# Patient Record
Sex: Male | Born: 1955 | Race: White | Hispanic: No | Smoking: Former smoker
Health system: Southern US, Community
[De-identification: ages and names within clinical notes are randomized; demographics above are authoritative.]

## PROBLEM LIST (undated history)

## (undated) DIAGNOSIS — F1020 Alcohol dependence, uncomplicated: Secondary | ICD-10-CM

## (undated) DIAGNOSIS — R0600 Dyspnea, unspecified: Secondary | ICD-10-CM

## (undated) DIAGNOSIS — J189 Pneumonia, unspecified organism: Secondary | ICD-10-CM

## (undated) DIAGNOSIS — I10 Essential (primary) hypertension: Secondary | ICD-10-CM

## (undated) DIAGNOSIS — J9811 Atelectasis: Secondary | ICD-10-CM

## (undated) DIAGNOSIS — J449 Chronic obstructive pulmonary disease, unspecified: Secondary | ICD-10-CM

## (undated) DIAGNOSIS — Z972 Presence of dental prosthetic device (complete) (partial): Secondary | ICD-10-CM

## (undated) DIAGNOSIS — D649 Anemia, unspecified: Secondary | ICD-10-CM

## (undated) DIAGNOSIS — R590 Localized enlarged lymph nodes: Secondary | ICD-10-CM

## (undated) DIAGNOSIS — F32A Depression, unspecified: Secondary | ICD-10-CM

## (undated) DIAGNOSIS — C801 Malignant (primary) neoplasm, unspecified: Secondary | ICD-10-CM

## (undated) DIAGNOSIS — A150 Tuberculosis of lung: Secondary | ICD-10-CM

## (undated) DIAGNOSIS — Z8489 Family history of other specified conditions: Secondary | ICD-10-CM

## (undated) HISTORY — PX: MULTIPLE TOOTH EXTRACTIONS: SHX2053

---

## 2010-08-19 ENCOUNTER — Encounter: Payer: Self-pay | Admitting: Gastroenterology

## 2010-11-29 NOTE — Letter (Signed)
Summary: TRIAGE  TRIAGE   Imported By: Rexene Alberts 08/19/2010 10:04:21  _____________________________________________________________________  External Attachment:    Type:   Image     Comment:   External Document

## 2014-09-30 ENCOUNTER — Emergency Department (HOSPITAL_COMMUNITY): Payer: Medicare Other

## 2014-09-30 ENCOUNTER — Encounter (HOSPITAL_COMMUNITY): Payer: Self-pay

## 2014-09-30 ENCOUNTER — Emergency Department (HOSPITAL_COMMUNITY)
Admission: EM | Admit: 2014-09-30 | Discharge: 2014-09-30 | Disposition: A | Discharge status: Home / Self Care | Payer: Medicare Other | Attending: Emergency Medicine | Admitting: Emergency Medicine

## 2014-09-30 DIAGNOSIS — Z720 Tobacco use: Secondary | ICD-10-CM | POA: Diagnosis not present

## 2014-09-30 DIAGNOSIS — R55 Syncope and collapse: Secondary | ICD-10-CM | POA: Diagnosis not present

## 2014-09-30 DIAGNOSIS — J441 Chronic obstructive pulmonary disease with (acute) exacerbation: Secondary | ICD-10-CM | POA: Diagnosis not present

## 2014-09-30 DIAGNOSIS — R109 Unspecified abdominal pain: Secondary | ICD-10-CM | POA: Insufficient documentation

## 2014-09-30 DIAGNOSIS — R05 Cough: Secondary | ICD-10-CM

## 2014-09-30 DIAGNOSIS — R054 Cough syncope: Secondary | ICD-10-CM

## 2014-09-30 DIAGNOSIS — Z8611 Personal history of tuberculosis: Secondary | ICD-10-CM | POA: Diagnosis not present

## 2014-09-30 DIAGNOSIS — I10 Essential (primary) hypertension: Secondary | ICD-10-CM | POA: Diagnosis not present

## 2014-09-30 DIAGNOSIS — K625 Hemorrhage of anus and rectum: Secondary | ICD-10-CM | POA: Diagnosis not present

## 2014-09-30 HISTORY — DX: Tuberculosis of lung: A15.0

## 2014-09-30 HISTORY — DX: Essential (primary) hypertension: I10

## 2014-09-30 HISTORY — DX: Chronic obstructive pulmonary disease, unspecified: J44.9

## 2014-09-30 LAB — CBC WITH DIFFERENTIAL/PLATELET
BASOS ABS: 0 10*3/uL (ref 0.0–0.1)
Basophils Relative: 1 % (ref 0–1)
EOS PCT: 1 % (ref 0–5)
Eosinophils Absolute: 0.1 10*3/uL (ref 0.0–0.7)
HEMATOCRIT: 42.4 % (ref 39.0–52.0)
HEMOGLOBIN: 15.4 g/dL (ref 13.0–17.0)
Lymphocytes Relative: 42 % (ref 12–46)
Lymphs Abs: 3.5 10*3/uL (ref 0.7–4.0)
MCH: 34.8 pg — ABNORMAL HIGH (ref 26.0–34.0)
MCHC: 36.3 g/dL — AB (ref 30.0–36.0)
MCV: 95.7 fL (ref 78.0–100.0)
MONO ABS: 0.8 10*3/uL (ref 0.1–1.0)
MONOS PCT: 10 % (ref 3–12)
NEUTROS ABS: 3.9 10*3/uL (ref 1.7–7.7)
Neutrophils Relative %: 46 % (ref 43–77)
Platelets: 141 10*3/uL — ABNORMAL LOW (ref 150–400)
RBC: 4.43 MIL/uL (ref 4.22–5.81)
RDW: 12.1 % (ref 11.5–15.5)
WBC: 8.3 10*3/uL (ref 4.0–10.5)

## 2014-09-30 LAB — URINALYSIS, ROUTINE W REFLEX MICROSCOPIC
Bilirubin Urine: NEGATIVE
Glucose, UA: NEGATIVE mg/dL
Hgb urine dipstick: NEGATIVE
KETONES UR: NEGATIVE mg/dL
LEUKOCYTES UA: NEGATIVE
NITRITE: NEGATIVE
Protein, ur: NEGATIVE mg/dL
Specific Gravity, Urine: <1.005 — ABNORMAL LOW (ref 1.005–1.030)
UROBILINOGEN UA: 0.2 mg/dL (ref 0.0–1.0)
pH: 5.5 (ref 5.0–8.0)

## 2014-09-30 LAB — COMPREHENSIVE METABOLIC PANEL
ALBUMIN: 3.7 g/dL (ref 3.5–5.2)
ALT: 36 U/L (ref 0–53)
ANION GAP: 16 — AB (ref 5–15)
AST: 63 U/L — ABNORMAL HIGH (ref 0–37)
Alkaline Phosphatase: 92 U/L (ref 39–117)
BILIRUBIN TOTAL: 0.4 mg/dL (ref 0.3–1.2)
BUN: 4 mg/dL — AB (ref 6–23)
CALCIUM: 9 mg/dL (ref 8.4–10.5)
CHLORIDE: 89 meq/L — AB (ref 96–112)
CO2: 23 mEq/L (ref 19–32)
CREATININE: 0.83 mg/dL (ref 0.50–1.35)
GFR calc Af Amer: >90 mL/min (ref >90)
GFR calc non Af Amer: >90 mL/min (ref >90)
Glucose, Bld: 125 mg/dL — ABNORMAL HIGH (ref 70–99)
Potassium: 3 mEq/L — ABNORMAL LOW (ref 3.7–5.3)
Sodium: 128 mEq/L — ABNORMAL LOW (ref 137–147)
Total Protein: 7.5 g/dL (ref 6.0–8.3)

## 2014-09-30 LAB — LACTIC ACID, PLASMA: Lactic Acid, Venous: 3.3 mmol/L — ABNORMAL HIGH (ref 0.5–2.2)

## 2014-09-30 LAB — TROPONIN I

## 2014-09-30 MED ORDER — ALBUTEROL SULFATE (2.5 MG/3ML) 0.083% IN NEBU
5.0000 mg | INHALATION_SOLUTION | Freq: Once | RESPIRATORY_TRACT | Status: AC
Start: 2014-09-30 — End: 2014-09-30
  Administered 2014-09-30: 5 mg via RESPIRATORY_TRACT
  Filled 2014-09-30: qty 6

## 2014-09-30 MED ORDER — PREDNISONE 50 MG PO TABS
60.0000 mg | ORAL_TABLET | ORAL | Status: AC
  Administered 2014-09-30: 60 mg via ORAL
  Filled 2014-09-30 (×2): qty 1

## 2014-09-30 MED ORDER — SODIUM CHLORIDE 0.9 % IV SOLN
1000.0000 mL | INTRAVENOUS | Status: DC
  Administered 2014-09-30: 1000 mL via INTRAVENOUS

## 2014-09-30 MED ORDER — PREDNISONE 20 MG PO TABS: 60.0000 mg | ORAL_TABLET | Freq: Every day | ORAL | Status: AC

## 2014-09-30 NOTE — ED Notes (Signed)
He was at the kitchen table, and he tried to stand up and he blacked out per family. We took him to [facility] and it was a long wait, they did do an EKG because he was having chest pain and it was fine. He is also having blood in his stool per family.

## 2014-09-30 NOTE — ED Provider Notes (Signed)
CSN: [id number]     Arrival date & time 09/30/14  1918 History  This chart was scribed for Carmin Muskrat, MD by Tula Nakayama, ED Scribe. This patient was seen in room APA16A/APA16A and the patient's care was started at 8:00 PM.    Chief Complaint  Patient presents with  . Chest Pain  . Loss of Consciousness   Patient is a 58 y.o. male presenting with syncope. The history is provided by the patient. No language interpreter was used.  Loss of Consciousness Associated symptoms: no nausea and no vomiting     HPI Comments: Ronald [name] is a 58 y.o. male who presents to the Emergency Department complaining of chest pain, syncope, abdominal pain, rectal bleeding. Patient has multiple medical issues, including history of TB in the distant past, COPD, current smoking, substantial current alcohol use. He states over the past 5 months or so, he has had multiple episodes of sustained coughing, occasionally resulting with syncope. The patient had a similar episode, with no pain either before or after the event, but with chest pain after an additional coughing episode that occurred after losing consciousness. Currently the patient has no pain, coughing, lightheadedness, nausea. He does complain of suprapubic pain. Patient notes over the past few days he has had suprapubic discomfort, sore, moderate.  In addition, the patient has new bright red blood per rectum, with bowel movements, but not independently. No rectal pain, no vomiting. I discussed smoking cessation with the patient.    Past Medical History  Diagnosis Date  . COPD (chronic obstructive pulmonary disease)   . Hypertension   . TB (pulmonary tuberculosis)    No past surgical history on file. No family history on file. History  Substance Use Topics  . Smoking status: Current Every Day Smoker  . Smokeless tobacco: Not on file  . Alcohol Use: Yes    Review of Systems  Constitutional:       Per HPI, otherwise negative  HENT:        Per HPI, otherwise negative  Respiratory:       Per HPI, otherwise negative  Cardiovascular: Positive for syncope.       Per HPI, otherwise negative  Gastrointestinal: Positive for abdominal pain and anal bleeding. Negative for nausea and vomiting.  Endocrine:       Negative aside from HPI  Genitourinary:       Neg aside from HPI   Musculoskeletal:       Per HPI, otherwise negative  Skin: Negative.   Neurological: Positive for syncope.      Allergies  Review of patient's allergies indicates no known allergies.  Home Medications   Prior to Admission medications   Not on File   BP 106/49 mmHg  Pulse 90  Temp(Src) 97.7 F (36.5 C) (Oral)  Resp 20  Ht 5\' 4"  (1.626 m)  Wt 155 lb (70.308 kg)  BMI 26.59 kg/m2  SpO2 100% Physical Exam  Constitutional: He is oriented to person, place, and time. He appears well-developed.  Large, generally ill-appearing male  HENT:  Head: Normocephalic and atraumatic.  Eyes: Conjunctivae and EOM are normal.  Cardiovascular: Normal rate and regular rhythm.   Pulmonary/Chest: No stridor. Tachypnea noted. No respiratory distress. He has decreased breath sounds. He has wheezes.  Abdominal: He exhibits no distension. There is no hepatosplenomegaly. There is tenderness in the suprapubic area. There is guarding. There is no rigidity.  Musculoskeletal: He exhibits no edema.  Neurological: He is alert and oriented to  person, place, and time.  Skin: Skin is warm and dry.  Psychiatric: He has a normal mood and affect.  Nursing note and vitals reviewed.   ED Course  Procedures (including critical care time)  COORDINATION OF CARE: 8:00 PM Discussed treatment plan with pt at bedside and pt agreed to plan.    Labs Review Labs Reviewed  CBC WITH DIFFERENTIAL - Abnormal; Notable for the following:    MCH 34.8 (*)    MCHC 36.3 (*)    Platelets 141 (*)    All other components within normal limits  COMPREHENSIVE METABOLIC PANEL - Abnormal;  Notable for the following:    Sodium 128 (*)    Potassium 3.0 (*)    Chloride 89 (*)    Glucose, Bld 125 (*)    BUN 4 (*)    AST 63 (*)    Anion gap 16 (*)    All other components within normal limits  URINALYSIS, ROUTINE W REFLEX MICROSCOPIC - Abnormal; Notable for the following:    Specific Gravity, Urine <1.005 (*)    All other components within normal limits  LACTIC ACID, PLASMA - Abnormal; Notable for the following:    Lactic Acid, Venous 3.3 (*)    All other components within normal limits  TROPONIN I    Imaging Review Ct Abdomen Pelvis Wo Contrast  09/30/2014   CLINICAL DATA:  Acute onset of lower abdominal pain and suprapubic pain. Bright red blood per rectum. Syncope. Initial encounter.  EXAM: CT ABDOMEN AND PELVIS WITHOUT CONTRAST  TECHNIQUE: Multidetector CT imaging of the abdomen and pelvis was performed following the standard protocol without IV contrast.  COMPARISON:  None.  FINDINGS: A tiny 4 mm nodule is noted at the right lung base (image 9 of 21).  There is diffuse fatty infiltration within the liver. The spleen is unremarkable in appearance. The gallbladder is grossly unremarkable. The pancreas and adrenal glands are normal in appearance.  Nonspecific perinephric stranding is noted bilaterally. A 2.9 cm cyst is noted near the lower pole of the left kidney. There is no evidence of hydronephrosis. No renal or ureteral stones are seen.  No free fluid is identified. The small bowel is unremarkable in appearance. The stomach is within normal limits. No acute vascular abnormalities are seen. Scattered calcification is seen along the abdominal aorta and its branches.  The appendix is normal in caliber and contains air, without evidence for appendicitis. Contrast progresses to the level of the mid transverse colon. The colon is otherwise largely decompressed and unremarkable in appearance.  The bladder is moderately distended and grossly unremarkable. The prostate remains normal in  size, with scattered calcification. No inguinal lymphadenopathy is seen.  No acute osseous abnormalities are identified.  IMPRESSION: 1. No definite acute abnormality seen to explain the patient's symptoms. The colon is largely decompressed and grossly unremarkable in appearance. 2. Nonspecific perinephric stranding noted bilaterally. Depending on the patient's symptoms, pyelonephritis could have such an appearance, or this could reflect chronic change. 3. Diffuse fatty infiltration within the liver. 4. Scattered calcification along the abdominal aorta and its branches. 5. Tiny 4 mm nodule at the right lung base. If the patient is at high risk for bronchogenic carcinoma, follow-up chest CT at 1 year is recommended. If the patient is at low risk, no follow-up is needed. This recommendation follows the consensus statement: Guidelines for Management of Small Pulmonary Nodules Detected on CT Scans: A Statement from the [employer] as published in Radiology 2005; 237:395-400.  Electronically Signed   By: Garald Balding M.D.   On: 09/30/2014 22:39   Dg Chest 2 View  09/30/2014   CLINICAL DATA:  Chest pain, syncope  EXAM: CHEST  2 VIEW  COMPARISON:  06/24/2013  FINDINGS: Cardiomediastinal silhouette is stable. No acute infiltrate or pleural effusion. No pulmonary edema. Hyperinflation again noted. Bilateral nodular nipple shadow again noted.  IMPRESSION: No active cardiopulmonary disease.  Hyperinflation.   Electronically Signed   By: Lahoma Crocker M.D.   On: 09/30/2014 21:28     EKG Interpretation   Date/Time:  Wednesday September 30 2014 19:33:13 EST Ventricular Rate:  88 PR Interval:  216 QRS Duration: 100 QT Interval:  376 QTC Calculation: 454 R Axis:   88 Text Interpretation:  Sinus rhythm with 1st degree A-V block prominent p  waves Abnormal ekg Sinus rhythm with 1st degree A-V block Otherwise normal  ECG Confirmed by Carmin Muskrat  MD ([id number]) on 09/30/2014 8:10:20 PM     Pulse ox symmetry  96% with room air, borderline   11:01 PM Patient awake and alert, feeling better. He is aware of all results, and will call PMD tomorrow.  MDM   Final diagnoses:  Abdominal pain  syncope Cough  Patient presents after an episode of cough associated syncope. Notably, the patient has had multiple similar events for months Patient continues to smoke, in spite of his history of COPD. Patient's labs are largely reassuring, with no evidence of coronary ischemia, nor arrhythmia. Patient does describe some rectal bleeding, though has minimal abdominal pain, and CT scan is largely reassuring. With no anemia, and substantial improvement with fluid resuscitation, patient was discharged to follow up with his primary care team.   I personally performed the services described in this documentation, which was scribed in my presence. The recorded information has been reviewed and is accurate.      Carmin Muskrat, MD 09/30/14 2303

## 2014-09-30 NOTE — Discharge Instructions (Signed)
As discussed, it is important that you follow up as soon as possible with your physician for continued management of your condition. ° °If you develop any new, or concerning changes in your condition, please return to the emergency department immediately. ° °

## 2014-09-30 NOTE — ED Notes (Signed)
Patient states he had "a coughing spell" tonight and became dizzy and passed out. Patient also states rectal bleeding, nausea and vomiting, and lower abdominal pain.

## 2014-09-30 NOTE — ED Notes (Signed)
Patient and family verbalize understanding of discharge instructions, prescription medications, home care and follow up care. Patient ambulatory out of department at this time with children.

## 2018-09-19 ENCOUNTER — Emergency Department (HOSPITAL_COMMUNITY): Payer: Medicare Other

## 2018-09-19 ENCOUNTER — Inpatient Hospital Stay (HOSPITAL_COMMUNITY)
Admission: EM | Admit: 2018-09-19 | Discharge: 2018-09-24 | DRG: 871 | Disposition: A | Discharge status: Home Health | Payer: Medicare Other | Attending: Internal Medicine | Admitting: Internal Medicine

## 2018-09-19 ENCOUNTER — Encounter (HOSPITAL_COMMUNITY): Payer: Self-pay | Admitting: Emergency Medicine

## 2018-09-19 ENCOUNTER — Other Ambulatory Visit: Payer: Self-pay

## 2018-09-19 DIAGNOSIS — R918 Other nonspecific abnormal finding of lung field: Secondary | ICD-10-CM | POA: Diagnosis not present

## 2018-09-19 DIAGNOSIS — J189 Pneumonia, unspecified organism: Secondary | ICD-10-CM | POA: Diagnosis not present

## 2018-09-19 DIAGNOSIS — E871 Hypo-osmolality and hyponatremia: Secondary | ICD-10-CM | POA: Diagnosis present

## 2018-09-19 DIAGNOSIS — Z8249 Family history of ischemic heart disease and other diseases of the circulatory system: Secondary | ICD-10-CM

## 2018-09-19 DIAGNOSIS — Z8041 Family history of malignant neoplasm of ovary: Secondary | ICD-10-CM | POA: Diagnosis not present

## 2018-09-19 DIAGNOSIS — Z23 Encounter for immunization: Secondary | ICD-10-CM | POA: Diagnosis present

## 2018-09-19 DIAGNOSIS — R627 Adult failure to thrive: Secondary | ICD-10-CM | POA: Diagnosis present

## 2018-09-19 DIAGNOSIS — R109 Unspecified abdominal pain: Secondary | ICD-10-CM

## 2018-09-19 DIAGNOSIS — E876 Hypokalemia: Secondary | ICD-10-CM | POA: Diagnosis not present

## 2018-09-19 DIAGNOSIS — J181 Lobar pneumonia, unspecified organism: Secondary | ICD-10-CM

## 2018-09-19 DIAGNOSIS — C3432 Malignant neoplasm of lower lobe, left bronchus or lung: Secondary | ICD-10-CM | POA: Diagnosis present

## 2018-09-19 DIAGNOSIS — Z993 Dependence on wheelchair: Secondary | ICD-10-CM | POA: Diagnosis not present

## 2018-09-19 DIAGNOSIS — D649 Anemia, unspecified: Secondary | ICD-10-CM | POA: Diagnosis present

## 2018-09-19 DIAGNOSIS — D638 Anemia in other chronic diseases classified elsewhere: Secondary | ICD-10-CM | POA: Diagnosis present

## 2018-09-19 DIAGNOSIS — R0602 Shortness of breath: Secondary | ICD-10-CM | POA: Diagnosis not present

## 2018-09-19 DIAGNOSIS — E43 Unspecified severe protein-calorie malnutrition: Secondary | ICD-10-CM

## 2018-09-19 DIAGNOSIS — I5032 Chronic diastolic (congestive) heart failure: Secondary | ICD-10-CM | POA: Diagnosis present

## 2018-09-19 DIAGNOSIS — K5900 Constipation, unspecified: Secondary | ICD-10-CM | POA: Diagnosis not present

## 2018-09-19 DIAGNOSIS — Z7289 Other problems related to lifestyle: Secondary | ICD-10-CM | POA: Diagnosis not present

## 2018-09-19 DIAGNOSIS — F1020 Alcohol dependence, uncomplicated: Secondary | ICD-10-CM | POA: Diagnosis present

## 2018-09-19 DIAGNOSIS — I110 Hypertensive heart disease with heart failure: Secondary | ICD-10-CM | POA: Diagnosis present

## 2018-09-19 DIAGNOSIS — Z8611 Personal history of tuberculosis: Secondary | ICD-10-CM | POA: Diagnosis not present

## 2018-09-19 DIAGNOSIS — J449 Chronic obstructive pulmonary disease, unspecified: Secondary | ICD-10-CM | POA: Diagnosis present

## 2018-09-19 DIAGNOSIS — R6521 Severe sepsis with septic shock: Secondary | ICD-10-CM | POA: Diagnosis present

## 2018-09-19 DIAGNOSIS — Z6821 Body mass index (BMI) 21.0-21.9, adult: Secondary | ICD-10-CM | POA: Diagnosis not present

## 2018-09-19 DIAGNOSIS — I951 Orthostatic hypotension: Secondary | ICD-10-CM | POA: Diagnosis not present

## 2018-09-19 DIAGNOSIS — R634 Abnormal weight loss: Secondary | ICD-10-CM | POA: Diagnosis not present

## 2018-09-19 DIAGNOSIS — R945 Abnormal results of liver function studies: Secondary | ICD-10-CM | POA: Diagnosis present

## 2018-09-19 DIAGNOSIS — R9431 Abnormal electrocardiogram [ECG] [EKG]: Secondary | ICD-10-CM | POA: Diagnosis present

## 2018-09-19 DIAGNOSIS — E222 Syndrome of inappropriate secretion of antidiuretic hormone: Secondary | ICD-10-CM | POA: Diagnosis present

## 2018-09-19 DIAGNOSIS — I10 Essential (primary) hypertension: Secondary | ICD-10-CM | POA: Diagnosis present

## 2018-09-19 DIAGNOSIS — F17210 Nicotine dependence, cigarettes, uncomplicated: Secondary | ICD-10-CM | POA: Diagnosis present

## 2018-09-19 DIAGNOSIS — J9811 Atelectasis: Secondary | ICD-10-CM | POA: Diagnosis not present

## 2018-09-19 DIAGNOSIS — A419 Sepsis, unspecified organism: Principal | ICD-10-CM | POA: Diagnosis present

## 2018-09-19 DIAGNOSIS — Z681 Body mass index (BMI) 19 or less, adult: Secondary | ICD-10-CM | POA: Diagnosis not present

## 2018-09-19 DIAGNOSIS — F17200 Nicotine dependence, unspecified, uncomplicated: Secondary | ICD-10-CM | POA: Diagnosis not present

## 2018-09-19 DIAGNOSIS — R7989 Other specified abnormal findings of blood chemistry: Secondary | ICD-10-CM | POA: Diagnosis present

## 2018-09-19 DIAGNOSIS — I2510 Atherosclerotic heart disease of native coronary artery without angina pectoris: Secondary | ICD-10-CM

## 2018-09-19 DIAGNOSIS — R531 Weakness: Secondary | ICD-10-CM | POA: Diagnosis present

## 2018-09-19 DIAGNOSIS — J440 Chronic obstructive pulmonary disease with acute lower respiratory infection: Secondary | ICD-10-CM | POA: Diagnosis present

## 2018-09-19 HISTORY — DX: Alcohol dependence, uncomplicated: F10.20

## 2018-09-19 LAB — CBC WITH DIFFERENTIAL/PLATELET
Abs Immature Granulocytes: 0.28 10*3/uL — ABNORMAL HIGH (ref 0.00–0.07)
BASOS ABS: 0.1 10*3/uL (ref 0.0–0.1)
BASOS PCT: 0 %
EOS ABS: 0 10*3/uL (ref 0.0–0.5)
Eosinophils Relative: 0 %
HCT: 36.2 % — ABNORMAL LOW (ref 39.0–52.0)
Hemoglobin: 12.1 g/dL — ABNORMAL LOW (ref 13.0–17.0)
IMMATURE GRANULOCYTES: 1 %
Lymphocytes Relative: 4 %
Lymphs Abs: 1 10*3/uL (ref 0.7–4.0)
MCH: 32.1 pg (ref 26.0–34.0)
MCHC: 33.4 g/dL (ref 30.0–36.0)
MCV: 96 fL (ref 80.0–100.0)
Monocytes Absolute: 1.3 10*3/uL — ABNORMAL HIGH (ref 0.1–1.0)
Monocytes Relative: 5 %
NEUTROS PCT: 90 %
Neutro Abs: 23.8 10*3/uL — ABNORMAL HIGH (ref 1.7–7.7)
PLATELETS: 307 10*3/uL (ref 150–400)
RBC: 3.77 MIL/uL — AB (ref 4.22–5.81)
RDW: 12.5 % (ref 11.5–15.5)
WBC: 26.5 10*3/uL — AB (ref 4.0–10.5)
nRBC: 0 % (ref 0.0–0.2)

## 2018-09-19 LAB — URINALYSIS, ROUTINE W REFLEX MICROSCOPIC
Glucose, UA: NEGATIVE mg/dL
HGB URINE DIPSTICK: NEGATIVE
Ketones, ur: NEGATIVE mg/dL
Leukocytes, UA: NEGATIVE
NITRITE: NEGATIVE
PH: 6 (ref 5.0–8.0)
Protein, ur: 30 mg/dL — AB
SPECIFIC GRAVITY, URINE: 1.02 (ref 1.005–1.030)

## 2018-09-19 LAB — COMPREHENSIVE METABOLIC PANEL
ALBUMIN: 2.2 g/dL — AB (ref 3.5–5.0)
ALK PHOS: 124 U/L (ref 38–126)
ALT: 54 U/L — ABNORMAL HIGH (ref 0–44)
ANION GAP: 11 (ref 5–15)
AST: 75 U/L — ABNORMAL HIGH (ref 15–41)
BUN: 8 mg/dL (ref 8–23)
CALCIUM: 8.6 mg/dL — AB (ref 8.9–10.3)
CO2: 26 mmol/L (ref 22–32)
Chloride: 93 mmol/L — ABNORMAL LOW (ref 98–111)
Creatinine, Ser: 0.78 mg/dL (ref 0.61–1.24)
GFR calc Af Amer: >60 mL/min (ref >60)
GFR calc non Af Amer: >60 mL/min (ref >60)
GLUCOSE: 176 mg/dL — AB (ref 70–99)
POTASSIUM: 3.6 mmol/L (ref 3.5–5.1)
Sodium: 130 mmol/L — ABNORMAL LOW (ref 135–145)
TOTAL PROTEIN: 6.9 g/dL (ref 6.5–8.1)
Total Bilirubin: 0.7 mg/dL (ref 0.3–1.2)

## 2018-09-19 LAB — MAGNESIUM: Magnesium: 2 mg/dL (ref 1.7–2.4)

## 2018-09-19 LAB — BRAIN NATRIURETIC PEPTIDE: B NATRIURETIC PEPTIDE 5: 77 pg/mL (ref 0.0–100.0)

## 2018-09-19 LAB — ETHANOL: Alcohol, Ethyl (B): <10 mg/dL (ref <10)

## 2018-09-19 LAB — PHOSPHORUS: PHOSPHORUS: 3.1 mg/dL (ref 2.5–4.6)

## 2018-09-19 LAB — TSH: TSH: 1.488 u[IU]/mL (ref 0.350–4.500)

## 2018-09-19 LAB — LACTIC ACID, PLASMA: Lactic Acid, Venous: 1.1 mmol/L (ref 0.5–1.9)

## 2018-09-19 LAB — LIPASE, BLOOD: Lipase: 25 U/L (ref 11–51)

## 2018-09-19 MED ORDER — ONDANSETRON HCL 4 MG PO TABS
4.0000 mg | ORAL_TABLET | Freq: Four times a day (QID) | ORAL | Status: DC | PRN
  Administered 2018-09-21: 4 mg via ORAL
  Filled 2018-09-19: qty 1

## 2018-09-19 MED ORDER — SODIUM CHLORIDE 0.9 % IV SOLN
INTRAVENOUS | Status: DC
  Administered 2018-09-19 – 2018-09-21 (×4): via INTRAVENOUS

## 2018-09-19 MED ORDER — HEPARIN SODIUM (PORCINE) 5000 UNIT/ML IJ SOLN
5000.0000 [IU] | Freq: Three times a day (TID) | INTRAMUSCULAR | Status: DC
  Administered 2018-09-19 – 2018-09-24 (×14): 5000 [IU] via SUBCUTANEOUS
  Filled 2018-09-19 (×15): qty 1

## 2018-09-19 MED ORDER — SODIUM CHLORIDE 0.9 % IV BOLUS
1000.0000 mL | Freq: Once | INTRAVENOUS | Status: AC
  Administered 2018-09-19: 1000 mL via INTRAVENOUS

## 2018-09-19 MED ORDER — SODIUM CHLORIDE 0.9 % IV SOLN
1.0000 g | INTRAVENOUS | Status: DC
  Administered 2018-09-20 – 2018-09-23 (×4): 1 g via INTRAVENOUS
  Filled 2018-09-19 (×4): qty 10

## 2018-09-19 MED ORDER — SODIUM CHLORIDE 0.9 % IV SOLN
1.0000 g | Freq: Once | INTRAVENOUS | Status: AC
  Administered 2018-09-19: 1 g via INTRAVENOUS
  Filled 2018-09-19: qty 10

## 2018-09-19 MED ORDER — ACETAMINOPHEN 325 MG PO TABS
650.0000 mg | ORAL_TABLET | Freq: Four times a day (QID) | ORAL | Status: DC | PRN
  Administered 2018-09-20: 650 mg via ORAL
  Filled 2018-09-19: qty 2

## 2018-09-19 MED ORDER — POTASSIUM CHLORIDE IN NACL 20-0.9 MEQ/L-% IV SOLN
INTRAVENOUS | Status: DC
  Administered 2018-09-19: 23:00:00 via INTRAVENOUS
  Filled 2018-09-19: qty 1000

## 2018-09-19 MED ORDER — IOHEXOL 300 MG/ML  SOLN
75.0000 mL | Freq: Once | INTRAMUSCULAR | Status: AC | PRN
  Administered 2018-09-19: 75 mL via INTRAVENOUS

## 2018-09-19 MED ORDER — ONDANSETRON HCL 4 MG/2ML IJ SOLN: 4.0000 mg | Freq: Four times a day (QID) | INTRAMUSCULAR | Status: DC | PRN

## 2018-09-19 MED ORDER — OXYCODONE HCL 5 MG PO TABS
5.0000 mg | ORAL_TABLET | Freq: Four times a day (QID) | ORAL | Status: DC | PRN
  Administered 2018-09-20 – 2018-09-21 (×2): 5 mg via ORAL
  Filled 2018-09-19 (×2): qty 1

## 2018-09-19 MED ORDER — SODIUM CHLORIDE 0.9 % IV SOLN
500.0000 mg | Freq: Once | INTRAVENOUS | Status: AC
  Administered 2018-09-19: 500 mg via INTRAVENOUS
  Filled 2018-09-19: qty 500

## 2018-09-19 MED ORDER — ACETAMINOPHEN 650 MG RE SUPP: 650.0000 mg | Freq: Four times a day (QID) | RECTAL | Status: DC | PRN

## 2018-09-19 MED ORDER — SODIUM CHLORIDE 0.9 % IV SOLN
500.0000 mg | INTRAVENOUS | Status: DC
  Administered 2018-09-20 – 2018-09-21 (×2): 500 mg via INTRAVENOUS
  Filled 2018-09-19 (×2): qty 500

## 2018-09-19 NOTE — ED Triage Notes (Signed)
Patient states for the past couple of weeks when he stands up his "feet give out on me and I fall in the floor on my butt."  Patient states his home nurse reported orthostatic hypotension.

## 2018-09-19 NOTE — H&P (Signed)
History and Physical    Ronald [name]:[id number] DOB: 09-Oct-1956 DOA: 09/19/2018  PCP: Patient, No Pcp Per   Patient coming from: Home.  I have personally briefly reviewed patient's old medical records in [place]  Chief Complaint: Weakness and weight loss.  HPI: Ronald [name] is a 62 y.o. male with medical history significant of alcoholism, COPD, hypertension, pulmonary TB, cigarette smoker since his teenage years who is coming to the emergency department with progressively worse weakness and lightheadedness for the past 2 weeks associated with a 30 pound weight loss in the last 2 months.  He complains of fatigue and frequent wheezing.  He has productive cough for yellowish sputum.  He denies fever, chills, sore throat, rhinorrhea or hemoptysis.  He denies chest pain, palpitations, diaphoresis, PND, orthopnea or pitting edema of the lower extremities.  No abdominal pain, nausea, emesis, diarrhea, constipation, melena or hematochezia.  Denies dysuria, frequency or hematuria.  Denies heat or cold intolerance.  Denies polyuria, polydipsia, polyphagia or blurred vision.  ED Course: Initial vital signs temperature 97.6 F, pulse 123, respirations 20, blood pressure 99/55 mmHg and O2 sat 95% on room air.  The patient received supplemental oxygen, 3000 mL of NS bolus, 20 mEq of KCl with 1 of the boluses, ceftriaxone, Zithromax IV.  His urinalysis show an amber color, hazy appearance, small bilirubin, 30 mg/dL proteinuria, rare bacteria and 6-10 WBC per hpf.  White count was 26.5 on his CBC with differential 90% neutrophils, 4% lymphocytes and 5% monocytes.  Hemoglobin was 12.1 g/dL and platelets 307.  BMP, lipase, alcohol level, TSH, magnesium, phosphorus and lactic acid were within normal limits.  His sodium was 130, potassium 3.6, chloride 93 and CO2 26 mmol/L.  BUN 8, creatinine 0.78, calcium 8.6 and glucose 176 mg/dL.  Total protein is 6.9 and albumin 2.2 g/dL.  AST 75 ALT 54.  The rest of  the CMP is within normal limits.  Imaging: Chest radiograph and CT chest with contrast showed obstructing pneumonia and mass on LLL.  Please see images and full radiology report for further detail.  Review of Systems: As per HPI otherwise 10 point review of systems negative.   Past Medical History:  Diagnosis Date  . Alcoholism ([place])   . COPD (chronic obstructive pulmonary disease) ([place])   . Hypertension   . TB (pulmonary tuberculosis)     History reviewed. No pertinent surgical history.   reports that he has been smoking cigarettes. He has been smoking about 0.50 packs per day. He has never used smokeless tobacco. He reports that he drinks about 7.0 standard drinks of alcohol per week. He reports that he does not use drugs.  No Known Allergies  Family History  Problem Relation Age of Onset  . Heart disease Mother   . Heart disease Father   . Diabetes Other   . Hypertension Other    family history is also significant for ovarian cancer on his maternal grandmother and one of his sister.  Prior to Admission medications   Not on File    Physical Exam: Vitals:   09/19/18 1900 09/19/18 1930 09/19/18 2000 09/19/18 2015  BP: (!) 91/57 96/64 98/68    Pulse: 97 100  (!) 105  Resp: (!) 21 (!) 22 (!) 31 (!) 26  Temp:      TempSrc:      SpO2: 98% 98%  97%  Weight:      Height:        Constitutional: Looks chronically  ill.  Cachectic. Eyes: PERRL, lids and conjunctivae are mildly injected. ENMT: Mucous membranes are dry.  Posterior pharynx clear of any exudate or lesions.Normal dentition.  Neck: normal, supple, no masses, no thyromegaly Respiratory: Decreased breath sounds on LLL, mild wheezing bilaterally, LLL crackles. Normal respiratory effort. No accessory muscle use.  Cardiovascular: Tachycardic at 102 bpm, no murmurs / rubs / gallops. No extremity edema. 2+ pedal pulses. No carotid bruits.  Abdomen: Soft, no tenderness, no masses palpated. No hepatosplenomegaly. Bowel  sounds positive.  Musculoskeletal: Mild clubbing, no cyanosis. Good ROM, no contractures. Normal muscle tone.  Skin: no rashes, lesions, ulcers on limited dermatological examination. Neurologic: CN 2-12 grossly intact. Sensation intact, DTR normal. Strength 5/5 in all 4.  Psychiatric:  Alert and oriented x 2, partially oriented to time and situation.  Depressed mood.   Labs on Admission: I have personally reviewed following labs and imaging studies  CBC: Recent Labs  Lab 09/19/18 1624  WBC 26.5*  NEUTROABS 23.8*  HGB 12.1*  HCT 36.2*  MCV 96.0  PLT 096   Basic Metabolic Panel: Recent Labs  Lab 09/19/18 1624  NA 130*  K 3.6  CL 93*  CO2 26  GLUCOSE 176*  BUN 8  CREATININE 0.78  CALCIUM 8.6*   GFR: Estimated Creatinine Clearance: 66.7 mL/min (by C-G formula based on SCr of 0.78 mg/dL). Liver Function Tests: Recent Labs  Lab 09/19/18 1624  AST 75*  ALT 54*  ALKPHOS 124  BILITOT 0.7  PROT 6.9  ALBUMIN 2.2*   Recent Labs  Lab 09/19/18 1624  LIPASE 25   No results for input(s): AMMONIA in the last 168 hours. Coagulation Profile: No results for input(s): INR, PROTIME in the last 168 hours. Cardiac Enzymes: No results for input(s): CKTOTAL, CKMB, CKMBINDEX, TROPONINI in the last 168 hours. BNP (last 3 results) No results for input(s): PROBNP in the last 8760 hours. HbA1C: No results for input(s): HGBA1C in the last 72 hours. CBG: No results for input(s): GLUCAP in the last 168 hours. Lipid Profile: No results for input(s): CHOL, HDL, LDLCALC, TRIG, CHOLHDL, LDLDIRECT in the last 72 hours. Thyroid Function Tests: Recent Labs    09/19/18 1624  TSH 1.488   Anemia Panel: No results for input(s): VITAMINB12, FOLATE, FERRITIN, TIBC, IRON, RETICCTPCT in the last 72 hours. Urine analysis:    Component Value Date/Time   COLORURINE AMBER (A) 09/19/2018 1640   APPEARANCEUR HAZY (A) 09/19/2018 1640   LABSPEC 1.020 09/19/2018 1640   PHURINE 6.0 09/19/2018 1640     GLUCOSEU NEGATIVE 09/19/2018 1640   HGBUR NEGATIVE 09/19/2018 1640   BILIRUBINUR SMALL (A) 09/19/2018 1640   KETONESUR NEGATIVE 09/19/2018 1640   PROTEINUR 30 (A) 09/19/2018 1640   UROBILINOGEN 0.2 09/30/2014 1958   NITRITE NEGATIVE 09/19/2018 1640   LEUKOCYTESUR NEGATIVE 09/19/2018 1640    Radiological Exams on Admission: Dg Chest 2 View  Result Date: 09/19/2018 CLINICAL DATA:  couple of weeks when he stands up his "feet give out on me and I fall ." Patient states his home nurse reported orthostatic hypotension. EXAM: CHEST - 2 VIEW COMPARISON:  09/30/2014 FINDINGS: There is LEFT LOWER lobe collapse. Heart size is normal. No pulmonary edema. RIGHT lung is clear. There is atherosclerotic calcification of the thoracic aorta. IMPRESSION: 1. LEFT LOWER lobe collapse. 2. Consider central obstruction and infection. 3. Followup PA and lateral chest X-ray is recommended in 3-4 weeks following treatment to ensure resolution and exclude underlying malignancy. Electronically Signed   By: Nolon Nations  M.D.   On: 09/19/2018 17:24   Ct Chest W Contrast  Result Date: 09/19/2018 CLINICAL DATA:  Left lower lobe collapse seen on x-ray. Acute respiratory illness. EXAM: CT CHEST WITH CONTRAST TECHNIQUE: Multidetector CT imaging of the chest was performed during intravenous contrast administration. CONTRAST:  10mL OMNIPAQUE IOHEXOL 300 MG/ML  SOLN COMPARISON:  Chest x-ray September 19, 2018 FINDINGS: Cardiovascular: There is atherosclerotic change in the thoracic aorta without dissection. No aneurysm. The heart size is normal. No obvious coronary artery calcifications. The main pulmonary artery is unremarkable. The left lower lobe central pulmonary artery is narrowed over a short distance in the infrahilar region before returning to more normal size. This is well seen on coronal image 80. Mediastinum/Nodes: The thyroid and esophagus are unremarkable. There is a left-sided pleural effusion. No pericardial  effusion or right pleural effusion. Soft tissue fullness in the inferior left hilum suspicious for a centrally obstructing mass. No other adenopathy identified. Lungs/Pleura: There is mucus in the distal trachea and mainstem bronchi. The trachea, mainstem bronchi, right-sided airways, and left upper lobe airways are normal. There is abrupt cut off of the left lower lobe bronchus with complete collapse of the left lower lobe. While it is difficult to discern a discrete mass, I am suspicious the abrupt cut off of the left lower lobe bronchus and left lower lobe collapse may be due to a centrally obstructing mass with soft tissue fullness in the infrahilar region. There is a small left-sided pleural effusion. No right-sided pleural effusion. No pneumothorax. There is a nodule in the right lower lobe on series 4, image 115 measuring 4 mm. No other pulmonary nodules, masses, or infiltrates. Upper Abdomen: No acute abnormality. Musculoskeletal: No chest wall abnormality. No acute or significant osseous findings. IMPRESSION: 1. The findings are concerning for a centrally obstructing mass/malignancy resulting in collapse of the left lower lobe. While a discrete mass cannot be measured, there is soft tissue fullness in the left infrahilar region. There is also abrupt cut off of the left lower lobe bronchus and narrowing of the left lower lobe pulmonary artery as it traverses this region. Recommend pulmonary consultation with consideration of bronchoscopy. 2. Atherosclerotic change in the thoracic aorta. 3. Small left-sided pleural effusion. 4. 4 mm nodule in the right lower lobe, too small to characterize. Aortic Atherosclerosis (ICD10-I70.0). Electronically Signed   By: Dorise Bullion III M.D   On: 09/19/2018 20:57    EKG: Independently reviewed. Vent. rate 115 BPM PR interval * ms QRS duration 86 ms QT/QTc 333/461 ms P-R-T axes 91 98 87 Sinus tachycardia Anterolateral infarct, old Baseline wander in lead(s) I  aVL  Assessment/Plan Principal Problem:   Obstructive pneumonia Admit to [facility]/stepdown. Continue supplemental oxygen. Continue IV fluids. Bronchodilators as needed. Continue IV Zithromax 500 mg IVPB daily. Continue ceftriaxone 1 g IVPB every day. Check sputum Gram stain, culture and sensitivity. Follow-up blood culture and sensitivity. Check strep pneumoniae and Legionella urinary antigen.  Active Problems:   Lung mass To be evaluated by pulmonology.   Check sputum cytology.   Check head CT since he does not seem to be as sharp as usual.  Consider CT abdomen/pelvis in a day or 2, since the patient received contrast for CT chest earlier.    Abnormal ECG/history of CAD Not taking any meds. Check echocardiogram.    COPD (chronic obstructive pulmonary disease) (HCC) Supplemental oxygen and bronchodilators as needed.    Abnormal LFTs Likely due to alcohol abuse. However, there is a possibility of  metastatic disease from lung mass.    Hyponatremia This could be secondary to poor oral intake. However, monitor sodium level closely due to lung mass related SIADH or the possibility of incipient Legionella infection, which I doubt.    Hypertension Currently hypotensive. Monitor blood pressure.    Alcoholism [facility]) Per patient and his sister, he is only drinking 1 beer a day.  He used to drink a lot more until about a month ago.  He denies history of DTs or prior detox admissions to the hospital.    Anemia Check anemia panel. Monitor hematocrit and hemoglobin.     DVT prophylaxis:  Heparin SQ. Code Status: Full code. Family Communication: His sister was present in the ED room. Disposition Plan: Admit for pneumonia and lung mass treatment/further evaluation. Consults called: PCCM (Dr. Lucile Shutters).  He was added to the dayshift consult list. Admission status: Inpatient/stepdown.   Reubin Milan MD [facility] Pager [phone].  If 7PM-7AM, please contact  night-coverage [email or web address] Password [email or web address]  09/19/2018, 10:08 PM

## 2018-09-19 NOTE — ED Notes (Signed)
Pt returned from xray

## 2018-09-19 NOTE — ED Provider Notes (Signed)
[facility] EMERGENCY DEPARTMENT Provider Note   CSN: [id number] Arrival date & time: 09/19/18  1455     History   Chief Complaint Chief Complaint  Patient presents with  . Weakness    HPI NELS MUNN is a 62 y.o. male.  Patient with a history of COPD.  Brought in for not feeling well for the past 2 weeks.  Feeling lightheaded feeling as if maybe he is going to pass out.  Patient states at home nurse noted he had orthostatic hypotension.  Family states that he has had significant weight loss since August.  Patient's vital signs upon arrival were significant for heart rate of 329 initial systolic blood pressure was 99/55.  Patient is still a smoker.  Patient with complaint of congestion and cough.  Questionable fever.     Past Medical History:  Diagnosis Date  . Alcoholism ([place])   . COPD (chronic obstructive pulmonary disease) ([place])   . Hypertension   . TB (pulmonary tuberculosis)     Patient Active Problem List   Diagnosis Date Noted  . Obstructive pneumonia 09/19/2018  . Abnormal LFTs 09/19/2018  . Hyponatremia 09/19/2018  . COPD (chronic obstructive pulmonary disease) ([place]) 09/19/2018  . Hypertension 09/19/2018  . Alcoholism ([place]) 09/19/2018  . Anemia 09/19/2018  . Lung mass 09/19/2018    History reviewed. No pertinent surgical history.      Home Medications    Prior to Admission medications   Not on File    Family History Family History  Problem Relation Age of Onset  . Heart disease Mother   . Heart disease Father   . Diabetes Other   . Hypertension Other     Social History Social History   Tobacco Use  . Smoking status: Current Every Day Smoker    Packs/day: 0.50    Types: Cigarettes  . Smokeless tobacco: Never Used  Substance Use Topics  . Alcohol use: Yes    Alcohol/week: 7.0 standard drinks    Types: 7 Cans of beer per week  . Drug use: No     Allergies   Patient has no known allergies.   Review of Systems Review of  Systems  Constitutional: Positive for appetite change, fever and unexpected weight change.  HENT: Positive for congestion.   Eyes: Negative for redness.  Respiratory: Positive for cough and shortness of breath.   Cardiovascular: Negative for chest pain.  Gastrointestinal: Negative for abdominal pain, nausea and vomiting.  Genitourinary: Negative for dysuria and hematuria.  Musculoskeletal: Negative for back pain.  Skin: Negative for rash.  Neurological: Positive for light-headedness. Negative for syncope.  Hematological: Does not bruise/bleed easily.  Psychiatric/Behavioral: Negative for confusion.     Physical Exam Updated Vital Signs BP (!) 83/57   Pulse 100   Temp 97.6 F (36.4 C) (Oral)   Resp (!) 24   Ht 1.626 m (5\' 4" )   Wt 49.9 kg   SpO2 97%   BMI 18.88 kg/m   Physical Exam  Constitutional: He is oriented to person, place, and time. He appears well-developed and well-nourished. No distress.  HENT:  Head: Normocephalic and atraumatic.  Mucous membranes dry.  Eyes: Pupils are equal, round, and reactive to light. Conjunctivae and EOM are normal.  Neck: Neck supple.  Cardiovascular: Regular rhythm and normal heart sounds.  Tachycardic  Pulmonary/Chest: Effort normal. He has no wheezes. He has rales.  Abdominal: Soft. Bowel sounds are normal. There is no tenderness.  Musculoskeletal: Normal range of motion. He exhibits  no edema.  Neurological: He is alert and oriented to person, place, and time. No cranial nerve deficit or sensory deficit. He exhibits normal muscle tone. Coordination normal.  Skin: Skin is warm.  Nursing note and vitals reviewed.    ED Treatments / Results  Labs (all labs ordered are listed, but only abnormal results are displayed) Labs Reviewed  COMPREHENSIVE METABOLIC PANEL - Abnormal; Notable for the following components:      Result Value   Sodium 130 (*)    Chloride 93 (*)    Glucose, Bld 176 (*)    Calcium 8.6 (*)    Albumin 2.2 (*)      AST 75 (*)    ALT 54 (*)    All other components within normal limits  CBC WITH DIFFERENTIAL/PLATELET - Abnormal; Notable for the following components:   WBC 26.5 (*)    RBC 3.77 (*)    Hemoglobin 12.1 (*)    HCT 36.2 (*)    Neutro Abs 23.8 (*)    Monocytes Absolute 1.3 (*)    Abs Immature Granulocytes 0.28 (*)    All other components within normal limits  URINALYSIS, ROUTINE W REFLEX MICROSCOPIC - Abnormal; Notable for the following components:   Color, Urine AMBER (*)    APPearance HAZY (*)    Bilirubin Urine SMALL (*)    Protein, ur 30 (*)    Bacteria, UA RARE (*)    All other components within normal limits  CULTURE, BLOOD (ROUTINE X 2)  CULTURE, BLOOD (ROUTINE X 2)  EXPECTORATED SPUTUM ASSESSMENT W REFEX TO RESP CULTURE  GRAM STAIN  LIPASE, BLOOD  ETHANOL  BRAIN NATRIURETIC PEPTIDE  TSH  LACTIC ACID, PLASMA  MAGNESIUM  PHOSPHORUS  HIV ANTIBODY (ROUTINE TESTING W REFLEX)  STREP PNEUMONIAE URINARY ANTIGEN  CBC WITH DIFFERENTIAL/PLATELET  COMPREHENSIVE METABOLIC PANEL    EKG EKG Interpretation  Date/Time:  Thursday September 19 2018 16:15:41 EST Ventricular Rate:  115 PR Interval:    QRS Duration: 86 QT Interval:  333 QTC Calculation: 461 R Axis:   98 Text Interpretation:  Sinus tachycardia Anterolateral infarct, old Baseline wander in lead(s) I aVL Confirmed by Fredia Sorrow [phone]) on 09/19/2018 4:28:26 PM   Radiology Dg Chest 2 View  Result Date: 09/19/2018 CLINICAL DATA:  couple of weeks when he stands up his "feet give out on me and I fall ." Patient states his home nurse reported orthostatic hypotension. EXAM: CHEST - 2 VIEW COMPARISON:  09/30/2014 FINDINGS: There is LEFT LOWER lobe collapse. Heart size is normal. No pulmonary edema. RIGHT lung is clear. There is atherosclerotic calcification of the thoracic aorta. IMPRESSION: 1. LEFT LOWER lobe collapse. 2. Consider central obstruction and infection. 3. Followup PA and lateral chest X-ray is  recommended in 3-4 weeks following treatment to ensure resolution and exclude underlying malignancy. Electronically Signed   By: Nolon Nations M.D.   On: 09/19/2018 17:24   Ct Chest W Contrast  Result Date: 09/19/2018 CLINICAL DATA:  Left lower lobe collapse seen on x-ray. Acute respiratory illness. EXAM: CT CHEST WITH CONTRAST TECHNIQUE: Multidetector CT imaging of the chest was performed during intravenous contrast administration. CONTRAST:  47mL OMNIPAQUE IOHEXOL 300 MG/ML  SOLN COMPARISON:  Chest x-ray September 19, 2018 FINDINGS: Cardiovascular: There is atherosclerotic change in the thoracic aorta without dissection. No aneurysm. The heart size is normal. No obvious coronary artery calcifications. The main pulmonary artery is unremarkable. The left lower lobe central pulmonary artery is narrowed over a short distance in the  infrahilar region before returning to more normal size. This is well seen on coronal image 80. Mediastinum/Nodes: The thyroid and esophagus are unremarkable. There is a left-sided pleural effusion. No pericardial effusion or right pleural effusion. Soft tissue fullness in the inferior left hilum suspicious for a centrally obstructing mass. No other adenopathy identified. Lungs/Pleura: There is mucus in the distal trachea and mainstem bronchi. The trachea, mainstem bronchi, right-sided airways, and left upper lobe airways are normal. There is abrupt cut off of the left lower lobe bronchus with complete collapse of the left lower lobe. While it is difficult to discern a discrete mass, I am suspicious the abrupt cut off of the left lower lobe bronchus and left lower lobe collapse may be due to a centrally obstructing mass with soft tissue fullness in the infrahilar region. There is a small left-sided pleural effusion. No right-sided pleural effusion. No pneumothorax. There is a nodule in the right lower lobe on series 4, image 115 measuring 4 mm. No other pulmonary nodules, masses, or  infiltrates. Upper Abdomen: No acute abnormality. Musculoskeletal: No chest wall abnormality. No acute or significant osseous findings. IMPRESSION: 1. The findings are concerning for a centrally obstructing mass/malignancy resulting in collapse of the left lower lobe. While a discrete mass cannot be measured, there is soft tissue fullness in the left infrahilar region. There is also abrupt cut off of the left lower lobe bronchus and narrowing of the left lower lobe pulmonary artery as it traverses this region. Recommend pulmonary consultation with consideration of bronchoscopy. 2. Atherosclerotic change in the thoracic aorta. 3. Small left-sided pleural effusion. 4. 4 mm nodule in the right lower lobe, too small to characterize. Aortic Atherosclerosis (ICD10-I70.0). Electronically Signed   By: Dorise Bullion III M.D   On: 09/19/2018 20:57    Procedures Procedures (including critical care time)  Medications Ordered in ED Medications  0.9 %  sodium chloride infusion ( Intravenous New Bag/Given 09/19/18 1950)  azithromycin (ZITHROMAX) 500 mg in sodium chloride 0.9 % 250 mL IVPB (500 mg Intravenous New Bag/Given 09/19/18 2345)  0.9 % NaCl with KCl 20 mEq/ L  infusion ( Intravenous New Bag/Given 09/19/18 2251)  cefTRIAXone (ROCEPHIN) 1 g in sodium chloride 0.9 % 100 mL IVPB (has no administration in time range)  azithromycin (ZITHROMAX) 500 mg in sodium chloride 0.9 % 250 mL IVPB (has no administration in time range)  heparin injection 5,000 Units (5,000 Units Subcutaneous Given 09/19/18 2241)  acetaminophen (TYLENOL) tablet 650 mg (has no administration in time range)    Or  acetaminophen (TYLENOL) suppository 650 mg (has no administration in time range)  oxyCODONE (Oxy IR/ROXICODONE) immediate release tablet 5 mg (has no administration in time range)  ondansetron (ZOFRAN) tablet 4 mg (has no administration in time range)    Or  ondansetron (ZOFRAN) injection 4 mg (has no administration in time  range)  sodium chloride 0.9 % bolus 1,000 mL (0 mLs Intravenous Stopped 09/19/18 1950)  iohexol (OMNIPAQUE) 300 MG/ML solution 75 mL (75 mLs Intravenous Contrast Given 09/19/18 2004)  cefTRIAXone (ROCEPHIN) 1 g in sodium chloride 0.9 % 100 mL IVPB (0 g Intravenous Stopped 09/19/18 2342)     Initial Impression / Assessment and Plan / ED Course  I have reviewed the triage vital signs and the nursing notes.  Pertinent labs & imaging results that were available during my care of the patient were reviewed by me and considered in my medical decision making (see chart for details).    Patient ill-appearing  and with a history of the weight loss and a heavy smoker concern was for either pneumonia or perhaps lung mass.  Patient's blood pressures in the emergency department were soft.  Anywhere from 77-41 systolic.  Remained with some baseline tachycardia.  No documented fever.  Chest x-ray raise concerns for infiltrate or mass left lower lobe.  So CT chest was done which confirmed left lower lobe mass.  Patient had marked leukocytosis.  So it could be a post obstructive infectious process.  For this patient was treated with Rocephin and Zithromax as if it could be a community-acquired pneumonia.  Differential on the white blood cell count was normal distribution for infection.  No concerns for blood-borne neoplastic process.  Family and patient informed of the findings.  Patient will need continued fluid hydration and admission for evaluation and staging of the lung mass.  And continue antibiotics.  Lactic acid was ordered blood cultures were ordered.  Patient based on his vital signs did not meet septic criteria.   Final Clinical Impressions(s) / ED Diagnoses   Final diagnoses:  Mass of lower lobe of left lung  Community acquired pneumonia of left lower lobe of lung [facility])    ED Discharge Orders    None       Fredia Sorrow, MD 09/20/18 [phone]

## 2018-09-19 NOTE — ED Notes (Signed)
Patient transported to CT 

## 2018-09-20 ENCOUNTER — Other Ambulatory Visit: Payer: Self-pay

## 2018-09-20 ENCOUNTER — Encounter (HOSPITAL_COMMUNITY): Payer: Self-pay

## 2018-09-20 ENCOUNTER — Inpatient Hospital Stay (HOSPITAL_COMMUNITY): Payer: Medicare Other

## 2018-09-20 ENCOUNTER — Other Ambulatory Visit (HOSPITAL_COMMUNITY): Payer: Medicare Other

## 2018-09-20 DIAGNOSIS — R918 Other nonspecific abnormal finding of lung field: Secondary | ICD-10-CM

## 2018-09-20 DIAGNOSIS — R9431 Abnormal electrocardiogram [ECG] [EKG]: Secondary | ICD-10-CM | POA: Diagnosis present

## 2018-09-20 DIAGNOSIS — I2510 Atherosclerotic heart disease of native coronary artery without angina pectoris: Secondary | ICD-10-CM

## 2018-09-20 LAB — COMPREHENSIVE METABOLIC PANEL
ALBUMIN: 1.5 g/dL — AB (ref 3.5–5.0)
ALT: 38 U/L (ref 0–44)
AST: 54 U/L — ABNORMAL HIGH (ref 15–41)
Alkaline Phosphatase: 99 U/L (ref 38–126)
Anion gap: 4 — ABNORMAL LOW (ref 5–15)
BILIRUBIN TOTAL: 0.4 mg/dL (ref 0.3–1.2)
BUN: 5 mg/dL — AB (ref 8–23)
CHLORIDE: 109 mmol/L (ref 98–111)
CO2: 19 mmol/L — AB (ref 22–32)
Calcium: 7.2 mg/dL — ABNORMAL LOW (ref 8.9–10.3)
Creatinine, Ser: 0.57 mg/dL — ABNORMAL LOW (ref 0.61–1.24)
GFR calc Af Amer: >60 mL/min (ref >60)
GFR calc non Af Amer: >60 mL/min (ref >60)
GLUCOSE: 95 mg/dL (ref 70–99)
POTASSIUM: 3.2 mmol/L — AB (ref 3.5–5.1)
SODIUM: 132 mmol/L — AB (ref 135–145)
Total Protein: 5.2 g/dL — ABNORMAL LOW (ref 6.5–8.1)

## 2018-09-20 LAB — CBC WITH DIFFERENTIAL/PLATELET
Abs Immature Granulocytes: 0.07 10*3/uL (ref 0.00–0.07)
BASOS ABS: 0.1 10*3/uL (ref 0.0–0.1)
BASOS PCT: 0 %
EOS ABS: 0.1 10*3/uL (ref 0.0–0.5)
EOS PCT: 0 %
HEMATOCRIT: 31.7 % — AB (ref 39.0–52.0)
Hemoglobin: 10.4 g/dL — ABNORMAL LOW (ref 13.0–17.0)
IMMATURE GRANULOCYTES: 1 %
Lymphocytes Relative: 14 %
Lymphs Abs: 1.7 10*3/uL (ref 0.7–4.0)
MCH: 31.7 pg (ref 26.0–34.0)
MCHC: 32.8 g/dL (ref 30.0–36.0)
MCV: 96.6 fL (ref 80.0–100.0)
Monocytes Absolute: 1 10*3/uL (ref 0.1–1.0)
Monocytes Relative: 8 %
NEUTROS PCT: 77 %
NRBC: 0 % (ref 0.0–0.2)
Neutro Abs: 9.6 10*3/uL — ABNORMAL HIGH (ref 1.7–7.7)
PLATELETS: 271 10*3/uL (ref 150–400)
RBC: 3.28 MIL/uL — ABNORMAL LOW (ref 4.22–5.81)
RDW: 12.6 % (ref 11.5–15.5)
WBC: 12.4 10*3/uL — AB (ref 4.0–10.5)

## 2018-09-20 LAB — ABO/RH: ABO/RH(D): A POS

## 2018-09-20 LAB — PROCALCITONIN: Procalcitonin: 0.21 ng/mL

## 2018-09-20 LAB — FIBRINOGEN: Fibrinogen: 570 mg/dL — ABNORMAL HIGH (ref 210–475)

## 2018-09-20 LAB — PROTIME-INR
INR: 1.33
PROTHROMBIN TIME: 16.3 s — AB (ref 11.4–15.2)

## 2018-09-20 LAB — TYPE AND SCREEN
ABO/RH(D): A POS
Antibody Screen: NEGATIVE

## 2018-09-20 LAB — LACTIC ACID, PLASMA
Lactic Acid, Venous: 0.8 mmol/L (ref 0.5–1.9)
Lactic Acid, Venous: 0.8 mmol/L (ref 0.5–1.9)

## 2018-09-20 LAB — APTT: APTT: 33 s (ref 24–36)

## 2018-09-20 LAB — CORTISOL: CORTISOL PLASMA: 19.5 ug/dL

## 2018-09-20 LAB — TROPONIN I

## 2018-09-20 LAB — MRSA PCR SCREENING: MRSA BY PCR: NEGATIVE

## 2018-09-20 MED ORDER — BENZONATATE 100 MG PO CAPS
100.0000 mg | ORAL_CAPSULE | Freq: Two times a day (BID) | ORAL | Status: DC
  Administered 2018-09-20 – 2018-09-24 (×9): 100 mg via ORAL
  Filled 2018-09-20 (×9): qty 1

## 2018-09-20 MED ORDER — POTASSIUM CHLORIDE 10 MEQ/100ML IV SOLN
10.0000 meq | INTRAVENOUS | Status: AC
  Administered 2018-09-20 (×4): 10 meq via INTRAVENOUS
  Filled 2018-09-20 (×4): qty 100

## 2018-09-20 MED ORDER — ALBUMIN HUMAN 5 % IV SOLN
12.5000 g | Freq: Once | INTRAVENOUS | Status: DC
  Filled 2018-09-20: qty 250

## 2018-09-20 MED ORDER — METRONIDAZOLE IN NACL 5-0.79 MG/ML-% IV SOLN
500.0000 mg | Freq: Three times a day (TID) | INTRAVENOUS | Status: DC
  Administered 2018-09-20 – 2018-09-22 (×6): 500 mg via INTRAVENOUS
  Filled 2018-09-20 (×6): qty 100

## 2018-09-20 MED ORDER — MAGNESIUM SULFATE 2 GM/50ML IV SOLN
2.0000 g | Freq: Once | INTRAVENOUS | Status: AC
  Administered 2018-09-20: 2 g via INTRAVENOUS
  Filled 2018-09-20: qty 50

## 2018-09-20 MED ORDER — NICOTINE 21 MG/24HR TD PT24
21.0000 mg | MEDICATED_PATCH | Freq: Every day | TRANSDERMAL | Status: DC
  Administered 2018-09-20 – 2018-09-24 (×5): 21 mg via TRANSDERMAL
  Filled 2018-09-20 (×5): qty 1

## 2018-09-20 MED ORDER — ENSURE ENLIVE PO LIQD
237.0000 mL | Freq: Two times a day (BID) | ORAL | Status: DC
  Administered 2018-09-20 – 2018-09-24 (×7): 237 mL via ORAL

## 2018-09-20 MED ORDER — SODIUM CHLORIDE 0.9 % IV BOLUS
1000.0000 mL | Freq: Once | INTRAVENOUS | Status: AC
  Administered 2018-09-20: 1000 mL via INTRAVENOUS

## 2018-09-20 MED ORDER — LORAZEPAM 2 MG/ML IJ SOLN: 2.0000 mg | INTRAMUSCULAR | Status: DC | PRN

## 2018-09-20 MED ORDER — FOLIC ACID 1 MG PO TABS
1.0000 mg | ORAL_TABLET | Freq: Every day | ORAL | Status: DC
  Administered 2018-09-20 – 2018-09-24 (×5): 1 mg via ORAL
  Filled 2018-09-20 (×5): qty 1

## 2018-09-20 MED ORDER — VITAMIN B-1 100 MG PO TABS
100.0000 mg | ORAL_TABLET | Freq: Every day | ORAL | Status: DC
  Administered 2018-09-20 – 2018-09-24 (×5): 100 mg via ORAL
  Filled 2018-09-20 (×5): qty 1

## 2018-09-20 NOTE — Consult Note (Addendum)
NAME:  Ronald [name], MRN:  [id number], DOB:  1955-12-11, LOS: 1 ADMISSION DATE:  09/19/2018, CONSULTATION DATE:  09/20/2018  REFERRING MD:  [facility] MD, CHIEF COMPLAINT:  Lung mass   Brief History   Smoker LLL lung mass consult  History of present illness   -year-old male with a very poor historian, smoker, significant alcoholic.  He he states he lives in [place] and is on disability because his mother died and he had significant grief reaction.  He also says that he has 7 siblings who can help him out.  According to his history he has had weakness in his lower extremities and generalized weakness and therefore he got admitted.  According to the history of present illness noticed by the hospitalist on the 21st of #2019 he is a 30 pound weight loss in the last 2 months also has wheezing and cough with yellow sputum but no hemoptysis or any GI symptoms.  He had a CT scan of the chest that shows left lower lobe collapse and an obstructive pattern raising the question of endobronchial lesion and surrounding hilar adenopathy and concerning for malignancy.  There is a remote history of tolerated with close and not otherwise specified  Past Medical History     has a past medical history of Alcoholism ([place]), COPD (chronic obstructive pulmonary disease) ([place]), Hypertension, and TB (pulmonary tuberculosis).   reports that he has been smoking cigarettes. He has been smoking about 2.00 packs per day. He has never used smokeless tobacco.  History reviewed. No pertinent surgical history.  No Known Allergies   There is no immunization history on file for this patient.  Family History  Problem Relation Age of Onset  . Heart disease Mother   . Heart disease Father   . Diabetes Other   . Hypertension Other   . Ovarian cancer Maternal Grandmother      Current Facility-Administered Medications:  .  0.9 %  sodium chloride infusion, , Intravenous, Continuous, Lavina Hamman, MD, Last Rate: 150  mL/hr at 09/20/18 1121 .  acetaminophen (TYLENOL) tablet 650 mg, 650 mg, Oral, Q6H PRN **OR** acetaminophen (TYLENOL) suppository 650 mg, 650 mg, Rectal, Q6H PRN, Reubin Milan, MD .  albumin human 5 % solution 12.5 g, 12.5 g, Intravenous, Once, Lavina Hamman, MD .  azithromycin (ZITHROMAX) 500 mg in sodium chloride 0.9 % 250 mL IVPB, 500 mg, Intravenous, Q24H, Reubin Milan, MD .  benzonatate (TESSALON) capsule 100 mg, 100 mg, Oral, BID, Lavina Hamman, MD, 100 mg at 09/20/18 1216 .  cefTRIAXone (ROCEPHIN) 1 g in sodium chloride 0.9 % 100 mL IVPB, 1 g, Intravenous, Q24H, Reubin Milan, MD .  feeding supplement (ENSURE ENLIVE) (ENSURE ENLIVE) liquid 237 mL, 237 mL, Oral, BID BM, Lavina Hamman, MD, 237 mL at 09/20/18 1536 .  heparin injection 5,000 Units, 5,000 Units, Subcutaneous, Q8H, Reubin Milan, MD, 5,000 Units at 09/20/18 1216 .  nicotine (NICODERM CQ - dosed in mg/24 hours) patch 21 mg, 21 mg, Transdermal, Daily, Lavina Hamman, MD, 21 mg at 09/20/18 1118 .  ondansetron (ZOFRAN) tablet 4 mg, 4 mg, Oral, Q6H PRN **OR** ondansetron (ZOFRAN) injection 4 mg, 4 mg, Intravenous, Q6H PRN, Reubin Milan, MD .  oxyCODONE (Oxy IR/ROXICODONE) immediate release tablet 5 mg, 5 mg, Oral, Q6H PRN, Reubin Milan, MD .  potassium chloride 10 mEq in 100 mL IVPB, 10 mEq, Intravenous, Q1 Hr x 4, Lavina Hamman, MD, Last Rate: 100  mL/hr at 09/20/18 1536, 10 mEq at 09/20/18 [facility] Events   09/19/2018  - admit 09/20/18 - consult  Consults:  11/22 - pulm consult  Procedures:  x  Significant Diagnostic Tests:  x  Micro Data:  x  Antimicrobials:  x   Interim history/subjective:  x  Objective   Blood pressure (!) 87/55, pulse 74, temperature 97.7 F (36.5 C), temperature source Oral, resp. rate 20, height 5\' 4"  (1.626 m), weight 54.8 kg, SpO2 96 %.        Intake/Output Summary (Last 24 hours) at 09/20/2018 1542 Last data filed at  09/20/2018 1019 Gross per 24 hour  Intake 6623.34 ml  Output 500 ml  Net 6123.34 ml   Filed Weights   09/19/18 1459 09/20/18 0144  Weight: 49.9 kg 54.8 kg    Examination: General Appearance:  Looks thin emaciated male Head:  Normocephalic, without obvious abnormality, atraumatic Eyes:  PERRL -iyes, conjunctiva/corneas -clear     Ears:  Normal external ear canals, both ears Nose:  G tube -no Throat:  ETT TUBE -no, OG tube -no Neck:  Supple,  No enlargement/tenderness/nodules Lungs: Clear to auscultation bilaterally,  Heart:  S1 and S2 normal, no murmur, CVP -no.  Pressors -no Abdomen:  Soft, no masses, no organomegaly Genitalia / Rectal:  Not done Extremities:  Extremities-intact Skin:  ntact in exposed areas . Sacral area -denies any sacral decub Neurologic:  Sedation -none-> RASS -+1. Moves all 4s -yes. CAM-ICU -negative. Orientation -x3+ but poor historian   LABS    PULMONARY No results for input(s): PHART, PCO2ART, PO2ART, HCO3, TCO2, O2SAT in the last 168 hours.  Invalid input(s): PCO2, PO2  CBC Recent Labs  Lab 09/19/18 1624 09/20/18 1012  HGB 12.1* 10.4*  HCT 36.2* 31.7*  WBC 26.5* 12.4*  PLT 307 271    COAGULATION Recent Labs  Lab 09/20/18 1012  INR 1.33    CARDIAC   Recent Labs  Lab 09/20/18 1012  TROPONINI <0.03   No results for input(s): PROBNP in the last 168 hours.   CHEMISTRY Recent Labs  Lab 09/19/18 1624 09/20/18 1012  NA 130* 132*  K 3.6 3.2*  CL 93* 109  CO2 26 19*  GLUCOSE 176* 95  BUN 8 5*  CREATININE 0.78 0.57*  CALCIUM 8.6* 7.2*  MG 2.0  --   PHOS 3.1  --    Estimated Creatinine Clearance: 73.3 mL/min (A) (by C-G formula based on SCr of 0.57 mg/dL (L)).   LIVER Recent Labs  Lab 09/19/18 1624 09/20/18 1012  AST 75* 54*  ALT 54* 38  ALKPHOS 124 99  BILITOT 0.7 0.4  PROT 6.9 5.2*  ALBUMIN 2.2* 1.5*  INR  --  1.33     INFECTIOUS Recent Labs  Lab 09/19/18 2200 09/20/18 1012 09/20/18 1238    LATICACIDVEN 1.1 0.8 0.8  PROCALCITON  --  0.21  --      ENDOCRINE CBG (last 3)  No results for input(s): GLUCAP in the last 72 hours.       IMAGING x48h  - image(s) personally visualized  -   highlighted in bold Dg Chest 2 View  Result Date: 09/19/2018 CLINICAL DATA:  couple of weeks when he stands up his "feet give out on me and I fall ." Patient states his home nurse reported orthostatic hypotension. EXAM: CHEST - 2 VIEW COMPARISON:  09/30/2014 FINDINGS: There is LEFT LOWER lobe collapse. Heart size is normal. No pulmonary edema. RIGHT lung is clear.  There is atherosclerotic calcification of the thoracic aorta. IMPRESSION: 1. LEFT LOWER lobe collapse. 2. Consider central obstruction and infection. 3. Followup PA and lateral chest X-ray is recommended in 3-4 weeks following treatment to ensure resolution and exclude underlying malignancy. Electronically Signed   By: Nolon Nations M.D.   On: 09/19/2018 17:24   Ct Head Wo Contrast  Result Date: 09/20/2018 CLINICAL DATA:  Small cell lung cancer. EXAM: CT HEAD WITHOUT CONTRAST TECHNIQUE: Contiguous axial images were obtained from the base of the skull through the vertex without intravenous contrast. COMPARISON:  None. FINDINGS: Brain: There is atrophy and chronic small vessel disease changes. No acute intracranial abnormality. Specifically, no hemorrhage, hydrocephalus, mass lesion, acute infarction, or significant intracranial injury. Vascular: No hyperdense vessel or unexpected calcification. Skull: No acute calvarial abnormality. Sinuses/Orbits: Visualized paranasal sinuses and mastoids clear. Orbital soft tissues unremarkable. Other: None IMPRESSION: No acute intracranial abnormality. Atrophy, chronic microvascular disease. Electronically Signed   By: Rolm Baptise M.D.   On: 09/20/2018 08:56   Ct Chest W Contrast  Result Date: 09/19/2018 CLINICAL DATA:  Left lower lobe collapse seen on x-ray. Acute respiratory illness. EXAM: CT  CHEST WITH CONTRAST TECHNIQUE: Multidetector CT imaging of the chest was performed during intravenous contrast administration. CONTRAST:  82mL OMNIPAQUE IOHEXOL 300 MG/ML  SOLN COMPARISON:  Chest x-ray September 19, 2018 FINDINGS: Cardiovascular: There is atherosclerotic change in the thoracic aorta without dissection. No aneurysm. The heart size is normal. No obvious coronary artery calcifications. The main pulmonary artery is unremarkable. The left lower lobe central pulmonary artery is narrowed over a short distance in the infrahilar region before returning to more normal size. This is well seen on coronal image 80. Mediastinum/Nodes: The thyroid and esophagus are unremarkable. There is a left-sided pleural effusion. No pericardial effusion or right pleural effusion. Soft tissue fullness in the inferior left hilum suspicious for a centrally obstructing mass. No other adenopathy identified. Lungs/Pleura: There is mucus in the distal trachea and mainstem bronchi. The trachea, mainstem bronchi, right-sided airways, and left upper lobe airways are normal. There is abrupt cut off of the left lower lobe bronchus with complete collapse of the left lower lobe. While it is difficult to discern a discrete mass, I am suspicious the abrupt cut off of the left lower lobe bronchus and left lower lobe collapse may be due to a centrally obstructing mass with soft tissue fullness in the infrahilar region. There is a small left-sided pleural effusion. No right-sided pleural effusion. No pneumothorax. There is a nodule in the right lower lobe on series 4, image 115 measuring 4 mm. No other pulmonary nodules, masses, or infiltrates. Upper Abdomen: No acute abnormality. Musculoskeletal: No chest wall abnormality. No acute or significant osseous findings. IMPRESSION: 1. The findings are concerning for a centrally obstructing mass/malignancy resulting in collapse of the left lower lobe. While a discrete mass cannot be measured, there is  soft tissue fullness in the left infrahilar region. There is also abrupt cut off of the left lower lobe bronchus and narrowing of the left lower lobe pulmonary artery as it traverses this region. Recommend pulmonary consultation with consideration of bronchoscopy. 2. Atherosclerotic change in the thoracic aorta. 3. Small left-sided pleural effusion. 4. 4 mm nodule in the right lower lobe, too small to characterize. Aortic Atherosclerosis (ICD10-I70.0). Electronically Signed   By: Dorise Bullion III M.D   On: 09/19/2018 20:57       Resolved Hospital Problem list   x  Assessment & Plan:  Left lower lobe lung mass in a smoker with collapse  -Suspect lung cancer.  Does not look like tuberculosis.  This can be had a mostly as an outpatient -[facility] hospitalist team to look at swallowing evaluation -Full pulmonary function test -Outpatient PET scan [is because PET scan along the alert by insurance to be done as an inpatient] =-Outpatient pulmonary follow-up with Dr. Leory Plowman Icard or Dr. Baltazar Apo - check Quant gold  Best practice:  Diet: According to hospitalist MD Pain/Anxiety/Delirium protocol (if indicated): Per hospitalist MD VAP protocol (if indicated): Not applicable DVT prophylaxis: Per hospitalist MD GI prophylaxis: Per hospitalist MD Glucose control: Per hospitalist MD Mobility: Per hospitalist MD Code Status: Full code Family Communication: Open and gave to the patient.  [place]  He has 7 siblings.  He says that he can make it office visit. Disposition: me surg      SIGNATURE    Dr. Brand Males, M.D., F.C.C.P,  Pulmonary and Critical Care Medicine Staff Physician, [place] Director - Interstitial Lung Disease  Program  [facility] [place], [place], [address]  Pager: [phone], If no answer or between  15:00h - 7:00h: call [phone] Telephone: [phone]  3:52  PM 09/20/2018

## 2018-09-20 NOTE — Progress Notes (Signed)
Initial Nutrition Assessment  DOCUMENTATION CODES:   Not applicable  INTERVENTION:    Ensure Enlive po BID, each supplement provides 350 kcal and 20 grams of protein  NUTRITION DIAGNOSIS:   Increased nutrient needs related to acute illness, chronic illness as evidenced by estimated needs  GOAL:   Patient will meet greater than or equal to 90% of their needs  MONITOR:   PO intake, Supplement acceptance, Labs, Weight trends, Skin, I & O's  REASON FOR ASSESSMENT:   Malnutrition Screening Tool  ASSESSMENT:    62 y.o. Male with PMH significant of alcoholism, COPD, hypertension, pulmonary TB, cigarette smoker since his teenage years who is coming to the ED with progressively worse weakness and lightheadedness and unintentional weight loss.   Admitting diagnoses: Mass of lower lobe of left lung [R91.8] Community acquired pneumonia of left lower lobe of lung ([place]) [J18.1]  Pt not feeling well upon RD visit. He has a emesis bag next to him on his bed. Asked RD to contact his daughter to obtain nutrition history.  RD called pt's daughter, however, received no answer. Per nutrition screen pt has been eating poorly bc of a decreased appetite. Per H&P he's had a 30 lb weight loss in the last 2 month period. Severe for time frame.  Labs reviewed. Na 132 (L). K 3.2 (L). Medications reviewed.  Noted possibility of metastatic disease from lung mass.  NUTRITION - FOCUSED PHYSICAL EXAM:  Deferred at this time. Suspect possible malnutrition.  Diet Order:   Diet Order            Diet regular Room service appropriate? Yes; Fluid consistency: Thin  Diet effective now             EDUCATION NEEDS:   Not appropriate for education at this time  Skin:  Skin Assessment: Reviewed RN Assessment  Last BM:  11/22  Height:   Ht Readings from Last 1 Encounters:  09/20/18 5\' 4"  (1.626 m)   Weight:   Wt Readings from Last 1 Encounters:  09/20/18 54.8 kg   BMI:  Body mass  index is 20.74 kg/m.  Estimated Nutritional Needs:   Kcal:  1800-2000  Protein:  90-105 gm  Fluid:  1.8-2.0 L  Arthur Holms, RD, LDN Pager #: [phone] After-Hours Pager #: [phone]

## 2018-09-20 NOTE — Progress Notes (Signed)
[facility] Progress Note  Patient: Ronald [name] OVZ:[id number]   PCP: Patient, No Pcp Per DOB: [birth year]   DOA: 09/19/2018   DOS: 09/20/2018   Date of Service: the patient was seen and examined on 09/20/2018  Brief hospital course: Pt. with PMH of smoker, alcohol abuse; admitted on 09/19/2018, presented with complaint of shortness of breath and cough, was found to have left lower lobe lung mass, possible obstructive pneumonia, septic shock. Currently further plan is continue treating septic shock, further work-up per pulmonary.  Subjective: Feeling better but still has shortness of breath.  Still has cough.  No chest pain no abdominal pain.  No nausea no vomiting no fever no chills.  Drinks 18 cans of beer a day.  Assessment and Plan: Septic shock  secondary to Obstructive pneumonia Continue supplemental oxygen. Received 4 liters of fluid so far, continue IV fluids.  Give IV albumin since the patient is not responding to IV fluids.  May require pressors CCM is already consulted. Bronchodilators as needed. Continue IV Zithromax 500 mg IVPB daily. Continue ceftriaxone 1 g IVPB every day.  Will add Flagyl, low threshold to broaden the coverage. Check sputum Gram stain, culture and sensitivity. Follow-up blood culture and sensitivity. Check strep pneumoniae and Legionella urinary antigen. I will add chest PT with vest due to concern for left lung collapse secondary to mucous plugging.  Active Problems:   Lung mass To be evaluated by pulmonology.   Check sputum cytology.   No plan from pulmonary for now for further work-up based on their note.    Abnormal ECG/history of CAD Not taking any meds. Check echocardiogram.    COPD (chronic obstructive pulmonary disease) (HCC) Supplemental oxygen and bronchodilators as needed.    Abnormal LFTs Likely due to alcohol abuse. However, there is a possibility of metastatic disease from lung mass.    Hyponatremia This could be  secondary to poor oral intake. However, monitor sodium level closely    Hypertension Currently hypotensive. Monitor blood pressure.    Alcoholism [facility])  family and the patient initially told that he was only drinking 1 beer a day, but now the that he is drinking 18 beers a day.  He used to drink a lot more until about a month ago.  He denies history of DTs or prior detox admissions to the hospital. Continue CIWA protocol.  Anemia likely of chronic disease and nutritional deficiency  We will get further work-up.     Component Value Date/Time   HGB 10.4 (L) 09/20/2018 1012   HCT 31.7 (L) 09/20/2018 1012   Nutrition Problem: Increased nutrient needs Etiology: acute illness, chronic illness Interventions: Interventions: Ensure Enlive (each supplement provides 350kcal and 20 grams of protein)  Diet: regular diet DVT Prophylaxis: subcutaneous Heparin  Advance goals of care discussion: full code  Family Communication: family was present at bedside, at the time of interview. The pt provided permission to discuss medical plan with the family. Opportunity was given to ask question and all questions were answered satisfactorily.   Disposition:  Discharge to be determined.  Consultants: PCCM  Procedures: Echocardiogram  Scheduled Meds: . benzonatate  100 mg Oral BID  . feeding supplement (ENSURE ENLIVE)  237 mL Oral BID BM  . folic acid  1 mg Oral Daily  . heparin  5,000 Units Subcutaneous Q8H  . nicotine  21 mg Transdermal Daily  . thiamine  100 mg Oral Daily   Continuous Infusions: . sodium chloride 150 mL/hr at 09/20/18 1121  .  albumin human    . azithromycin    . cefTRIAXone (ROCEPHIN)  IV    . potassium chloride 10 mEq (09/20/18 1536)   PRN Meds: acetaminophen **OR** acetaminophen, LORazepam, ondansetron **OR** ondansetron (ZOFRAN) IV, oxyCODONE Antibiotics: Anti-infectives (From admission, onward)   Start     Dose/Rate Route Frequency Ordered Stop   09/20/18 2100   cefTRIAXone (ROCEPHIN) 1 g in sodium chloride 0.9 % 100 mL IVPB     1 g 200 mL/hr over 30 Minutes Intravenous Every 24 hours 09/19/18 2201 09/27/18 2059   09/20/18 2100  azithromycin (ZITHROMAX) 500 mg in sodium chloride 0.9 % 250 mL IVPB     500 mg 250 mL/hr over 60 Minutes Intravenous Every 24 hours 09/19/18 2201 09/27/18 2059   09/19/18 2145  cefTRIAXone (ROCEPHIN) 1 g in sodium chloride 0.9 % 100 mL IVPB     1 g 200 mL/hr over 30 Minutes Intravenous  Once 09/19/18 2142 09/19/18 2342   09/19/18 2145  azithromycin (ZITHROMAX) 500 mg in sodium chloride 0.9 % 250 mL IVPB     500 mg 250 mL/hr over 60 Minutes Intravenous  Once 09/19/18 2142 09/20/18 0045       Objective: Physical Exam: Vitals:   09/20/18 0344 09/20/18 0732 09/20/18 0815 09/20/18 1121  BP: (!) 94/57 (!) 85/51 (!) 86/54 (!) 87/55  Pulse: 83 76 79 74  Resp: (!) 21  (!) 23 20  Temp: 98 F (36.7 C) (!) 97.5 F (36.4 C) 97.7 F (36.5 C) 97.7 F (36.5 C)  TempSrc: Oral  Oral Oral  SpO2: 94% (!) 79% 95% 96%  Weight:      Height:        Intake/Output Summary (Last 24 hours) at 09/20/2018 1614 Last data filed at 09/20/2018 1019 Gross per 24 hour  Intake 6623.34 ml  Output 500 ml  Net 6123.34 ml   Filed Weights   09/19/18 1459 09/20/18 0144  Weight: 49.9 kg 54.8 kg   General: Alert, Awake and Oriented to Time, Place and Person. Appear in marked distress, affect flat Eyes: PERRL, Conjunctiva normal ENT: Oral Mucosa clear moist. Neck: difficult to assess  JVD, no Abnormal Mass Or lumps Cardiovascular: S1 and S2 Present, no Murmur, Peripheral Pulses Present Respiratory: increased respiratory effort, Bilateral Air entry equal and Decreased, no use of accessory muscle, bilateral Crackles, no wheezes Abdomen: Bowel Sound present, Soft and no tenderness, no hernia Skin: no redness, no Rash, no induration Extremities: no Pedal edema, no calf tenderness Neurologic: Grossly no focal neuro deficit. Bilaterally Equal  motor strength  Data Reviewed: CBC: Recent Labs  Lab 09/19/18 1624 09/20/18 1012  WBC 26.5* 12.4*  NEUTROABS 23.8* 9.6*  HGB 12.1* 10.4*  HCT 36.2* 31.7*  MCV 96.0 96.6  PLT 307 329   Basic Metabolic Panel: Recent Labs  Lab 09/19/18 1624 09/20/18 1012  NA 130* 132*  K 3.6 3.2*  CL 93* 109  CO2 26 19*  GLUCOSE 176* 95  BUN 8 5*  CREATININE 0.78 0.57*  CALCIUM 8.6* 7.2*  MG 2.0  --   PHOS 3.1  --     Liver Function Tests: Recent Labs  Lab 09/19/18 1624 09/20/18 1012  AST 75* 54*  ALT 54* 38  ALKPHOS 124 99  BILITOT 0.7 0.4  PROT 6.9 5.2*  ALBUMIN 2.2* 1.5*   Recent Labs  Lab 09/19/18 1624  LIPASE 25   No results for input(s): AMMONIA in the last 168 hours. Coagulation Profile: Recent Labs  Lab 09/20/18 1012  INR 1.33   Cardiac Enzymes: Recent Labs  Lab 09/20/18 1012  TROPONINI <0.03   BNP (last 3 results) No results for input(s): PROBNP in the last 8760 hours. CBG: No results for input(s): GLUCAP in the last 168 hours. Studies: Dg Chest 2 View  Result Date: 09/19/2018 CLINICAL DATA:  couple of weeks when he stands up his "feet give out on me and I fall ." Patient states his home nurse reported orthostatic hypotension. EXAM: CHEST - 2 VIEW COMPARISON:  09/30/2014 FINDINGS: There is LEFT LOWER lobe collapse. Heart size is normal. No pulmonary edema. RIGHT lung is clear. There is atherosclerotic calcification of the thoracic aorta. IMPRESSION: 1. LEFT LOWER lobe collapse. 2. Consider central obstruction and infection. 3. Followup PA and lateral chest X-ray is recommended in 3-4 weeks following treatment to ensure resolution and exclude underlying malignancy. Electronically Signed   By: Nolon Nations M.D.   On: 09/19/2018 17:24   Ct Head Wo Contrast  Result Date: 09/20/2018 CLINICAL DATA:  Small cell lung cancer. EXAM: CT HEAD WITHOUT CONTRAST TECHNIQUE: Contiguous axial images were obtained from the base of the skull through the vertex without  intravenous contrast. COMPARISON:  None. FINDINGS: Brain: There is atrophy and chronic small vessel disease changes. No acute intracranial abnormality. Specifically, no hemorrhage, hydrocephalus, mass lesion, acute infarction, or significant intracranial injury. Vascular: No hyperdense vessel or unexpected calcification. Skull: No acute calvarial abnormality. Sinuses/Orbits: Visualized paranasal sinuses and mastoids clear. Orbital soft tissues unremarkable. Other: None IMPRESSION: No acute intracranial abnormality. Atrophy, chronic microvascular disease. Electronically Signed   By: Rolm Baptise M.D.   On: 09/20/2018 08:56   Ct Chest W Contrast  Result Date: 09/19/2018 CLINICAL DATA:  Left lower lobe collapse seen on x-ray. Acute respiratory illness. EXAM: CT CHEST WITH CONTRAST TECHNIQUE: Multidetector CT imaging of the chest was performed during intravenous contrast administration. CONTRAST:  45mL OMNIPAQUE IOHEXOL 300 MG/ML  SOLN COMPARISON:  Chest x-ray September 19, 2018 FINDINGS: Cardiovascular: There is atherosclerotic change in the thoracic aorta without dissection. No aneurysm. The heart size is normal. No obvious coronary artery calcifications. The main pulmonary artery is unremarkable. The left lower lobe central pulmonary artery is narrowed over a short distance in the infrahilar region before returning to more normal size. This is well seen on coronal image 80. Mediastinum/Nodes: The thyroid and esophagus are unremarkable. There is a left-sided pleural effusion. No pericardial effusion or right pleural effusion. Soft tissue fullness in the inferior left hilum suspicious for a centrally obstructing mass. No other adenopathy identified. Lungs/Pleura: There is mucus in the distal trachea and mainstem bronchi. The trachea, mainstem bronchi, right-sided airways, and left upper lobe airways are normal. There is abrupt cut off of the left lower lobe bronchus with complete collapse of the left lower lobe.  While it is difficult to discern a discrete mass, I am suspicious the abrupt cut off of the left lower lobe bronchus and left lower lobe collapse may be due to a centrally obstructing mass with soft tissue fullness in the infrahilar region. There is a small left-sided pleural effusion. No right-sided pleural effusion. No pneumothorax. There is a nodule in the right lower lobe on series 4, image 115 measuring 4 mm. No other pulmonary nodules, masses, or infiltrates. Upper Abdomen: No acute abnormality. Musculoskeletal: No chest wall abnormality. No acute or significant osseous findings. IMPRESSION: 1. The findings are concerning for a centrally obstructing mass/malignancy resulting in collapse of the left lower lobe. While a discrete mass cannot be  measured, there is soft tissue fullness in the left infrahilar region. There is also abrupt cut off of the left lower lobe bronchus and narrowing of the left lower lobe pulmonary artery as it traverses this region. Recommend pulmonary consultation with consideration of bronchoscopy. 2. Atherosclerotic change in the thoracic aorta. 3. Small left-sided pleural effusion. 4. 4 mm nodule in the right lower lobe, too small to characterize. Aortic Atherosclerosis (ICD10-I70.0). Electronically Signed   By: Dorise Bullion III M.D   On: 09/19/2018 20:57     Time spent: The patient is critically ill with multiple organ systems failure and requires high complexity decision making for assessment and support, frequent evaluation and titration of therapies. Critical Care Time devoted to patient care services described in this note is 45 minutes   Author: Berle Mull, MD [facility] Pager: [phone] 09/20/2018 4:14 PM  Between 7PM-7AM, please contact night-coverage at [email or web address], password [facility]

## 2018-09-21 ENCOUNTER — Inpatient Hospital Stay (HOSPITAL_COMMUNITY): Payer: Medicare Other

## 2018-09-21 DIAGNOSIS — R0602 Shortness of breath: Secondary | ICD-10-CM

## 2018-09-21 LAB — CBC WITH DIFFERENTIAL/PLATELET
Abs Immature Granulocytes: 0.19 10*3/uL — ABNORMAL HIGH (ref 0.00–0.07)
BASOS ABS: 0.1 10*3/uL (ref 0.0–0.1)
BASOS PCT: 0 %
EOS ABS: 0.1 10*3/uL (ref 0.0–0.5)
Eosinophils Relative: 1 %
HCT: 33.7 % — ABNORMAL LOW (ref 39.0–52.0)
Hemoglobin: 10.8 g/dL — ABNORMAL LOW (ref 13.0–17.0)
Immature Granulocytes: 1 %
Lymphocytes Relative: 9 %
Lymphs Abs: 2 10*3/uL (ref 0.7–4.0)
MCH: 31.8 pg (ref 26.0–34.0)
MCHC: 32 g/dL (ref 30.0–36.0)
MCV: 99.1 fL (ref 80.0–100.0)
Monocytes Absolute: 1.4 10*3/uL — ABNORMAL HIGH (ref 0.1–1.0)
Monocytes Relative: 6 %
NEUTROS PCT: 83 %
NRBC: 0 % (ref 0.0–0.2)
Neutro Abs: 18.9 10*3/uL — ABNORMAL HIGH (ref 1.7–7.7)
PLATELETS: 281 10*3/uL (ref 150–400)
RBC: 3.4 MIL/uL — ABNORMAL LOW (ref 4.22–5.81)
RDW: 12.6 % (ref 11.5–15.5)
WBC: 22.7 10*3/uL — ABNORMAL HIGH (ref 4.0–10.5)

## 2018-09-21 LAB — FOLATE: Folate: 8.9 ng/mL (ref >5.9)

## 2018-09-21 LAB — IRON AND TIBC
Iron: 18 ug/dL — ABNORMAL LOW (ref 45–182)
SATURATION RATIOS: 14 % — AB (ref 17.9–39.5)
TIBC: 127 ug/dL — ABNORMAL LOW (ref 250–450)
UIBC: 109 ug/dL

## 2018-09-21 LAB — MAGNESIUM: MAGNESIUM: 1.9 mg/dL (ref 1.7–2.4)

## 2018-09-21 LAB — COMPREHENSIVE METABOLIC PANEL
ALBUMIN: 1.7 g/dL — AB (ref 3.5–5.0)
ALT: 42 U/L (ref 0–44)
AST: 65 U/L — AB (ref 15–41)
Alkaline Phosphatase: 114 U/L (ref 38–126)
Anion gap: 5 (ref 5–15)
BUN: 5 mg/dL — AB (ref 8–23)
CHLORIDE: 106 mmol/L (ref 98–111)
CO2: 21 mmol/L — ABNORMAL LOW (ref 22–32)
Calcium: 7.5 mg/dL — ABNORMAL LOW (ref 8.9–10.3)
Creatinine, Ser: 0.68 mg/dL (ref 0.61–1.24)
GFR calc Af Amer: >60 mL/min (ref >60)
Glucose, Bld: 114 mg/dL — ABNORMAL HIGH (ref 70–99)
POTASSIUM: 3.7 mmol/L (ref 3.5–5.1)
Sodium: 132 mmol/L — ABNORMAL LOW (ref 135–145)
Total Bilirubin: 0.4 mg/dL (ref 0.3–1.2)
Total Protein: 5.5 g/dL — ABNORMAL LOW (ref 6.5–8.1)

## 2018-09-21 LAB — RETICULOCYTES
Immature Retic Fract: 10.2 % (ref 2.3–15.9)
RBC.: 3.4 MIL/uL — AB (ref 4.22–5.81)
Retic Count, Absolute: 76.5 10*3/uL (ref 19.0–186.0)
Retic Ct Pct: 2.3 % (ref 0.4–3.1)

## 2018-09-21 LAB — VITAMIN B12: VITAMIN B 12: 737 pg/mL (ref 180–914)

## 2018-09-21 LAB — ECHOCARDIOGRAM COMPLETE
HEIGHTINCHES: 64 in
WEIGHTICAEL: 1932.99 [oz_av]

## 2018-09-21 LAB — FERRITIN: Ferritin: 390 ng/mL — ABNORMAL HIGH (ref 24–336)

## 2018-09-21 MED ORDER — SODIUM CHLORIDE 0.9 % IV SOLN
510.0000 mg | Freq: Once | INTRAVENOUS | Status: AC
  Administered 2018-09-21: 510 mg via INTRAVENOUS
  Filled 2018-09-21: qty 17

## 2018-09-21 MED ORDER — PERFLUTREN LIPID MICROSPHERE
INTRAVENOUS | Status: AC
  Administered 2018-09-21: 4 mL via INTRAVENOUS
  Filled 2018-09-21: qty 10

## 2018-09-21 MED ORDER — ALBUTEROL SULFATE (2.5 MG/3ML) 0.083% IN NEBU
INHALATION_SOLUTION | RESPIRATORY_TRACT | Status: AC
  Filled 2018-09-21: qty 3

## 2018-09-21 MED ORDER — PERFLUTREN LIPID MICROSPHERE
1.0000 mL | INTRAVENOUS | Status: AC | PRN
  Filled 2018-09-21: qty 10

## 2018-09-21 MED ORDER — SIMETHICONE 80 MG PO CHEW
80.0000 mg | CHEWABLE_TABLET | Freq: Four times a day (QID) | ORAL | Status: DC | PRN
  Filled 2018-09-21: qty 1

## 2018-09-21 MED ORDER — ALBUTEROL SULFATE (2.5 MG/3ML) 0.083% IN NEBU
2.5000 mg | INHALATION_SOLUTION | RESPIRATORY_TRACT | Status: DC | PRN
  Administered 2018-09-21 – 2018-09-24 (×5): 2.5 mg via RESPIRATORY_TRACT
  Filled 2018-09-21 (×4): qty 3

## 2018-09-21 MED ORDER — PNEUMOCOCCAL VAC POLYVALENT 25 MCG/0.5ML IJ INJ
0.5000 mL | INJECTION | INTRAMUSCULAR | Status: AC
  Administered 2018-09-22: 0.5 mL via INTRAMUSCULAR
  Filled 2018-09-21: qty 0.5

## 2018-09-21 NOTE — Progress Notes (Signed)
[facility] Progress Note  Patient: Ronald [name] ION:[id number]   PCP: Patient, No Pcp Per DOB: 1956-03-01   DOA: 09/19/2018   DOS: 09/21/2018   Date of Service: the patient was seen and examined on 09/21/2018  Brief hospital course: Pt. with PMH of smoker, alcohol abuse; admitted on 09/19/2018, presented with complaint of shortness of breath and cough, was found to have left lower lobe lung mass, possible obstructive pneumonia, septic shock. Currently further plan is continue treating septic shock, further work-up per pulmonary.  Subjective: Some abdominal discomfort.  No nausea no vomiting.  Had 2 loose watery BM.  Reports that he has daily loose BM at home as well.  Assessment and Plan: Septic shock  secondary to  postobstructive pneumonia Hypotension, tachycardia, tachypnea, meeting sepsis criteria, present on admission. Continue supplemental oxygen. Aggressively hydrated, 10 L positive. Hold off on fluids since the blood pressure now appears better. Bronchodilators as needed. Continue IV Zithromax 500 mg IVPB daily. Continue ceftriaxone 1 g IVPB every day.  Will add Flagyl, low threshold to broaden the coverage. Check sputum Gram stain, culture and sensitivity. Follow-up blood culture and sensitivity. Check strep pneumoniae and Legionella urinary antigen. I will add chest PT with vest due to concern for left lung collapse secondary to mucous plugging.  Active Problems:   Lung mass To be evaluated by pulmonology.   Check sputum cytology.   No plan from pulmonary for now for further work-up based on their note.    Abnormal ECG/history of CAD Not taking any meds. Echocardiogram shows preserved EF, no significant wall motion abnormality.  No significant valvular abnormality as well.    COPD (chronic obstructive pulmonary disease) (HCC) Supplemental oxygen and bronchodilators as needed.    Abnormal LFTs Likely due to alcohol abuse. However, there is a possibility of  metastatic disease from lung mass.    Hyponatremia This could be secondary to poor oral intake. However, monitor sodium level closely    Hypertension Currently hypotensive. Monitor blood pressure.    Alcoholism [facility])  family and the patient initially told that he was only drinking 1 beer a day, but now the that he is drinking 18 beers a day.  He used to drink a lot more until about a month ago.  He denies history of DTs or prior detox admissions to the hospital. Continue CIWA protocol.  Anemia likely of chronic disease and nutritional deficiency  Iron deficiency anemia. We will replace IV iron for now.  Monitor daily H&H.  Nutrition Problem: Increased nutrient needs Etiology: acute illness, chronic illness Interventions: Interventions: Ensure Enlive (each supplement provides 350kcal and 20 grams of protein)  Diet: regular diet DVT Prophylaxis: subcutaneous Heparin  Advance goals of care discussion: full code  Family Communication: family was present at bedside, at the time of interview. The pt provided permission to discuss medical plan with the family. Opportunity was given to ask question and all questions were answered satisfactorily.   Disposition:  Discharge to be determined.  Consultants: PCCM  Procedures: Echocardiogram  Scheduled Meds: . benzonatate  100 mg Oral BID  . feeding supplement (ENSURE ENLIVE)  237 mL Oral BID BM  . folic acid  1 mg Oral Daily  . heparin  5,000 Units Subcutaneous Q8H  . nicotine  21 mg Transdermal Daily  . [START ON 09/22/2018] pneumococcal 23 valent vaccine  0.5 mL Intramuscular Tomorrow-1000  . thiamine  100 mg Oral Daily   Continuous Infusions: . azithromycin 500 mg (09/20/18 2101)  . cefTRIAXone (  ROCEPHIN)  IV 1 g (09/20/18 2100)  . metronidazole 500 mg (09/21/18 0934)   PRN Meds: acetaminophen **OR** acetaminophen, albuterol, LORazepam, ondansetron **OR** ondansetron (ZOFRAN) IV, oxyCODONE,  simethicone Antibiotics: Anti-infectives (From admission, onward)   Start     Dose/Rate Route Frequency Ordered Stop   09/20/18 2100  cefTRIAXone (ROCEPHIN) 1 g in sodium chloride 0.9 % 100 mL IVPB     1 g 200 mL/hr over 30 Minutes Intravenous Every 24 hours 09/19/18 2201 09/27/18 2059   09/20/18 2100  azithromycin (ZITHROMAX) 500 mg in sodium chloride 0.9 % 250 mL IVPB     500 mg 250 mL/hr over 60 Minutes Intravenous Every 24 hours 09/19/18 2201 09/27/18 2059   09/20/18 1630  metroNIDAZOLE (FLAGYL) IVPB 500 mg     500 mg 100 mL/hr over 60 Minutes Intravenous Every 8 hours 09/20/18 1627     09/19/18 2145  cefTRIAXone (ROCEPHIN) 1 g in sodium chloride 0.9 % 100 mL IVPB     1 g 200 mL/hr over 30 Minutes Intravenous  Once 09/19/18 2142 09/19/18 2342   09/19/18 2145  azithromycin (ZITHROMAX) 500 mg in sodium chloride 0.9 % 250 mL IVPB     500 mg 250 mL/hr over 60 Minutes Intravenous  Once 09/19/18 2142 09/20/18 0045       Objective: Physical Exam: Vitals:   09/21/18 0000 09/21/18 0314 09/21/18 0400 09/21/18 0732  BP: 113/63 121/71 121/71 124/67  Pulse: 98 86 83 81  Resp:  (!) 26 (!) 24   Temp:  97.7 F (36.5 C)    TempSrc:  Oral    SpO2: 96% 100% 98% 100%  Weight:      Height:        Intake/Output Summary (Last 24 hours) at 09/21/2018 1542 Last data filed at 09/21/2018 1013 Gross per 24 hour  Intake 4001.51 ml  Output 100 ml  Net 3901.51 ml   Filed Weights   09/19/18 1459 09/20/18 0144  Weight: 49.9 kg 54.8 kg   General: Alert, Awake and Oriented to Time, Place and Person. Appear in marked distress, affect flat Eyes: PERRL, Conjunctiva normal ENT: Oral Mucosa clear moist. Neck: difficult to assess  JVD, no Abnormal Mass Or lumps Cardiovascular: S1 and S2 Present, no Murmur, Peripheral Pulses Present Respiratory: increased respiratory effort, Bilateral Air entry equal and Decreased, no use of accessory muscle, bilateral Crackles, no wheezes Abdomen: Bowel Sound  present, Soft and no tenderness, no hernia Skin: no redness, no Rash, no induration Extremities: no Pedal edema, no calf tenderness Neurologic: Grossly no focal neuro deficit. Bilaterally Equal motor strength  Data Reviewed: CBC: Recent Labs  Lab 09/19/18 1624 09/20/18 1012 09/21/18 0217  WBC 26.5* 12.4* 22.7*  NEUTROABS 23.8* 9.6* 18.9*  HGB 12.1* 10.4* 10.8*  HCT 36.2* 31.7* 33.7*  MCV 96.0 96.6 99.1  PLT 307 271 379   Basic Metabolic Panel: Recent Labs  Lab 09/19/18 1624 09/20/18 1012 09/21/18 0217  NA 130* 132* 132*  K 3.6 3.2* 3.7  CL 93* 109 106  CO2 26 19* 21*  GLUCOSE 176* 95 114*  BUN 8 5* 5*  CREATININE 0.78 0.57* 0.68  CALCIUM 8.6* 7.2* 7.5*  MG 2.0  --  1.9  PHOS 3.1  --   --     Liver Function Tests: Recent Labs  Lab 09/19/18 1624 09/20/18 1012 09/21/18 0217  AST 75* 54* 65*  ALT 54* 38 42  ALKPHOS 124 99 114  BILITOT 0.7 0.4 0.4  PROT 6.9 5.2* 5.5*  ALBUMIN 2.2* 1.5* 1.7*   Recent Labs  Lab 09/19/18 1624  LIPASE 25   No results for input(s): AMMONIA in the last 168 hours. Coagulation Profile: Recent Labs  Lab 09/20/18 1012  INR 1.33   Cardiac Enzymes: Recent Labs  Lab 09/20/18 1012  TROPONINI <0.03   BNP (last 3 results) No results for input(s): PROBNP in the last 8760 hours. CBG: No results for input(s): GLUCAP in the last 168 hours. Studies: Dg Chest Port 1 View  Result Date: 09/21/2018 CLINICAL DATA:  Follow-up obstructive LEFT LOWER LOBE atelectasis and/or pneumonia. EXAM: PORTABLE CHEST 1 VIEW COMPARISON:  CT chest yesterday. Chest x-rays yesterday and 09/30/2014. FINDINGS: No interval change in the complete atelectasis involving the LEFT LOWER LOBE. A small to moderate-sized LEFT pleural effusion which is likely increasing in size. No new pulmonary parenchymal abnormalities elsewhere. Cardiac silhouette normal in size, unchanged. Thoracic aorta atherosclerotic. Hilar and mediastinal contours otherwise unremarkable.  IMPRESSION: 1. No change in the complete atelectasis involving the LEFT LOWER LOBE, likely post obstructive based on yesterday's CT. 2. Small LEFT pleural effusion which is increasing in size. 3. No new pulmonary parenchymal abnormalities. Electronically Signed   By: Evangeline Dakin M.D.   On: 09/21/2018 11:26     Time spent:  45 minutes   Author: Berle Mull, MD [facility] Pager: [phone] 09/21/2018 3:42 PM  Between 7PM-7AM, please contact night-coverage at [email or web address], password [facility]

## 2018-09-21 NOTE — Progress Notes (Signed)
  Echocardiogram 2D Echocardiogram has been performed.  Ronald [name] 09/21/2018, 1:05 PM

## 2018-09-22 DIAGNOSIS — F17210 Nicotine dependence, cigarettes, uncomplicated: Secondary | ICD-10-CM

## 2018-09-22 DIAGNOSIS — J189 Pneumonia, unspecified organism: Secondary | ICD-10-CM

## 2018-09-22 LAB — COMPREHENSIVE METABOLIC PANEL
ALT: 28 U/L (ref 0–44)
ANION GAP: 6 (ref 5–15)
AST: 27 U/L (ref 15–41)
Albumin: 1.5 g/dL — ABNORMAL LOW (ref 3.5–5.0)
Alkaline Phosphatase: 90 U/L (ref 38–126)
BUN: <5 mg/dL — ABNORMAL LOW (ref 8–23)
CHLORIDE: 106 mmol/L (ref 98–111)
CO2: 21 mmol/L — AB (ref 22–32)
Calcium: 7.7 mg/dL — ABNORMAL LOW (ref 8.9–10.3)
Creatinine, Ser: 0.66 mg/dL (ref 0.61–1.24)
GFR calc non Af Amer: >60 mL/min (ref >60)
Glucose, Bld: 114 mg/dL — ABNORMAL HIGH (ref 70–99)
Potassium: 3.1 mmol/L — ABNORMAL LOW (ref 3.5–5.1)
SODIUM: 133 mmol/L — AB (ref 135–145)
Total Bilirubin: 0.6 mg/dL (ref 0.3–1.2)
Total Protein: 5.1 g/dL — ABNORMAL LOW (ref 6.5–8.1)

## 2018-09-22 LAB — CBC WITH DIFFERENTIAL/PLATELET
Abs Immature Granulocytes: 0.15 10*3/uL — ABNORMAL HIGH (ref 0.00–0.07)
BASOS ABS: 0.1 10*3/uL (ref 0.0–0.1)
Basophils Relative: 0 %
Eosinophils Absolute: 0.1 10*3/uL (ref 0.0–0.5)
Eosinophils Relative: 1 %
HEMATOCRIT: 30.9 % — AB (ref 39.0–52.0)
HEMOGLOBIN: 10 g/dL — AB (ref 13.0–17.0)
IMMATURE GRANULOCYTES: 1 %
LYMPHS ABS: 1.9 10*3/uL (ref 0.7–4.0)
LYMPHS PCT: 9 %
MCH: 31.4 pg (ref 26.0–34.0)
MCHC: 32.4 g/dL (ref 30.0–36.0)
MCV: 97.2 fL (ref 80.0–100.0)
Monocytes Absolute: 1.4 10*3/uL — ABNORMAL HIGH (ref 0.1–1.0)
Monocytes Relative: 6 %
NEUTROS PCT: 83 %
NRBC: 0 % (ref 0.0–0.2)
Neutro Abs: 17.4 10*3/uL — ABNORMAL HIGH (ref 1.7–7.7)
Platelets: 277 10*3/uL (ref 150–400)
RBC: 3.18 MIL/uL — ABNORMAL LOW (ref 4.22–5.81)
RDW: 12.7 % (ref 11.5–15.5)
WBC: 21 10*3/uL — ABNORMAL HIGH (ref 4.0–10.5)

## 2018-09-22 LAB — MAGNESIUM: Magnesium: 1.7 mg/dL (ref 1.7–2.4)

## 2018-09-22 MED ORDER — POLYETHYLENE GLYCOL 3350 17 G PO PACK
17.0000 g | PACK | Freq: Every day | ORAL | Status: DC
  Filled 2018-09-22 (×2): qty 1

## 2018-09-22 MED ORDER — GUAIFENESIN ER 600 MG PO TB12
600.0000 mg | ORAL_TABLET | Freq: Two times a day (BID) | ORAL | Status: DC
  Administered 2018-09-22 – 2018-09-24 (×5): 600 mg via ORAL
  Filled 2018-09-22 (×5): qty 1

## 2018-09-22 MED ORDER — METRONIDAZOLE 500 MG PO TABS
500.0000 mg | ORAL_TABLET | Freq: Three times a day (TID) | ORAL | Status: DC
  Administered 2018-09-22 – 2018-09-24 (×6): 500 mg via ORAL
  Filled 2018-09-22 (×6): qty 1

## 2018-09-22 MED ORDER — POTASSIUM CHLORIDE CRYS ER 20 MEQ PO TBCR
40.0000 meq | EXTENDED_RELEASE_TABLET | Freq: Once | ORAL | Status: AC
  Administered 2018-09-22: 40 meq via ORAL
  Filled 2018-09-22: qty 2

## 2018-09-22 MED ORDER — SENNOSIDES-DOCUSATE SODIUM 8.6-50 MG PO TABS
1.0000 | ORAL_TABLET | Freq: Two times a day (BID) | ORAL | Status: DC
  Administered 2018-09-23: 1 via ORAL
  Filled 2018-09-22 (×3): qty 1

## 2018-09-22 MED ORDER — MAGNESIUM SULFATE 2 GM/50ML IV SOLN
2.0000 g | Freq: Once | INTRAVENOUS | Status: AC
  Administered 2018-09-22: 2 g via INTRAVENOUS
  Filled 2018-09-22: qty 50

## 2018-09-22 MED ORDER — AZITHROMYCIN 250 MG PO TABS
500.0000 mg | ORAL_TABLET | ORAL | Status: DC
  Administered 2018-09-22 – 2018-09-23 (×2): 500 mg via ORAL
  Filled 2018-09-22 (×2): qty 2

## 2018-09-22 NOTE — Progress Notes (Signed)
PHARMACIST - PHYSICIAN COMMUNICATION DR:  Erlinda Hong CONCERNING: Antibiotic IV to Oral Route Change Policy  RECOMMENDATION: This patient is receiving Azithromycin and Metronidazole by the intravenous route.  Based on criteria approved by the Pharmacy and Therapeutics Committee, the antibiotic(s) is/are being converted to the equivalent oral dose form(s).   DESCRIPTION: These criteria include:  Patient being treated for a respiratory tract infection, urinary tract infection, cellulitis or clostridium difficile associated diarrhea if on metronidazole  The patient is not neutropenic and does not exhibit a GI malabsorption state  The patient is eating (either orally or via tube) and/or has been taking other orally administered medications for a least 24 hours  The patient is improving clinically and has a Tmax < 100.5  If you have questions about this conversion, please contact the [place], PharmD PGY1 Pharmacy Resident Phone [phone] 09/22/2018     1:47 PM

## 2018-09-22 NOTE — Plan of Care (Signed)
Pt denies pain and does not exhibit s/s of pain.  Noted swings in mood, pt short at times, pleasant others.  Support provided.  Pt calm at end of assessment.

## 2018-09-22 NOTE — Progress Notes (Signed)
Attempted to engage patient for walking O2 evaluation. Patient has been fatigued and sick with diarrhea and intermittent shortness of breath. He required Albuterol for wheezing in the morning. He is feeling somewhat better this afternoon, but declined walking due to feeling weak. He has not yet been seen by PT, but the most he has been able to do is transfer to the bedside commode the past few days and he required contact guard assist and supervision due to weakness, dizziness and unsteady gait.

## 2018-09-22 NOTE — Progress Notes (Signed)
PROGRESS NOTE  Ronald [name] YJE:[id number] DOB: Jul 07, 1956 DOA: 09/19/2018 PCP: Patient, No Pcp Per  HPI/Recap of past 24 hours:  Constipated,  Reports breathing is better, still have productive cough, denies chest pain  Assessment/Plan: Principal Problem:   Obstructive pneumonia Active Problems:   Abnormal LFTs   Hyponatremia   COPD (chronic obstructive pulmonary disease) (HCC)   Hypertension   Alcoholism ([place])   Anemia   Lung mass   Abnormal ECG   CAD (coronary artery disease)   Obstructive lung mass/post obstructive pna/acute hypoxic respiratory failure, severe sepsis on presentation -continue Rocephin Flagyl and Zithromax for now, blood culture no growth, MRSA screening negative, sputum culture pending -Persistent leukocytosis but appear improving slowly , WBC peaked at 26.5 today's 21 -Per I's and O's he is currently are positive but on exam does not appear volume overloaded,  -Add Mucinex continue chest PT wean oxygen as tolerated   COPD (chronic obstructive pulmonary disease) (HCC) Supplemental oxygen and bronchodilators as needed Currently no wheezing  Chronic diastolic CHF - euvolemic to dry -Echo with preserved left ventricular EF with grade 1 diastolic dysfunction -Monitor volume status  Hyponatremia  -sodium 130 on presentation -Proving today is 133  Hypokalemia/hypomagnesemia -Replace K and mag repeat labs in the morning  Normocytic anemia -Hemoglobin 10 no overt bleeding -Monitor  lft improving, elevated LFT on presentation likely from sepsis And possible for alcohol  Constipation; KUB with large stool burden in the colon -Stool regimen, he has signs of overflow diarrhea  Alcoholism ([place])  family and the patient initially told that he was only drinking 1 beer a day, but now the that he is drinking 18 beers a day. He used to drink a lot more until about a month ago. He denies history of DTs or prior detox admissions to the  hospital. Continue CIWA protocol.  Impaired memory -CT had no acute findings, does has "Atrophy, chronic microvascular disease" -monitor  Cigarette smoking Smoking cessation education provided nicotine patch I have discussed tobacco cessation with the patient.  I have counseled the patient regarding the negative impacts of continued tobacco use including but not limited to lung cancer, COPD, and cardiovascular disease.  I have discussed alternatives to tobacco and modalities that may help facilitate tobacco cessation including but not limited to biofeedback, hypnosis, and medications.  Total time spent with tobacco counseling was 4 minutes.   Code Status: full  Family Communication: patient   Disposition Plan: We will need to wean oxygen and get physical therapy Patient reports previously he lives by himself he plan to go to his sister's house after discharge  Consultants:  Pulmonary critical care  Procedures:  None  Antibiotics:  As above   Objective: BP (!) 90/58 (BP Location: Right Arm)   Pulse 84   Temp 97.7 F (36.5 C) (Oral)   Resp (!) 29   Ht 5\' 4"  (1.626 m)   Wt 54.8 kg   SpO2 98%   BMI 20.74 kg/m   Intake/Output Summary (Last 24 hours) at 09/22/2018 0727 Last data filed at 09/22/2018 0125 Gross per 24 hour  Intake 2909.5 ml  Output 275 ml  Net 2634.5 ml   Filed Weights   09/19/18 1459 09/20/18 0144  Weight: 49.9 kg 54.8 kg    Exam: Patient is examined daily including today on 09/22/2018, exams remain the same as of yesterday except that has changed    General:  NAD, not oriented to the month  Cardiovascular: RRR  Respiratory: diminished on  the right, with rhonchi   Abdomen: Soft/ND/NT, positive BS  Musculoskeletal: No Edema  Neuro: alert, oriented to year, place  Data Reviewed: Basic Metabolic Panel: Recent Labs  Lab 09/19/18 1624 09/20/18 1012 09/21/18 0217 09/22/18 0224  NA 130* 132* 132* 133*  K 3.6 3.2* 3.7 3.1*  CL 93*  109 106 106  CO2 26 19* 21* 21*  GLUCOSE 176* 95 114* 114*  BUN 8 5* 5* <5*  CREATININE 0.78 0.57* 0.68 0.66  CALCIUM 8.6* 7.2* 7.5* 7.7*  MG 2.0  --  1.9 1.7  PHOS 3.1  --   --   --    Liver Function Tests: Recent Labs  Lab 09/19/18 1624 09/20/18 1012 09/21/18 0217 09/22/18 0224  AST 75* 54* 65* 27  ALT 54* 38 42 28  ALKPHOS 124 99 114 90  BILITOT 0.7 0.4 0.4 0.6  PROT 6.9 5.2* 5.5* 5.1*  ALBUMIN 2.2* 1.5* 1.7* 1.5*   Recent Labs  Lab 09/19/18 1624  LIPASE 25   No results for input(s): AMMONIA in the last 168 hours. CBC: Recent Labs  Lab 09/19/18 1624 09/20/18 1012 09/21/18 0217 09/22/18 0224  WBC 26.5* 12.4* 22.7* 21.0*  NEUTROABS 23.8* 9.6* 18.9* 17.4*  HGB 12.1* 10.4* 10.8* 10.0*  HCT 36.2* 31.7* 33.7* 30.9*  MCV 96.0 96.6 99.1 97.2  PLT 307 271 281 277   Cardiac Enzymes:   Recent Labs  Lab 09/20/18 1012  TROPONINI <0.03   BNP (last 3 results) Recent Labs    09/19/18 1624  BNP 77.0    ProBNP (last 3 results) No results for input(s): PROBNP in the last 8760 hours.  CBG: No results for input(s): GLUCAP in the last 168 hours.  Recent Results (from the past 240 hour(s))  Culture, blood (Routine X 2) w Reflex to ID Panel     Status: None (Preliminary result)   Collection Time: 09/19/18 10:00 PM  Result Value Ref Range Status   Specimen Description BLOOD RIGHT ARM  Final   Special Requests   Final    BOTTLES DRAWN AEROBIC AND ANAEROBIC Blood Culture adequate volume   Culture   Final    NO GROWTH < 24 HOURS Performed at [facility], [address] [place], [place] [address]    Report Status PENDING  Incomplete  Culture, blood (Routine X 2) w Reflex to ID Panel     Status: None (Preliminary result)   Collection Time: 09/19/18 10:05 PM  Result Value Ref Range Status   Specimen Description BLOOD RIGHT ARM  Final   Special Requests   Final    BOTTLES DRAWN AEROBIC AND ANAEROBIC Blood Culture adequate volume   Culture   Final    NO GROWTH < 24  HOURS Performed at [facility], [address] [place], [place] [address]    Report Status PENDING  Incomplete  MRSA PCR Screening     Status: None   Collection Time: 09/20/18  1:44 AM  Result Value Ref Range Status   MRSA by PCR NEGATIVE NEGATIVE Final    Comment:        The GeneXpert MRSA Assay (FDA approved for NASAL specimens only), is one component of a comprehensive MRSA colonization surveillance program. It is not intended to diagnose MRSA infection nor to guide or monitor treatment for MRSA infections. Performed at [facility], [place] [address], [place], [place] [address]      Studies: Dg Chest Port 1 View  Result Date: 09/21/2018 CLINICAL DATA:  Follow-up  obstructive LEFT LOWER LOBE atelectasis and/or pneumonia. EXAM: PORTABLE CHEST 1 VIEW COMPARISON:  CT chest yesterday. Chest x-rays yesterday and 09/30/2014. FINDINGS: No interval change in the complete atelectasis involving the LEFT LOWER LOBE. A small to moderate-sized LEFT pleural effusion which is likely increasing in size. No new pulmonary parenchymal abnormalities elsewhere. Cardiac silhouette normal in size, unchanged. Thoracic aorta atherosclerotic. Hilar and mediastinal contours otherwise unremarkable. IMPRESSION: 1. No change in the complete atelectasis involving the LEFT LOWER LOBE, likely post obstructive based on yesterday's CT. 2. Small LEFT pleural effusion which is increasing in size. 3. No new pulmonary parenchymal abnormalities. Electronically Signed   By: Evangeline Dakin M.D.   On: 09/21/2018 11:26   Dg Abd Portable 1v  Result Date: 09/21/2018 CLINICAL DATA:  62 year old male with history of abdominal cramping. EXAM: PORTABLE ABDOMEN - 1 VIEW COMPARISON:  No priors. FINDINGS: Gas and stool is noted throughout the colon. There is a gas present throughout the small bowel, without evidence of small bowel dilatation or air-fluid levels. No definite pneumoperitoneum noted on this single supine image.  IMPRESSION: 1. Nonspecific, nonobstructive bowel gas pattern. 2. No pneumoperitoneum. Electronically Signed   By: Vinnie Langton M.D.   On: 09/21/2018 16:41    Scheduled Meds: . benzonatate  100 mg Oral BID  . feeding supplement (ENSURE ENLIVE)  237 mL Oral BID BM  . folic acid  1 mg Oral Daily  . guaiFENesin  600 mg Oral BID  . heparin  5,000 Units Subcutaneous Q8H  . nicotine  21 mg Transdermal Daily  . pneumococcal 23 valent vaccine  0.5 mL Intramuscular Tomorrow-1000  . senna-docusate  1 tablet Oral BID  . thiamine  100 mg Oral Daily    Continuous Infusions: . azithromycin 500 mg (09/21/18 2220)  . cefTRIAXone (ROCEPHIN)  IV 1 g (09/21/18 2227)  . metronidazole 500 mg (09/22/18 0125)     Time spent: 11mins I have personally reviewed and interpreted on  09/22/2018 daily labs, tele strips, imagings as discussed above under date review session and assessment and plans.  I reviewed all nursing notes, pharmacy notes, consultant notes,  vitals, pertinent old records  I have discussed plan of care as described above with RN , patient on 09/22/2018   Florencia Reasons MD, PhD  [facility] Pager [phone]. If 7PM-7AM, please contact night-coverage at [email or web address], password [facility] 09/22/2018, 7:27 AM  LOS: 3 days

## 2018-09-22 NOTE — Progress Notes (Signed)
Pt sleeping Sats 100%  Flutter valve at bed side

## 2018-09-23 ENCOUNTER — Inpatient Hospital Stay (HOSPITAL_COMMUNITY): Payer: Medicare Other

## 2018-09-23 DIAGNOSIS — Z6821 Body mass index (BMI) 21.0-21.9, adult: Secondary | ICD-10-CM

## 2018-09-23 DIAGNOSIS — R634 Abnormal weight loss: Secondary | ICD-10-CM

## 2018-09-23 DIAGNOSIS — Z8611 Personal history of tuberculosis: Secondary | ICD-10-CM

## 2018-09-23 DIAGNOSIS — J440 Chronic obstructive pulmonary disease with acute lower respiratory infection: Secondary | ICD-10-CM

## 2018-09-23 DIAGNOSIS — J9811 Atelectasis: Secondary | ICD-10-CM

## 2018-09-23 DIAGNOSIS — F17200 Nicotine dependence, unspecified, uncomplicated: Secondary | ICD-10-CM

## 2018-09-23 DIAGNOSIS — E43 Unspecified severe protein-calorie malnutrition: Secondary | ICD-10-CM

## 2018-09-23 DIAGNOSIS — J181 Lobar pneumonia, unspecified organism: Secondary | ICD-10-CM

## 2018-09-23 DIAGNOSIS — D649 Anemia, unspecified: Secondary | ICD-10-CM

## 2018-09-23 DIAGNOSIS — Z7289 Other problems related to lifestyle: Secondary | ICD-10-CM

## 2018-09-23 LAB — CBC WITH DIFFERENTIAL/PLATELET
Abs Immature Granulocytes: 0.1 10*3/uL — ABNORMAL HIGH (ref 0.00–0.07)
BASOS PCT: 1 %
Basophils Absolute: 0.1 10*3/uL (ref 0.0–0.1)
EOS ABS: 0.1 10*3/uL (ref 0.0–0.5)
Eosinophils Relative: 1 %
HCT: 30.1 % — ABNORMAL LOW (ref 39.0–52.0)
Hemoglobin: 10 g/dL — ABNORMAL LOW (ref 13.0–17.0)
Immature Granulocytes: 1 %
Lymphocytes Relative: 13 %
Lymphs Abs: 1.9 10*3/uL (ref 0.7–4.0)
MCH: 31.8 pg (ref 26.0–34.0)
MCHC: 33.2 g/dL (ref 30.0–36.0)
MCV: 95.9 fL (ref 80.0–100.0)
MONOS PCT: 6 %
Monocytes Absolute: 0.9 10*3/uL (ref 0.1–1.0)
Neutro Abs: 11.9 10*3/uL — ABNORMAL HIGH (ref 1.7–7.7)
Neutrophils Relative %: 78 %
PLATELETS: 272 10*3/uL (ref 150–400)
RBC: 3.14 MIL/uL — ABNORMAL LOW (ref 4.22–5.81)
RDW: 12.8 % (ref 11.5–15.5)
WBC: 15 10*3/uL — ABNORMAL HIGH (ref 4.0–10.5)
nRBC: 0 % (ref 0.0–0.2)

## 2018-09-23 LAB — BASIC METABOLIC PANEL
Anion gap: 5 (ref 5–15)
CALCIUM: 7.7 mg/dL — AB (ref 8.9–10.3)
CO2: 23 mmol/L (ref 22–32)
Chloride: 106 mmol/L (ref 98–111)
Creatinine, Ser: 0.56 mg/dL — ABNORMAL LOW (ref 0.61–1.24)
GFR calc Af Amer: >60 mL/min (ref >60)
GLUCOSE: 134 mg/dL — AB (ref 70–99)
Potassium: 3.5 mmol/L (ref 3.5–5.1)
Sodium: 134 mmol/L — ABNORMAL LOW (ref 135–145)

## 2018-09-23 LAB — PULMONARY FUNCTION TEST
FEF 25-75 Pre: 0.44 L/sec
FEF2575-%Pred-Pre: 19 %
FEV1-%PRED-PRE: 26 %
FEV1-Pre: 0.74 L
FEV1FVC-%PRED-PRE: 75 %
FEV6-%PRED-PRE: 37 %
FEV6-PRE: 1.31 L
FEV6FVC-%Pred-Pre: 105 %
FVC-%Pred-Pre: 35 %
PRE FEV1/FVC RATIO: 56 %
PRE FEV6/FVC RATIO: 100 %

## 2018-09-23 LAB — EXPECTORATED SPUTUM ASSESSMENT W GRAM STAIN, RFLX TO RESP C

## 2018-09-23 LAB — CORTISOL: Cortisol, Plasma: 10.9 ug/dL

## 2018-09-23 LAB — MAGNESIUM: Magnesium: 2 mg/dL (ref 1.7–2.4)

## 2018-09-23 MED ORDER — POTASSIUM CHLORIDE CRYS ER 20 MEQ PO TBCR
40.0000 meq | EXTENDED_RELEASE_TABLET | Freq: Once | ORAL | Status: AC
  Administered 2018-09-23: 40 meq via ORAL
  Filled 2018-09-23: qty 2

## 2018-09-23 MED ORDER — SODIUM CHLORIDE 0.9 % IV SOLN
INTRAVENOUS | Status: AC
  Administered 2018-09-23: 11:00:00 via INTRAVENOUS

## 2018-09-23 NOTE — Care Management Note (Addendum)
Case Management Note  Patient Details  Name: Ronald [name] MRN: [id number] Date of Birth: [birth year]  Subjective/Objective:    Home alone, obstructive pna, lung mass, No PCP listed, NCM asked patient who is PCP, he states he has a lung MD but would like for me to speak with his sister [name] about that, she will be here shortly.  NCM left [facility] agency list with patient.  He also will need home oxygen, Butch Penny with [facility] notified, will have oxygen in am.              Action/Plan: NCM will follow for transition of care needs.   Expected Discharge Date:                  Expected Discharge Plan:  [place]  In-House Referral:     Discharge planning Services  CM Consult  Post Acute Care Choice:    Choice offered to:     DME Arranged:   oxygen   DME Agency[facility]   [facility]  HH Arranged:    [place] Agency:     Status of Service:  In process, will continue to follow  If discussed at Long Length of Stay Meetings, dates discussed:    Additional Comments:  Zenon Mayo, RN 09/23/2018, 3:30 PM

## 2018-09-23 NOTE — Progress Notes (Signed)
PT Cancellation Note  Patient Details Name: WALTON DIGILIO MRN: [id number] DOB: 1955-12-21   Cancelled Treatment:    Reason Eval/Treat Not Completed: Other (comment) attempted to evaluate patient, he was actively working with another provider and not available. Plan to attempt to return if time/schedule allow, otherwise will attempt on next day of service.    Deniece Ree PT, DPT, CBIS  Supplemental Physical Therapist [facility]    Pager [phone] Acute Rehab Office [phone]

## 2018-09-23 NOTE — Consult Note (Addendum)
New Hematology/Oncology Consult   Requesting YQ:MVHQ Hu       Reason for Consult:  Left lower lobe collapse, probable obstructing lung mass  HPI: Ronald [name] presented to the emergency room with weight loss and generalized weakness on 09/19/2018.  He complained of a cough.  Chest x-ray revealed left lower lobe collapse. A CT of the chest 09/19/2018 revealed narrowing of the left lower lobe central pulmonary artery in the infrahilar region.  There is a small left pleural effusion.  Soft tissue fullness is noted in the inferior left hilum.  No other adenopathy.  Is abrupt cut off of the left lower lobe bronchus with complete collapse of the left lower lobe.  4 mm right lower lobe nodule.  He was admitted and treated for pneumonia.  Pulmonary medicine was consulted.  He is being scheduled for an outpatient EBUS and PET scan.    Past Medical History:  Diagnosis Date  . Alcoholism ([place])   . COPD (chronic obstructive pulmonary disease) ([place])   . Hypertension   . TB (pulmonary tuberculosis)   Past surgical history: None     Current Facility-Administered Medications:  .  0.9 %  sodium chloride infusion, , Intravenous, Continuous, Florencia Reasons, MD, Last Rate: 75 mL/hr at 09/23/18 1121 .  acetaminophen (TYLENOL) tablet 650 mg, 650 mg, Oral, Q6H PRN, 650 mg at 09/20/18 2102 **OR** acetaminophen (TYLENOL) suppository 650 mg, 650 mg, Rectal, Q6H PRN, Reubin Milan, MD .  albuterol (PROVENTIL) (2.5 MG/3ML) 0.083% nebulizer solution 2.5 mg, 2.5 mg, Nebulization, Q2H PRN, Lavina Hamman, MD, 2.5 mg at 09/22/18 2016 .  azithromycin (ZITHROMAX) tablet 500 mg, 500 mg, Oral, Q24H, Lore, Melissa A, RPH, 500 mg at 09/22/18 2126 .  benzonatate (TESSALON) capsule 100 mg, 100 mg, Oral, BID, Lavina Hamman, MD, 100 mg at 09/23/18 0856 .  cefTRIAXone (ROCEPHIN) 1 g in sodium chloride 0.9 % 100 mL IVPB, 1 g, Intravenous, Q24H, Reubin Milan, MD, Last Rate: 200 mL/hr at 09/22/18 2202, 1 g at 09/22/18  2202 .  feeding supplement (ENSURE ENLIVE) (ENSURE ENLIVE) liquid 237 mL, 237 mL, Oral, BID BM, Lavina Hamman, MD, 237 mL at 09/23/18 1436 .  folic acid (FOLVITE) tablet 1 mg, 1 mg, Oral, Daily, Lavina Hamman, MD, 1 mg at 09/23/18 0857 .  guaiFENesin (MUCINEX) 12 hr tablet 600 mg, 600 mg, Oral, BID, Florencia Reasons, MD, 600 mg at 09/23/18 0855 .  heparin injection 5,000 Units, 5,000 Units, Subcutaneous, Q8H, Reubin Milan, MD, 5,000 Units at 09/23/18 1437 .  LORazepam (ATIVAN) injection 2-3 mg, 2-3 mg, Intravenous, Q1H PRN, Lavina Hamman, MD .  metroNIDAZOLE (FLAGYL) tablet 500 mg, 500 mg, Oral, Q8H, Lore, Melissa A, RPH, 500 mg at 09/23/18 1435 .  nicotine (NICODERM CQ - dosed in mg/24 hours) patch 21 mg, 21 mg, Transdermal, Daily, Lavina Hamman, MD, 21 mg at 09/23/18 0900 .  ondansetron (ZOFRAN) tablet 4 mg, 4 mg, Oral, Q6H PRN, 4 mg at 09/21/18 1335 **OR** ondansetron (ZOFRAN) injection 4 mg, 4 mg, Intravenous, Q6H PRN, Reubin Milan, MD .  oxyCODONE (Oxy IR/ROXICODONE) immediate release tablet 5 mg, 5 mg, Oral, Q6H PRN, Reubin Milan, MD, 5 mg at 09/21/18 1335 .  polyethylene glycol (MIRALAX / GLYCOLAX) packet 17 g, 17 g, Oral, Daily, Florencia Reasons, MD .  senna-docusate (Senokot-S) tablet 1 tablet, 1 tablet, Oral, BID, Florencia Reasons, MD, 1 tablet at 09/23/18 0856 .  simethicone (MYLICON) chewable tablet 80 mg, 80 mg,  Oral, QID PRN, Lavina Hamman, MD .  thiamine (VITAMIN B-1) tablet 100 mg, 100 mg, Oral, Daily, Lavina Hamman, MD, 100 mg at 09/23/18 0857:  . azithromycin  500 mg Oral Q24H  . benzonatate  100 mg Oral BID  . feeding supplement (ENSURE ENLIVE)  237 mL Oral BID BM  . folic acid  1 mg Oral Daily  . guaiFENesin  600 mg Oral BID  . heparin  5,000 Units Subcutaneous Q8H  . metroNIDAZOLE  500 mg Oral Q8H  . nicotine  21 mg Transdermal Daily  . polyethylene glycol  17 g Oral Daily  . senna-docusate  1 tablet Oral BID  . thiamine  100 mg Oral Daily  :  No Known  Allergies:  FH:8 siblings, 2 children, no cancer  SOCIAL HISTORY:He lives alone in [place], smokes 1.5 packs of cigarettes per day, drinks beer daily  Review of Systems:  Positives include:productive cough-white sputum, 20lb weight loss  A complete ROS was otherwise negative.   Physical Exam:  Blood pressure (!) 97/59, pulse 91, temperature 97.8 F (36.6 C), temperature source Oral, resp. rate (!) 26, height 5\' 4"  (1.626 m), weight 120 lb 13 oz (54.8 kg), SpO2 100 %.  HEENT: Oral cavity without visible mass, neck without mass Lungs: Scattered inspiratory/expiratory rhonchi and wheezes, no respiratory distress, decreased breath sounds at the left anterior chest compared to the right side Cardiac: Distant heart sounds Abdomen: No hepatosplenomegaly, nontender GU: Testes without mass Vascular: No leg edema Lymph nodes: No cervical, supraclavicular, axillary, or inguinal nodes Neurologic: Alert and oriented, the motor exam appears intact in the upper and lower extremities Skin: No rash Musculoskeletal: No spine tenderness  LABS:  Recent Labs    09/22/18 0224 09/23/18 0225  WBC 21.0* 15.0*  HGB 10.0* 10.0*  HCT 30.9* 30.1*  PLT 277 272    Recent Labs    09/22/18 0224 09/23/18 0225  NA 133* 134*  K 3.1* 3.5  CL 106 106  CO2 21* 23  GLUCOSE 114* 134*  BUN <5* <5*  CREATININE 0.66 0.56*  CALCIUM 7.7* 7.7*      RADIOLOGY:  Dg Chest 2 View  Result Date: 09/19/2018 CLINICAL DATA:  couple of weeks when he stands up his "feet give out on me and I fall ." Patient states his home nurse reported orthostatic hypotension. EXAM: CHEST - 2 VIEW COMPARISON:  09/30/2014 FINDINGS: There is LEFT LOWER lobe collapse. Heart size is normal. No pulmonary edema. RIGHT lung is clear. There is atherosclerotic calcification of the thoracic aorta. IMPRESSION: 1. LEFT LOWER lobe collapse. 2. Consider central obstruction and infection. 3. Followup PA and lateral chest X-ray is recommended  in 3-4 weeks following treatment to ensure resolution and exclude underlying malignancy. Electronically Signed   By: Nolon Nations M.D.   On: 09/19/2018 17:24   Ct Head Wo Contrast  Result Date: 09/20/2018 CLINICAL DATA:  Small cell lung cancer. EXAM: CT HEAD WITHOUT CONTRAST TECHNIQUE: Contiguous axial images were obtained from the base of the skull through the vertex without intravenous contrast. COMPARISON:  None. FINDINGS: Brain: There is atrophy and chronic small vessel disease changes. No acute intracranial abnormality. Specifically, no hemorrhage, hydrocephalus, mass lesion, acute infarction, or significant intracranial injury. Vascular: No hyperdense vessel or unexpected calcification. Skull: No acute calvarial abnormality. Sinuses/Orbits: Visualized paranasal sinuses and mastoids clear. Orbital soft tissues unremarkable. Other: None IMPRESSION: No acute intracranial abnormality. Atrophy, chronic microvascular disease. Electronically Signed   By: Rolm Baptise M.D.  On: 09/20/2018 08:56   Ct Chest W Contrast  Result Date: 09/19/2018 CLINICAL DATA:  Left lower lobe collapse seen on x-ray. Acute respiratory illness. EXAM: CT CHEST WITH CONTRAST TECHNIQUE: Multidetector CT imaging of the chest was performed during intravenous contrast administration. CONTRAST:  37mL OMNIPAQUE IOHEXOL 300 MG/ML  SOLN COMPARISON:  Chest x-ray September 19, 2018 FINDINGS: Cardiovascular: There is atherosclerotic change in the thoracic aorta without dissection. No aneurysm. The heart size is normal. No obvious coronary artery calcifications. The main pulmonary artery is unremarkable. The left lower lobe central pulmonary artery is narrowed over a short distance in the infrahilar region before returning to more normal size. This is well seen on coronal image 80. Mediastinum/Nodes: The thyroid and esophagus are unremarkable. There is a left-sided pleural effusion. No pericardial effusion or right pleural effusion. Soft  tissue fullness in the inferior left hilum suspicious for a centrally obstructing mass. No other adenopathy identified. Lungs/Pleura: There is mucus in the distal trachea and mainstem bronchi. The trachea, mainstem bronchi, right-sided airways, and left upper lobe airways are normal. There is abrupt cut off of the left lower lobe bronchus with complete collapse of the left lower lobe. While it is difficult to discern a discrete mass, I am suspicious the abrupt cut off of the left lower lobe bronchus and left lower lobe collapse may be due to a centrally obstructing mass with soft tissue fullness in the infrahilar region. There is a small left-sided pleural effusion. No right-sided pleural effusion. No pneumothorax. There is a nodule in the right lower lobe on series 4, image 115 measuring 4 mm. No other pulmonary nodules, masses, or infiltrates. Upper Abdomen: No acute abnormality. Musculoskeletal: No chest wall abnormality. No acute or significant osseous findings. IMPRESSION: 1. The findings are concerning for a centrally obstructing mass/malignancy resulting in collapse of the left lower lobe. While a discrete mass cannot be measured, there is soft tissue fullness in the left infrahilar region. There is also abrupt cut off of the left lower lobe bronchus and narrowing of the left lower lobe pulmonary artery as it traverses this region. Recommend pulmonary consultation with consideration of bronchoscopy. 2. Atherosclerotic change in the thoracic aorta. 3. Small left-sided pleural effusion. 4. 4 mm nodule in the right lower lobe, too small to characterize. Aortic Atherosclerosis (ICD10-I70.0). Electronically Signed   By: Dorise Bullion III M.D   On: 09/19/2018 20:57   Dg Chest Port 1 View  Result Date: 09/21/2018 CLINICAL DATA:  Follow-up obstructive LEFT LOWER LOBE atelectasis and/or pneumonia. EXAM: PORTABLE CHEST 1 VIEW COMPARISON:  CT chest yesterday. Chest x-rays yesterday and 09/30/2014. FINDINGS: No  interval change in the complete atelectasis involving the LEFT LOWER LOBE. A small to moderate-sized LEFT pleural effusion which is likely increasing in size. No new pulmonary parenchymal abnormalities elsewhere. Cardiac silhouette normal in size, unchanged. Thoracic aorta atherosclerotic. Hilar and mediastinal contours otherwise unremarkable. IMPRESSION: 1. No change in the complete atelectasis involving the LEFT LOWER LOBE, likely post obstructive based on yesterday's CT. 2. Small LEFT pleural effusion which is increasing in size. 3. No new pulmonary parenchymal abnormalities. Electronically Signed   By: Evangeline Dakin M.D.   On: 09/21/2018 11:26   Dg Abd Portable 1v  Result Date: 09/21/2018 CLINICAL DATA:  62 year old male with history of abdominal cramping. EXAM: PORTABLE ABDOMEN - 1 VIEW COMPARISON:  No priors. FINDINGS: Gas and stool is noted throughout the colon. There is a gas present throughout the small bowel, without evidence of small bowel dilatation  or air-fluid levels. No definite pneumoperitoneum noted on this single supine image. IMPRESSION: 1. Nonspecific, nonobstructive bowel gas pattern. 2. No pneumoperitoneum. Electronically Signed   By: Vinnie Langton M.D.   On: 09/21/2018 16:41  CT chest images reviewed  Assessment and Plan:   1.  Left lower lobe collapse  CT chest 09/19/2018 concerning for an obstructing left lower lobe mass, small left pleural effusion, 4 mm right lower lobe nodule  CT head 09/20/2018- no acute abnormality 2.  Tobacco and alcohol use 3.  COPD 4.  Weight loss 5.  Anemia 6.  History of tuberculosis 7.  Leukocytosis secondary to pneumonia, tobacco use, and cancer   Ronald [name] is admitted with probable left lower lobe obstructive pneumonia.  He most likely has a left-sided lung cancer.  I discussed the probable diagnosis and diagnostic plan with Mr. Matera.  He has seen pulmonary medicine and will be scheduled for an outpatient bronchoscopy/PET.  I will  arrange for outpatient follow-up at the Cancer center after the staging evaluation.  Recommendations: 1.  Schedule bronchoscopy for as soon as possible 2.  Outpatient PET scan 3.  Antibiotics as recommended by pulmonary medicine 4.  Outpatient follow-up will be scheduled at the Cancer center for the week of 10/07/2018      Betsy Coder, MD 09/23/2018, 4:01 PM

## 2018-09-23 NOTE — Progress Notes (Signed)
Patient brought to PFT lab for pulmonary function testing.  Patient unable to perform maneuvers to achieve optimal results.  Unable to perform DLCO; attempted x1 when patient refused anymore testing.  Patient transported to his room with no complications.

## 2018-09-23 NOTE — Progress Notes (Signed)
PCCM:  Case discussed with Dr. Halford Chessman. We will plan outpatient bronchoscopy w/ EBUS on December 4th, 2019 @ ~2:30 case.  Someone from Pre-op/Short stay will arrange a pre-admit appointment.   Patient will have pre-op appointment with Lazaro Arms, NP on 09/27/2018.   [phone] Booking Number with OR   NPO midnight before. Hold all AC and antiplatelets prior.    Garner Nash, DO [facility] 09/23/2018 4:51 PM

## 2018-09-23 NOTE — Evaluation (Signed)
Physical Therapy Evaluation Patient Details Name: Ronald [name] MRN: [id number] DOB: November 20, 1955 Today's Date: 09/23/2018   History of Present Illness  62yo male presenting to the ED with increased weakness and lightheadedness, 30# weight loss over 2 months. Admitted for obstructive pneumonia, lung mass. PMH alcoholism, COPD, HTN, hx pulmonary TB   Clinical Impression   Patient received in bed, pleasant and willing to participate in therapy but very fatigued this afternoon. Able to complete functional bed mobility, transfers with RW, and gait 50f in room with RW with S-min guard without difficulty, but somewhat limited by fatigue and weakness today. SpO2 at 97% on 2LPM at rest, dropped to 91% with activity, did not assess on room air this session. He was left in bed with all needs met, bed alarm active and RN aware of patient status. He will continue to benefit from skilled PT services in the acute setting as well as skilled HHPT services moving forward.     Follow Up Recommendations Home health PT    Equipment Recommendations  Other (comment)(shower seat, has all necessary DME )    Recommendations for Other Services       Precautions / Restrictions Precautions Precautions: Other (comment) Precaution Comments: watch SPO2  Restrictions Weight Bearing Restrictions: No      Mobility  Bed Mobility Overal bed mobility: Needs Assistance Bed Mobility: Supine to Sit;Sit to Supine     Supine to sit: Supervision Sit to supine: Supervision   General bed mobility comments: S for safety and line management   Transfers Overall transfer level: Needs assistance Equipment used: Rolling walker (2 wheeled) Transfers: Sit to/from Stand Sit to Stand: Supervision         General transfer comment: S for safety, assist for line management   Ambulation/Gait Ambulation/Gait assistance: Supervision Gait Distance (Feet): 30 Feet Assistive device: Rolling walker (2 wheeled) Gait  Pattern/deviations: Step-through pattern;Decreased step length - right;Decreased step length - left;Decreased stride length;Decreased dorsiflexion - left;Decreased dorsiflexion - right Gait velocity: decreased    General Gait Details: reduced ankle ROM during gait due to past injury to B ankles, gross weakness noted during gait; SpO2 on 2LPM 97% at rest, dropped to 91% with activity   Stairs            Wheelchair Mobility    Modified Rankin (Stroke Patients Only)       Balance Overall balance assessment: Mild deficits observed, not formally tested                                           Pertinent Vitals/Pain Pain Assessment: No/denies pain    Home Living Family/patient expects to be discharged to:: Private residence(plans to go live with his sister ) Living Arrangements: Other relatives Available Help at Discharge: Family;Available PRN/intermittently Type of Home: House Home Access: Level entry     Home Layout: One level Home Equipment: Walker - 2 wheels;Wheelchair - manual      Prior Function Level of Independence: Independent with assistive device(s)               Hand Dominance        Extremity/Trunk Assessment   Upper Extremity Assessment Upper Extremity Assessment: Generalized weakness    Lower Extremity Assessment Lower Extremity Assessment: Generalized weakness    Cervical / Trunk Assessment Cervical / Trunk Assessment: Normal  Communication   Communication: No difficulties  Cognition  Arousal/Alertness: Awake/alert Behavior During Therapy: WFL for tasks assessed/performed Overall Cognitive Status: Within Functional Limits for tasks assessed                                        General Comments      Exercises     Assessment/Plan    PT Assessment Patient needs continued PT services  PT Problem List Decreased strength;Decreased knowledge of use of DME;Decreased activity tolerance;Decreased  safety awareness;Decreased balance;Decreased mobility;Decreased coordination       PT Treatment Interventions DME instruction;Balance training;Gait training;Neuromuscular re-education;Stair training;Functional mobility training;Patient/family education;Therapeutic activities;Therapeutic exercise    PT Goals (Current goals can be found in the Care Plan section)  Acute Rehab PT Goals Patient Stated Goal: go home with sister  PT Goal Formulation: With patient Time For Goal Achievement: 10/07/18 Potential to Achieve Goals: Good    Frequency Min 3X/week   Barriers to discharge        Co-evaluation               AM-PAC PT "6 Clicks" Mobility  Outcome Measure Help needed turning from your back to your side while in a flat bed without using bedrails?: A Little Help needed moving from lying on your back to sitting on the side of a flat bed without using bedrails?: A Little Help needed moving to and from a bed to a chair (including a wheelchair)?: A Little Help needed standing up from a chair using your arms (e.g., wheelchair or bedside chair)?: A Little Help needed to walk in hospital room?: A Little Help needed climbing 3-5 steps with a railing? : A Little 6 Click Score: 18    End of Session Equipment Utilized During Treatment: Oxygen Activity Tolerance: Patient tolerated treatment well;Patient limited by fatigue Patient left: in bed;with call bell/phone within reach;with bed alarm set   PT Visit Diagnosis: Unsteadiness on feet (R26.81);Muscle weakness (generalized) (M62.81)    Time: 1500-1520 PT Time Calculation (min) (ACUTE ONLY): 20 min   Charges:   PT Evaluation $PT Eval Low Complexity: 1 Low          Deniece Ree PT, DPT, CBIS  Supplemental Physical Therapist [place]    Pager [phone] Acute Rehab Office [phone]

## 2018-09-23 NOTE — Progress Notes (Addendum)
   NAME:  Ronald [name], MRN:  [id number], DOB:  1956/06/27, LOS: 4 ADMISSION DATE:  09/19/2018, CONSULTATION DATE:  09/23/2018  REFERRING MD:  TRiad MD, CHIEF COMPLAINT:  Lung mass   Brief History   Smoker LLL lung mass consult  History of present illness   62 -year-old male admitted 11/21 w/ cc:  Weakness and wt loss. CT chest that shows left lower lobe collapse and an obstructive pattern raising the question of endobronchial lesion and surrounding hilar adenopathy and concerning for malignancy so PCCM asked to eval   PMH  Smoker, ETOH,   Significant Hospital Events   09/19/2018  - admit 09/20/18 - consult  Consults:  11/22 - pulm consult  Procedures:    Significant Diagnostic Tests:    Micro Data:    Antimicrobials:    Interim history/subjective:  Feels better; wants to go home.   Objective   Blood pressure (Abnormal) 97/59, pulse 91, temperature 97.8 F (36.6 C), temperature source Oral, resp. rate (Abnormal) 26, height 5\' 4"  (1.626 m), weight 54.8 kg, SpO2 100 %.        Intake/Output Summary (Last 24 hours) at 09/23/2018 1441 Last data filed at 09/23/2018 1400 Gross per 24 hour  Intake 640 ml  Output 775 ml  Net -135 ml   Filed Weights   09/19/18 1459 09/20/18 0144  Weight: 49.9 kg 54.8 kg    Examination: General 62 year old white male resting in bed no acute distress HENT NCAT no JVD MMM pulm scattered rhonchi and wheeze no accessory use  Card RRR no MRG abd soft not tender + bowel sounds Ext no edema brisk CR  Neuro intact    Resolved Hospital Problem list    Assessment & Plan:   Left lower lobe lung mass in a smoker with post-obstructive atelectasis  -most likely lung cancer Plan Will We have him scheduled for bronch on Wednesday 27th at [place] to  see if we can arrange bronchoscopy for tissue sampling and airway evaluation in effort to expedite option for xrt     Erick Colace ACNP-BC [place] Pager #  [phone] OR # [phone] if no answer

## 2018-09-23 NOTE — Progress Notes (Signed)
Nutrition Follow-up  DOCUMENTATION CODES:   Severe malnutrition in context of chronic illness  INTERVENTION:   - Continue Ensure Enlive BID (each provides 350 kcal and 20 g protein) - Continue thiamine and folvite given h/o significant etoh use   NUTRITION DIAGNOSIS:   Severe Malnutrition related to acute illness, chronic illness as evidenced by percent weight loss, moderate fat depletion, moderate muscle depletion.  GOAL:   Patient will meet greater than or equal to 90% of their needs  Not meeting  MONITOR:   PO intake, Supplement acceptance, Skin, Weight trends, Labs, I & O's  PO intake: 25-100% 2 meals completed Supplement acceptance: good; continue as ordered Labs: sodium 134, glucose 134, creatinine 0.56, BUN <5, tProtein 5.1, Hgb 10.0, Hct 30.1% Weight trends: no new wt since 11/22 Skin: ecchymosis on arms I&O's:  Intake/Output Summary (Last 24 hours) at 09/23/2018 1117 Last data filed at 09/23/2018 5974 Gross per 24 hour  Intake 340 ml  Output 375 ml  Net -35 ml   ASSESSMENT:    62 y.o. Male with PMH significant of alcoholism, COPD, hypertension, pulmonary TB, cigarette smoker since his teenage years who is coming to the ED with progressively worse weakness and lightheadedness and unintentional weight loss.  11/25 follow-up:  Meds: Ensure Enlive BID, Senokot-S BID, folvite 1 mg daily, thiamine 100 mg daily  Pt reports UBW ~140# (63.6 kg) about 1 month ago (2 months, per H&P) --> 14% wt loss significant for either time frame.  Pt enjoying Ensure supplements. Encouraged pt to include protein-rich foods with every meal and to eat those foods first if appetite is poor.  NUTRITION - FOCUSED PHYSICAL EXAM:    Most Recent Value  Orbital Region  Mild depletion  Upper Arm Region  Severe depletion  Thoracic and Lumbar Region  Moderate depletion  Buccal Region  Moderate depletion  Temple Region  Mild depletion  Clavicle Bone Region  Moderate depletion   Clavicle and Acromion Bone Region  Moderate depletion  Scapular Bone Region  Moderate depletion  Dorsal Hand  Moderate depletion  Patellar Region  Moderate depletion  Anterior Thigh Region  Severe depletion  Posterior Calf Region  Moderate depletion  Edema (RD Assessment)  None  Hair  Reviewed  Eyes  Reviewed  Mouth  Reviewed  Skin  Other (Comment) dry, mild ecchymosis  Nails  Other (Comment) pale      Diet Order:  25-100% of last 2 meals completed, per nsg documentation Diet Order            Diet regular Room service appropriate? Yes; Fluid consistency: Thin  Diet effective now              EDUCATION NEEDS:  No education needs have been identified at this time  Skin:  Skin Assessment: Reviewed RN Assessment  Last BM:  11/24, type 7  Height:  Ht Readings from Last 1 Encounters:  09/20/18 5\' 4"  (1.626 m)    Weight:  Wt Readings from Last 1 Encounters:  09/20/18 54.8 kg    Ideal Body Weight:  59.1 kg  BMI:  Body mass index is 20.74 kg/m.  Estimated Nutritional Needs:   Kcal:  1800-2000  Protein:  90-105 gm  Fluid:  1.8-2.0 L  Althea Grimmer, MS, RDN, LDN Pager: [phone]

## 2018-09-23 NOTE — Care Management Important Message (Signed)
Important Message  Patient Details  Name: ODAI WIMMER MRN: [id number] Date of Birth: 1956-03-07   Medicare Important Message Given:  Yes    Illianna Paschal Montine Circle 09/23/2018, 3:44 PM

## 2018-09-23 NOTE — Progress Notes (Signed)
SATURATION QUALIFICATIONS: (This note is used to comply with regulatory documentation for home oxygen) ° °Patient Saturations on Room Air at Rest = 94% ° °Patient Saturations on Room Air while Ambulating = 86% ° °Patient Saturations on 2 Liters of oxygen while Ambulating = 93% ° °Please briefly explain why patient needs home oxygen: °

## 2018-09-23 NOTE — Progress Notes (Signed)
PROGRESS NOTE  Ronald [name] EHM:[id number] DOB: 08/22/1956 DOA: 09/19/2018 PCP: Patient, No Pcp Per  HPI/Recap of past 24 hours:  Reports feeling better, cough is easier and more productive, he denies pain, no fever  Assessment/Plan: Principal Problem:   Obstructive pneumonia Active Problems:   Abnormal LFTs   Hyponatremia   COPD (chronic obstructive pulmonary disease) (HCC)   Hypertension   Alcoholism ([place])   Anemia   Lung mass   Abnormal ECG   CAD (coronary artery disease)   Protein-calorie malnutrition, severe   Obstructive left lower lobe lung mass/post obstructive pna/acute hypoxic respiratory failure, severe sepsis on presentation -Ct chest on admission "a centrally obstructing mass/malignancy resulting in collapse of the left lower lobe. While a discrete mass cannot be measured, there is soft tissue fullness in the left infrahilar region. There is also abrupt cut off of the left lower lobe bronchus and narrowing of the left lower lobe pulmonary artery as it traverses this region. Recommend pulmonary consultation with consideration of bronchoscopy." -continue Rocephin Flagyl and Zithromax for now, blood culture no growth, MRSA screening negative, sputum culture pending collection -Persistent leukocytosis but appear improving slowly , WBC peaked at 26.5 today's 15 -continue Mucinex, continue chest PT, wean oxygen as tolerated  -pulmonology consulted  COPD (chronic obstructive pulmonary disease) (HCC) Supplemental oxygen and bronchodilators as needed Currently no wheezing  Chronic diastolic CHF - -Echo with preserved left ventricular EF with grade 1 diastolic dysfunction -currently dry, with orthostatic hypotension, will restart ivf, close monitor volume status  Hyponatremia  -sodium 130 on presentation -imProving today is 134  Hypokalemia/hypomagnesemia -Replace K and mag - repeat labs in the morning  Normocytic anemia -Hemoglobin 10 no overt bleeding -retic  inappropriately low, likely component of anemia of chronic disease -Monitor  elevated LFT (AST>ALT) on presentation likely from sepsis and And possible for alcohol -normalized  Constipation/but he has signs of overflow diarrhea -KUB with large stool burden in the colon,  -tsh wnl -continue stool softener   Alcoholism ([place])  family and the patient initially told that he was only drinking 1 beer a day, but now the that he is drinking 18 beers a day. He used to drink a lot more until about a month ago. He denies history of DTs or prior detox admissions to the hospital. Continue CIWA protocol. No overt withdrawal symptom in the hospital  Impaired memory -CT had no acute findings, does has "Atrophy, chronic microvascular disease" -monitor  Cigarette smoking Smoking cessation education provided nicotine patch   Severe malnutrition in context of chronic illness Nutrition input apprecitated   FTT: reports baseline use wheelchair and walkers due to prior history of bilateral ankle fractures Get PT eval, will likely needs home health  Tele unremarkable, he is improving, will d/c tele.  Code Status: full  Family Communication: patient   Disposition Plan:  need to wean oxygen and get physical therapy Patient reports previously he lives by himself he plan to go to his sister's house after discharge Likely will need home health, may need home 02,  Plan discharge on 11/26 if no plan to bronch while he is in the hospital  Consultants:  Pulmonary critical care  Case discussed with oncology oncall Dr Benay Spice   Procedures:  None  Antibiotics:  As above   Objective: BP (!) 97/59   Pulse 91   Temp 97.8 F (36.6 C) (Oral)   Resp (!) 26   Ht 5\' 4"  (1.626 m)   Wt 54.8 kg  SpO2 100%   BMI 20.74 kg/m   Intake/Output Summary (Last 24 hours) at 09/23/2018 1409 Last data filed at 09/23/2018 3875 Gross per 24 hour  Intake 340 ml  Output 375 ml  Net -35 ml   Filed  Weights   09/19/18 1459 09/20/18 0144  Weight: 49.9 kg 54.8 kg    Exam: Patient is examined daily including today on 09/23/2018, exams remain the same as of yesterday except that has changed    General:  NAD, not oriented to the month  Cardiovascular: RRR  Respiratory: diminished on the right, with rhonchi   Abdomen: Soft/ND/NT, positive BS  Musculoskeletal: No Edema  Neuro: alert, oriented to year, place  Data Reviewed: Basic Metabolic Panel: Recent Labs  Lab 09/19/18 1624 09/20/18 1012 09/21/18 0217 09/22/18 0224 09/23/18 0225  NA 130* 132* 132* 133* 134*  K 3.6 3.2* 3.7 3.1* 3.5  CL 93* 109 106 106 106  CO2 26 19* 21* 21* 23  GLUCOSE 176* 95 114* 114* 134*  BUN 8 5* 5* <5* <5*  CREATININE 0.78 0.57* 0.68 0.66 0.56*  CALCIUM 8.6* 7.2* 7.5* 7.7* 7.7*  MG 2.0  --  1.9 1.7 2.0  PHOS 3.1  --   --   --   --    Liver Function Tests: Recent Labs  Lab 09/19/18 1624 09/20/18 1012 09/21/18 0217 09/22/18 0224  AST 75* 54* 65* 27  ALT 54* 38 42 28  ALKPHOS 124 99 114 90  BILITOT 0.7 0.4 0.4 0.6  PROT 6.9 5.2* 5.5* 5.1*  ALBUMIN 2.2* 1.5* 1.7* 1.5*   Recent Labs  Lab 09/19/18 1624  LIPASE 25   No results for input(s): AMMONIA in the last 168 hours. CBC: Recent Labs  Lab 09/19/18 1624 09/20/18 1012 09/21/18 0217 09/22/18 0224 09/23/18 0225  WBC 26.5* 12.4* 22.7* 21.0* 15.0*  NEUTROABS 23.8* 9.6* 18.9* 17.4* 11.9*  HGB 12.1* 10.4* 10.8* 10.0* 10.0*  HCT 36.2* 31.7* 33.7* 30.9* 30.1*  MCV 96.0 96.6 99.1 97.2 95.9  PLT 307 271 281 277 272   Cardiac Enzymes:   Recent Labs  Lab 09/20/18 1012  TROPONINI <0.03   BNP (last 3 results) Recent Labs    09/19/18 1624  BNP 77.0    ProBNP (last 3 results) No results for input(s): PROBNP in the last 8760 hours.  CBG: No results for input(s): GLUCAP in the last 168 hours.  Recent Results (from the past 240 hour(s))  Culture, blood (Routine X 2) w Reflex to ID Panel     Status: None (Preliminary  result)   Collection Time: 09/19/18 10:00 PM  Result Value Ref Range Status   Specimen Description BLOOD RIGHT ARM  Final   Special Requests   Final    BOTTLES DRAWN AEROBIC AND ANAEROBIC Blood Culture adequate volume   Culture   Final    NO GROWTH 4 DAYS Performed at [facility], [address], [place], [place] [address]    Report Status PENDING  Incomplete  Culture, blood (Routine X 2) w Reflex to ID Panel     Status: None (Preliminary result)   Collection Time: 09/19/18 10:05 PM  Result Value Ref Range Status   Specimen Description BLOOD RIGHT ARM  Final   Special Requests   Final    BOTTLES DRAWN AEROBIC AND ANAEROBIC Blood Culture adequate volume   Culture   Final    NO GROWTH 4 DAYS Performed at [facility], [address], [place], [place] [address]    Report  Status PENDING  Incomplete  MRSA PCR Screening     Status: None   Collection Time: 09/20/18  1:44 AM  Result Value Ref Range Status   MRSA by PCR NEGATIVE NEGATIVE Final    Comment:        The GeneXpert MRSA Assay (FDA approved for NASAL specimens only), is one component of a comprehensive MRSA colonization surveillance program. It is not intended to diagnose MRSA infection nor to guide or monitor treatment for MRSA infections. Performed at [facility], [place] [address] [place], [place] [address]      Studies: No results found.  Scheduled Meds: . azithromycin  500 mg Oral Q24H  . benzonatate  100 mg Oral BID  . feeding supplement (ENSURE ENLIVE)  237 mL Oral BID BM  . folic acid  1 mg Oral Daily  . guaiFENesin  600 mg Oral BID  . heparin  5,000 Units Subcutaneous Q8H  . metroNIDAZOLE  500 mg Oral Q8H  . nicotine  21 mg Transdermal Daily  . polyethylene glycol  17 g Oral Daily  . senna-docusate  1 tablet Oral BID  . thiamine  100 mg Oral Daily    Continuous Infusions: . sodium chloride 75 mL/hr at 09/23/18 1121  . cefTRIAXone (ROCEPHIN)  IV 1 g (09/22/18 2202)     Time spent: 27mins,    I have personally reviewed and interpreted on  09/23/2018 daily labs, tele strips, imagings as discussed above under date review session and assessment and plans.  I reviewed all nursing notes, pharmacy notes, consultant notes,  vitals, pertinent old records  I have discussed plan of care as described above with RN , patient on 09/23/2018   Florencia Reasons MD, PhD  [facility] Pager [phone]. If 7PM-7AM, please contact night-coverage at [email or web address], password [facility] 09/23/2018, 2:09 PM  LOS: 4 days

## 2018-09-24 ENCOUNTER — Telehealth: Payer: Self-pay | Admitting: Oncology

## 2018-09-24 ENCOUNTER — Encounter: Payer: Self-pay | Admitting: Oncology

## 2018-09-24 ENCOUNTER — Encounter: Payer: Self-pay | Admitting: *Deleted

## 2018-09-24 DIAGNOSIS — R911 Solitary pulmonary nodule: Secondary | ICD-10-CM

## 2018-09-24 LAB — QUANTIFERON-TB GOLD PLUS: QuantiFERON-TB Gold Plus: NEGATIVE

## 2018-09-24 LAB — CULTURE, BLOOD (ROUTINE X 2)
Culture: NO GROWTH
Culture: NO GROWTH
SPECIAL REQUESTS: ADEQUATE
Special Requests: ADEQUATE

## 2018-09-24 LAB — CBC WITH DIFFERENTIAL/PLATELET
Abs Immature Granulocytes: 0.09 10*3/uL — ABNORMAL HIGH (ref 0.00–0.07)
Basophils Absolute: 0.1 10*3/uL (ref 0.0–0.1)
Basophils Relative: 1 %
Eosinophils Absolute: 0.1 10*3/uL (ref 0.0–0.5)
Eosinophils Relative: 1 %
HCT: 32.3 % — ABNORMAL LOW (ref 39.0–52.0)
Hemoglobin: 10.3 g/dL — ABNORMAL LOW (ref 13.0–17.0)
Immature Granulocytes: 1 %
Lymphocytes Relative: 16 %
Lymphs Abs: 2.1 10*3/uL (ref 0.7–4.0)
MCH: 31 pg (ref 26.0–34.0)
MCHC: 31.9 g/dL (ref 30.0–36.0)
MCV: 97.3 fL (ref 80.0–100.0)
Monocytes Absolute: 0.8 10*3/uL (ref 0.1–1.0)
Monocytes Relative: 6 %
Neutro Abs: 10 10*3/uL — ABNORMAL HIGH (ref 1.7–7.7)
Neutrophils Relative %: 75 %
Platelets: 327 10*3/uL (ref 150–400)
RBC: 3.32 MIL/uL — ABNORMAL LOW (ref 4.22–5.81)
RDW: 13 % (ref 11.5–15.5)
WBC: 13.2 10*3/uL — ABNORMAL HIGH (ref 4.0–10.5)
nRBC: 0 % (ref 0.0–0.2)

## 2018-09-24 LAB — BASIC METABOLIC PANEL
Anion gap: 5 (ref 5–15)
BUN: <5 mg/dL — ABNORMAL LOW (ref 8–23)
CHLORIDE: 106 mmol/L (ref 98–111)
CO2: 25 mmol/L (ref 22–32)
CREATININE: 0.59 mg/dL — AB (ref 0.61–1.24)
Calcium: 8.1 mg/dL — ABNORMAL LOW (ref 8.9–10.3)
GFR calc non Af Amer: >60 mL/min (ref >60)
Glucose, Bld: 90 mg/dL (ref 70–99)
Potassium: 4.2 mmol/L (ref 3.5–5.1)
SODIUM: 136 mmol/L (ref 135–145)

## 2018-09-24 LAB — QUANTIFERON-TB GOLD PLUS (RQFGPL)
QUANTIFERON TB2 AG VALUE: 0.08 [IU]/mL
QuantiFERON Mitogen Value: 0.68 IU/mL
QuantiFERON Nil Value: 0.07 IU/mL
QuantiFERON TB1 Ag Value: 0.08 IU/mL

## 2018-09-24 LAB — MAGNESIUM: Magnesium: 1.8 mg/dL (ref 1.7–2.4)

## 2018-09-24 MED ORDER — ENSURE ENLIVE PO LIQD: 237.0000 mL | Freq: Two times a day (BID) | ORAL | 12 refills | Status: DC

## 2018-09-24 MED ORDER — FOLIC ACID 1 MG PO TABS: 1.0000 mg | ORAL_TABLET | Freq: Every day | ORAL | 0 refills | Status: DC

## 2018-09-24 MED ORDER — AMOXICILLIN-POT CLAVULANATE 875-125 MG PO TABS: 1.0000 | ORAL_TABLET | Freq: Two times a day (BID) | ORAL | 0 refills | Status: AC

## 2018-09-24 MED ORDER — THIAMINE HCL 100 MG PO TABS: 100.0000 mg | ORAL_TABLET | Freq: Every day | ORAL | 0 refills | Status: DC

## 2018-09-24 MED ORDER — SENNOSIDES-DOCUSATE SODIUM 8.6-50 MG PO TABS: 1.0000 | ORAL_TABLET | Freq: Every day | ORAL | 0 refills | Status: DC

## 2018-09-24 MED ORDER — MAGNESIUM GLUCONATE 30 MG PO TABS: 30.0000 mg | ORAL_TABLET | Freq: Every day | ORAL | 0 refills | Status: DC

## 2018-09-24 MED ORDER — ALBUTEROL SULFATE HFA 108 (90 BASE) MCG/ACT IN AERS: 2.0000 | INHALATION_SPRAY | Freq: Four times a day (QID) | RESPIRATORY_TRACT | 2 refills | Status: DC | PRN

## 2018-09-24 MED ORDER — GUAIFENESIN ER 600 MG PO TB12: 600.0000 mg | ORAL_TABLET | Freq: Two times a day (BID) | ORAL | 0 refills | Status: DC

## 2018-09-24 NOTE — Care Management Note (Signed)
Case Management Note  Patient Details  Name: Ronald [name] MRN: [id number] Date of Birth: 1956-04-13  Subjective/Objective:   Home alone, obstructive pna, lung mass, No PCP listed, NCM asked patient who is PCP, he states he has a lung MD but would like for me to speak with his sister Ronald [name] about that, she will be here shortly.  NCM left [facility] agency list with patient.  He also will need home oxygen, Butch Penny with [facility] notified, will have oxygen in am.  11/26 Tomi Bamberger RN, BSN - patient for dc today, will need HHPT, NCM spoke with sister, Ronald [name] [phone] she would like to work with [facility], referral given to Dorian Pod , soc will begin 24-48 hrs post dc. NCM informed her that he will also be going home with home oxygen thru [facility].  Ronald [name] states he will be going home with her at dc to address [address], [place], [address] for the holidays but he will be home on Monday and she would like HHPT to start then.                 Action/Plan: DC home when ready.   Expected Discharge Date:  09/24/18               Expected Discharge Plan:  [place]  In-House Referral:     Discharge planning Services  CM Consult  Post Acute Care Choice:  Home Health Choice offered to:  Sibling  DME Arranged:  Oxygen DME Agency:  [place]:  PT [facility] Agency:  [facility]  Status of Service:  Completed, signed off  If discussed at [place] of Stay Meetings, dates discussed:    Additional Comments:  Zenon Mayo, RN 09/24/2018, 9:38 AM

## 2018-09-24 NOTE — Discharge Summary (Signed)
Discharge Summary  Ronald [name] MOQ:[id number] DOB: [birth year]  PCP: Patient, No Pcp Per  Admit date: 09/19/2018 Discharge date: 09/24/2018  Time spent: 67mins, more than 50% time spent on coordination of care.  Recommendations for Outpatient Follow-up:  1. F/u with PCP within a week  for hospital discharge follow up, repeat cbc/bmp at follow up 2. F/u with pulmonology on 11/29 then on 12/4 for bronchoscopy 3. F/u with oncology on 12/11 4. Home health and home o2  Discharge Diagnoses:  Active Hospital Problems   Diagnosis Date Noted  . Obstructive pneumonia 09/19/2018  . Protein-calorie malnutrition, severe 09/23/2018  . Abnormal ECG 09/20/2018  . CAD (coronary artery disease) 09/20/2018  . Abnormal LFTs 09/19/2018  . Hyponatremia 09/19/2018  . COPD (chronic obstructive pulmonary disease) ([place]) 09/19/2018  . Hypertension 09/19/2018  . Alcoholism ([place]) 09/19/2018  . Anemia 09/19/2018  . Lung mass 09/19/2018    Resolved Hospital Problems  No resolved problems to display.    Discharge Condition: stable  Diet recommendation: regular diet  Filed Weights   09/19/18 1459 09/20/18 0144  Weight: 49.9 kg 54.8 kg    History of present illness: (per admitting MD Dr Olevia Bowens) PCP: Patient, No Pcp Per   Patient coming from: Home.  I have personally briefly reviewed patient's old medical records in [place]  Chief Complaint: Weakness and weight loss.  HPI: Ronald [name] is a 62 y.o. male with medical history significant of alcoholism, COPD, hypertension, pulmonary TB, cigarette smoker since his teenage years who is coming to the emergency department with progressively worse weakness and lightheadedness for the past 2 weeks associated with a 30 pound weight loss in the last 2 months.  He complains of fatigue and frequent wheezing.  He has productive cough for yellowish sputum.  He denies fever, chills, sore throat, rhinorrhea or hemoptysis.  He denies chest pain,  palpitations, diaphoresis, PND, orthopnea or pitting edema of the lower extremities.  No abdominal pain, nausea, emesis, diarrhea, constipation, melena or hematochezia.  Denies dysuria, frequency or hematuria.  Denies heat or cold intolerance.  Denies polyuria, polydipsia, polyphagia or blurred vision.  ED Course: Initial vital signs temperature 97.6 F, pulse 123, respirations 20, blood pressure 99/55 mmHg and O2 sat 95% on room air.  The patient received supplemental oxygen, 3000 mL of NS bolus, 20 mEq of KCl with 1 of the boluses, ceftriaxone, Zithromax IV.  His urinalysis show an amber color, hazy appearance, small bilirubin, 30 mg/dL proteinuria, rare bacteria and 6-10 WBC per hpf.  White count was 26.5 on his CBC with differential 90% neutrophils, 4% lymphocytes and 5% monocytes.  Hemoglobin was 12.1 g/dL and platelets 307.  BMP, lipase, alcohol level, TSH, magnesium, phosphorus and lactic acid were within normal limits.  His sodium was 130, potassium 3.6, chloride 93 and CO2 26 mmol/L.  BUN 8, creatinine 0.78, calcium 8.6 and glucose 176 mg/dL.  Total protein is 6.9 and albumin 2.2 g/dL.  AST 75 ALT 54.  The rest of the CMP is within normal limits.  Imaging: Chest radiograph and CT chest with contrast showed obstructing pneumonia and mass on LLL.  Please see images and full radiology report for further detail.  Hospital Course:  Principal Problem:   Obstructive pneumonia Active Problems:   Abnormal LFTs   Hyponatremia   COPD (chronic obstructive pulmonary disease) (HCC)   Hypertension   Alcoholism ([place])   Anemia   Lung mass   Abnormal ECG   CAD (coronary artery disease)  Protein-calorie malnutrition, severe   Obstructive left lower lobe lung mass/post obstructive pna/acute hypoxic respiratory failure, severe sepsis on presentation -Ct chest on admission "a centrally obstructing mass/malignancy resulting in collapse of the left lower lobe. While a discrete mass cannot be measured,  there is soft tissue fullness in the left infrahilar region. There is also abrupt cut off of the left lower lobe bronchus and narrowing of the left lower lobe pulmonary artery as it traverses this region. Recommend pulmonary consultation with consideration of bronchoscopy." -he received Rocephin Flagyl and Zithromax in the hospital, blood culture no growth, MRSA screening negative, sputum culture not collected -leukocytosis is improving, WBC peaked at 26.5 today at discharge is 13.2 -pulmonology and oncology consulted, patient elected to have biopsy after the holiday, follow up scheduled as mentioned above  -he is discharged home on augmentin, home o2  COPD (chronic obstructive pulmonary disease) (HCC) Supplemental oxygen and bronchodilators as needed Currently no wheezing  Chronic diastolic CHF - Echo with preserved left ventricular EF with grade 1 diastolic dysfunction - dry with orthostatic hypotension in the hospital, improved with  ivf -euvolemic at discharge  Hyponatremia  -sodium 130 on presentation -normalized, today is 136    Hypokalemia/hypomagnesemia - K and mag replaced and improved   Normocytic anemia -Hemoglobin 10 no overt bleeding -retic inappropriately low, likely component of anemia of chronic disease -Monitor  elevated LFT (AST>ALT) on presentation likely from sepsis and And possible for alcohol -normalized  Constipation/but he has signs of overflow diarrhea -KUB with large stool burden in the colon,  -tsh wnl -continue stool softener   Alcoholism ([place]) family and the patient initially told that he was only drinking 1 beer a day, but now the that he is drinking 18 beers a day. He used to drink a lot more until about a month ago. He denies history of DTs or prior detox admissions to the hospital. Continue CIWA protocol. No overt withdrawal symptom in the hospital  Impaired memory -CT had no acute findings, does has "Atrophy, chronic  microvascular disease" -aaox3 at discharge, does has impaired memory  Cigarette smoking Smoking cessation education provided nicotine patch   Severe malnutrition in context of chronic illness Nutrition input apprecitated   FTT: reports baseline use wheelchair and walkers due to prior history of bilateral ankle fractures PT recommended home health   Code Status: full  Family Communication: patient   Disposition Plan:  home with hone oxygen and physical therapy   Consultants:  Pulmonary critical care   oncology  Dr Benay Spice   Procedures:  None  Antibiotics:  As above   Discharge Exam: BP 103/66 (BP Location: Right Arm)   Pulse 91   Temp (!) 97.3 F (36.3 C) (Oral)   Resp (!) 22   Ht 5\' 4"  (1.626 m)   Wt 54.8 kg   SpO2 97%   BMI 20.74 kg/m   General: NAD Cardiovascular: RRR Respiratory: decreased on the left base, no wheezing  Discharge Instructions You were cared for by a hospitalist during your hospital stay. If you have any questions about your discharge medications or the care you received while you were in the hospital after you are discharged, you can call the unit and asked to speak with the hospitalist on call if the hospitalist that took care of you is not available. Once you are discharged, your primary care physician will handle any further medical issues. Please note that NO REFILLS for any discharge medications will be authorized once you are discharged,  as it is imperative that you return to your primary care physician (or establish a relationship with a primary care physician if you do not have one) for your aftercare needs so that they can reassess your need for medications and monitor your lab values.  Discharge Instructions    Diet general   Complete by:  As directed    Increase activity slowly   Complete by:  As directed      Allergies as of 09/24/2018   No Known Allergies     Medication List    TAKE these medications     albuterol 108 (90 Base) MCG/ACT inhaler Commonly known as:  PROVENTIL HFA;VENTOLIN HFA Inhale 2 puffs into the lungs every 6 (six) hours as needed for wheezing or shortness of breath.   amoxicillin-clavulanate 875-125 MG tablet Commonly known as:  AUGMENTIN Take 1 tablet by mouth 2 (two) times daily for 7 days.   feeding supplement (ENSURE ENLIVE) Liqd Take 237 mLs by mouth 2 (two) times daily between meals.   folic acid 1 MG tablet Commonly known as:  FOLVITE Take 1 tablet (1 mg total) by mouth daily. Start taking on:  09/25/2018   guaiFENesin 600 MG 12 hr tablet Commonly known as:  MUCINEX Take 1 tablet (600 mg total) by mouth 2 (two) times daily.   magnesium gluconate 30 MG tablet Commonly known as:  MAGONATE Take 1 tablet (30 mg total) by mouth daily.   senna-docusate 8.6-50 MG tablet Commonly known as:  Senokot-S Take 1 tablet by mouth at bedtime.   thiamine 100 MG tablet Take 1 tablet (100 mg total) by mouth daily. Start taking on:  09/25/2018            Durable Medical Equipment  (From admission, onward)         Start     Ordered   09/23/18 1625  DME Oxygen  Once    Question Answer Comment  Mode or (Route) Nasal cannula   Liters per Minute 2   Frequency Continuous (stationary and portable oxygen unit needed)   Oxygen conserving device Yes   Oxygen delivery system Gas      09/23/18 1624         No Known Allergies Follow-up Information    Fenton Foy, NP Follow up on 09/27/2018.   Specialty:  Pulmonary Disease Why:  915 am  Contact information: [address] [place] [address] [phone]        [facility] - Dme Follow up.   Why:  oxygen Contact information: [place] [address] [phone]        Ladell Pier, MD Follow up on 10/09/2018.   Specialty:  Oncology Contact information: [place] [address] [phone]        bronchoscopy with  biopsy Follow up on 10/02/2018.   Why:  NPO midnight before       [facility], [place] .   Contact information: [place] [address] [phone]        [facility] Follow up.   Why:  HHPT Contact information: [address] [place] [address] phone [phone]           The results of significant diagnostics from this hospitalization (including imaging, microbiology, ancillary and laboratory) are listed below for reference.    Significant Diagnostic Studies: Dg Chest 2 View  Result Date: 09/19/2018 CLINICAL DATA:  couple of weeks when he stands up his "feet give out on me and I fall ." Patient states his home nurse reported orthostatic hypotension. EXAM: CHEST - 2 VIEW COMPARISON:  09/30/2014 FINDINGS: There is LEFT LOWER lobe collapse. Heart size is normal. No pulmonary edema. RIGHT lung is clear. There is atherosclerotic calcification of the thoracic aorta. IMPRESSION: 1. LEFT LOWER lobe collapse. 2. Consider central obstruction and infection. 3. Followup PA and lateral chest X-ray is recommended in 3-4 weeks following treatment to ensure resolution and exclude underlying malignancy. Electronically Signed   By: Nolon Nations M.D.   On: 09/19/2018 17:24   Ct Head Wo Contrast  Result Date: 09/20/2018 CLINICAL DATA:  Small cell lung cancer. EXAM: CT HEAD WITHOUT CONTRAST TECHNIQUE: Contiguous axial images were obtained from the base of the skull through the vertex without intravenous contrast. COMPARISON:  None. FINDINGS: Brain: There is atrophy and chronic small vessel disease changes. No acute intracranial abnormality. Specifically, no hemorrhage, hydrocephalus, mass lesion, acute infarction, or significant intracranial injury. Vascular: No hyperdense vessel or unexpected calcification. Skull: No acute calvarial abnormality. Sinuses/Orbits: Visualized paranasal sinuses and mastoids clear. Orbital soft tissues unremarkable. Other: None  IMPRESSION: No acute intracranial abnormality. Atrophy, chronic microvascular disease. Electronically Signed   By: Rolm Baptise M.D.   On: 09/20/2018 08:56   Ct Chest W Contrast  Result Date: 09/19/2018 CLINICAL DATA:  Left lower lobe collapse seen on x-ray. Acute respiratory illness. EXAM: CT CHEST WITH CONTRAST TECHNIQUE: Multidetector CT imaging of the chest was performed during intravenous contrast administration. CONTRAST:  40mL OMNIPAQUE IOHEXOL 300 MG/ML  SOLN COMPARISON:  Chest x-ray September 19, 2018 FINDINGS: Cardiovascular: There is atherosclerotic change in the thoracic aorta without dissection. No aneurysm. The heart size is normal. No obvious coronary artery calcifications. The main pulmonary artery is unremarkable. The left lower lobe central pulmonary artery is narrowed over a short distance in the infrahilar region before returning to more normal size. This is well seen on coronal image 80. Mediastinum/Nodes: The thyroid and esophagus are unremarkable. There is a left-sided pleural effusion. No pericardial effusion or right pleural effusion. Soft tissue fullness in the inferior left hilum suspicious for a centrally obstructing mass. No other adenopathy identified. Lungs/Pleura: There is mucus in the distal trachea and mainstem bronchi. The trachea, mainstem bronchi, right-sided airways, and left upper lobe airways are normal. There is abrupt cut off of the left lower lobe bronchus with complete collapse of the left lower lobe. While it is difficult to discern a discrete mass, I am suspicious the abrupt cut off of the left lower lobe bronchus and left lower lobe collapse may be due to a centrally obstructing mass with soft tissue fullness in the infrahilar region. There is a small left-sided pleural effusion. No right-sided pleural effusion. No pneumothorax. There is a nodule in the right lower lobe on series 4, image 115 measuring 4 mm. No other pulmonary nodules, masses, or infiltrates. Upper  Abdomen: No acute abnormality. Musculoskeletal: No chest wall abnormality. No acute or significant osseous findings. IMPRESSION: 1. The findings are concerning for a centrally obstructing mass/malignancy resulting in collapse of the left lower lobe. While a discrete mass cannot be measured, there is soft tissue fullness in the left infrahilar region. There is also abrupt cut off of the left lower lobe bronchus and narrowing of the left lower lobe pulmonary artery as it traverses this region. Recommend pulmonary consultation with consideration of bronchoscopy. 2. Atherosclerotic change in the thoracic aorta. 3. Small left-sided  pleural effusion. 4. 4 mm nodule in the right lower lobe, too small to characterize. Aortic Atherosclerosis (ICD10-I70.0). Electronically Signed   By: Dorise Bullion III M.D   On: 09/19/2018 20:57   Dg Chest Port 1 View  Result Date: 09/21/2018 CLINICAL DATA:  Follow-up obstructive LEFT LOWER LOBE atelectasis and/or pneumonia. EXAM: PORTABLE CHEST 1 VIEW COMPARISON:  CT chest yesterday. Chest x-rays yesterday and 09/30/2014. FINDINGS: No interval change in the complete atelectasis involving the LEFT LOWER LOBE. A small to moderate-sized LEFT pleural effusion which is likely increasing in size. No new pulmonary parenchymal abnormalities elsewhere. Cardiac silhouette normal in size, unchanged. Thoracic aorta atherosclerotic. Hilar and mediastinal contours otherwise unremarkable. IMPRESSION: 1. No change in the complete atelectasis involving the LEFT LOWER LOBE, likely post obstructive based on yesterday's CT. 2. Small LEFT pleural effusion which is increasing in size. 3. No new pulmonary parenchymal abnormalities. Electronically Signed   By: Evangeline Dakin M.D.   On: 09/21/2018 11:26   Dg Abd Portable 1v  Result Date: 09/21/2018 CLINICAL DATA:  62 year old male with history of abdominal cramping. EXAM: PORTABLE ABDOMEN - 1 VIEW COMPARISON:  No priors. FINDINGS: Gas and stool is  noted throughout the colon. There is a gas present throughout the small bowel, without evidence of small bowel dilatation or air-fluid levels. No definite pneumoperitoneum noted on this single supine image. IMPRESSION: 1. Nonspecific, nonobstructive bowel gas pattern. 2. No pneumoperitoneum. Electronically Signed   By: Vinnie Langton M.D.   On: 09/21/2018 16:41    Microbiology: Recent Results (from the past 240 hour(s))  Culture, blood (Routine X 2) w Reflex to ID Panel     Status: None   Collection Time: 09/19/18 10:00 PM  Result Value Ref Range Status   Specimen Description BLOOD RIGHT ARM  Final   Special Requests   Final    BOTTLES DRAWN AEROBIC AND ANAEROBIC Blood Culture adequate volume   Culture   Final    NO GROWTH 5 DAYS Performed at [facility], [address] [place], [place] [address]    Report Status 09/24/2018 FINAL  Final  Culture, blood (Routine X 2) w Reflex to ID Panel     Status: None   Collection Time: 09/19/18 10:05 PM  Result Value Ref Range Status   Specimen Description BLOOD RIGHT ARM  Final   Special Requests   Final    BOTTLES DRAWN AEROBIC AND ANAEROBIC Blood Culture adequate volume   Culture   Final    NO GROWTH 5 DAYS Performed at [facility], [address] [place], [place] [address]    Report Status 09/24/2018 FINAL  Final  MRSA PCR Screening     Status: None   Collection Time: 09/20/18  1:44 AM  Result Value Ref Range Status   MRSA by PCR NEGATIVE NEGATIVE Final    Comment:        The GeneXpert MRSA Assay (FDA approved for NASAL specimens only), is one component of a comprehensive MRSA colonization surveillance program. It is not intended to diagnose MRSA infection nor to guide or monitor treatment for MRSA infections. Performed at [facility], [place] [address], [place], [place] [address]   Culture, sputum-assessment     Status: None   Collection Time: 09/23/18  4:09 PM  Result Value Ref Range Status   Specimen Description  EXPECTORATED SPUTUM  Final   Special Requests   Final    Immunocompromised Performed at [facility], [address] [place],  [address]  Sputum evaluation THIS SPECIMEN IS ACCEPTABLE FOR SPUTUM CULTURE  Final   Report Status 09/23/2018 FINAL  Final  Culture, respiratory     Status: None (Preliminary result)   Collection Time: 09/23/18  4:09 PM  Result Value Ref Range Status   Specimen Description EXPECTORATED SPUTUM  Final   Special Requests Immunocompromised Reflexed from [id number]  Final   Gram Stain   Final    ABUNDANT WBC PRESENT, PREDOMINANTLY PMN FEW SQUAMOUS EPITHELIAL CELLS PRESENT FEW GRAM POSITIVE COCCI FEW GRAM NEGATIVE RODS    Culture   Final    TOO YOUNG TO READ Performed at [facility], [place] [address], [place], [place] [address]    Report Status PENDING  Incomplete     Labs: Basic Metabolic Panel: Recent Labs  Lab 09/19/18 1624 09/20/18 1012 09/21/18 0217 09/22/18 0224 09/23/18 0225 09/24/18 0226  NA 130* 132* 132* 133* 134* 136  K 3.6 3.2* 3.7 3.1* 3.5 4.2  CL 93* 109 106 106 106 106  CO2 26 19* 21* 21* 23 25  GLUCOSE 176* 95 114* 114* 134* 90  BUN 8 5* 5* <5* <5* <5*  CREATININE 0.78 0.57* 0.68 0.66 0.56* 0.59*  CALCIUM 8.6* 7.2* 7.5* 7.7* 7.7* 8.1*  MG 2.0  --  1.9 1.7 2.0 1.8  PHOS 3.1  --   --   --   --   --    Liver Function Tests: Recent Labs  Lab 09/19/18 1624 09/20/18 1012 09/21/18 0217 09/22/18 0224  AST 75* 54* 65* 27  ALT 54* 38 42 28  ALKPHOS 124 99 114 90  BILITOT 0.7 0.4 0.4 0.6  PROT 6.9 5.2* 5.5* 5.1*  ALBUMIN 2.2* 1.5* 1.7* 1.5*   Recent Labs  Lab 09/19/18 1624  LIPASE 25   No results for input(s): AMMONIA in the last 168 hours. CBC: Recent Labs  Lab 09/20/18 1012 09/21/18 0217 09/22/18 0224 09/23/18 0225 09/24/18 0226  WBC 12.4* 22.7* 21.0* 15.0* 13.2*  NEUTROABS 9.6* 18.9* 17.4* 11.9* 10.0*  HGB 10.4* 10.8* 10.0* 10.0* 10.3*  HCT 31.7* 33.7* 30.9* 30.1* 32.3*  MCV 96.6 99.1 97.2 95.9 97.3    PLT 271 281 277 272 327   Cardiac Enzymes: Recent Labs  Lab 09/20/18 1012  TROPONINI <0.03   BNP: BNP (last 3 results) Recent Labs    09/19/18 1624  BNP 77.0    ProBNP (last 3 results) No results for input(s): PROBNP in the last 8760 hours.  CBG: No results for input(s): GLUCAP in the last 168 hours.     Signed:  Florencia Reasons MD, PhD  [facility] 09/24/2018, 10:50 AM

## 2018-09-24 NOTE — Progress Notes (Signed)
PT Cancellation Note  Patient Details Name: Ronald [name] MRN: [id number] DOB: [birth year]   Cancelled Treatment:    Reason Eval/Treat Not Completed: Other (comment).  Pt checked in with pt as he is d/c home today.  He did his O2 qualification sats with RN yesterday.  He reports he is going home with his sister and his O2 is in the room with him currently.  He is agreeable to home therapy and did not feel he needed to work on anything today before he left (with PT).  PT to follow acutely until d/c confirmed.    Thanks,  Ronald [name], PT, DPT  Acute Rehabilitation [phone] pager [phone]) [phone] office     Ronald [name] 09/24/2018, 11:25 AM

## 2018-09-24 NOTE — Care Management Note (Addendum)
Case Management Note  Patient Details  Name: Ronald [name] MRN: [id number] Date of Birth: 14-Dec-1955  Subjective/Objective:     Home alone, obstructive pna, lung mass, No PCP listed, NCM asked patient who is PCP, he states he has a lung MD but would like for me to speak with his sister [name] about that, she will be here shortly.NCM left [facility] agency list with patient.He also will need home oxygen, Butch Penny with [facility] notified, will have oxygen in am.  11/26 Tomi Bamberger RN, BSN - patient for dc today, will need HHPT, NCM spoke with sister, [name] [phone] she would like to work with [facility], referral given to Dorian Pod , soc will begin 24-48 hrs post dc. NCM informed her that he will also be going home with home oxygen thru [facility].  [name] states he will be going home with her at dc to address [address], [place], [address] for the holidays but he will be home on Monday and she would like HHPT to start then.   NCM received call from [facility] stating she can not take because they do not cover [place].  NCM referred to [facility] home health per sister [name].  NCM faxed referral to Amy , HHPT to start on Monday.  NCM notified Butch Penny with [facility] that patient is going to [facility] until Monday, she will alert the [facility] oxygen team, and he has a simply go set up.  PCP is  [facility] per sister , [name].  This information was given to Amy with [facility] agency.                        Action/Plan: DC home with [facility] services when ready.  Expected Discharge Date:  09/24/18               Expected Discharge Plan:  [place]  In-House Referral:     Discharge planning Services  CM Consult  Post Acute Care Choice:  Home Health Choice offered to:  Sibling  DME Arranged:  Oxygen DME Agency:  [place] Arranged:  PT [place] Agency:  [facility]  Status of Service:  Completed, signed off  If discussed at [place] of Stay Meetings,  dates discussed:    Additional Comments:  Zenon Mayo, RN 09/24/2018, 10:17 AM

## 2018-09-24 NOTE — Telephone Encounter (Signed)
Cld and lft the pt a vm to schedule a hospital follow up w/Dr. Benay Spice. Pt has been scheduled to see Dr. Benay Spice on 12/11 12pm. Letter mailed.

## 2018-09-24 NOTE — Progress Notes (Signed)
Oncology Nurse Navigator Documentation  Oncology Nurse Navigator Flowsheets 09/24/2018  Navigator Location [facility]  Referral date to RadOnc/MedOnc 09/24/2018  Navigator Encounter Type Other/per Dr. Benay Spice, he would like PET scan and to see patient on 10/09/18.  I updated scheduling to call patient with his appt.  I completed PET order and will call AP to get scheduled.    Treatment Phase Abnormal Scans  Barriers/Navigation Needs Coordination of Care  Interventions Coordination of Care  Coordination of Care Other  Acuity Level 2  Time Spent with Patient 30

## 2018-09-25 ENCOUNTER — Encounter: Payer: Self-pay | Admitting: *Deleted

## 2018-09-25 ENCOUNTER — Encounter (HOSPITAL_COMMUNITY): Payer: Medicare Other

## 2018-09-25 DIAGNOSIS — C3490 Malignant neoplasm of unspecified part of unspecified bronchus or lung: Secondary | ICD-10-CM

## 2018-09-25 SURGERY — BRONCHOSCOPY, WITH FLUOROSCOPY
Anesthesia: Moderate Sedation | Laterality: Bilateral

## 2018-09-25 NOTE — Progress Notes (Signed)
Oncology Nurse Navigator Documentation  Oncology Nurse Navigator Flowsheets 09/25/2018  Navigator Location [facility]  Navigator Encounter Type Other/I called to schedule PET scan X 3 times and central scheduling did not answer their phone.  Will call back later.   Treatment Phase Abnormal Scans  Barriers/Navigation Needs Coordination of Care  Interventions Coordination of Care  Coordination of Care Other  Acuity Level 1  Time Spent with Patient 15

## 2018-09-26 LAB — CULTURE, RESPIRATORY

## 2018-09-26 LAB — CULTURE, RESPIRATORY W GRAM STAIN

## 2018-09-27 ENCOUNTER — Encounter: Payer: Self-pay | Admitting: Nurse Practitioner

## 2018-09-27 ENCOUNTER — Ambulatory Visit (INDEPENDENT_AMBULATORY_CARE_PROVIDER_SITE_OTHER): Payer: Medicare Other | Admitting: Nurse Practitioner

## 2018-09-27 VITALS — BP 108/64 | HR 107 | Ht 64.0 in | Wt 127.8 lb

## 2018-09-27 DIAGNOSIS — B370 Candidal stomatitis: Secondary | ICD-10-CM | POA: Diagnosis not present

## 2018-09-27 DIAGNOSIS — J449 Chronic obstructive pulmonary disease, unspecified: Secondary | ICD-10-CM

## 2018-09-27 MED ORDER — NYSTATIN 100000 UNIT/ML MT SUSP: 5.0000 mL | Freq: Four times a day (QID) | OROMUCOSAL | 0 refills | Status: DC

## 2018-09-27 NOTE — Assessment & Plan Note (Signed)
Patient Instructions  Ronald [name] Will order nystatin suspension - rinse and spit after each meal and at bedtime  COPD Congratulations on quitting smoking Your procedure - outpatient bronchoscopy w/EBUS on December 4th, 2019@ 2:30 - please arrive early NPO after midnight the night before Hold goody powder and motrin day before procedure Continue O2 at

## 2018-09-27 NOTE — Progress Notes (Signed)
@Patient  ID: Ronald [name], male    DOB: Aug 05, 1956, 62 y.o.   MRN: [id number]  Chief Complaint  Patient presents with  . Hospitalization Follow-up    Referring provider: No ref. provider found  HPI 62 year old male former smoker (has quit since hospital discharge) with obstructive left lower lung mass, acute hypoxic respiratory failure, COPD followed by Dr. Valeta Harms. PMH includes alcoholism, HTN, TB.   Tests: CXR 09/21/18 - No change in the complete atelectasis involving the LEFT LOWER LOBE, likely post obstructive based on yesterday's CT. Small LEFT pleural effusion which is increasing in size. No new pulmonary parenchymal abnormalities.  CT chest 09/19/18 - The findings are concerning for a centrally obstructing mass/malignancy resulting in collapse of the left lower lobe. While a discrete mass cannot be measured, there is soft tissue fullness in the left infrahilar region. There is also abrupt cut off of the left lower lobe bronchus and narrowing of the left lower lobe pulmonary artery as it traverses this region. Recommend pulmonary consultation with consideration of bronchoscopy. Atherosclerotic change in the thoracic aorta. Small left-sided pleural effusion. 4 mm nodule in the right lower lobe, too small to characterize.  PFT: PFT Results Latest Ref Rng & Units 09/20/2018  FVC-Pre L 1.31  FVC-Predicted Pre % 35  Pre FEV1/FVC % % 56  FEV1-Pre L 0.74  FEV1-Predicted Pre % 26    OV 09/27/18 - preop visit/hospital follow up Patient presents for a preop visit. He is scheduled for an outpatient bronchoscopy with EBUS by Dr. Valeta Harms. He was recently discharged from the hospital diagnosed with obstructive left lower lung mass. He presented to the ED with weakness, 30 pound weight loss, productive cough. CT scan was completed upon admission and showed obstructing mass in left lower lobe. He states that he has been doing well since discharge. He is compliant with medications. He has quit  smoking. He has O2 at 3L [place] continuously. He has an appointment with his PCP scheduled. His only complaint today is that he is concerned that he has developed oral thrush. He denies chest pain, shortness of breath, fever, or edema.   No Known Allergies  Immunization History  Administered Date(s) Administered  . Pneumococcal Polysaccharide-23 09/22/2018    Past Medical History:  Diagnosis Date  . Alcoholism ([place])   . COPD (chronic obstructive pulmonary disease) ([place])   . Hypertension   . TB (pulmonary tuberculosis)     Tobacco History: Social History   Tobacco Use  Smoking Status Current Every Day Smoker  . Packs/day: 2.00  . Types: Cigarettes  Smokeless Tobacco Never Used   Ready to quit: Yes Counseling given: Yes   Outpatient Encounter Medications as of 09/27/2018  Medication Sig  . albuterol (PROVENTIL HFA;VENTOLIN HFA) 108 (90 Base) MCG/ACT inhaler Inhale 2 puffs into the lungs every 6 (six) hours as needed for wheezing or shortness of breath.  Marland Kitchen amoxicillin-clavulanate (AUGMENTIN) 875-125 MG tablet Take 1 tablet by mouth 2 (two) times daily for 7 days.  . Aspirin-Salicylamide-Caffeine (BC HEADACHE PO) Take 1 packet by mouth daily as needed (pain).  . Budesonide (RHINOCORT ALLERGY NA) Place 1 spray into the nose daily as needed (allergies).  . cetirizine (ZYRTEC) 10 MG tablet Take 10 mg by mouth daily as needed for allergies.  . feeding supplement, ENSURE ENLIVE, (ENSURE ENLIVE) LIQD Take 237 mLs by mouth 2 (two) times daily between meals.  . folic acid (FOLVITE) 1 MG tablet Take 1 tablet (1 mg total) by mouth daily.  Marland Kitchen  Guaifenesin (MUCINEX MAXIMUM STRENGTH) 1200 MG TB12 Take 1,200 mg by mouth daily.  Marland Kitchen guaiFENesin (MUCINEX) 600 MG 12 hr tablet Take 1 tablet (600 mg total) by mouth 2 (two) times daily.  Marland Kitchen ibuprofen (ADVIL,MOTRIN) 200 MG tablet Take 400 mg by mouth daily as needed for headache or moderate pain.  . Magnesium 500 MG CAPS Take 500 mg by mouth daily.  .  magnesium gluconate (MAGONATE) 30 MG tablet Take 1 tablet (30 mg total) by mouth daily.  . nicotine (NICODERM CQ - DOSED IN MG/24 HOURS) 21 mg/24hr patch Place 21 mg onto the skin daily.  Marland Kitchen senna-docusate (SENOKOT-S) 8.6-50 MG tablet Take 1 tablet by mouth at bedtime.  . thiamine 100 MG tablet Take 1 tablet (100 mg total) by mouth daily.  Marland Kitchen nystatin (MYCOSTATIN) 100000 UNIT/ML suspension Take 5 mLs (500,000 Units total) by mouth 4 (four) times daily.   No facility-administered encounter medications on file as of 09/27/2018.      Review of Systems  Review of Systems  Constitutional: Negative.  Negative for chills and fever.  HENT:       Soreness and redness to tongue with throat irritation.   Respiratory: Negative for cough and shortness of breath.   Cardiovascular: Negative.  Negative for chest pain, palpitations and leg swelling.  Gastrointestinal: Negative.   Allergic/Immunologic: Negative.   Neurological: Negative.   Psychiatric/Behavioral: Negative.        Physical Exam  BP 108/64 (BP Location: Right Arm, Patient Position: Sitting, Cuff Size: Normal)   Pulse (!) 107   Ht 5\' 4"  (1.626 m)   Wt 127 lb 12.8 oz (58 kg)   SpO2 100% Comment: on 3L of O2  BMI 21.94 kg/m   Wt Readings from Last 5 Encounters:  09/27/18 127 lb 12.8 oz (58 kg)  09/20/18 120 lb 13 oz (54.8 kg)  09/30/14 155 lb (70.3 kg)     Physical Exam  Constitutional: He is oriented to person, place, and time. He appears well-developed and well-nourished. No distress.  HENT:  Oral thrush noted  Cardiovascular: Normal rate and regular rhythm.  Pulmonary/Chest: Effort normal and breath sounds normal. No respiratory distress. He has no wheezes. He has no rales.  Musculoskeletal: He exhibits no edema.  Neurological: He is alert and oriented to person, place, and time.  Skin: Skin is warm and dry.  Psychiatric: He has a normal mood and affect.  Nursing note and vitals reviewed.     Imaging: Dg Chest 2  View  Result Date: 09/19/2018 CLINICAL DATA:  couple of weeks when he stands up his "feet give out on me and I fall ." Patient states his home nurse reported orthostatic hypotension. EXAM: CHEST - 2 VIEW COMPARISON:  09/30/2014 FINDINGS: There is LEFT LOWER lobe collapse. Heart size is normal. No pulmonary edema. RIGHT lung is clear. There is atherosclerotic calcification of the thoracic aorta. IMPRESSION: 1. LEFT LOWER lobe collapse. 2. Consider central obstruction and infection. 3. Followup PA and lateral chest X-ray is recommended in 3-4 weeks following treatment to ensure resolution and exclude underlying malignancy. Electronically Signed   By: Nolon Nations M.D.   On: 09/19/2018 17:24   Ct Head Wo Contrast  Result Date: 09/20/2018 CLINICAL DATA:  Small cell lung cancer. EXAM: CT HEAD WITHOUT CONTRAST TECHNIQUE: Contiguous axial images were obtained from the base of the skull through the vertex without intravenous contrast. COMPARISON:  None. FINDINGS: Brain: There is atrophy and chronic small vessel disease changes. No acute intracranial abnormality.  Specifically, no hemorrhage, hydrocephalus, mass lesion, acute infarction, or significant intracranial injury. Vascular: No hyperdense vessel or unexpected calcification. Skull: No acute calvarial abnormality. Sinuses/Orbits: Visualized paranasal sinuses and mastoids clear. Orbital soft tissues unremarkable. Other: None IMPRESSION: No acute intracranial abnormality. Atrophy, chronic microvascular disease. Electronically Signed   By: Rolm Baptise M.D.   On: 09/20/2018 08:56   Ct Chest W Contrast  Result Date: 09/19/2018 CLINICAL DATA:  Left lower lobe collapse seen on x-ray. Acute respiratory illness. EXAM: CT CHEST WITH CONTRAST TECHNIQUE: Multidetector CT imaging of the chest was performed during intravenous contrast administration. CONTRAST:  28mL OMNIPAQUE IOHEXOL 300 MG/ML  SOLN COMPARISON:  Chest x-ray September 19, 2018 FINDINGS:  Cardiovascular: There is atherosclerotic change in the thoracic aorta without dissection. No aneurysm. The heart size is normal. No obvious coronary artery calcifications. The main pulmonary artery is unremarkable. The left lower lobe central pulmonary artery is narrowed over a short distance in the infrahilar region before returning to more normal size. This is well seen on coronal image 80. Mediastinum/Nodes: The thyroid and esophagus are unremarkable. There is a left-sided pleural effusion. No pericardial effusion or right pleural effusion. Soft tissue fullness in the inferior left hilum suspicious for a centrally obstructing mass. No other adenopathy identified. Lungs/Pleura: There is mucus in the distal trachea and mainstem bronchi. The trachea, mainstem bronchi, right-sided airways, and left upper lobe airways are normal. There is abrupt cut off of the left lower lobe bronchus with complete collapse of the left lower lobe. While it is difficult to discern a discrete mass, I am suspicious the abrupt cut off of the left lower lobe bronchus and left lower lobe collapse may be due to a centrally obstructing mass with soft tissue fullness in the infrahilar region. There is a small left-sided pleural effusion. No right-sided pleural effusion. No pneumothorax. There is a nodule in the right lower lobe on series 4, image 115 measuring 4 mm. No other pulmonary nodules, masses, or infiltrates. Upper Abdomen: No acute abnormality. Musculoskeletal: No chest wall abnormality. No acute or significant osseous findings. IMPRESSION: 1. The findings are concerning for a centrally obstructing mass/malignancy resulting in collapse of the left lower lobe. While a discrete mass cannot be measured, there is soft tissue fullness in the left infrahilar region. There is also abrupt cut off of the left lower lobe bronchus and narrowing of the left lower lobe pulmonary artery as it traverses this region. Recommend pulmonary consultation  with consideration of bronchoscopy. 2. Atherosclerotic change in the thoracic aorta. 3. Small left-sided pleural effusion. 4. 4 mm nodule in the right lower lobe, too small to characterize. Aortic Atherosclerosis (ICD10-I70.0). Electronically Signed   By: Dorise Bullion III M.D   On: 09/19/2018 20:57   Dg Chest Port 1 View  Result Date: 09/21/2018 CLINICAL DATA:  Follow-up obstructive LEFT LOWER LOBE atelectasis and/or pneumonia. EXAM: PORTABLE CHEST 1 VIEW COMPARISON:  CT chest yesterday. Chest x-rays yesterday and 09/30/2014. FINDINGS: No interval change in the complete atelectasis involving the LEFT LOWER LOBE. A small to moderate-sized LEFT pleural effusion which is likely increasing in size. No new pulmonary parenchymal abnormalities elsewhere. Cardiac silhouette normal in size, unchanged. Thoracic aorta atherosclerotic. Hilar and mediastinal contours otherwise unremarkable. IMPRESSION: 1. No change in the complete atelectasis involving the LEFT LOWER LOBE, likely post obstructive based on yesterday's CT. 2. Small LEFT pleural effusion which is increasing in size. 3. No new pulmonary parenchymal abnormalities. Electronically Signed   By: Sherran Needs.D.  On: 09/21/2018 11:26   Dg Abd Portable 1v  Result Date: 09/21/2018 CLINICAL DATA:  62 year old male with history of abdominal cramping. EXAM: PORTABLE ABDOMEN - 1 VIEW COMPARISON:  No priors. FINDINGS: Gas and stool is noted throughout the colon. There is a gas present throughout the small bowel, without evidence of small bowel dilatation or air-fluid levels. No definite pneumoperitoneum noted on this single supine image. IMPRESSION: 1. Nonspecific, nonobstructive bowel gas pattern. 2. No pneumoperitoneum. Electronically Signed   By: Vinnie Langton M.D.   On: 09/21/2018 16:41     Assessment & Plan:   COPD (chronic obstructive pulmonary disease) ([place]) Patient is stable - doing well Walked in office today sats remained at 97% on 2  L  Patient Instructions  Ritta Slot Will order nystatin suspension - rinse and spit after each meal and at bedtime  COPD Congratulations on quitting smoking Your procedure - outpatient bronchoscopy w/EBUS on December 4th, 2019@ 2:30 - please arrive early NPO after midnight the night before Hold goody powder and motrin day before procedure Continue O2 at      [place], oral Patient Instructions  Thrush Will order nystatin suspension - rinse and spit after each meal and at bedtime  COPD Congratulations on quitting smoking Your procedure - outpatient bronchoscopy w/EBUS on December 4th, 2019@ 2:30 - please arrive early NPO after midnight the night before Hold goody powder and motrin day before procedure Continue O2 at         Fenton Foy, NP 09/27/2018

## 2018-09-27 NOTE — Patient Instructions (Addendum)
Thrush Will order nystatin suspension - rinse and spit after each meal and at bedtime  COPD Congratulations on quitting smoking Your procedure - outpatient bronchoscopy w/EBUS on December 4th, 2019@ 2:30 - please arrive early NPO after midnight the night before Hold goody powder and motrin day before procedure Continue O2 at 2L O2 [place]

## 2018-09-27 NOTE — H&P (View-Only) (Signed)
@Patient  ID: Ronald [name], male    DOB: 1956/05/01, 62 y.o.   MRN: [id number]  Chief Complaint  Patient presents with  . Hospitalization Follow-up    Referring provider: No ref. provider found  HPI 62 year old male former smoker (has quit since hospital discharge) with obstructive left lower lung mass, acute hypoxic respiratory failure, COPD followed by Dr. Valeta Harms. PMH includes alcoholism, HTN, TB.   Tests: CXR 09/21/18 - No change in the complete atelectasis involving the LEFT LOWER LOBE, likely post obstructive based on yesterday's CT. Small LEFT pleural effusion which is increasing in size. No new pulmonary parenchymal abnormalities.  CT chest 09/19/18 - The findings are concerning for a centrally obstructing mass/malignancy resulting in collapse of the left lower lobe. While a discrete mass cannot be measured, there is soft tissue fullness in the left infrahilar region. There is also abrupt cut off of the left lower lobe bronchus and narrowing of the left lower lobe pulmonary artery as it traverses this region. Recommend pulmonary consultation with consideration of bronchoscopy. Atherosclerotic change in the thoracic aorta. Small left-sided pleural effusion. 4 mm nodule in the right lower lobe, too small to characterize.  PFT: PFT Results Latest Ref Rng & Units 09/20/2018  FVC-Pre L 1.31  FVC-Predicted Pre % 35  Pre FEV1/FVC % % 56  FEV1-Pre L 0.74  FEV1-Predicted Pre % 26    OV 09/27/18 - preop visit/hospital follow up Patient presents for a preop visit. He is scheduled for an outpatient bronchoscopy with EBUS by Dr. Valeta Harms. He was recently discharged from the hospital diagnosed with obstructive left lower lung mass. He presented to the ED with weakness, 30 pound weight loss, productive cough. CT scan was completed upon admission and showed obstructing mass in left lower lobe. He states that he has been doing well since discharge. He is compliant with medications. He has quit  smoking. He has O2 at 3L [place] continuously. He has an appointment with his PCP scheduled. His only complaint today is that he is concerned that he has developed oral thrush. He denies chest pain, shortness of breath, fever, or edema.   No Known Allergies  Immunization History  Administered Date(s) Administered  . Pneumococcal Polysaccharide-23 09/22/2018    Past Medical History:  Diagnosis Date  . Alcoholism ([place])   . COPD (chronic obstructive pulmonary disease) ([place])   . Hypertension   . TB (pulmonary tuberculosis)     Tobacco History: Social History   Tobacco Use  Smoking Status Current Every Day Smoker  . Packs/day: 2.00  . Types: Cigarettes  Smokeless Tobacco Never Used   Ready to quit: Yes Counseling given: Yes   Outpatient Encounter Medications as of 09/27/2018  Medication Sig  . albuterol (PROVENTIL HFA;VENTOLIN HFA) 108 (90 Base) MCG/ACT inhaler Inhale 2 puffs into the lungs every 6 (six) hours as needed for wheezing or shortness of breath.  Marland Kitchen amoxicillin-clavulanate (AUGMENTIN) 875-125 MG tablet Take 1 tablet by mouth 2 (two) times daily for 7 days.  . Aspirin-Salicylamide-Caffeine (BC HEADACHE PO) Take 1 packet by mouth daily as needed (pain).  . Budesonide (RHINOCORT ALLERGY NA) Place 1 spray into the nose daily as needed (allergies).  . cetirizine (ZYRTEC) 10 MG tablet Take 10 mg by mouth daily as needed for allergies.  . feeding supplement, ENSURE ENLIVE, (ENSURE ENLIVE) LIQD Take 237 mLs by mouth 2 (two) times daily between meals.  . folic acid (FOLVITE) 1 MG tablet Take 1 tablet (1 mg total) by mouth daily.  Marland Kitchen  Guaifenesin (MUCINEX MAXIMUM STRENGTH) 1200 MG TB12 Take 1,200 mg by mouth daily.  Marland Kitchen guaiFENesin (MUCINEX) 600 MG 12 hr tablet Take 1 tablet (600 mg total) by mouth 2 (two) times daily.  Marland Kitchen ibuprofen (ADVIL,MOTRIN) 200 MG tablet Take 400 mg by mouth daily as needed for headache or moderate pain.  . Magnesium 500 MG CAPS Take 500 mg by mouth daily.  .  magnesium gluconate (MAGONATE) 30 MG tablet Take 1 tablet (30 mg total) by mouth daily.  . nicotine (NICODERM CQ - DOSED IN MG/24 HOURS) 21 mg/24hr patch Place 21 mg onto the skin daily.  Marland Kitchen senna-docusate (SENOKOT-S) 8.6-50 MG tablet Take 1 tablet by mouth at bedtime.  . thiamine 100 MG tablet Take 1 tablet (100 mg total) by mouth daily.  Marland Kitchen nystatin (MYCOSTATIN) 100000 UNIT/ML suspension Take 5 mLs (500,000 Units total) by mouth 4 (four) times daily.   No facility-administered encounter medications on file as of 09/27/2018.      Review of Systems  Review of Systems  Constitutional: Negative.  Negative for chills and fever.  HENT:       Soreness and redness to tongue with throat irritation.   Respiratory: Negative for cough and shortness of breath.   Cardiovascular: Negative.  Negative for chest pain, palpitations and leg swelling.  Gastrointestinal: Negative.   Allergic/Immunologic: Negative.   Neurological: Negative.   Psychiatric/Behavioral: Negative.        Physical Exam  BP 108/64 (BP Location: Right Arm, Patient Position: Sitting, Cuff Size: Normal)   Pulse (!) 107   Ht 5\' 4"  (1.626 m)   Wt 127 lb 12.8 oz (58 kg)   SpO2 100% Comment: on 3L of O2  BMI 21.94 kg/m   Wt Readings from Last 5 Encounters:  09/27/18 127 lb 12.8 oz (58 kg)  09/20/18 120 lb 13 oz (54.8 kg)  09/30/14 155 lb (70.3 kg)     Physical Exam  Constitutional: He is oriented to person, place, and time. He appears well-developed and well-nourished. No distress.  HENT:  Oral thrush noted  Cardiovascular: Normal rate and regular rhythm.  Pulmonary/Chest: Effort normal and breath sounds normal. No respiratory distress. He has no wheezes. He has no rales.  Musculoskeletal: He exhibits no edema.  Neurological: He is alert and oriented to person, place, and time.  Skin: Skin is warm and dry.  Psychiatric: He has a normal mood and affect.  Nursing note and vitals reviewed.     Imaging: Dg Chest 2  View  Result Date: 09/19/2018 CLINICAL DATA:  couple of weeks when he stands up his "feet give out on me and I fall ." Patient states his home nurse reported orthostatic hypotension. EXAM: CHEST - 2 VIEW COMPARISON:  09/30/2014 FINDINGS: There is LEFT LOWER lobe collapse. Heart size is normal. No pulmonary edema. RIGHT lung is clear. There is atherosclerotic calcification of the thoracic aorta. IMPRESSION: 1. LEFT LOWER lobe collapse. 2. Consider central obstruction and infection. 3. Followup PA and lateral chest X-ray is recommended in 3-4 weeks following treatment to ensure resolution and exclude underlying malignancy. Electronically Signed   By: Nolon Nations M.D.   On: 09/19/2018 17:24   Ct Head Wo Contrast  Result Date: 09/20/2018 CLINICAL DATA:  Small cell lung cancer. EXAM: CT HEAD WITHOUT CONTRAST TECHNIQUE: Contiguous axial images were obtained from the base of the skull through the vertex without intravenous contrast. COMPARISON:  None. FINDINGS: Brain: There is atrophy and chronic small vessel disease changes. No acute intracranial abnormality.  Specifically, no hemorrhage, hydrocephalus, mass lesion, acute infarction, or significant intracranial injury. Vascular: No hyperdense vessel or unexpected calcification. Skull: No acute calvarial abnormality. Sinuses/Orbits: Visualized paranasal sinuses and mastoids clear. Orbital soft tissues unremarkable. Other: None IMPRESSION: No acute intracranial abnormality. Atrophy, chronic microvascular disease. Electronically Signed   By: Rolm Baptise M.D.   On: 09/20/2018 08:56   Ct Chest W Contrast  Result Date: 09/19/2018 CLINICAL DATA:  Left lower lobe collapse seen on x-ray. Acute respiratory illness. EXAM: CT CHEST WITH CONTRAST TECHNIQUE: Multidetector CT imaging of the chest was performed during intravenous contrast administration. CONTRAST:  31mL OMNIPAQUE IOHEXOL 300 MG/ML  SOLN COMPARISON:  Chest x-ray September 19, 2018 FINDINGS:  Cardiovascular: There is atherosclerotic change in the thoracic aorta without dissection. No aneurysm. The heart size is normal. No obvious coronary artery calcifications. The main pulmonary artery is unremarkable. The left lower lobe central pulmonary artery is narrowed over a short distance in the infrahilar region before returning to more normal size. This is well seen on coronal image 80. Mediastinum/Nodes: The thyroid and esophagus are unremarkable. There is a left-sided pleural effusion. No pericardial effusion or right pleural effusion. Soft tissue fullness in the inferior left hilum suspicious for a centrally obstructing mass. No other adenopathy identified. Lungs/Pleura: There is mucus in the distal trachea and mainstem bronchi. The trachea, mainstem bronchi, right-sided airways, and left upper lobe airways are normal. There is abrupt cut off of the left lower lobe bronchus with complete collapse of the left lower lobe. While it is difficult to discern a discrete mass, I am suspicious the abrupt cut off of the left lower lobe bronchus and left lower lobe collapse may be due to a centrally obstructing mass with soft tissue fullness in the infrahilar region. There is a small left-sided pleural effusion. No right-sided pleural effusion. No pneumothorax. There is a nodule in the right lower lobe on series 4, image 115 measuring 4 mm. No other pulmonary nodules, masses, or infiltrates. Upper Abdomen: No acute abnormality. Musculoskeletal: No chest wall abnormality. No acute or significant osseous findings. IMPRESSION: 1. The findings are concerning for a centrally obstructing mass/malignancy resulting in collapse of the left lower lobe. While a discrete mass cannot be measured, there is soft tissue fullness in the left infrahilar region. There is also abrupt cut off of the left lower lobe bronchus and narrowing of the left lower lobe pulmonary artery as it traverses this region. Recommend pulmonary consultation  with consideration of bronchoscopy. 2. Atherosclerotic change in the thoracic aorta. 3. Small left-sided pleural effusion. 4. 4 mm nodule in the right lower lobe, too small to characterize. Aortic Atherosclerosis (ICD10-I70.0). Electronically Signed   By: Dorise Bullion III M.D   On: 09/19/2018 20:57   Dg Chest Port 1 View  Result Date: 09/21/2018 CLINICAL DATA:  Follow-up obstructive LEFT LOWER LOBE atelectasis and/or pneumonia. EXAM: PORTABLE CHEST 1 VIEW COMPARISON:  CT chest yesterday. Chest x-rays yesterday and 09/30/2014. FINDINGS: No interval change in the complete atelectasis involving the LEFT LOWER LOBE. A small to moderate-sized LEFT pleural effusion which is likely increasing in size. No new pulmonary parenchymal abnormalities elsewhere. Cardiac silhouette normal in size, unchanged. Thoracic aorta atherosclerotic. Hilar and mediastinal contours otherwise unremarkable. IMPRESSION: 1. No change in the complete atelectasis involving the LEFT LOWER LOBE, likely post obstructive based on yesterday's CT. 2. Small LEFT pleural effusion which is increasing in size. 3. No new pulmonary parenchymal abnormalities. Electronically Signed   By: Sherran Needs.D.  On: 09/21/2018 11:26   Dg Abd Portable 1v  Result Date: 09/21/2018 CLINICAL DATA:  62 year old male with history of abdominal cramping. EXAM: PORTABLE ABDOMEN - 1 VIEW COMPARISON:  No priors. FINDINGS: Gas and stool is noted throughout the colon. There is a gas present throughout the small bowel, without evidence of small bowel dilatation or air-fluid levels. No definite pneumoperitoneum noted on this single supine image. IMPRESSION: 1. Nonspecific, nonobstructive bowel gas pattern. 2. No pneumoperitoneum. Electronically Signed   By: Vinnie Langton M.D.   On: 09/21/2018 16:41     Assessment & Plan:   COPD (chronic obstructive pulmonary disease) ([place]) Patient is stable - doing well Walked in office today sats remained at 97% on 2  L  Patient Instructions  Ritta Slot Will order nystatin suspension - rinse and spit after each meal and at bedtime  COPD Congratulations on quitting smoking Your procedure - outpatient bronchoscopy w/EBUS on December 4th, 2019@ 2:30 - please arrive early NPO after midnight the night before Hold goody powder and motrin day before procedure Continue O2 at      [place], oral Patient Instructions  Thrush Will order nystatin suspension - rinse and spit after each meal and at bedtime  COPD Congratulations on quitting smoking Your procedure - outpatient bronchoscopy w/EBUS on December 4th, 2019@ 2:30 - please arrive early NPO after midnight the night before Hold goody powder and motrin day before procedure Continue O2 at         Fenton Foy, NP 09/27/2018

## 2018-09-27 NOTE — Assessment & Plan Note (Addendum)
Patient is stable - doing well Walked in office today sats remained at 97% on 2 L  Patient Instructions  Ritta Slot Will order nystatin suspension - rinse and spit after each meal and at bedtime  COPD Congratulations on quitting smoking Your procedure - outpatient bronchoscopy w/EBUS on December 4th, 2019@ 2:30 - please arrive early NPO after midnight the night before Hold goody powder and motrin day before procedure Continue O2 at

## 2018-09-29 NOTE — Progress Notes (Signed)
PCCM: Agree. Thanks for seeing him. Garner Nash, DO [facility] 09/29/2018 9:41 PM

## 2018-10-01 ENCOUNTER — Encounter (HOSPITAL_COMMUNITY): Payer: Self-pay | Admitting: *Deleted

## 2018-10-01 ENCOUNTER — Other Ambulatory Visit: Payer: Self-pay

## 2018-10-01 NOTE — Progress Notes (Signed)
Pt denies any acute pulmonary issues. Pt denies chest pain and being under the care of a cardiologist. Pt denies having a stress test and cardiac cath. Pt requested that sister Ronald [name] complete pre-op assessment. Ronald [name] made aware to have pt stop taking vitamins, fish oil and herbal medications. Do not take any NSAIDs ie: Ibuprofen, Advil, Naproxen (Aleve), Motrin, BC and Goody Powder. Ronald [name] verbalized understanding of all pre-op instructions.

## 2018-10-02 ENCOUNTER — Ambulatory Visit (HOSPITAL_COMMUNITY)
Admission: RE | Admit: 2018-10-02 | Discharge: 2018-10-02 | Disposition: A | Discharge status: Home / Self Care | Payer: Medicare Other | Source: Ambulatory Visit | Attending: Pulmonary Disease | Admitting: Pulmonary Disease

## 2018-10-02 ENCOUNTER — Ambulatory Visit (HOSPITAL_COMMUNITY): Payer: Medicare Other | Admitting: Certified Registered"

## 2018-10-02 ENCOUNTER — Encounter (HOSPITAL_COMMUNITY): Payer: Self-pay

## 2018-10-02 ENCOUNTER — Encounter (HOSPITAL_COMMUNITY)
Admission: RE | Disposition: A | Discharge status: Home / Self Care | Payer: Self-pay | Source: Ambulatory Visit | Attending: Pulmonary Disease

## 2018-10-02 DIAGNOSIS — J9819 Other pulmonary collapse: Secondary | ICD-10-CM | POA: Diagnosis not present

## 2018-10-02 DIAGNOSIS — Z9981 Dependence on supplemental oxygen: Secondary | ICD-10-CM | POA: Diagnosis not present

## 2018-10-02 DIAGNOSIS — J449 Chronic obstructive pulmonary disease, unspecified: Secondary | ICD-10-CM | POA: Diagnosis not present

## 2018-10-02 DIAGNOSIS — J90 Pleural effusion, not elsewhere classified: Secondary | ICD-10-CM | POA: Insufficient documentation

## 2018-10-02 DIAGNOSIS — Z87891 Personal history of nicotine dependence: Secondary | ICD-10-CM | POA: Diagnosis not present

## 2018-10-02 DIAGNOSIS — I10 Essential (primary) hypertension: Secondary | ICD-10-CM | POA: Diagnosis not present

## 2018-10-02 DIAGNOSIS — C3432 Malignant neoplasm of lower lobe, left bronchus or lung: Secondary | ICD-10-CM | POA: Diagnosis not present

## 2018-10-02 DIAGNOSIS — J189 Pneumonia, unspecified organism: Secondary | ICD-10-CM | POA: Diagnosis present

## 2018-10-02 DIAGNOSIS — R918 Other nonspecific abnormal finding of lung field: Secondary | ICD-10-CM | POA: Diagnosis present

## 2018-10-02 DIAGNOSIS — Z7982 Long term (current) use of aspirin: Secondary | ICD-10-CM | POA: Insufficient documentation

## 2018-10-02 DIAGNOSIS — F1020 Alcohol dependence, uncomplicated: Secondary | ICD-10-CM | POA: Insufficient documentation

## 2018-10-02 DIAGNOSIS — R590 Localized enlarged lymph nodes: Secondary | ICD-10-CM | POA: Diagnosis not present

## 2018-10-02 HISTORY — PX: VIDEO BRONCHOSCOPY WITH ENDOBRONCHIAL ULTRASOUND: SHX6177

## 2018-10-02 HISTORY — DX: Localized enlarged lymph nodes: R59.0

## 2018-10-02 HISTORY — DX: Anemia, unspecified: D64.9

## 2018-10-02 HISTORY — DX: Atelectasis: J98.11

## 2018-10-02 HISTORY — DX: Family history of other specified conditions: Z84.89

## 2018-10-02 HISTORY — DX: Pneumonia, unspecified organism: J18.9

## 2018-10-02 HISTORY — DX: Presence of dental prosthetic device (complete) (partial): Z97.2

## 2018-10-02 LAB — CBC
HEMATOCRIT: 34.1 % — AB (ref 39.0–52.0)
HEMOGLOBIN: 12.3 g/dL — AB (ref 13.0–17.0)
MCH: 37.3 pg — ABNORMAL HIGH (ref 26.0–34.0)
MCHC: 36.1 g/dL — ABNORMAL HIGH (ref 30.0–36.0)
MCV: 103.3 fL — ABNORMAL HIGH (ref 80.0–100.0)
Platelets: 306 10*3/uL (ref 150–400)
RBC: 3.3 MIL/uL — ABNORMAL LOW (ref 4.22–5.81)
RDW: 14.6 % (ref 11.5–15.5)
WBC: 8.9 10*3/uL (ref 4.0–10.5)
nRBC: 0 % (ref 0.0–0.2)

## 2018-10-02 LAB — COMPREHENSIVE METABOLIC PANEL
ALBUMIN: 2.3 g/dL — AB (ref 3.5–5.0)
ALK PHOS: 83 U/L (ref 38–126)
ALT: 21 U/L (ref 0–44)
AST: 39 U/L (ref 15–41)
Anion gap: 10 (ref 5–15)
BUN: 6 mg/dL — AB (ref 8–23)
CALCIUM: 9 mg/dL (ref 8.9–10.3)
CO2: 26 mmol/L (ref 22–32)
Chloride: 100 mmol/L (ref 98–111)
Creatinine, Ser: 0.78 mg/dL (ref 0.61–1.24)
GFR calc Af Amer: >60 mL/min (ref >60)
GFR calc non Af Amer: >60 mL/min (ref >60)
GLUCOSE: 101 mg/dL — AB (ref 70–99)
POTASSIUM: 3.8 mmol/L (ref 3.5–5.1)
SODIUM: 136 mmol/L (ref 135–145)
Total Bilirubin: 0.4 mg/dL (ref 0.3–1.2)
Total Protein: 7.3 g/dL (ref 6.5–8.1)

## 2018-10-02 LAB — PROTIME-INR
INR: 1.08
Prothrombin Time: 13.9 seconds (ref 11.4–15.2)

## 2018-10-02 LAB — APTT: aPTT: 32 seconds (ref 24–36)

## 2018-10-02 SURGERY — BRONCHOSCOPY, WITH EBUS
Anesthesia: General

## 2018-10-02 MED ORDER — LIDOCAINE 2% (20 MG/ML) 5 ML SYRINGE
INTRAMUSCULAR | Status: AC
  Filled 2018-10-02: qty 15

## 2018-10-02 MED ORDER — GLYCOPYRROLATE PF 0.2 MG/ML IJ SOSY
PREFILLED_SYRINGE | INTRAMUSCULAR | Status: AC
  Filled 2018-10-02: qty 1

## 2018-10-02 MED ORDER — LACTATED RINGERS IV SOLN
INTRAVENOUS | Status: DC
  Administered 2018-10-02: 11:00:00 via INTRAVENOUS

## 2018-10-02 MED ORDER — MEPERIDINE HCL 50 MG/ML IJ SOLN: 6.2500 mg | INTRAMUSCULAR | Status: DC | PRN

## 2018-10-02 MED ORDER — SUGAMMADEX SODIUM 200 MG/2ML IV SOLN
INTRAVENOUS | Status: DC | PRN
  Administered 2018-10-02: 150 mg via INTRAVENOUS

## 2018-10-02 MED ORDER — ONDANSETRON HCL 4 MG/2ML IJ SOLN
INTRAMUSCULAR | Status: DC | PRN
  Administered 2018-10-02: 4 mg via INTRAVENOUS

## 2018-10-02 MED ORDER — PROPOFOL 10 MG/ML IV BOLUS
INTRAVENOUS | Status: DC | PRN
  Administered 2018-10-02: 120 mg via INTRAVENOUS

## 2018-10-02 MED ORDER — EPHEDRINE 5 MG/ML INJ
INTRAVENOUS | Status: AC
  Filled 2018-10-02: qty 20

## 2018-10-02 MED ORDER — ROCURONIUM BROMIDE 10 MG/ML (PF) SYRINGE
PREFILLED_SYRINGE | INTRAVENOUS | Status: DC | PRN
  Administered 2018-10-02: 15 mg via INTRAVENOUS

## 2018-10-02 MED ORDER — DEXAMETHASONE SODIUM PHOSPHATE 10 MG/ML IJ SOLN
INTRAMUSCULAR | Status: DC | PRN
  Administered 2018-10-02: 10 mg via INTRAVENOUS

## 2018-10-02 MED ORDER — FENTANYL CITRATE (PF) 100 MCG/2ML IJ SOLN
INTRAMUSCULAR | Status: DC | PRN
  Administered 2018-10-02: 125 ug via INTRAVENOUS

## 2018-10-02 MED ORDER — 0.9 % SODIUM CHLORIDE (POUR BTL) OPTIME
TOPICAL | Status: DC | PRN
  Administered 2018-10-02: 1000 mL

## 2018-10-02 MED ORDER — LACTATED RINGERS IV SOLN: INTRAVENOUS | Status: DC

## 2018-10-02 MED ORDER — LIDOCAINE 2% (20 MG/ML) 5 ML SYRINGE
INTRAMUSCULAR | Status: DC | PRN
  Administered 2018-10-02: 50 mg via INTRAVENOUS

## 2018-10-02 MED ORDER — FENTANYL CITRATE (PF) 100 MCG/2ML IJ SOLN: 25.0000 ug | INTRAMUSCULAR | Status: DC | PRN

## 2018-10-02 MED ORDER — SUCCINYLCHOLINE CHLORIDE 20 MG/ML IJ SOLN
INTRAMUSCULAR | Status: DC | PRN
  Administered 2018-10-02: 100 mg via INTRAVENOUS

## 2018-10-02 MED ORDER — FENTANYL CITRATE (PF) 250 MCG/5ML IJ SOLN
INTRAMUSCULAR | Status: AC
  Filled 2018-10-02: qty 5

## 2018-10-02 MED ORDER — ROCURONIUM BROMIDE 50 MG/5ML IV SOSY
PREFILLED_SYRINGE | INTRAVENOUS | Status: AC
  Filled 2018-10-02: qty 15

## 2018-10-02 MED ORDER — SODIUM CHLORIDE 0.9 % IV SOLN
INTRAVENOUS | Status: DC | PRN
  Administered 2018-10-02: 50 ug/min via INTRAVENOUS

## 2018-10-02 MED ORDER — SUCCINYLCHOLINE CHLORIDE 200 MG/10ML IV SOSY
PREFILLED_SYRINGE | INTRAVENOUS | Status: AC
  Filled 2018-10-02: qty 30

## 2018-10-02 MED ORDER — PROMETHAZINE HCL 25 MG/ML IJ SOLN: 6.2500 mg | INTRAMUSCULAR | Status: DC | PRN

## 2018-10-02 SURGICAL SUPPLY — 32 items
ADAPTER VALVE BIOPSY EBUS (MISCELLANEOUS) ×2 IMPLANT
ADPTR VALVE BIOPSY EBUS (MISCELLANEOUS) ×2
BRUSH CYTOL CELLEBRITY 1.5X140 (MISCELLANEOUS) ×2 IMPLANT
CANISTER SUCT 3000ML PPV (MISCELLANEOUS) ×2 IMPLANT
CONT SPEC 4OZ CLIKSEAL STRL BL (MISCELLANEOUS) ×2 IMPLANT
COVER BACK TABLE 60X90IN (DRAPES) ×2 IMPLANT
FILTER STRAW FLUID ASPIR (MISCELLANEOUS) IMPLANT
FORCEPS BIOP RJ4 1.8 (CUTTING FORCEPS) ×2 IMPLANT
GAUZE SPONGE 4X4 12PLY STRL (GAUZE/BANDAGES/DRESSINGS) ×2 IMPLANT
GLOVE SURG SS PI 7.5 STRL IVOR (GLOVE) ×2 IMPLANT
GOWN STRL REUS W/ TWL LRG LVL3 (GOWN DISPOSABLE) ×2 IMPLANT
GOWN STRL REUS W/TWL LRG LVL3 (GOWN DISPOSABLE) ×2
KIT CLEAN ENDO COMPLIANCE (KITS) ×4 IMPLANT
KIT TURNOVER KIT B (KITS) ×2 IMPLANT
MARKER SKIN DUAL TIP RULER LAB (MISCELLANEOUS) ×2 IMPLANT
NEEDLE ASPIRATION VIZISHOT 19G (NEEDLE) ×2 IMPLANT
NEEDLE ASPIRATION VIZISHOT 21G (NEEDLE) IMPLANT
NS IRRIG 1000ML POUR BTL (IV SOLUTION) ×2 IMPLANT
OIL SILICONE PENTAX (PARTS (SERVICE/REPAIRS)) ×2 IMPLANT
PAD ARMBOARD 7.5X6 YLW CONV (MISCELLANEOUS) ×4 IMPLANT
STOPCOCK 4 WAY LG BORE MALE ST (IV SETS) ×2 IMPLANT
SYR 20CC LL (SYRINGE) ×2 IMPLANT
SYR 20ML ECCENTRIC (SYRINGE) ×8 IMPLANT
SYR 3ML LL SCALE MARK (SYRINGE) IMPLANT
SYR 50ML SLIP (SYRINGE) ×2 IMPLANT
SYR 5ML LL (SYRINGE) ×2 IMPLANT
TRAP SPECIMEN MUCOUS 40CC (MISCELLANEOUS) ×4 IMPLANT
TUBE CONNECTING 20X1/4 (TUBING) ×2 IMPLANT
VALVE BIOPSY  SINGLE USE (MISCELLANEOUS) ×1
VALVE BIOPSY SINGLE USE (MISCELLANEOUS) ×1 IMPLANT
VALVE SUCTION BRONCHIO DISP (MISCELLANEOUS) ×2 IMPLANT
WATER STERILE IRR 1000ML POUR (IV SOLUTION) ×2 IMPLANT

## 2018-10-02 NOTE — Anesthesia Procedure Notes (Signed)
Procedure Name: Intubation Date/Time: 10/02/2018 1:05 PM Performed by: Cleda Daub, CRNA Pre-anesthesia Checklist: Patient identified, Emergency Drugs available, Suction available and Patient being monitored Patient Re-evaluated:Patient Re-evaluated prior to induction Oxygen Delivery Method: Circle system utilized Preoxygenation: Pre-oxygenation with 100% oxygen Induction Type: IV induction Laryngoscope Size: Mac and 3 Grade View: Grade I Tube type: Oral Tube size: 8.5 mm Number of attempts: 1 Airway Equipment and Method: Stylet Placement Confirmation: ETT inserted through vocal cords under direct vision,  positive ETCO2 and breath sounds checked- equal and bilateral Secured at: 22 cm Tube secured with: Tape Dental Injury: Teeth and Oropharynx as per pre-operative assessment

## 2018-10-02 NOTE — Anesthesia Preprocedure Evaluation (Addendum)
Anesthesia Evaluation  Patient identified by MRN, date of birth, ID band Patient awake    Reviewed: Allergy & Precautions, NPO status , Patient's Chart, lab work & pertinent test results  Airway Mallampati: I       Dental  (+) Poor Dentition, Missing, Chipped,    Pulmonary COPD,  COPD inhaler, former smoker,    breath sounds clear to auscultation (-) decreased breath sounds      Cardiovascular hypertension,  Rhythm:Regular Rate:Normal     Neuro/Psych    GI/Hepatic negative GI ROS, (+)     substance abuse  alcohol use,   Endo/Other  negative endocrine ROS  Renal/GU negative Renal ROS     Musculoskeletal negative musculoskeletal ROS (+)   Abdominal Normal abdominal exam  (+)   Peds  Hematology   Anesthesia Other Findings   Reproductive/Obstetrics                            Lab Results  Component Value Date   WBC 8.9 10/02/2018   HGB 12.3 (L) 10/02/2018   HCT 34.1 (L) 10/02/2018   MCV 103.3 (H) 10/02/2018   PLT 306 10/02/2018   Lab Results  Component Value Date   CREATININE 0.78 10/02/2018   BUN 6 (L) 10/02/2018   NA 136 10/02/2018   K 3.8 10/02/2018   CL 100 10/02/2018   CO2 26 10/02/2018   Lab Results  Component Value Date   INR 1.08 10/02/2018   INR 1.33 09/20/2018   Echo: - Left ventricle: The cavity size was normal. Systolic function was   normal. The estimated ejection fraction was in the range of 60%   to 65%. Wall motion was normal; there were no regional wall   motion abnormalities. Doppler parameters are consistent with   abnormal left ventricular relaxation (grade 1 diastolic   dysfunction). - Pericardium, extracardiac: A trivial pericardial effusion was   identified.  EKG: sinus tachycardia.   Anesthesia Physical Anesthesia Plan  ASA: III  Anesthesia Plan: General   Post-op Pain Management:    Induction: Intravenous  PONV Risk Score and Plan: 3  and Ondansetron, Dexamethasone and Midazolam  Airway Management Planned: Oral ETT  Additional Equipment: None  Intra-op Plan:   Post-operative Plan: Extubation in OR  Informed Consent: I have reviewed the patients History and Physical, chart, labs and discussed the procedure including the risks, benefits and alternatives for the proposed anesthesia with the patient or authorized representative who has indicated his/her understanding and acceptance.   Dental advisory given  Plan Discussed with: CRNA  Anesthesia Plan Comments:        Anesthesia Quick Evaluation

## 2018-10-02 NOTE — Transfer of Care (Signed)
Immediate Anesthesia Transfer of Care Note  Patient: Ronald [name]  Procedure(s) Performed: VIDEO BRONCHOSCOPY WITH ENDOBRONCHIAL ULTRASOUND (N/A )  Patient Location: PACU  Anesthesia Type:General  Level of Consciousness: awake, alert , oriented and patient cooperative  Airway & Oxygen Therapy: Patient Spontanous Breathing and Patient connected to face mask oxygen  Post-op Assessment: Report given to RN and Post -op Vital signs reviewed and stable  Post vital signs: Reviewed and stable  Last Vitals:  Vitals Value Taken Time  BP 93/68 10/02/2018  3:39 PM  Temp 36.5 C 10/02/2018  3:38 PM  Pulse 88 10/02/2018  3:41 PM  Resp 19 10/02/2018  3:41 PM  SpO2 100 % 10/02/2018  3:41 PM  Vitals shown include unvalidated device data.  Last Pain:  Vitals:   10/02/18 1538  TempSrc:   PainSc: 0-No pain      Patients Stated Pain Goal: 3 (83/77/93 [id number])  Complications: No apparent anesthesia complications

## 2018-10-02 NOTE — Discharge Instructions (Signed)
You have the following appointments:   PET scan scheduled for 10/08/2018 9 AM Follow-up to be scheduled with Dominica Severin B. Benay Spice, MD on 10/09/2018 at 12 PM   Flexible Bronchoscopy, Care After These instructions give you information on caring for yourself after your procedure. Your doctor may also give you more specific instructions. Call your doctor if you have any problems or questions after your procedure. Follow these instructions at home:  The day after the test, you may eat your normal diet.  You may do your normal activities.  Keep all doctor visits. Get help right away if:  You get more and more short of breath.  You get light-headed.  You feel like you are going to pass out (faint).  You have chest pain.  You have new problems that worry you.  You cough up more than a little blood.  You cough up more blood than before. This information is not intended to replace advice given to you by your health care provider. Make sure you discuss any questions you have with your health care provider. Document Released: 08/13/2009 Document Revised: 03/23/2016 Document Reviewed: 06/20/2013 Elsevier Interactive Patient Education  2017 [employer].

## 2018-10-02 NOTE — Interval H&P Note (Signed)
History and Physical Interval Note:  10/02/2018 12:24 PM  Ronald [name]  has presented today for surgery, with the diagnosis of Mediastinal and hilar adenopathy  The various methods of treatment have been discussed with the patient and family. After consideration of risks, benefits and other options for treatment, the patient has consented to  Procedure(s): [place] (N/A) as a surgical intervention .  The patient's history has been reviewed, patient examined, no change in status, stable for surgery.  I have reviewed the patient's chart and labs.  Questions were answered to the patient's satisfaction.    Imaging reviewed. Currently on no anticoagulants or antiplatelets. Patient seen and examined in pre-op. Patient is agreeable.  All questions answered.  No barriers to proceed.  Garner Nash, DO [facility] 10/02/2018 12:25 PM  Personal pager: [phone] If unanswered, please page CCM On-call: [phone]

## 2018-10-02 NOTE — Op Note (Signed)
Video Bronchoscopy with Endobronchial Ultrasound Procedure Note  Date of Operation: 10/02/2018  Pre-op Diagnosis: Left lower lobe endobronchial lung mass, mediastinal adenopathy  Post-op Diagnosis: Left lower lobe endobronchial lung mass, mediastinal adenopathy  Surgeon: Garner Nash, DO   Assistants: None  Anesthesia: General endotracheal anesthesia  Operation: Flexible video fiberoptic bronchoscopy with endobronchial ultrasound and biopsies.  Estimated Blood Loss: <7SE  Complications: None  Indications and History: Ronald [name] is a 62 y.o. male with left lower lobe collapse, CT evidence of left lower lobe endobronchial lesion with mediastinal adenopathy.  The risks, benefits, complications, treatment options and expected outcomes were discussed with the patient.  The possibilities of pneumothorax, pneumonia, reaction to medication, pulmonary aspiration, perforation of a viscus, bleeding, failure to diagnose a condition and creating a complication requiring transfusion or operation were discussed with the patient who freely signed the consent.    Description of Procedure: The patient was examined in the preoperative area and history and data from the preprocedure consultation were reviewed. It was deemed appropriate to proceed.  The patient was taken to [facility] OR 10, identified as Ronald [name] and the procedure verified as Flexible Video Fiberoptic Bronchoscopy with endobronchial ultrasound-guided transbronchial needle aspirations, followed by bronchial washings as well as forcep and brush samples from the left lower lobe endobronchial mass.  A Time Out was held and the above information confirmed. After being taken to the operating room general anesthesia was initiated and the patient  was orally intubated. The video fiberoptic bronchoscope was introduced via the endotracheal tube and a general inspection was performed which showed bronchiectatic openings of the distal airways, normal  openings within the right lung, normal right lung anatomy, no evidence of endobronchial disease on the right side, left upper lobe appears normal anatomy, scattered rhonchi pitting throughout.  The standard bronchoscope was used for therapeutic removal of thick secretions that were layering the main carina as well as the right lower lobe. Direct inspection of the left lower lobe reveals complete occlusion of the left lower lobe with obstructing left lower lobe lung mass and extrinsic compression. The standard scope was then withdrawn and the endobronchial ultrasound was used to identify and characterize the peritracheal, hilar and bronchial lymph nodes. Inspection showed enlarged subcarinal, station 7 lymph node as well as enlarged for a low pretracheal lymph node. Using real-time ultrasound guidance Wang needle biopsies were take from Station 7 followed by station 4L nodes and were sent for cytology.  We exchanged the EBUS scope back to the standard viewing bronchoscope and completed 2 mm [employer] forcep biopsies of the left lower lobe endobronchial mass.  Prior to the force of biopsies we measured the distance from the left lower lobe mass to the main carina at approximately 2.5 cm. 10 forcep biopsies were obtained overall.  We additionally used a cytology brush with 3 passes into the lower lobe opening.  After the first 6 forcep biopsies there was minimal oozing of blood from the left lower lobe and 20 cc of iced saline was instilled and allowed to percolate for approximately 3 nights.  A Lukey trap was in place and all secretions and washings from the left mainstem bronchus was collected for cytology.  The patient tolerated the procedure well without apparent complications. There was no significant blood loss. The bronchoscope was withdrawn. Anesthesia was reversed and the patient was taken to the PACU for recovery.   Samples: 1. Station 7 transbronchial needle aspiration biopsies x8, 4 slides, 3cell  block  2. Station 4L transbronchial needle aspiration biopsies x6, 3 slides, 3 cell block 3.  Bronchial washings for culture bilateral 4.  Bronchial washings, left cytology only 5.  Forceps biopsies x10 to the left lower lobe lung mass 6.  Cytology brushings left lower lobe lung mass  Preliminary pathology: Station 7- for malignancy  Plans:  The patient will be discharged from the PACU to home when recovered from anesthesia. We will review the cytology, pathology and microbiology results with the patient when they become available. Outpatient followup will be with Ronald Graves Kerim Statzer, DO.   Of note patient also has PET scan scheduled for 10/08/2018 9 AM Follow-up to be scheduled with Dominica Severin B. Benay Spice, MD on 10/09/2018 at 12 PM  Garner Nash, DO [facility] 10/02/2018 2:32 PM

## 2018-10-02 NOTE — Anesthesia Postprocedure Evaluation (Signed)
Anesthesia Post Note  Patient: Ronald [name]  Procedure(s) Performed: VIDEO BRONCHOSCOPY WITH ENDOBRONCHIAL ULTRASOUND (N/A )     Patient location during evaluation: PACU Anesthesia Type: General Level of consciousness: awake and alert Pain management: pain level controlled Vital Signs Assessment: post-procedure vital signs reviewed and stable Respiratory status: spontaneous breathing, nonlabored ventilation, respiratory function stable and patient connected to nasal cannula oxygen Cardiovascular status: blood pressure returned to baseline and stable Postop Assessment: no apparent nausea or vomiting Anesthetic complications: no    Last Vitals:  Vitals:   10/02/18 1554 10/02/18 1601  BP: 106/65   Pulse: 88   Resp: 18   Temp:  36.5 C  SpO2: 95%     Last Pain:  Vitals:   10/02/18 1601  TempSrc:   PainSc: 0-No pain                 Effie Berkshire

## 2018-10-03 ENCOUNTER — Encounter (HOSPITAL_COMMUNITY): Payer: Self-pay | Admitting: Pulmonary Disease

## 2018-10-04 LAB — CULTURE, RESPIRATORY W GRAM STAIN

## 2018-10-04 LAB — CULTURE, RESPIRATORY: CULTURE: NORMAL

## 2018-10-05 ENCOUNTER — Telehealth: Payer: Self-pay | Admitting: Pulmonary Disease

## 2018-10-05 NOTE — Telephone Encounter (Signed)
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  There was no answer and no ability to leave voicemail.  Patient has appointments with Dr. Benay Spice at [facility] cancer care on 10/09/2018.  [facility] 10/05/2018 6:52 PM

## 2018-10-06 NOTE — Telephone Encounter (Signed)
Thanks, I will see him and get him started on therapy

## 2018-10-07 ENCOUNTER — Other Ambulatory Visit: Payer: Self-pay | Admitting: Pulmonary Disease

## 2018-10-07 ENCOUNTER — Telehealth: Payer: Self-pay

## 2018-10-07 ENCOUNTER — Ambulatory Visit (INDEPENDENT_AMBULATORY_CARE_PROVIDER_SITE_OTHER): Payer: Medicare Other | Admitting: Pulmonary Disease

## 2018-10-07 DIAGNOSIS — J449 Chronic obstructive pulmonary disease, unspecified: Secondary | ICD-10-CM

## 2018-10-07 LAB — PULMONARY FUNCTION TEST
DL/VA % pred: 93 %
DL/VA: 3.92 ml/min/mmHg/L
DLCO unc % pred: 59 %
DLCO unc: 14.53 ml/min/mmHg
FEF 25-75 Post: 0.54 L/sec
FEF 25-75 Pre: 0.67 L/sec
FEF2575-%Change-Post: -19 %
FEF2575-%Pred-Post: 23 %
FEF2575-%Pred-Pre: 29 %
FEV1-%Change-Post: -3 %
FEV1-%PRED-PRE: 50 %
FEV1-%Pred-Post: 49 %
FEV1-Post: 1.37 L
FEV1-Pre: 1.41 L
FEV1FVC-%Change-Post: 7 %
FEV1FVC-%Pred-Pre: 74 %
FEV6-%Change-Post: -12 %
FEV6-%Pred-Post: 60 %
FEV6-%Pred-Pre: 69 %
FEV6-Post: 2.13 L
FEV6-Pre: 2.44 L
FEV6FVC-%Change-Post: 0 %
FEV6FVC-%PRED-PRE: 102 %
FEV6FVC-%Pred-Post: 101 %
FVC-%Change-Post: -9 %
FVC-%Pred-Post: 61 %
FVC-%Pred-Pre: 68 %
FVC-Post: 2.28 L
FVC-Pre: 2.52 L
POST FEV1/FVC RATIO: 60 %
Post FEV6/FVC ratio: 96 %
Pre FEV1/FVC ratio: 56 %
Pre FEV6/FVC Ratio: 97 %

## 2018-10-07 NOTE — Telephone Encounter (Signed)
Patient's wife called and scheduled PFT for today at 4:00 pm and appt with BI for tomorrow at 3:00 pm.  No call back is necessary.

## 2018-10-07 NOTE — Telephone Encounter (Signed)
LMTCB. Please schedule patient appointment with BI first available and PFT's. Patient needs to be seen this week or next week.

## 2018-10-07 NOTE — Progress Notes (Signed)
PFT completed today.  

## 2018-10-08 ENCOUNTER — Other Ambulatory Visit: Payer: Self-pay | Admitting: Oncology

## 2018-10-08 ENCOUNTER — Ambulatory Visit (HOSPITAL_COMMUNITY)
Admission: RE | Admit: 2018-10-08 | Discharge: 2018-10-08 | Disposition: A | Discharge status: Home / Self Care | Payer: Medicare Other | Source: Ambulatory Visit | Attending: Oncology | Admitting: Oncology

## 2018-10-08 ENCOUNTER — Ambulatory Visit (INDEPENDENT_AMBULATORY_CARE_PROVIDER_SITE_OTHER): Payer: Medicare Other | Admitting: Pulmonary Disease

## 2018-10-08 ENCOUNTER — Encounter: Payer: Self-pay | Admitting: Pulmonary Disease

## 2018-10-08 VITALS — BP 110/70 | HR 103 | Wt 128.0 lb

## 2018-10-08 DIAGNOSIS — R059 Cough, unspecified: Secondary | ICD-10-CM

## 2018-10-08 DIAGNOSIS — C3490 Malignant neoplasm of unspecified part of unspecified bronchus or lung: Secondary | ICD-10-CM | POA: Insufficient documentation

## 2018-10-08 DIAGNOSIS — R0609 Other forms of dyspnea: Secondary | ICD-10-CM | POA: Diagnosis not present

## 2018-10-08 DIAGNOSIS — C3492 Malignant neoplasm of unspecified part of left bronchus or lung: Secondary | ICD-10-CM

## 2018-10-08 DIAGNOSIS — J449 Chronic obstructive pulmonary disease, unspecified: Secondary | ICD-10-CM

## 2018-10-08 DIAGNOSIS — R634 Abnormal weight loss: Secondary | ICD-10-CM

## 2018-10-08 DIAGNOSIS — R05 Cough: Secondary | ICD-10-CM | POA: Diagnosis not present

## 2018-10-08 LAB — GLUCOSE, CAPILLARY: Glucose-Capillary: 90 mg/dL (ref 70–99)

## 2018-10-08 MED ORDER — ALBUTEROL SULFATE (2.5 MG/3ML) 0.083% IN NEBU: 2.5000 mg | INHALATION_SOLUTION | RESPIRATORY_TRACT | 5 refills | Status: DC | PRN

## 2018-10-08 MED ORDER — FLUDEOXYGLUCOSE F - 18 (FDG) INJECTION
6.3000 | Freq: Once | INTRAVENOUS | Status: AC | PRN
  Administered 2018-10-08: 6.3 via INTRAVENOUS

## 2018-10-08 MED ORDER — TIOTROPIUM BROMIDE-OLODATEROL 2.5-2.5 MCG/ACT IN AERS: 2.0000 | INHALATION_SPRAY | Freq: Every day | RESPIRATORY_TRACT | 0 refills | Status: DC

## 2018-10-08 NOTE — Patient Instructions (Addendum)
Thank you for visiting Dr. Valeta Harms at [facility]. Today we recommend the following: Orders Placed This Encounter  Procedures  . Ambulatory referral to Cardiothoracic Surgery  . Ambulatory Referral for DME   Meds ordered this encounter  Medications  . Tiotropium Bromide-Olodaterol (STIOLTO RESPIMAT) 2.5-2.5 MCG/ACT AERS    Sig: Inhale 2 puffs into the lungs daily.    Dispense:  1 Inhaler    Refill:  0  . albuterol (PROVENTIL) (2.5 MG/3ML) 0.083% nebulizer solution    Sig: Take 3 mLs (2.5 mg total) by nebulization every 4 (four) hours as needed for wheezing or shortness of breath.    Dispense:  75 mL    Refill:  5   Return in about 2 months (around 12/09/2018).

## 2018-10-08 NOTE — Progress Notes (Signed)
Synopsis: Referred in December 2019 for lower lobe lung mass by Dr. Halford Chessman.  Subjective:   PATIENT ID: Ronald [name] GENDER: male DOB: [birth year], MRN: [id number]  Chief Complaint  Patient presents with  . Consult    Patient previously had Bronch procedure in hospital. Denies SOB and chest pain.     Patient was recently admitted to the hospital and found to have an obstructing left lower lobe mass. The patient was taken for bronchoscopy on 10/02/2018 for evaluation of the left lower lobe endobronchial lung mass and mediastinal adenopathy.  Patient underwent under endobronchial ultrasound-guided transbronchial needle aspiration biopsies of station 4L and 7 as well as forcep biopsies of the left lower lobe endobronchial mass, cytology washing, and brushings of the left lower lobe.  Both lymph nodes station 7 and 4L were negative for malignancy and to the left lower lobe endobronchial mass samplings were consistent with squamous cell carcinoma.  The patient's hospitalization he has quit smoking.  Patient had PET scan completed this morning which revealed a T2 N0 M0 disease with no evidence of metastatic focus.  PFTs were completed which reveals severe COPD, FEV1 1.37 L, 49% predicted, DLCO 59% predicted.  Currently managed with albuterol as needed.  Baseline functional status he is self-sufficient able to care for himself at this time.  Over the past couple weeks has been sick and a little debilitated.  Overall doing much better since hospitalization.  Patient tolerated bronchoscopy well.  Patient denies hemoptysis.   Past Medical History:  Diagnosis Date  . Alcoholism ([place])   . Anemia    iron deficiency  . Collapse of left lung    lower lobe  . COPD (chronic obstructive pulmonary disease) ([place])   . Family history of adverse reaction to anesthesia    sister had a bad headache  . Hypertension   . Mediastinal adenopathy     and endobronchial lesion  . Pneumonia   . TB (pulmonary tuberculosis)    . Wears dentures      Family History  Problem Relation Age of Onset  . Heart disease Mother   . Heart disease Father   . Diabetes Other   . Hypertension Other   . Ovarian cancer Maternal Grandmother      Past Surgical History:  Procedure Laterality Date  . MULTIPLE TOOTH EXTRACTIONS    . VIDEO BRONCHOSCOPY WITH ENDOBRONCHIAL ULTRASOUND N/A 10/02/2018   Procedure: VIDEO BRONCHOSCOPY WITH ENDOBRONCHIAL ULTRASOUND;  Surgeon: Garner Nash, DO;  Location: [facility] OR;  Service: Thoracic;  Laterality: N/A;    Social History   Socioeconomic History  . Marital status: Legally Separated    Spouse name: Not on file  . Number of children: Not on file  . Years of education: Not on file  . Highest education level: Not on file  Occupational History  . Not on file  Social Needs  . Financial resource strain: Not on file  . Food insecurity:    Worry: Not on file    Inability: Not on file  . Transportation needs:    Medical: Not on file    Non-medical: Not on file  Tobacco Use  . Smoking status: Former Smoker    Packs/day: 2.00    Types: Cigarettes  . Smokeless tobacco: Current User    Types: Snuff  . Tobacco comment: none since admission 08/2018  Substance and Sexual Activity  . Alcohol use: Not Currently    Alcohol/week: 126.0 standard drinks    Types: 126 Cans  of beer per week  . Drug use: No  . Sexual activity: Not on file  Lifestyle  . Physical activity:    Days per week: Not on file    Minutes per session: Not on file  . Stress: Not on file  Relationships  . Social connections:    Talks on phone: Not on file    Gets together: Not on file    Attends religious service: Not on file    Active member of club or organization: Not on file    Attends meetings of clubs or organizations: Not on file    Relationship status: Not on file  . Intimate partner violence:    Fear of current or ex partner: Not on file    Emotionally abused: Not on file    Physically abused: Not on  file    Forced sexual activity: Not on file  Other Topics Concern  . Not on file  Social History Narrative  . Not on file     No Known Allergies   Outpatient Medications Prior to Visit  Medication Sig Dispense Refill  . albuterol (PROVENTIL HFA;VENTOLIN HFA) 108 (90 Base) MCG/ACT inhaler Inhale 2 puffs into the lungs every 6 (six) hours as needed for wheezing or shortness of breath. 1 Inhaler 2  . Aspirin-Salicylamide-Caffeine (BC HEADACHE PO) Take 1 packet by mouth daily as needed (pain).    . Budesonide (RHINOCORT ALLERGY NA) Place 1 spray into the nose daily as needed (allergies).    . cetirizine (ZYRTEC) 10 MG tablet Take 10 mg by mouth daily as needed for allergies.    . feeding supplement, ENSURE ENLIVE, (ENSURE ENLIVE) LIQD Take 237 mLs by mouth 2 (two) times daily between meals. 469 mL 12  . folic acid (FOLVITE) 1 MG tablet Take 1 tablet (1 mg total) by mouth daily. 30 tablet 0  . Guaifenesin (MUCINEX MAXIMUM STRENGTH) 1200 MG TB12 Take 1,200 mg by mouth daily.    Marland Kitchen ibuprofen (ADVIL,MOTRIN) 200 MG tablet Take 400 mg by mouth daily as needed for headache or moderate pain.    . Magnesium 500 MG CAPS Take 500 mg by mouth daily.    . nicotine (NICODERM CQ - DOSED IN MG/24 HOURS) 21 mg/24hr patch Place 21 mg onto the skin daily.    Marland Kitchen thiamine 100 MG tablet Take 1 tablet (100 mg total) by mouth daily. 30 tablet 0  . nystatin (MYCOSTATIN) 100000 UNIT/ML suspension Take 5 mLs (500,000 Units total) by mouth 4 (four) times daily. (Patient not taking: Reported on 10/08/2018) 60 mL 0  . senna-docusate (SENOKOT-S) 8.6-50 MG tablet Take 1 tablet by mouth at bedtime. (Patient not taking: Reported on 10/08/2018) 30 tablet 0   No facility-administered medications prior to visit.     Review of Systems  Constitutional: Positive for malaise/fatigue and weight loss. Negative for chills and fever.  HENT: Negative for hearing loss, sore throat and tinnitus.   Eyes: Negative for blurred vision and  double vision.  Respiratory: Positive for cough, sputum production, shortness of breath and wheezing. Negative for hemoptysis and stridor.   Cardiovascular: Negative for chest pain, palpitations, orthopnea, leg swelling and PND.  Gastrointestinal: Negative for abdominal pain, constipation, diarrhea, heartburn, nausea and vomiting.  Genitourinary: Negative for dysuria, hematuria and urgency.  Musculoskeletal: Negative for joint pain and myalgias.  Skin: Negative for itching and rash.  Neurological: Negative for dizziness, tingling, weakness and headaches.  Endo/Heme/Allergies: Negative for environmental allergies. Does not bruise/bleed easily.  Psychiatric/Behavioral: Negative for  depression. The patient is not nervous/anxious and does not have insomnia.   All other systems reviewed and are negative.    Objective:  Physical Exam  Constitutional: He is oriented to person, place, and time. He appears well-developed. No distress.  HENT:  Head: Normocephalic and atraumatic.  Mouth/Throat: Oropharynx is clear and moist. No oropharyngeal exudate.  Eyes: Pupils are equal, round, and reactive to light. Conjunctivae and EOM are normal.  Neck: No JVD present. No tracheal deviation present.  Loss of supraclavicular fat  Cardiovascular: Normal rate, regular rhythm, S1 normal, S2 normal and intact distal pulses.  Distant heart tones  Pulmonary/Chest: No accessory muscle usage or stridor. No tachypnea. He has decreased breath sounds (throughout all lung fields). He has wheezes. He has no rhonchi. He has no rales.  Increased AP chest diameter  Abdominal: Soft. Bowel sounds are normal. He exhibits no distension. There is no tenderness.  Musculoskeletal: He exhibits no edema or deformity.  Neurological: He is alert and oriented to person, place, and time.  Skin: Skin is warm and dry. Capillary refill takes less than 2 seconds. No rash noted.  Psychiatric: He has a normal mood and affect. His behavior is  normal.  Vitals reviewed.    Vitals:   10/08/18 1515  BP: 110/70  Pulse: (!) 103  SpO2: 99%  Weight: 128 lb (58.1 kg)   99% on RA BMI Readings from Last 3 Encounters:  10/08/18 21.97 kg/m  10/02/18 21.94 kg/m  09/27/18 21.94 kg/m   Wt Readings from Last 3 Encounters:  10/08/18 128 lb (58.1 kg)  10/02/18 127 lb 12.8 oz (58 kg)  09/27/18 127 lb 12.8 oz (58 kg)     CBC    Component Value Date/Time   WBC 8.9 10/02/2018 1047   RBC 3.30 (L) 10/02/2018 1047   HGB 12.3 (L) 10/02/2018 1047   HCT 34.1 (L) 10/02/2018 1047   PLT 306 10/02/2018 1047   MCV 103.3 (H) 10/02/2018 1047   MCH 37.3 (H) 10/02/2018 1047   MCHC 36.1 (H) 10/02/2018 1047   RDW 14.6 10/02/2018 1047   LYMPHSABS 2.1 09/24/2018 0226   MONOABS 0.8 09/24/2018 0226   EOSABS 0.1 09/24/2018 0226   BASOSABS 0.1 09/24/2018 0226    Chest Imaging: 10/08/2018 pet imaging No evidence of metastatic disease, T2 N0 M0 by pet imaging. The patient's images have been independently reviewed by me.    Pulmonary Functions Testing Results: PFT Results Latest Ref Rng & Units 10/07/2018 09/23/2018  FVC-Pre L 2.52 -  FVC-Predicted Pre % 68 35  FVC-Post L 2.28 -  FVC-Predicted Post % 61 -  Pre FEV1/FVC % % 56 56  Post FEV1/FCV % % 60 -  FEV1-Pre L 1.41 0.74  FEV1-Predicted Pre % 50 26  FEV1-Post L 1.37 -  DLCO UNC% % 59 -  DLCO COR %Predicted % 93 -    FeNO: None   Pathology:    Bronchoscopy image: 100% occlusion of the LLL Tumor at the level of the left main subcarina   08/02/2018: Bronchoscopy pathology Cytology, washings, brushings and forcep biopsies of the left lower lobe endobronchial mass consistent with squamous cell carcinoma Station 4L and 7- for malignancy  Echocardiogram: None   Heart Catheterization: None     Assessment & Plan:   Squamous cell carcinoma lung, left (HCC) - Plan: Ambulatory referral to Cardiothoracic Surgery  Chronic obstructive pulmonary disease, unspecified COPD type ([place])  - Plan: Ambulatory Referral for DME  Discussion:  This is a 62 year old  gentleman with newly diagnosed squamous cell carcinoma of the left lung.  Pet imaging is reassuring with no evidence of metastatic disease, EBUS bronchoscopy also reassuring with no malignancy within station 7 or 4L.  Patient has plan oncology follow-up tomorrow.  Pulmonary function tests reveal an FEV1 of 1.73 L, 49% predicted and a DLCO 59%.  Keep in mind that his left lower lobe is fully obstructed and nonfunctioning.  Plan: I will refer to cardiothoracic surgery for evaluation of lobectomy. As for management of his underlying COPD we will start patient on Stiolto. We will set up with nebulizer machine and albuterol solution. Patient to return follow-up with me in 2 months after being seen by medical oncology as well as TCTS.  Greater than 50% of this patient's 40-minute office was spent face-to-face discussing the above recommendations and treatment plan.    Current Outpatient Medications:  .  albuterol (PROVENTIL HFA;VENTOLIN HFA) 108 (90 Base) MCG/ACT inhaler, Inhale 2 puffs into the lungs every 6 (six) hours as needed for wheezing or shortness of breath., Disp: 1 Inhaler, Rfl: 2 .  Aspirin-Salicylamide-Caffeine (BC HEADACHE PO), Take 1 packet by mouth daily as needed (pain)., Disp: , Rfl:  .  Budesonide (RHINOCORT ALLERGY NA), Place 1 spray into the nose daily as needed (allergies)., Disp: , Rfl:  .  cetirizine (ZYRTEC) 10 MG tablet, Take 10 mg by mouth daily as needed for allergies., Disp: , Rfl:  .  feeding supplement, ENSURE ENLIVE, (ENSURE ENLIVE) LIQD, Take 237 mLs by mouth 2 (two) times daily between meals., Disp: 237 mL, Rfl: 12 .  folic acid (FOLVITE) 1 MG tablet, Take 1 tablet (1 mg total) by mouth daily., Disp: 30 tablet, Rfl: 0 .  Guaifenesin (MUCINEX MAXIMUM STRENGTH) 1200 MG TB12, Take 1,200 mg by mouth daily., Disp: , Rfl:  .  ibuprofen (ADVIL,MOTRIN) 200 MG tablet, Take 400 mg by mouth daily as  needed for headache or moderate pain., Disp: , Rfl:  .  Magnesium 500 MG CAPS, Take 500 mg by mouth daily., Disp: , Rfl:  .  nicotine (NICODERM CQ - DOSED IN MG/24 HOURS) 21 mg/24hr patch, Place 21 mg onto the skin daily., Disp: , Rfl:  .  thiamine 100 MG tablet, Take 1 tablet (100 mg total) by mouth daily., Disp: 30 tablet, Rfl: 0   Garner Nash, DO [facility] 10/08/2018 3:22 PM

## 2018-10-09 ENCOUNTER — Inpatient Hospital Stay: Payer: Medicare Other | Attending: Oncology | Admitting: Oncology

## 2018-10-09 VITALS — BP 107/71 | HR 98 | Temp 97.0°F | Resp 19 | Ht 64.0 in | Wt 128.7 lb

## 2018-10-09 DIAGNOSIS — D649 Anemia, unspecified: Secondary | ICD-10-CM

## 2018-10-09 DIAGNOSIS — K7460 Unspecified cirrhosis of liver: Secondary | ICD-10-CM

## 2018-10-09 DIAGNOSIS — C3432 Malignant neoplasm of lower lobe, left bronchus or lung: Secondary | ICD-10-CM

## 2018-10-09 DIAGNOSIS — R634 Abnormal weight loss: Secondary | ICD-10-CM

## 2018-10-09 DIAGNOSIS — J449 Chronic obstructive pulmonary disease, unspecified: Secondary | ICD-10-CM | POA: Diagnosis not present

## 2018-10-09 DIAGNOSIS — C3490 Malignant neoplasm of unspecified part of unspecified bronchus or lung: Secondary | ICD-10-CM

## 2018-10-09 NOTE — Progress Notes (Signed)
[facility] OFFICE PROGRESS NOTE   Diagnosis: Non-small cell lung cancer  INTERVAL HISTORY:   Ronald [name] returns for a scheduled visit.  I saw him while hospitalized with pneumonia and respiratory failure last month.  He was found to have a left lung mass. He was referred to Dr. Valeta Harms and taken to a bronchoscopy on 10/02/2018.  He was found to have a left lower lobe endobronchial mass.  Brushings and biopsies were obtained.  There was complete occlusion of the left lower lobe with a mass and extrinsic compression.  Endobronchial ultrasound was used to biopsy station 7 and 4L nodes.  The left endobronchial mass was measured at 2.5 cm from the carina. 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  Ronald [name] reports feeling much better compared to when I saw him in the hospital last month.  He underwent pulmonary function studies on 10/07/2018.  The FEV1 returned at 1.41. A staging PET scan on 10/08/2018 revealed a hypermetabolic left infrahilar mass obstructing the left lower lobe bronchus.  Postobstructive collapse of the entire left lower lobe.  A 4 mm right lower lobe nodule is unchanged from a CT abdomen 09/30/2014.  Nodular liver with a moderate volume of free fluid in the abdomen and pelvis. No mediastinal adenopathy.  Objective:  Vital signs in last 24 hours:  Blood pressure 107/71, pulse 98, temperature (!) 97 F (36.1 C), resp. rate 19, height 5\' 4"  (1.626 m), weight 128 lb 11.2 oz (58.4 kg), SpO2 100 %.    HEENT: Neck without mass Lymphatics: No cervical or supraclavicular nodes Resp: Decreased breath sounds at the left lower chest, distant breath sounds throughout, no respiratory distress Cardio: Regular rate and rhythm GI: No hepatosplenomegaly, nontender Vascular: No leg edema   Lab Results:  Lab Results  Component Value Date   WBC 8.9 10/02/2018   HGB 12.3 (L)  10/02/2018   HCT 34.1 (L) 10/02/2018   MCV 103.3 (H) 10/02/2018   PLT 306 10/02/2018   NEUTROABS 10.0 (H) 09/24/2018    CMP  Lab Results  Component Value Date   NA 136 10/02/2018   K 3.8 10/02/2018   CL 100 10/02/2018   CO2 26 10/02/2018   GLUCOSE 101 (H) 10/02/2018   BUN 6 (L) 10/02/2018   CREATININE 0.78 10/02/2018   CALCIUM 9.0 10/02/2018   PROT 7.3 10/02/2018   ALBUMIN 2.3 (L) 10/02/2018   AST 39 10/02/2018   ALT 21 10/02/2018   ALKPHOS 83 10/02/2018   BILITOT 0.4 10/02/2018   GFRNONAA >60 10/02/2018   GFRAA >60 10/02/2018     Imaging:  Nm Pet Image Initial (pi) Skull Base To Thigh  Result Date: 10/08/2018 CLINICAL DATA:  Initial treatment strategy for lung carcinoma. EXAM: NUCLEAR MEDICINE PET SKULL BASE TO THIGH TECHNIQUE: 6.3 mCi F-18 FDG was injected intravenously. Full-ring PET imaging was performed from the skull base to thigh after the radiotracer. CT data was obtained and used for attenuation correction and anatomic localization. Fasting blood glucose: 90 mg/dl COMPARISON:  Chest CT 09/19/2018 FINDINGS: Mediastinal blood pool activity: SUV max 1.9 NECK: No hypermetabolic lymph nodes in the neck. Incidental CT findings: none CHEST: LEFT infrahilar mass obstructs the LEFT lower lobe bronchus. Mass measures approximately 3.4 by 2.0 cm and has intense metabolic activity with SUV max equal 10.2. There is postobstructive collapse of the entire LEFT lower lobe. no hypermetabolic mediastinal adenopathy. Diffuse activity throughout the  entire esophagus is mild-to-moderate. Small 4 mm pulmonary nodule RIGHT lower lobe (image 88/4) is unchanged from CT abdomen 09/30/2014 Incidental CT findings: none ABDOMEN/PELVIS: No abnormal hypermetabolic activity within the liver, pancreas, adrenal glands, or spleen. No hypermetabolic lymph nodes in the abdomen or pelvis. Incidental CT findings: Liver has a nodular contour. There is a moderate volume of free fluid in the abdomen pelvis. Spleen  is normal volume. Atherosclerotic calcification of the aorta. SKELETON: No focal hypermetabolic activity to suggest skeletal metastasis. Incidental CT findings: none IMPRESSION: 1. Hypermetabolic LEFT infrahilar mass consistent bronchogenic carcinoma. 2. LEFT lower lobe mass obstructs the LEFT lobe bronchus. 3. No evidence of mediastinal adenopathy. 4. Diffuse esophageal activity is most consistent with esophagitis. 5. No evidence of metastatic disease.  PET-CT staging: T2 a N0 M0 6. Cirrhotic liver with ascites. Electronically Signed   By: Suzy Bouchard M.D.   On: 10/08/2018 13:09   PET images reviewed Medications: I have reviewed the patient's current medications.   Assessment/Plan:  1.    Non-small cell lung cancer-squamous cell carcinoma  CT chest 09/19/2018 concerning for an obstructing left lower lobe mass, small left pleural effusion, 4 mm right lower lobe nodule  CT head 09/20/2018- no acute abnormality  Bronchoscopy 10/02/2018- complete occlusion of the left lower lobe with an obstructing mass, biopsies, washing, and brushing reveal squamous cell carcinoma, biopsies of level 7 and 4L nodes-negative  PET scan 10/08/2018-hypermetabolic left hilar mass obstructing the left lower lobe bronchus with left lower lobe collapse, no evidence of metastatic lymphadenopathy or distant metastases 2.  Tobacco and alcohol use 3.  COPD 4.  Weight loss 5.  Anemia 6.  History of tuberculosis 7.  Changes of cirrhosis on the PET scan 10/08/2018    Disposition: Ronald [name] appears well.  He has recovered from the admission with postobstructive pneumonia.  He has been diagnosed with squamous cell carcinoma of the left lung.  There is a proximal left lower lobe mass with complete obstruction of the left lower lobe bronchus.  There is no clinical or imaging evidence of distant metastatic disease.  Ronald [name] is scheduled to see Dr. Roxan Hockey on 10/11/2018 to discuss surgical options.  I recommend  proceeding with surgical resection if Ronald [name] is deemed a surgical candidate.  If not we will plan for chemotherapy and radiation.  I will follow-up on the recommendations of Dr. Gorden Harms and arrange for outpatient follow-up in medical oncology.  I discussed the diagnosis and treatment options with Ronald [name] and Ronald [name].  25 minutes were spent with the patient today.  The majority of the time was used for counseling and coordination of care.  Betsy Coder, MD  10/09/2018  2:06 PM

## 2018-10-10 ENCOUNTER — Other Ambulatory Visit: Payer: Self-pay | Admitting: *Deleted

## 2018-10-10 NOTE — Progress Notes (Signed)
The proposed treatment discussed in cancer conference 10/10/18 is for discussion purpose and is not a binging recommendation.  The patient was not physically examined nor present therefore, final treatment plans cannot be decided.

## 2018-10-11 ENCOUNTER — Encounter: Payer: Self-pay | Admitting: *Deleted

## 2018-10-11 ENCOUNTER — Institutional Professional Consult (permissible substitution) (INDEPENDENT_AMBULATORY_CARE_PROVIDER_SITE_OTHER): Payer: Medicare Other | Admitting: Thoracic Surgery (Cardiothoracic Vascular Surgery)

## 2018-10-11 VITALS — BP 113/70 | HR 100 | Resp 20 | Ht 64.0 in | Wt 128.0 lb

## 2018-10-11 DIAGNOSIS — C3492 Malignant neoplasm of unspecified part of left bronchus or lung: Secondary | ICD-10-CM

## 2018-10-11 NOTE — Progress Notes (Signed)
Oncology Nurse Navigator Documentation  Oncology Nurse Navigator Flowsheets 10/11/2018  Navigator Location [facility]  Navigator Encounter Type Other/per Dr. Benay Spice, I requested PDL 1 testing on recent biopsy.    Treatment Phase Pre-Tx/Tx Discussion  Barriers/Navigation Needs Coordination of Care  Interventions Coordination of Care  Coordination of Care Other  Acuity Level 2  Time Spent with Patient 15

## 2018-10-11 NOTE — Progress Notes (Signed)
PCP is Patient, No Pcp Per Referring Provider is [facility], Octavio Graves, DO  Chief Complaint  Patient presents with  . Lung Cancer    Surgical eval, PET Scan  10/08/18, PFT's 10/07/18, EBUS 10/02/18, Chest CT 09/19/18, Head CT 09/20/18,     HPI: [name] consultation regarding a left lower lobe lung cancer.  Ronald [name] is a 62 year old man with a history of tobacco abuse, COPD, alcoholism, hypertension, tuberculosis, anemia, and recent pneumonia.  He was admitted to the hospital on 09/19/2018.  He presented with a 2-week history of feeling lightheaded and having leg weakness.  He also had a 12 pound weight loss over 3 months, congestion and cough.  He was admitted and treated for a pneumonia.  He was found to have complete collapse of the left lower lobe with an endobronchial mass lesion.  He was treated with antibiotics and his symptoms did improve.  Dr. Valeta Harms did a bronchoscopy on 10/02/2018.  It showed an endobronchial mass lesion at the origin of the left lower lobe bronchus completely occluding the airway.  Biopsies were positive for squamous cell carcinoma.  Aspirations of 4L and 7 lymph nodes were negative for tumor.  A PET CT showed a 3.4 x 2 cm central left lower lobe mass with obstruction of the left lower lobe bronchus.  The SUV was 10.2.  There was postobstructive atelectasis.  There was no mediastinal adenopathy.  There was a small left pleural effusion.  There is diffuse activity in the esophagus consistent with esophagitis.  He also had cirrhosis with ascites.  He says that he has not smoked or had a drink since he was in the hospital.  He was sent home with oxygen.  He is only using that occasionally.  He does have a productive cough.  He denies hemoptysis or wheezing.  He denies any unusual headaches or visual changes.  He does not have any unusual bone or joint pain.  He denies chest pain, pressure, or tightness with exertion.  His activities are relatively limited he does get short of breath  with walking up an incline. Zubrod Score: At the time of surgery this patient's most appropriate activity status/level should be described as: []     0    Normal activity, no symptoms [x]     1    Restricted in physical strenuous activity but ambulatory, able to do out light work []     2    Ambulatory and capable of self care, unable to do work activities, up and about >50 % of waking hours                              []     3    Only limited self care, in bed greater than 50% of waking hours []     4    Completely disabled, no self care, confined to bed or chair []     5    Moribund  Past Medical History:  Diagnosis Date  . Alcoholism ([place])   . Anemia    iron deficiency  . Collapse of left lung    lower lobe  . COPD (chronic obstructive pulmonary disease) ([place])   . Family history of adverse reaction to anesthesia    sister had a bad headache  . Hypertension   . Mediastinal adenopathy     and endobronchial lesion  . Pneumonia   . TB (pulmonary tuberculosis)   . Wears dentures  Past Surgical History:  Procedure Laterality Date  . MULTIPLE TOOTH EXTRACTIONS    . VIDEO BRONCHOSCOPY WITH ENDOBRONCHIAL ULTRASOUND N/A 10/02/2018   Procedure: VIDEO BRONCHOSCOPY WITH ENDOBRONCHIAL ULTRASOUND;  Surgeon: Garner Nash, DO;  Location: [facility] OR;  Service: Thoracic;  Laterality: N/A;    Family History  Problem Relation Age of Onset  . Heart disease Mother   . Heart disease Father   . Diabetes Other   . Hypertension Other   . Ovarian cancer Maternal Grandmother     Social History Social History   Tobacco Use  . Smoking status: Former Smoker    Packs/day: 2.00    Types: Cigarettes  . Smokeless tobacco: Current User    Types: Snuff  . Tobacco comment: none since admission 08/2018  Substance Use Topics  . Alcohol use: Not Currently    Alcohol/week: 126.0 standard drinks    Types: 126 Cans of beer per week  . Drug use: No    Current Outpatient Medications  Medication Sig  Dispense Refill  . albuterol (PROVENTIL HFA;VENTOLIN HFA) 108 (90 Base) MCG/ACT inhaler Inhale 2 puffs into the lungs every 6 (six) hours as needed for wheezing or shortness of breath. 1 Inhaler 2  . albuterol (PROVENTIL) (2.5 MG/3ML) 0.083% nebulizer solution Take 3 mLs (2.5 mg total) by nebulization every 4 (four) hours as needed for wheezing or shortness of breath. 75 mL 5  . Aspirin-Salicylamide-Caffeine (BC HEADACHE PO) Take 1 packet by mouth daily as needed (pain).    . Budesonide (RHINOCORT ALLERGY NA) Place 1 spray into the nose daily as needed (allergies).    . cetirizine (ZYRTEC) 10 MG tablet Take 10 mg by mouth daily as needed for allergies.    . feeding supplement, ENSURE ENLIVE, (ENSURE ENLIVE) LIQD Take 237 mLs by mouth 2 (two) times daily between meals. 417 mL 12  . folic acid (FOLVITE) 1 MG tablet Take 1 tablet (1 mg total) by mouth daily. 30 tablet 0  . Guaifenesin (MUCINEX MAXIMUM STRENGTH) 1200 MG TB12 Take 1,200 mg by mouth daily.    Marland Kitchen ibuprofen (ADVIL,MOTRIN) 200 MG tablet Take 400 mg by mouth daily as needed for headache or moderate pain.    . Magnesium 500 MG CAPS Take 500 mg by mouth daily.    . nicotine (NICODERM CQ - DOSED IN MG/24 HOURS) 21 mg/24hr patch Place 21 mg onto the skin daily.    Marland Kitchen thiamine 100 MG tablet Take 1 tablet (100 mg total) by mouth daily. 30 tablet 0  . Tiotropium Bromide-Olodaterol (STIOLTO RESPIMAT) 2.5-2.5 MCG/ACT AERS Inhale 2 puffs into the lungs daily. 1 Inhaler 0   No current facility-administered medications for this visit.     No Known Allergies  Review of Systems  Constitutional: Positive for unexpected weight change (12 pounds in 3 months).  HENT: Positive for dental problem (Dentures). Negative for trouble swallowing and voice change.   Eyes: Negative for visual disturbance.  Respiratory: Positive for cough and shortness of breath. Negative for wheezing.   Cardiovascular: Negative for chest pain and leg swelling.  Genitourinary:  Negative for difficulty urinating and dysuria.  Musculoskeletal: Negative for arthralgias and myalgias.  Neurological: Positive for dizziness. Negative for seizures and syncope.  Hematological: Negative for adenopathy. Does not bruise/bleed easily.  All other systems reviewed and are negative.   BP 113/70   Pulse 100   Resp 20   Ht 5\' 4"  (1.626 m)   Wt 128 lb (58.1 kg)   SpO2 98%  BMI 21.97 kg/m  Physical Exam Vitals signs reviewed.  Constitutional:      General: He is not in acute distress. HENT:     Head: Normocephalic and atraumatic.  Eyes:     General: No scleral icterus.    Pupils: Pupils are equal, round, and reactive to light.  Neck:     Musculoskeletal: Neck supple.     Vascular: No carotid bruit.  Cardiovascular:     Rate and Rhythm: Normal rate and regular rhythm.     Pulses: Normal pulses.     Heart sounds: No murmur. No gallop.   Pulmonary:     Effort: Pulmonary effort is normal. No respiratory distress.     Breath sounds: Rhonchi (Bilateral) present. No wheezing.     Comments: Diminished breath sounds at left base Abdominal:     General: There is no distension.     Palpations: Abdomen is soft.     Tenderness: There is no abdominal tenderness.  Musculoskeletal:        General: No swelling.     Right lower leg: No edema.     Left lower leg: No edema.  Lymphadenopathy:     Cervical: No cervical adenopathy.  Skin:    General: Skin is warm and dry.  Neurological:     General: No focal deficit present.     Mental Status: He is alert and oriented to person, place, and time.     Motor: No weakness.     Coordination: Coordination normal.    Diagnostic Tests: CT CHEST WITH CONTRAST  TECHNIQUE: Multidetector CT imaging of the chest was performed during intravenous contrast administration.  CONTRAST:  60mL OMNIPAQUE IOHEXOL 300 MG/ML  SOLN  COMPARISON:  Chest x-ray September 19, 2018  FINDINGS: Cardiovascular: There is atherosclerotic change in  the thoracic aorta without dissection. No aneurysm. The heart size is normal. No obvious coronary artery calcifications. The main pulmonary artery is unremarkable. The left lower lobe central pulmonary artery is narrowed over a short distance in the infrahilar region before returning to more normal size. This is well seen on coronal image 80.  Mediastinum/Nodes: The thyroid and esophagus are unremarkable. There is a left-sided pleural effusion. No pericardial effusion or right pleural effusion. Soft tissue fullness in the inferior left hilum suspicious for a centrally obstructing mass. No other adenopathy identified.  Lungs/Pleura: There is mucus in the distal trachea and mainstem bronchi. The trachea, mainstem bronchi, right-sided airways, and left upper lobe airways are normal. There is abrupt cut off of the left lower lobe bronchus with complete collapse of the left lower lobe. While it is difficult to discern a discrete mass, I am suspicious the abrupt cut off of the left lower lobe bronchus and left lower lobe collapse may be due to a centrally obstructing mass with soft tissue fullness in the infrahilar region. There is a small left-sided pleural effusion. No right-sided pleural effusion. No pneumothorax. There is a nodule in the right lower lobe on series 4, image 115 measuring 4 mm. No other pulmonary nodules, masses, or infiltrates.  Upper Abdomen: No acute abnormality.  Musculoskeletal: No chest wall abnormality. No acute or significant osseous findings.  IMPRESSION: 1. The findings are concerning for a centrally obstructing mass/malignancy resulting in collapse of the left lower lobe. While a discrete mass cannot be measured, there is soft tissue fullness in the left infrahilar region. There is also abrupt cut off of the left lower lobe bronchus and narrowing of the left lower lobe  pulmonary artery as it traverses this region. Recommend pulmonary  consultation with consideration of bronchoscopy. 2. Atherosclerotic change in the thoracic aorta. 3. Small left-sided pleural effusion. 4. 4 mm nodule in the right lower lobe, too small to characterize.  Aortic Atherosclerosis (ICD10-I70.0).   Electronically Signed   By: Dorise Bullion III M.D   On: 09/19/2018 20:57 NUCLEAR MEDICINE PET SKULL BASE TO THIGH  TECHNIQUE: 6.3 mCi F-18 FDG was injected intravenously. Full-ring PET imaging was performed from the skull base to thigh after the radiotracer. CT data was obtained and used for attenuation correction and anatomic localization.  Fasting blood glucose: 90 mg/dl  COMPARISON:  Chest CT 09/19/2018  FINDINGS: Mediastinal blood pool activity: SUV max 1.9  NECK: No hypermetabolic lymph nodes in the neck.  Incidental CT findings: none  CHEST: LEFT infrahilar mass obstructs the LEFT lower lobe bronchus. Mass measures approximately 3.4 by 2.0 cm and has intense metabolic activity with SUV max equal 10.2. There is postobstructive collapse of the entire LEFT lower lobe.  no hypermetabolic mediastinal adenopathy. Diffuse activity throughout the entire esophagus is mild-to-moderate.  Small 4 mm pulmonary nodule RIGHT lower lobe (image 88/4) is unchanged from CT abdomen 09/30/2014  Incidental CT findings: none  ABDOMEN/PELVIS: No abnormal hypermetabolic activity within the liver, pancreas, adrenal glands, or spleen. No hypermetabolic lymph nodes in the abdomen or pelvis.  Incidental CT findings: Liver has a nodular contour. There is a moderate volume of free fluid in the abdomen pelvis.  Spleen is normal volume.  Atherosclerotic calcification of the aorta.  SKELETON: No focal hypermetabolic activity to suggest skeletal metastasis.  Incidental CT findings: none  IMPRESSION: 1. Hypermetabolic LEFT infrahilar mass consistent bronchogenic carcinoma. 2. LEFT lower lobe mass obstructs the LEFT lobe  bronchus. 3. No evidence of mediastinal adenopathy. 4. Diffuse esophageal activity is most consistent with esophagitis. 5. No evidence of metastatic disease.  PET-CT staging: T2 a N0 M0 6. Cirrhotic liver with ascites.   Electronically Signed   By: Suzy Bouchard M.D.   On: 10/08/2018 13:09 I personally reviewed the CT and PET/CT images and concur with the findings noted above  Rosanne Gutting function testing FVC 2.52 (68%) FEV1 1.41 (50%) FEV1 1.37 (49%) postbronchodilator DLCO 14.53 (59%)  Impression: [name] is a 62 year old man with a history of tobacco and ethanol abuse, COPD, remote TB, hypertension, cirrhosis with ascites, and recent pneumonia.  He was admitted about 3 weeks ago with pneumonia.  He was treated with antibiotics and had improvement of his symptoms.  He was found to have a central left lower lobe mass with postobstructive atelectasis of the left lower lobe.  Biopsies revealed squamous cell carcinoma.  PET/CT showed no evidence of regional or distant metastases.  He has clinical stage Ib disease (T2, N0, M0).  He does have a small left pleural effusion which is larger on the PET than it was on his CT scan.  That is concerning for potential malignant effusion however there was no significant activity associated with that on PET.  He has significant COPD with an FEV1 of 50% of predicted.  That is somewhat artificially low due to total obstruction of the lower lobe.  Reviewing the CT and PET films and the pictures from bronchoscopy, I think it would take a pneumonectomy to achieve a complete resection.  At best he is a borderline candidate, and would need some additional assessment of his pulmonary reserve.  I discussed this with Mr. Mccamish and his sister and they were both  reluctant to consider that procedure.  He would prefer to be treated with chemo and radiation.  Where he did have a response to chemo and radiation to the point where he might be a candidate for a lobectomy, we  could reconsider surgery at that time.  It would pretty much preclude any possibility of doing a pneumonectomy.  Tobacco abuse-he has not smoked since he was in the hospital almost a month ago.  I congratulated him on that and emphasized the importance of continued abstinence.  Ethanol abuse-also has not had anything to drink since he was in the hospital.  I emphasized the importance of that as well.  I did discuss with them that he does have evidence of cirrhosis and ascites on his PET/CT.  Plan: Follow-up with Dr. Benay Spice for consideration for radiation and chemotherapy.  If he has a significant response we could consider lobectomy.  Melrose Nakayama, MD [facility] [phone]

## 2018-10-14 ENCOUNTER — Encounter: Payer: Self-pay | Admitting: *Deleted

## 2018-10-14 ENCOUNTER — Telehealth: Payer: Self-pay | Admitting: Oncology

## 2018-10-14 DIAGNOSIS — R590 Localized enlarged lymph nodes: Secondary | ICD-10-CM

## 2018-10-14 NOTE — Progress Notes (Signed)
Oncology Nurse Navigator Documentation  Oncology Nurse Navigator Flowsheets 10/14/2018  Navigator Location [facility]  Navigator Encounter Type Other/per Dr. Benay Spice, ambulatory referral to [facility] completed and will update scheduling to call and schedule patient to see Dr. Benay Spice.   Treatment Phase Pre-Tx/Tx Discussion  Barriers/Navigation Needs Coordination of Care  Interventions Coordination of Care  Coordination of Care Other  Acuity Level 2  Time Spent with Patient 15

## 2018-10-14 NOTE — Telephone Encounter (Signed)
Scheduled appt per 12/16 sch message - left message for patient with appt date and time

## 2018-10-15 ENCOUNTER — Telehealth: Payer: Self-pay | Admitting: Oncology

## 2018-10-15 ENCOUNTER — Telehealth: Payer: Self-pay | Admitting: *Deleted

## 2018-10-15 NOTE — Telephone Encounter (Signed)
Called patient per 12/17 sch message - unable to reach patient - left message for patient to call back and r/s at patients convenience,

## 2018-10-15 NOTE — Telephone Encounter (Signed)
Patient called to inquire when his appointment with Dr. Benay Spice was. Informed him it is tomorrow, 12/18 at 2 pm. He also requests we contact his sister with the appointment and all appointments since she drive him and coordinates all these. Called sister and left VM with the appointment information.

## 2018-10-16 ENCOUNTER — Encounter: Payer: Self-pay | Admitting: *Deleted

## 2018-10-16 ENCOUNTER — Ambulatory Visit: Payer: Medicare Other | Admitting: Oncology

## 2018-10-16 NOTE — Progress Notes (Signed)
Oncology Nurse Navigator Documentation  Oncology Nurse Navigator Flowsheets 10/16/2018  Navigator Location [facility]  Navigator Encounter Type Other/Ronald [name] was a no show for his appt today with Dr. Benay Spice.  I contacted new patient coordinator to call and re-schedule.   Treatment Phase Pre-Tx/Tx Discussion  Barriers/Navigation Needs Coordination of Care  Interventions Coordination of Care  Coordination of Care Other  Acuity Level 1  Time Spent with Patient 15

## 2018-10-21 ENCOUNTER — Encounter: Payer: Self-pay | Admitting: *Deleted

## 2018-10-21 ENCOUNTER — Telehealth: Payer: Self-pay | Admitting: *Deleted

## 2018-10-21 NOTE — Telephone Encounter (Signed)
Dr. Benay Spice wants to see patient on 12/26 at 2:00 pm. Call to sister. NO answer, so voice mail with the appointment was left. Attempted to reach patient, but did not answer and no voice mail was available.

## 2018-10-21 NOTE — Progress Notes (Signed)
Oncology Nurse Navigator Documentation  Oncology Nurse Navigator Flowsheets 10/21/2018  Navigator Location [facility]  Navigator Encounter Type Other/Patient was a no show to a recent appt with Dr. Benay Spice.  I contacted new patient coordinator and Dr. Benay Spice on when would be a good time for him to be seen.  Wait for response.   Abnormal Finding Date 09/19/2018  Confirmed Diagnosis Date 10/02/2018  Treatment Phase Pre-Tx/Tx Discussion  Barriers/Navigation Needs Coordination of Care  Interventions Coordination of Care  Coordination of Care Other  Acuity Level 1  Time Spent with Patient 15

## 2018-10-24 ENCOUNTER — Inpatient Hospital Stay: Payer: Medicare Other | Admitting: Oncology

## 2018-10-24 ENCOUNTER — Telehealth: Payer: Self-pay | Admitting: Oncology

## 2018-10-24 NOTE — Telephone Encounter (Signed)
Pt sister r/s appt per 12/26 sch message - she is aware of appt date and time

## 2018-10-25 ENCOUNTER — Telehealth: Payer: Self-pay | Admitting: *Deleted

## 2018-10-25 NOTE — Telephone Encounter (Signed)
Oncology Nurse Navigator Documentation  Oncology Nurse Navigator Flowsheets 10/25/2018  Navigator Location [facility]  Navigator Encounter Type Telephone/I called Mr. Taddei to remind him of his appt with Dr. Benay Spice and Dr. Sondra Come.  His sister answered and stated she was aware of appts but was sick and could not bring him.  I told her we have transportation help if needed.  She states she will get him here at his next appt.   I will reach out to transportation coordinator and update he may have needs going through treatment.   Telephone Outgoing Call  Treatment Phase Pre-Tx/Tx Discussion  Barriers/Navigation Needs Education;Coordination of Care  Education Other  Interventions Coordination of Care;Education  Coordination of Care Other  Education Method Verbal  Acuity Level 2  Time Spent with Patient 30

## 2018-10-27 ENCOUNTER — Emergency Department (HOSPITAL_COMMUNITY): Payer: Medicare Other

## 2018-10-27 ENCOUNTER — Other Ambulatory Visit: Payer: Self-pay

## 2018-10-27 ENCOUNTER — Encounter (HOSPITAL_COMMUNITY): Payer: Self-pay

## 2018-10-27 ENCOUNTER — Emergency Department (HOSPITAL_COMMUNITY)
Admission: EM | Admit: 2018-10-27 | Discharge: 2018-10-28 | Disposition: A | Discharge status: Home / Self Care | Payer: Medicare Other | Attending: Emergency Medicine | Admitting: Emergency Medicine

## 2018-10-27 DIAGNOSIS — J189 Pneumonia, unspecified organism: Secondary | ICD-10-CM

## 2018-10-27 DIAGNOSIS — Z87891 Personal history of nicotine dependence: Secondary | ICD-10-CM | POA: Diagnosis not present

## 2018-10-27 DIAGNOSIS — I10 Essential (primary) hypertension: Secondary | ICD-10-CM | POA: Diagnosis not present

## 2018-10-27 DIAGNOSIS — I2510 Atherosclerotic heart disease of native coronary artery without angina pectoris: Secondary | ICD-10-CM | POA: Insufficient documentation

## 2018-10-27 DIAGNOSIS — Z79899 Other long term (current) drug therapy: Secondary | ICD-10-CM | POA: Insufficient documentation

## 2018-10-27 DIAGNOSIS — R0602 Shortness of breath: Secondary | ICD-10-CM | POA: Insufficient documentation

## 2018-10-27 DIAGNOSIS — J181 Lobar pneumonia, unspecified organism: Secondary | ICD-10-CM

## 2018-10-27 DIAGNOSIS — R05 Cough: Secondary | ICD-10-CM | POA: Diagnosis not present

## 2018-10-27 DIAGNOSIS — J449 Chronic obstructive pulmonary disease, unspecified: Secondary | ICD-10-CM | POA: Diagnosis not present

## 2018-10-27 LAB — CBC WITH DIFFERENTIAL/PLATELET
Abs Immature Granulocytes: 0 10*3/uL (ref 0.00–0.07)
Basophils Absolute: 0 10*3/uL (ref 0.0–0.1)
Basophils Relative: 1 %
Eosinophils Absolute: 0.2 10*3/uL (ref 0.0–0.5)
Eosinophils Relative: 4 %
HEMATOCRIT: 35.7 % — AB (ref 39.0–52.0)
Hemoglobin: 11.4 g/dL — ABNORMAL LOW (ref 13.0–17.0)
Immature Granulocytes: 0 %
LYMPHS ABS: 2.1 10*3/uL (ref 0.7–4.0)
LYMPHS PCT: 38 %
MCH: 32.3 pg (ref 26.0–34.0)
MCHC: 31.9 g/dL (ref 30.0–36.0)
MCV: 101.1 fL — ABNORMAL HIGH (ref 80.0–100.0)
Monocytes Absolute: 0.4 10*3/uL (ref 0.1–1.0)
Monocytes Relative: 7 %
Neutro Abs: 2.8 10*3/uL (ref 1.7–7.7)
Neutrophils Relative %: 50 %
Platelets: 215 10*3/uL (ref 150–400)
RBC: 3.53 MIL/uL — ABNORMAL LOW (ref 4.22–5.81)
RDW: 17.1 % — ABNORMAL HIGH (ref 11.5–15.5)
WBC: 5.5 10*3/uL (ref 4.0–10.5)
nRBC: 0 % (ref 0.0–0.2)

## 2018-10-27 LAB — COMPREHENSIVE METABOLIC PANEL
ALT: 13 U/L (ref 0–44)
AST: 27 U/L (ref 15–41)
Albumin: 3 g/dL — ABNORMAL LOW (ref 3.5–5.0)
Alkaline Phosphatase: 76 U/L (ref 38–126)
Anion gap: 9 (ref 5–15)
BUN: 8 mg/dL (ref 8–23)
CO2: 22 mmol/L (ref 22–32)
Calcium: 8.6 mg/dL — ABNORMAL LOW (ref 8.9–10.3)
Chloride: 106 mmol/L (ref 98–111)
Creatinine, Ser: 0.75 mg/dL (ref 0.61–1.24)
GFR calc Af Amer: >60 mL/min (ref >60)
GFR calc non Af Amer: >60 mL/min (ref >60)
Glucose, Bld: 104 mg/dL — ABNORMAL HIGH (ref 70–99)
Potassium: 3.3 mmol/L — ABNORMAL LOW (ref 3.5–5.1)
Sodium: 137 mmol/L (ref 135–145)
Total Bilirubin: 0.4 mg/dL (ref 0.3–1.2)
Total Protein: 7 g/dL (ref 6.5–8.1)

## 2018-10-27 LAB — PROTIME-INR
INR: 1
Prothrombin Time: 13.1 seconds (ref 11.4–15.2)

## 2018-10-27 LAB — BRAIN NATRIURETIC PEPTIDE: B Natriuretic Peptide: 79 pg/mL (ref 0.0–100.0)

## 2018-10-27 LAB — INFLUENZA PANEL BY PCR (TYPE A & B)
Influenza A By PCR: NEGATIVE
Influenza B By PCR: NEGATIVE

## 2018-10-27 LAB — LIPASE, BLOOD: Lipase: 40 U/L (ref 11–51)

## 2018-10-27 LAB — TROPONIN I: Troponin I: <0.03 ng/mL (ref <0.03)

## 2018-10-27 MED ORDER — ALBUTEROL SULFATE (2.5 MG/3ML) 0.083% IN NEBU
5.0000 mg | INHALATION_SOLUTION | Freq: Once | RESPIRATORY_TRACT | Status: AC
  Administered 2018-10-27: 5 mg via RESPIRATORY_TRACT
  Filled 2018-10-27: qty 6

## 2018-10-27 MED ORDER — SODIUM CHLORIDE 0.9 % IV BOLUS
1000.0000 mL | Freq: Once | INTRAVENOUS | Status: AC
  Administered 2018-10-27: 1000 mL via INTRAVENOUS

## 2018-10-27 NOTE — ED Provider Notes (Signed)
[facility] EMERGENCY DEPARTMENT Provider Note   CSN: [id number] Arrival date & time: 10/27/18  2048     History   Chief Complaint Chief Complaint  Patient presents with  . Shortness of Breath    HPI Ronald [name] is a 62 y.o. male.  He has a history of COPD and a more recent diagnosis of lung cancer.  Sounds like he was at home earlier this month and ended up with a bronc.  She has been home for about a week and for the last 3 days he has had nasal congestion cough productive of some nonbloody sputum along with some vomiting when he coughs.  Is also noticed increased swelling in his legs.  He has not noticed a fever.  He denies any chest pain.  The history is provided by the patient.  Shortness of Breath  This is a new problem. The problem occurs intermittently.The problem has been gradually worsening. Associated symptoms include coryza, rhinorrhea, cough, sputum production, wheezing, vomiting and leg swelling. Pertinent negatives include no fever, no headaches, no sore throat, no neck pain, no hemoptysis, no chest pain, no syncope, no abdominal pain, no rash and no leg pain. It is unknown what precipitated the problem. He has tried nothing for the symptoms. The treatment provided no relief.    Past Medical History:  Diagnosis Date  . Alcoholism ([place])   . Anemia    iron deficiency  . Collapse of left lung    lower lobe  . COPD (chronic obstructive pulmonary disease) ([place])   . Family history of adverse reaction to anesthesia    sister had a bad headache  . Hypertension   . Mediastinal adenopathy     and endobronchial lesion  . Pneumonia   . TB (pulmonary tuberculosis)   . Wears dentures     Patient Active Problem List   Diagnosis Date Noted  . Mediastinal adenopathy   . Thrush, oral 09/27/2018  . Protein-calorie malnutrition, severe 09/23/2018  . Abnormal ECG 09/20/2018  . CAD (coronary artery disease) 09/20/2018  . Obstructive pneumonia 09/19/2018  . Abnormal LFTs  09/19/2018  . Hyponatremia 09/19/2018  . COPD (chronic obstructive pulmonary disease) ([place]) 09/19/2018  . Hypertension 09/19/2018  . Alcoholism ([place]) 09/19/2018  . Anemia 09/19/2018  . Lung mass 09/19/2018    Past Surgical History:  Procedure Laterality Date  . MULTIPLE TOOTH EXTRACTIONS    . VIDEO BRONCHOSCOPY WITH ENDOBRONCHIAL ULTRASOUND N/A 10/02/2018   Procedure: VIDEO BRONCHOSCOPY WITH ENDOBRONCHIAL ULTRASOUND;  Surgeon: Garner Nash, DO;  Location: [facility] OR;  Service: Thoracic;  Laterality: N/A;        Home Medications    Prior to Admission medications   Medication Sig Start Date End Date Taking? Authorizing Provider  albuterol (PROVENTIL HFA;VENTOLIN HFA) 108 (90 Base) MCG/ACT inhaler Inhale 2 puffs into the lungs every 6 (six) hours as needed for wheezing or shortness of breath. 09/24/18   Florencia Reasons, MD  albuterol (PROVENTIL) (2.5 MG/3ML) 0.083% nebulizer solution Take 3 mLs (2.5 mg total) by nebulization every 4 (four) hours as needed for wheezing or shortness of breath. 10/08/18   Garner Nash, DO  Aspirin-Salicylamide-Caffeine (BC HEADACHE PO) Take 1 packet by mouth daily as needed (pain).    [provider]  Budesonide (RHINOCORT ALLERGY NA) Place 1 spray into the nose daily as needed (allergies).    [provider]  cetirizine (ZYRTEC) 10 MG tablet Take 10 mg by mouth daily as needed for allergies.  [provider]  feeding supplement, ENSURE ENLIVE, (ENSURE ENLIVE) LIQD Take 237 mLs by mouth 2 (two) times daily between meals. 09/24/18   Florencia Reasons, MD  folic acid (FOLVITE) 1 MG tablet Take 1 tablet (1 mg total) by mouth daily. 09/25/18   Florencia Reasons, MD  Guaifenesin [facility] MAXIMUM STRENGTH) 1200 MG TB12 Take 1,200 mg by mouth daily.    [provider]  ibuprofen (ADVIL,MOTRIN) 200 MG tablet Take 400 mg by mouth daily as needed for headache or moderate pain.    [provider]  Magnesium 500 MG CAPS Take 500 mg by mouth  daily.    [provider]  nicotine (NICODERM CQ - DOSED IN MG/24 HOURS) 21 mg/24hr patch Place 21 mg onto the skin daily.    [provider]  thiamine 100 MG tablet Take 1 tablet (100 mg total) by mouth daily. 09/25/18   Florencia Reasons, MD  Tiotropium Bromide-Olodaterol (STIOLTO RESPIMAT) 2.5-2.5 MCG/ACT AERS Inhale 2 puffs into the lungs daily. 10/08/18   Garner Nash, DO    Family History Family History  Problem Relation Age of Onset  . Heart disease Mother   . Heart disease Father   . Diabetes Other   . Hypertension Other   . Ovarian cancer Maternal Grandmother     Social History Social History   Tobacco Use  . Smoking status: Former Smoker    Packs/day: 2.00    Types: Cigarettes  . Smokeless tobacco: Current User    Types: Snuff  . Tobacco comment: none since admission 08/2018  Substance Use Topics  . Alcohol use: Not Currently    Alcohol/week: 126.0 standard drinks    Types: 126 Cans of beer per week  . Drug use: No     Allergies   Patient has no known allergies.   Review of Systems Review of Systems  Constitutional: Negative for fever.  HENT: Positive for rhinorrhea. Negative for sore throat.   Eyes: Negative for visual disturbance.  Respiratory: Positive for cough, sputum production, shortness of breath and wheezing. Negative for hemoptysis.   Cardiovascular: Positive for leg swelling. Negative for chest pain and syncope.  Gastrointestinal: Positive for vomiting. Negative for abdominal pain.  Genitourinary: Negative for dysuria.  Musculoskeletal: Negative for neck pain.  Skin: Negative for rash.  Neurological: Negative for headaches.     Physical Exam Updated Vital Signs BP 117/70   Pulse (!) 115   Temp 97.7 F (36.5 C)   Resp 18   Ht 5\' 4"  (1.626 m)   Wt 61.7 kg   SpO2 100%   BMI 23.34 kg/m   Physical Exam Vitals signs and nursing note reviewed.  Constitutional:      Appearance: He is well-developed.  HENT:     Head:  Normocephalic and atraumatic.  Eyes:     Conjunctiva/sclera: Conjunctivae normal.  Neck:     Musculoskeletal: Neck supple.  Cardiovascular:     Rate and Rhythm: Normal rate and regular rhythm.     Heart sounds: No murmur.  Pulmonary:     Effort: Tachypnea and accessory muscle usage present. No respiratory distress.     Breath sounds: Wheezing (few scattered) present.  Abdominal:     Palpations: Abdomen is soft.     Tenderness: There is no abdominal tenderness.  Musculoskeletal: Normal range of motion.     Right lower leg: Edema present.     Left lower leg: Edema present.  Skin:    General: Skin is warm and dry.  Capillary Refill: Capillary refill takes less than 2 seconds.  Neurological:     General: No focal deficit present.     Mental Status: He is alert.     Motor: No weakness.      ED Treatments / Results  Labs (all labs ordered are listed, but only abnormal results are displayed) Labs Reviewed  CBC WITH DIFFERENTIAL/PLATELET - Abnormal; Notable for the following components:      Result Value   RBC 3.53 (*)    Hemoglobin 11.4 (*)    HCT 35.7 (*)    MCV 101.1 (*)    RDW 17.1 (*)    All other components within normal limits  COMPREHENSIVE METABOLIC PANEL - Abnormal; Notable for the following components:   Potassium 3.3 (*)    Glucose, Bld 104 (*)    Calcium 8.6 (*)    Albumin 3.0 (*)    All other components within normal limits  CULTURE, BLOOD (ROUTINE X 2)  CULTURE, BLOOD (ROUTINE X 2)  BRAIN NATRIURETIC PEPTIDE  TROPONIN I  LIPASE, BLOOD  PROTIME-INR  INFLUENZA PANEL BY PCR (TYPE A & B)    EKG EKG Interpretation  Date/Time:  Sunday October 27 2018 21:04:21 EST Ventricular Rate:  114 PR Interval:  176 QRS Duration: 76 QT Interval:  326 QTC Calculation: 449 R Axis:   79 Text Interpretation:  Sinus tachycardia Low voltage QRS Borderline ECG similar to prior 11/19 Confirmed by Aletta Edouard [phone]) on 10/27/2018 9:19:22 PM   Radiology Dg  Chest 2 View  Result Date: 10/27/2018 CLINICAL DATA:  Acute onset of generalized weakness, cough and vomiting. EXAM: CHEST - 2 VIEW COMPARISON:  Chest radiograph performed 09/21/2018, and PET/CT performed 10/08/2018 FINDINGS: The lungs are well-aerated. Peribronchial thickening is noted. Mild left basilar opacity may reflect pneumonia. There is no evidence of pleural effusion or pneumothorax. Bilateral nipple shadows are noted. The heart is normal in size; the mediastinal contour is within normal limits. No acute osseous abnormalities are seen. IMPRESSION: Peribronchial thickening noted. Mild left basilar opacity may reflect pneumonia. Electronically Signed   By: Garald Balding M.D.   On: 10/27/2018 21:41    Procedures Procedures (including critical care time)  Medications Ordered in ED Medications  sodium chloride 0.9 % bolus 1,000 mL (1,000 mLs Intravenous New Bag/Given 10/27/18 2304)  albuterol (PROVENTIL) (2.5 MG/3ML) 0.083% nebulizer solution 5 mg (5 mg Nebulization Given 10/27/18 2213)     Initial Impression / Assessment and Plan / ED Course  I have reviewed the triage vital signs and the nursing notes.  Pertinent labs & imaging results that were available during my care of the patient were reviewed by me and considered in my medical decision making (see chart for details).  Clinical Course as of Oct 27 2349  Sun Oct 27, 2018  2348 Patient's lab work is back and is been fairly unrevealing.  He has no white count and no fever.  Chest x-ray was read as possible infiltrate in the left base.  Initial flu testing negative.  Signed out to my partner who will reevaluate and possibly check a trending pulse ox and see if he would be feeling well enough for discharge or not.  I do not think would be unreasonable to cover with some antibiotics for this possible pneumonia as he did seem to start with some new cough and shortness of breath recently.   [MB]    Clinical Course User Index [MB]  Hayden Rasmussen, MD     Final  Clinical Impressions(s) / ED Diagnoses   Final diagnoses:  None    ED Discharge Orders    None       Hayden Rasmussen, MD 10/27/18 2350

## 2018-10-27 NOTE — ED Triage Notes (Signed)
Pt recently diagnosed with lung cancer this month. Goes jan 8th to talk about tx options.  Pt has been complaining of weakness and cough and emesis since yesterday.  Coughing up white mucus

## 2018-10-27 NOTE — ED Triage Notes (Signed)
Took albuterol ned this morning. Did help

## 2018-10-28 DIAGNOSIS — R0602 Shortness of breath: Secondary | ICD-10-CM | POA: Diagnosis not present

## 2018-10-28 MED ORDER — PREDNISONE 20 MG PO TABS
ORAL_TABLET | ORAL | Status: AC
  Administered 2018-10-28: 60 mg
  Filled 2018-10-28: qty 3

## 2018-10-28 MED ORDER — CLARITHROMYCIN 500 MG PO TABS
500.0000 mg | ORAL_TABLET | Freq: Once | ORAL | Status: AC
  Administered 2018-10-28: 500 mg via ORAL
  Filled 2018-10-28: qty 1

## 2018-10-28 MED ORDER — ALBUTEROL SULFATE HFA 108 (90 BASE) MCG/ACT IN AERS
2.0000 | INHALATION_SPRAY | RESPIRATORY_TRACT | Status: DC | PRN
  Administered 2018-10-28: 2 via RESPIRATORY_TRACT
  Filled 2018-10-28: qty 6.7

## 2018-10-28 MED ORDER — PREDNISONE 20 MG PO TABS: 40.0000 mg | ORAL_TABLET | Freq: Every day | ORAL | 0 refills | Status: DC

## 2018-10-28 MED ORDER — CLARITHROMYCIN 500 MG PO TABS: 500.0000 mg | ORAL_TABLET | Freq: Two times a day (BID) | ORAL | 0 refills | Status: DC

## 2018-10-28 MED ORDER — PREDNISONE 50 MG PO TABS: 60.0000 mg | ORAL_TABLET | Freq: Once | ORAL | Status: DC

## 2018-10-28 NOTE — ED Provider Notes (Signed)
Patient signed out to me by Dr. Melina Copa to follow progress.  Patient with history of lung cancer and COPD presented with shortness of breath.  Patient has done well here in the ER.  Oxygenation is 100% on room air.  All vitals are normal.  Blood work is entirely normal.  Chest x-ray suspicious for possible early pneumonia.  As he looks well and reports that he is at his normal baseline, he will be appropriate for outpatient management.  Initiate prednisone, Biaxin and continue albuterol.  Results for orders placed or performed during the hospital encounter of 10/27/18  Culture, blood (routine x 2)  Result Value Ref Range   Specimen Description RIGHT ANTECUBITAL    Special Requests      BOTTLES DRAWN AEROBIC AND ANAEROBIC Blood Culture adequate volume Performed at [facility], [address], [place], [place] [address]    Culture PENDING    Report Status PENDING   Culture, blood (routine x 2)  Result Value Ref Range   Specimen Description LEFT ANTECUBITAL    Special Requests      BOTTLES DRAWN AEROBIC AND ANAEROBIC Blood Culture adequate volume Performed at [facility], [address] [place],  [address]    Culture PENDING    Report Status PENDING   Brain natriuretic peptide  Result Value Ref Range   B Natriuretic Peptide 79.0 0.0 - 100.0 pg/mL  Troponin I - Once  Result Value Ref Range   Troponin I <0.03 <0.03 ng/mL  CBC with Differential  Result Value Ref Range   WBC 5.5 4.0 - 10.5 K/uL   RBC 3.53 (L) 4.22 - 5.81 MIL/uL   Hemoglobin 11.4 (L) 13.0 - 17.0 g/dL   HCT 35.7 (L) 39.0 - 52.0 %   MCV 101.1 (H) 80.0 - 100.0 fL   MCH 32.3 26.0 - 34.0 pg   MCHC 31.9 30.0 - 36.0 g/dL   RDW 17.1 (H) 11.5 - 15.5 %   Platelets 215 150 - 400 K/uL   nRBC 0.0 0.0 - 0.2 %   Neutrophils Relative % 50 %   Neutro Abs 2.8 1.7 - 7.7 K/uL   Lymphocytes Relative 38 %   Lymphs Abs 2.1 0.7 - 4.0 K/uL   Monocytes Relative 7 %   Monocytes Absolute 0.4 0.1 - 1.0 K/uL   Eosinophils Relative 4 %    Eosinophils Absolute 0.2 0.0 - 0.5 K/uL   Basophils Relative 1 %   Basophils Absolute 0.0 0.0 - 0.1 K/uL   Immature Granulocytes 0 %   Abs Immature Granulocytes 0.00 0.00 - 0.07 K/uL  Comprehensive metabolic panel  Result Value Ref Range   Sodium 137 135 - 145 mmol/L   Potassium 3.3 (L) 3.5 - 5.1 mmol/L   Chloride 106 98 - 111 mmol/L   CO2 22 22 - 32 mmol/L   Glucose, Bld 104 (H) 70 - 99 mg/dL   BUN 8 8 - 23 mg/dL   Creatinine, Ser 0.75 0.61 - 1.24 mg/dL   Calcium 8.6 (L) 8.9 - 10.3 mg/dL   Total Protein 7.0 6.5 - 8.1 g/dL   Albumin 3.0 (L) 3.5 - 5.0 g/dL   AST 27 15 - 41 U/L   ALT 13 0 - 44 U/L   Alkaline Phosphatase 76 38 - 126 U/L   Total Bilirubin 0.4 0.3 - 1.2 mg/dL   GFR calc non Af Amer >60 >60 mL/min   GFR calc Af Amer >60 >60 mL/min   Anion gap 9 5 - 15  Lipase, blood  Result Value Ref Range   Lipase 40 11 - 51 U/L  Protime-INR  Result Value Ref Range   Prothrombin Time 13.1 11.4 - 15.2 seconds   INR 1.00   Influenza panel by PCR (type A & B)  Result Value Ref Range   Influenza A By PCR NEGATIVE NEGATIVE   Influenza B By PCR NEGATIVE NEGATIVE   Dg Chest 2 View  Result Date: 10/27/2018 CLINICAL DATA:  Acute onset of generalized weakness, cough and vomiting. EXAM: CHEST - 2 VIEW COMPARISON:  Chest radiograph performed 09/21/2018, and PET/CT performed 10/08/2018 FINDINGS: The lungs are well-aerated. Peribronchial thickening is noted. Mild left basilar opacity may reflect pneumonia. There is no evidence of pleural effusion or pneumothorax. Bilateral nipple shadows are noted. The heart is normal in size; the mediastinal contour is within normal limits. No acute osseous abnormalities are seen. IMPRESSION: Peribronchial thickening noted. Mild left basilar opacity may reflect pneumonia. Electronically Signed   By: Garald Balding M.D.   On: 10/27/2018 21:41   Nm Pet Image Initial (pi) Skull Base To Thigh  Result Date: 10/08/2018 CLINICAL DATA:  Initial treatment  strategy for lung carcinoma. EXAM: NUCLEAR MEDICINE PET SKULL BASE TO THIGH TECHNIQUE: 6.3 mCi F-18 FDG was injected intravenously. Full-ring PET imaging was performed from the skull base to thigh after the radiotracer. CT data was obtained and used for attenuation correction and anatomic localization. Fasting blood glucose: 90 mg/dl COMPARISON:  Chest CT 09/19/2018 FINDINGS: Mediastinal blood pool activity: SUV max 1.9 NECK: No hypermetabolic lymph nodes in the neck. Incidental CT findings: none CHEST: LEFT infrahilar mass obstructs the LEFT lower lobe bronchus. Mass measures approximately 3.4 by 2.0 cm and has intense metabolic activity with SUV max equal 10.2. There is postobstructive collapse of the entire LEFT lower lobe. no hypermetabolic mediastinal adenopathy. Diffuse activity throughout the entire esophagus is mild-to-moderate. Small 4 mm pulmonary nodule RIGHT lower lobe (image 88/4) is unchanged from CT abdomen 09/30/2014 Incidental CT findings: none ABDOMEN/PELVIS: No abnormal hypermetabolic activity within the liver, pancreas, adrenal glands, or spleen. No hypermetabolic lymph nodes in the abdomen or pelvis. Incidental CT findings: Liver has a nodular contour. There is a moderate volume of free fluid in the abdomen pelvis. Spleen is normal volume. Atherosclerotic calcification of the aorta. SKELETON: No focal hypermetabolic activity to suggest skeletal metastasis. Incidental CT findings: none IMPRESSION: 1. Hypermetabolic LEFT infrahilar mass consistent bronchogenic carcinoma. 2. LEFT lower lobe mass obstructs the LEFT lobe bronchus. 3. No evidence of mediastinal adenopathy. 4. Diffuse esophageal activity is most consistent with esophagitis. 5. No evidence of metastatic disease.  PET-CT staging: T2 a N0 M0 6. Cirrhotic liver with ascites. Electronically Signed   By: Suzy Bouchard M.D.   On: 10/08/2018 13:09      Orpah Greek, MD 10/28/18 [phone]

## 2018-10-29 NOTE — Progress Notes (Signed)
Thoracic Location of Tumor / Histology: Non-small cell lung cancer-squamous cell carcinoma  Patient presented with symptoms of: Per Dr. Benay Spice 10/09/18:  [name] returns for a scheduled visit.  I saw him while hospitalized with pneumonia and respiratory failure last month.  He was found to have a left lung mass. He was referred to Dr. Valeta Harms and taken to a bronchoscopy on 10/02/2018.  He was found to have a left lower lobe endobronchial mass.  Brushings and biopsies were obtained.  There was complete occlusion of the left lower lobe with a mass and extrinsic compression.  Endobronchial ultrasound was used to biopsy station 7 and 4L nodes.  The left endobronchial mass was measured at 2.5 cm from the carina. 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   Biopsies revealed: 10/02/18  Diagnosis BRONCHIAL WASHING (D) LEFT #2 (SPECIMEN 3 OF 5 COLLECTED 10/02/18) MALIGNANT CELLS CONSISTENT WITH SQUAMOUS CELL CARCINOMA. Diagnosis BRONCHIAL BRUSHING (E) LEFT LOWER LOBE (SPECIMEN 4 OF 5, COLLECTED ON 10/02/2018): MALIGNANT CELLS CONSISTENT WITH SQUAMOUS CELL CARCINOMA. Diagnosis Lung, biopsy, Left lower - SQUAMOUS CELL CARCINOMA.   Tobacco/Marijuana/Snuff/ETOH use: pt reports he has quit smoking. Occasional ETOH. Denies marijuana. Pt uses smokeless tobacco. Pt denies illicit drug use.  Past/Anticipated interventions by cardiothoracic surgery, if any: 10/02/18:  Video Bronchoscopy with Endobronchial Ultrasound Procedure Note  Date of Operation: 10/02/2018  Pre-op Diagnosis: Left lower lobe endobronchial lung mass, mediastinal adenopathy  Post-op Diagnosis: Left lower lobe endobronchial lung mass, mediastinal adenopathy  Surgeon: Garner Nash, DO   Per Dr. Roxan Hockey 10/11/18:  Reviewing the CT and PET films and the pictures from bronchoscopy, I think it would take a pneumonectomy to achieve a  complete resection.  At best he is a borderline candidate, and would need some additional assessment of his pulmonary reserve.  I discussed this with Mr. Aden and his sister and they were both reluctant to consider that procedure.  He would prefer to be treated with chemo and radiation.  Where he did have a response to chemo and radiation to the point where he might be a candidate for a lobectomy, we could reconsider surgery at that time.  It would pretty much preclude any possibility of doing a pneumonectomy.  Tobacco abuse-he has not smoked since he was in the hospital almost a month ago.  I congratulated him on that and emphasized the importance of continued abstinence.  Ethanol abuse-also has not had anything to drink since he was in the hospital.  I emphasized the importance of that as well.  I did discuss with them that he does have evidence of cirrhosis and ascites on his PET/CT.  Plan: Follow-up with Dr. Benay Spice for consideration for radiation and chemotherapy.  If he has a significant response we could consider lobectomy.  Past/Anticipated interventions by medical oncology, if any: Per Dr. Benay Spice 11/05/18:  Assessment/Plan: 1.  Non-small cell lung cancer-squamous cell carcinoma  CT chest 09/19/2018 concerning for an obstructing left lower lobe mass, small left pleural effusion, 4 mm right lower lobe nodule  CT head 09/20/2018- no acute abnormality  Bronchoscopy 10/02/2018- complete occlusion of the left lower lobe with an obstructing mass, biopsies, washing, and brushing reveal squamous cell carcinoma, biopsies of level 7 and 4L nodes-negative  PET scan 10/08/2018-hypermetabolic left hilar mass obstructing the left lower lobe bronchus with left lower lobe collapse, no evidence of metastatic lymphadenopathy or distant metastases  Clinical stage Ib (T2N0)  2.Tobacco and alcohol use 3.COPD 4.Weight loss 5.Anemia 6.History of tuberculosis 7. Changes of cirrhosis on  the PET scan 10/08/2018    Disposition: Mr. Kruse has been diagnosed with squamous cell carcinoma involving a left lower lobe mass.  He appears to have disease localized to the mass, clinical stage Ib.  He has decided against a left pneumonectomy.  I recommend treatment with concurrent chemotherapy and radiation.  He is scheduled to see Dr. Sondra Come tomorrow.  I recommend treatment with concurrent weekly Taxol/carboplatin and radiation.  We reviewed the potential toxicities associated with the Taxol/carboplatin regimen.  He understands the chance for nausea/vomiting, alopecia, and hematologic toxicity.  We discussed the potential for an allergic reaction.  We discussed the neuropathy and bone pain associated with Taxol.  He will attend a chemotherapy teaching class.  He agrees to proceed.  I anticipate radiation starting during the week of 11/18/2018.  He will be scheduled for an office visit and the first treatment with weekly Taxol/carboplatin on 11/22/2018.  Signs/Symptoms  Weight changes, if any: 30 pound weight loss in the last 2 months  Respiratory complaints, if any: He continues to have dyspnea.  He uses a nebulizer and inhaler  Hemoptysis, if any: Pt has cough with white sputum, no hemoptysis  Pain issues, if any:  Pt denies c/o pain.  SAFETY ISSUES:  Prior radiation? No  Pacemaker/ICD? No   Possible current pregnancy? N/A, pt is male  Is the patient on methotrexate? No  Current Complaints / other details:  Pt presents today for initial consult with Dr. Sondra Come for Radiation Oncology. Pt is accompanied by sister.   BP 120/68 (BP Location: Left Arm, Patient Position: Sitting)   Pulse 99   Temp 98.1 F (36.7 C) (Oral)   Resp 18   Ht 5\' 4"  (1.626 m)   Wt 132 lb 4 oz (60 kg)   SpO2 99%   BMI 22.70 kg/m   Wt Readings from Last 3 Encounters:  11/06/18 132 lb 4 oz (60 kg)  11/05/18 131 lb (59.4 kg)  10/27/18 136 lb (61.7 kg)   Loma Sousa, RN BSN

## 2018-11-01 LAB — CULTURE, BLOOD (ROUTINE X 2)
Culture: NO GROWTH
Culture: NO GROWTH
Special Requests: ADEQUATE
Special Requests: ADEQUATE

## 2018-11-05 ENCOUNTER — Other Ambulatory Visit: Payer: Self-pay | Admitting: *Deleted

## 2018-11-05 ENCOUNTER — Inpatient Hospital Stay: Payer: Medicare Other | Attending: Oncology | Admitting: Oncology

## 2018-11-05 VITALS — BP 120/62 | HR 116 | Temp 97.5°F | Resp 19 | Ht 64.0 in | Wt 131.0 lb

## 2018-11-05 DIAGNOSIS — J449 Chronic obstructive pulmonary disease, unspecified: Secondary | ICD-10-CM | POA: Diagnosis not present

## 2018-11-05 DIAGNOSIS — K7460 Unspecified cirrhosis of liver: Secondary | ICD-10-CM

## 2018-11-05 DIAGNOSIS — R197 Diarrhea, unspecified: Secondary | ICD-10-CM | POA: Insufficient documentation

## 2018-11-05 DIAGNOSIS — Z5111 Encounter for antineoplastic chemotherapy: Secondary | ICD-10-CM | POA: Diagnosis present

## 2018-11-05 DIAGNOSIS — D649 Anemia, unspecified: Secondary | ICD-10-CM | POA: Insufficient documentation

## 2018-11-05 DIAGNOSIS — C3432 Malignant neoplasm of lower lobe, left bronchus or lung: Secondary | ICD-10-CM | POA: Diagnosis present

## 2018-11-05 DIAGNOSIS — C3490 Malignant neoplasm of unspecified part of unspecified bronchus or lung: Secondary | ICD-10-CM | POA: Insufficient documentation

## 2018-11-05 MED ORDER — PROCHLORPERAZINE MALEATE 10 MG PO TABS: 10.0000 mg | ORAL_TABLET | Freq: Four times a day (QID) | ORAL | 1 refills | Status: DC | PRN

## 2018-11-05 MED ORDER — DEXAMETHASONE 2 MG PO TABS: ORAL_TABLET | ORAL | 0 refills | Status: DC

## 2018-11-05 NOTE — Progress Notes (Signed)
Non-Small Cell Lung - No Medical Intervention - Off Treatment.  Patient Characteristics: Stage IB - Unresectable AJCC T Category: Staged < 8th Ed. Current Disease Status: No Distant Mets or Local Recurrence AJCC N Category: Staged < 8th Ed. AJCC M Category: Staged < 8th Ed. AJCC 8 Stage Grouping: Staged < 8th Ed.

## 2018-11-05 NOTE — Progress Notes (Signed)
MD requested Compazine script and Decadron premed script be sent for patient.

## 2018-11-05 NOTE — Progress Notes (Signed)
[place] OFFICE PROGRESS NOTE   Diagnosis: Non-small cell lung cancer  INTERVAL HISTORY:   [name] saw Dr. Roxan Hockey to consider resection of the left lung tumor.  He was felt to be high risk for a left pneumonectomy.  After discussion with Dr. Roxan Hockey a decision was made to forego surgery.  He is referred to consider chemotherapy/radiation.  He may be a candidate for a lobectomy in the future.  Mr. Kernodle remains off of cigarettes.  He drinks alcohol occasionally.  He is here today with his sister.  He was seen in the emergency room on 10/27/2018 with a cough and dyspnea.  A chest x-ray revealed a question of pneumonia.  He is completing a course of clarithromycin and prednisone.  He continues to have dyspnea.  He uses a nebulizer and inhaler.  Objective:  Vital signs in last 24 hours:  Blood pressure 120/62, pulse (!) 116, temperature (!) 97.5 F (36.4 C), temperature source Oral, resp. rate 19, height 5\' 4"  (1.626 m), weight 131 lb (59.4 kg), SpO2 100 %.    HEENT: Neck without mass Lymphatics: No cervical or supraclavicular nodes Resp: Lungs with distant breath sounds, no respiratory distress decreased breath sounds at the left lower posterior chest Cardio: Regular rate and rhythm, tachycardia GI: No hepatosplenomegaly, no mass, nontender Vascular: No leg edema   Lab Results:  Lab Results  Component Value Date   WBC 5.5 10/27/2018   HGB 11.4 (L) 10/27/2018   HCT 35.7 (L) 10/27/2018   MCV 101.1 (H) 10/27/2018   PLT 215 10/27/2018   NEUTROABS 2.8 10/27/2018    CMP  Lab Results  Component Value Date   NA 137 10/27/2018   K 3.3 (L) 10/27/2018   CL 106 10/27/2018   CO2 22 10/27/2018   GLUCOSE 104 (H) 10/27/2018   BUN 8 10/27/2018   CREATININE 0.75 10/27/2018   CALCIUM 8.6 (L) 10/27/2018   PROT 7.0 10/27/2018   ALBUMIN 3.0 (L) 10/27/2018   AST 27 10/27/2018   ALT 13 10/27/2018   ALKPHOS 76 10/27/2018   BILITOT 0.4 10/27/2018   GFRNONAA  >60 10/27/2018   GFRAA >60 10/27/2018    Medications: I have reviewed the patient's current medications.   Assessment/Plan:   1.  Non-small cell lung cancer-squamous cell carcinoma  CT chest 09/19/2018 concerning for an obstructing left lower lobe mass, small left pleural effusion, 4 mm right lower lobe nodule  CT head 09/20/2018- no acute abnormality  Bronchoscopy 10/02/2018- complete occlusion of the left lower lobe with an obstructing mass, biopsies, washing, and brushing reveal squamous cell carcinoma, biopsies of level 7 and 4L nodes-negative  PET scan 10/08/2018-hypermetabolic left hilar mass obstructing the left lower lobe bronchus with left lower lobe collapse, no evidence of metastatic lymphadenopathy or distant metastases  Clinical stage Ib (T2N0) 2.  Tobacco and alcohol use 3.  COPD 4.  Weight loss 5.  Anemia 6.  History of tuberculosis 7.  Changes of cirrhosis on the PET scan 10/08/2018    Disposition: Mr. Carino has been diagnosed with squamous cell carcinoma involving a left lower lobe mass.  He appears to have disease localized to the mass, clinical stage Ib.  He has decided against a left pneumonectomy.  I recommend treatment with concurrent chemotherapy and radiation.  He is scheduled to see Dr. Sondra Come tomorrow.  I recommend treatment with concurrent weekly Taxol/carboplatin and radiation.  We reviewed the potential toxicities associated with the Taxol/carboplatin regimen.  He understands the chance for  nausea/vomiting, alopecia, and hematologic toxicity.  We discussed the potential for an allergic reaction.  We discussed the neuropathy and bone pain associated with Taxol.  He will attend a chemotherapy teaching class.  He agrees to proceed.  I anticipate radiation starting during the week of 11/18/2018.  He will be scheduled for an office visit and the first treatment with weekly Taxol/carboplatin on 11/22/2018.   Betsy Coder, MD  11/05/2018  8:44 AM

## 2018-11-05 NOTE — Progress Notes (Signed)
DISCONTINUE ON PATHWAY REGIMEN - Non-Small Cell Lung  No Medical Intervention - Off Treatment.  REASON: Other Reason PRIOR TREATMENT: Lung285: Referral to Radiation Oncology  START OFF PATHWAY REGIMEN - Non-Small Cell Lung   [id number]:Carboplatin + Paclitaxel (2/50) + RT weekly x 6 weeks:   Administer weekly during RT:     Paclitaxel      Carboplatin   **Always confirm dose/schedule in your pharmacy ordering system**  Patient Characteristics: Stage IB - Unresectable AJCC T Category: Staged < 8th Ed. Current Disease Status: No Distant Mets or Local Recurrence AJCC N Category: Staged < 8th Ed. AJCC M Category: Staged < 8th Ed. AJCC 8 Stage Grouping: Staged < 8th Ed. Intent of Therapy: Curative Intent, Discussed with Patient

## 2018-11-06 ENCOUNTER — Telehealth: Payer: Self-pay | Admitting: Oncology

## 2018-11-06 ENCOUNTER — Encounter: Payer: Self-pay | Admitting: Radiation Oncology

## 2018-11-06 ENCOUNTER — Other Ambulatory Visit: Payer: Self-pay

## 2018-11-06 ENCOUNTER — Ambulatory Visit
Admission: RE | Admit: 2018-11-06 | Discharge: 2018-11-06 | Disposition: A | Discharge status: Home / Self Care | Payer: Medicare Other | Source: Ambulatory Visit | Attending: Radiation Oncology | Admitting: Radiation Oncology

## 2018-11-06 VITALS — BP 120/68 | HR 99 | Temp 98.1°F | Resp 18 | Ht 64.0 in | Wt 132.2 lb

## 2018-11-06 DIAGNOSIS — Z9981 Dependence on supplemental oxygen: Secondary | ICD-10-CM | POA: Diagnosis not present

## 2018-11-06 DIAGNOSIS — Z87891 Personal history of nicotine dependence: Secondary | ICD-10-CM | POA: Insufficient documentation

## 2018-11-06 DIAGNOSIS — C3432 Malignant neoplasm of lower lobe, left bronchus or lung: Secondary | ICD-10-CM

## 2018-11-06 DIAGNOSIS — Z510 Encounter for antineoplastic radiation therapy: Secondary | ICD-10-CM | POA: Insufficient documentation

## 2018-11-06 DIAGNOSIS — C3492 Malignant neoplasm of unspecified part of left bronchus or lung: Secondary | ICD-10-CM | POA: Insufficient documentation

## 2018-11-06 NOTE — Progress Notes (Signed)
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 male who is presenting to the office today for evaluation of lung cancer. He is accompanied by his sister. He initially presented to the hospital for breathing problems. He was on oxygen at home upon discharge but stopped due to headaches. He has a history of smoking a pack and a half a day for 40-50 years but has quit since his diagnosis. 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  He underwent a chest X-ray November 21 after reporting that his feet gave out on him and he fell. This showed left lower lobe collapse and potential central obstruction and infection.   On the same day, he received a chest CT with contrast which revealed findings concerning for a centrally obstructing mass/malignancy resulting in collapse of the left lower lobe. While a discrete mass could not be measured, there was soft tissue fullness in the left infrahilar region. There was also abrupt cut off of the left lower lobe bronchus and narrowing of the left lower lobe pulmonary artery as it traverses this region. Atherosclerotic change was noted in the thoracic aorta. Small left-sided pleural effusion. A 4 mm nodule was found in the right lower lobe, too small to characterize.   He had another X-ray on 11/23/ which showed no change in the complete atelectasis involving the left lower lobe. Small left pleural effusion increasing in size.   He had a bronchial washing procedure on 12/4 which revealed malignant cells consistent with squamous cell carcinoma. On the  same day he underwent a biopsy of the left lower lung and pathology revealed the cells to containing squamous cell carcinoma.    He had a PET scan performed on 12/10 which showed Hypermetabolic LEFT infrahilar mass consistent bronchogenic Carcinoma. 2. LEFT lower lobe mass obstructs the LEFT lobe bronchus. No evidence of metastatic disease.  His most recent X-ray occurred on 12/29 and showed peribronchial thickening. Mild left basilar opacity possibly reflected pneumonia.   The patient did meet with Dr. Modesto Charon and the patient was not felt to be a candidate for surgery as this would entail a pneumonectomy.  he reports associated weight loss of approx. 13-30 lbs, headaches, coughing and coughing up blood one time. he denies appetite loss, pain in his back or chest and any other symptoms.    PREVIOUS RADIATION THERAPY: No  PAST MEDICAL HISTORY:  has a past medical history of Alcoholism ([place]), Anemia, Collapse of left lung, COPD (chronic obstructive pulmonary disease) ([place]), Family history of adverse reaction to anesthesia, Hypertension, Mediastinal adenopathy, Pneumonia, TB (pulmonary tuberculosis), and Wears dentures.    PAST SURGICAL HISTORY: Past Surgical History:  Procedure Laterality Date  . MULTIPLE TOOTH EXTRACTIONS    . VIDEO BRONCHOSCOPY WITH ENDOBRONCHIAL ULTRASOUND N/A 10/02/2018   Procedure: VIDEO BRONCHOSCOPY WITH ENDOBRONCHIAL ULTRASOUND;  Surgeon: Garner Nash, DO;  Location: [facility] OR;  Service: Thoracic;  Laterality: N/A;    FAMILY HISTORY: family history includes Diabetes in an other family member; Heart disease in his father and mother; Hypertension in an other family member;  Ovarian cancer in his maternal grandmother.  SOCIAL HISTORY:  reports that he has quit smoking. His smoking use included cigarettes. He smoked 2.00 packs per day. His smokeless tobacco use includes snuff. He reports previous alcohol use of about 126.0 standard drinks of alcohol per week. He  reports that he does not use drugs.  ALLERGIES: Patient has no known allergies.  MEDICATIONS:  Current Outpatient Medications  Medication Sig Dispense Refill  . albuterol (PROVENTIL HFA;VENTOLIN HFA) 108 (90 Base) MCG/ACT inhaler Inhale 2 puffs into the lungs every 6 (six) hours as needed for wheezing or shortness of breath. 1 Inhaler 2  . albuterol (PROVENTIL) (2.5 MG/3ML) 0.083% nebulizer solution Take 3 mLs (2.5 mg total) by nebulization every 4 (four) hours as needed for wheezing or shortness of breath. 75 mL 5  . Aspirin-Salicylamide-Caffeine (BC HEADACHE PO) Take 1 packet by mouth daily as needed (pain).    . Budesonide (RHINOCORT ALLERGY NA) Place 1 spray into the nose daily as needed (allergies).    . cetirizine (ZYRTEC) 10 MG tablet Take 10 mg by mouth daily as needed for allergies.    . clarithromycin (BIAXIN) 500 MG tablet Take 1 tablet (500 mg total) by mouth 2 (two) times daily. 20 tablet 0  . dexamethasone (DECADRON) 2 MG tablet Take #5 (10mg ) night before 1st chemo and at 6 am day of 1st chemo 10 tablet 0  . feeding supplement, ENSURE ENLIVE, (ENSURE ENLIVE) LIQD Take 237 mLs by mouth 2 (two) times daily between meals. 101 mL 12  . folic acid (FOLVITE) 1 MG tablet Take 1 tablet (1 mg total) by mouth daily. 30 tablet 0  . Guaifenesin (MUCINEX MAXIMUM STRENGTH) 1200 MG TB12 Take 1,200 mg by mouth daily.    Marland Kitchen ibuprofen (ADVIL,MOTRIN) 200 MG tablet Take 400 mg by mouth daily as needed for headache or moderate pain.    . Magnesium 500 MG CAPS Take 500 mg by mouth daily.    . nicotine (NICODERM CQ - DOSED IN MG/24 HOURS) 21 mg/24hr patch Place 21 mg onto the skin daily.    . predniSONE (DELTASONE) 20 MG tablet Take 2 tablets (40 mg total) by mouth daily with breakfast. 10 tablet 0  . prochlorperazine (COMPAZINE) 10 MG tablet Take 1 tablet (10 mg total) by mouth every 6 (six) hours as needed for nausea. 60 tablet 1  . thiamine 100 MG tablet Take 1 tablet (100 mg total) by mouth daily.  30 tablet 0  . Tiotropium Bromide-Olodaterol (STIOLTO RESPIMAT) 2.5-2.5 MCG/ACT AERS Inhale 2 puffs into the lungs daily. 1 Inhaler 0   No current facility-administered medications for this encounter.     REVIEW OF SYSTEMS:  A 10+ POINT REVIEW OF SYSTEMS WAS OBTAINED including neurology, dermatology, psychiatry, cardiac, respiratory, lymph, extremities, GI, GU, musculoskeletal, constitutional, reproductive, HEENT. All pertinent positives are noted in the HPI. All others are negative.    PHYSICAL EXAM:  height is 5\' 4"  (1.626 m) and weight is 132 lb 4 oz (60 kg). His oral temperature is 98.1 F (36.7 C). His blood pressure is 120/68 and his pulse is 99. His respiration is 18 and oxygen saturation is 99%.   General: Alert and oriented, in no acute distress HEENT: Head is normocephalic. Extraocular movements are intact. Oropharynx is clear. Oral cavity shows poor dentition and several teeth are missing. Neck: Neck is supple, no palpable cervical or supraclavicular lymphadenopathy. Heart: Regular in rate and rhythm with no murmurs, rubs, or gallops. Chest: Clear to auscultation bilaterally, with  no rhonchi, wheezes, or rales. Decreased breath sounds in the left lower lung area.  Abdomen: Soft, nontender, nondistended, with no rigidity or guarding. Extremities: No cyanosis or edema. Lymphatics: see Neck Exam Skin: No concerning lesions. Musculoskeletal: symmetric strength and muscle tone throughout. Neurologic: Cranial nerves II through XII are grossly intact. No obvious focalities. Speech is fluent. Coordination is intact. Psychiatric: Judgment and insight are intact. Affect is appropriate.   ECOG = 1  0 - Asymptomatic (Fully active, able to carry on all predisease activities without restriction)  1 - Symptomatic but completely ambulatory (Restricted in physically strenuous activity but ambulatory and able to carry out work of a light or sedentary nature. For example, light housework, office  work)  2 - Symptomatic, <50% in bed during the day (Ambulatory and capable of all self care but unable to carry out any work activities. Up and about more than 50% of waking hours)  3 - Symptomatic, >50% in bed, but not bedbound (Capable of only limited self-care, confined to bed or chair 50% or more of waking hours)  4 - Bedbound (Completely disabled. Cannot carry on any self-care. Totally confined to bed or chair)  5 - Death   Eustace Pen MM, Creech RH, Tormey DC, et al. [phone]). "Toxicity and response criteria of the [facility]". [place] Oncol. 5 (6): 649-55  LABORATORY DATA:  Lab Results  Component Value Date   WBC 5.5 10/27/2018   HGB 11.4 (L) 10/27/2018   HCT 35.7 (L) 10/27/2018   MCV 101.1 (H) 10/27/2018   PLT 215 10/27/2018   NEUTROABS 2.8 10/27/2018   Lab Results  Component Value Date   NA 137 10/27/2018   K 3.3 (L) 10/27/2018   CL 106 10/27/2018   CO2 22 10/27/2018   GLUCOSE 104 (H) 10/27/2018   CREATININE 0.75 10/27/2018   CALCIUM 8.6 (L) 10/27/2018      RADIOGRAPHY: Dg Chest 2 View  Result Date: 10/27/2018 CLINICAL DATA:  Acute onset of generalized weakness, cough and vomiting. EXAM: CHEST - 2 VIEW COMPARISON:  Chest radiograph performed 09/21/2018, and PET/CT performed 10/08/2018 FINDINGS: The lungs are well-aerated. Peribronchial thickening is noted. Mild left basilar opacity may reflect pneumonia. There is no evidence of pleural effusion or pneumothorax. Bilateral nipple shadows are noted. The heart is normal in size; the mediastinal contour is within normal limits. No acute osseous abnormalities are seen. IMPRESSION: Peribronchial thickening noted. Mild left basilar opacity may reflect pneumonia. Electronically Signed   By: Garald Balding M.D.   On: 10/27/2018 21:41   Nm Pet Image Initial (pi) Skull Base To Thigh  Result Date: 10/08/2018 CLINICAL DATA:  Initial treatment strategy for lung carcinoma. EXAM: NUCLEAR MEDICINE PET SKULL BASE  TO THIGH TECHNIQUE: 6.3 mCi F-18 FDG was injected intravenously. Full-ring PET imaging was performed from the skull base to thigh after the radiotracer. CT data was obtained and used for attenuation correction and anatomic localization. Fasting blood glucose: 90 mg/dl COMPARISON:  Chest CT 09/19/2018 FINDINGS: Mediastinal blood pool activity: SUV max 1.9 NECK: No hypermetabolic lymph nodes in the neck. Incidental CT findings: none CHEST: LEFT infrahilar mass obstructs the LEFT lower lobe bronchus. Mass measures approximately 3.4 by 2.0 cm and has intense metabolic activity with SUV max equal 10.2. There is postobstructive collapse of the entire LEFT lower lobe. no hypermetabolic mediastinal adenopathy. Diffuse activity throughout the entire esophagus is mild-to-moderate. Small 4 mm pulmonary nodule RIGHT lower lobe (image 88/4) is unchanged from CT abdomen 09/30/2014 Incidental CT  findings: none ABDOMEN/PELVIS: No abnormal hypermetabolic activity within the liver, pancreas, adrenal glands, or spleen. No hypermetabolic lymph nodes in the abdomen or pelvis. Incidental CT findings: Liver has a nodular contour. There is a moderate volume of free fluid in the abdomen pelvis. Spleen is normal volume. Atherosclerotic calcification of the aorta. SKELETON: No focal hypermetabolic activity to suggest skeletal metastasis. Incidental CT findings: none IMPRESSION: 1. Hypermetabolic LEFT infrahilar mass consistent bronchogenic carcinoma. 2. LEFT lower lobe mass obstructs the LEFT lobe bronchus. 3. No evidence of mediastinal adenopathy. 4. Diffuse esophageal activity is most consistent with esophagitis. 5. No evidence of metastatic disease.  PET-CT staging: T2 a N0 M0 6. Cirrhotic liver with ascites. Electronically Signed   By: Suzy Bouchard M.D.   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    Today, I talked to the patient and one of his sisters about the findings and work-up thus far.  We discussed the natural history of squamous cell carcinoma of the lung and general treatment, highlighting the role of radiotherapy in the management.  We discussed the available radiation techniques, and focused on the details of logistics and delivery.  We reviewed the anticipated acute and late sequelae associated with radiation in this setting.  The patient was encouraged to ask questions that I answered to the best of my ability.  A patient consent form was discussed and signed.  We retained a copy for our records.  The patient would like to proceed with radiation and will be scheduled for CT simulation.   PLAN: CT simulation January 13 at  11AM with treatments to begin Jan 20 or 21 concomitant with radiosensitizing chemotherapy. Anticipate 6 weeks of radiation therapy.     ------------------------------------------------  Blair Promise, PhD, MD   This document serves as a record of services personally performed by Gery Pray, MD. It was created on his behalf by Mary-Margaret Loma Messing, a trained medical scribe. The creation of this record is based on the scribe's personal observations and the provider's statements to them. This document has been checked and approved by the attending provider.

## 2018-11-06 NOTE — Telephone Encounter (Signed)
Patient came in and got a print out of the appointment calendar.

## 2018-11-06 NOTE — Telephone Encounter (Signed)
Scheduled appt per 1/7 los - pt sister is aware of appts schedueld and will go to scheduling today to pick up a schedule after Radiation appt .

## 2018-11-11 ENCOUNTER — Ambulatory Visit
Admission: RE | Admit: 2018-11-11 | Discharge: 2018-11-11 | Disposition: A | Discharge status: Home / Self Care | Payer: Medicare Other | Source: Ambulatory Visit | Attending: Radiation Oncology | Admitting: Radiation Oncology

## 2018-11-11 DIAGNOSIS — Z510 Encounter for antineoplastic radiation therapy: Secondary | ICD-10-CM | POA: Diagnosis not present

## 2018-11-11 DIAGNOSIS — C3432 Malignant neoplasm of lower lobe, left bronchus or lung: Secondary | ICD-10-CM

## 2018-11-11 NOTE — Progress Notes (Signed)
  Radiation Oncology         [phone]) [phone] ________________________________  Name: Ronald [name] MRN: [id number]  Date: 11/11/2018  DOB: May 09, 1956  SIMULATION AND TREATMENT PLANNING NOTE    ICD-10-CM   1. 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  Identity was confirmed.  All relevant records and images related to the planned course of therapy were reviewed.  The patient freely provided informed written consent to proceed with treatment after reviewing the details related to the planned course of therapy. The consent form was witnessed and verified by the simulation staff.  Then, the patient was set-up in a stable reproducible  supine position for radiation therapy.  CT images were obtained.  Surface markings were placed.  The CT images were loaded into the planning software.  Then the target and avoidance structures were contoured.  Treatment planning then occurred.  The radiation prescription was entered and confirmed.  Then, I designed and supervised the construction of a total of 6 medically necessary complex treatment devices.  I have requested : 3D Simulation  I have requested a DVH of the following structures: GTV, PTV, heart, lungs, spinal cord, esophagus.  I have ordered:dose calc.  PLAN:  The patient will receive 60 Gy in 30 fractions along with radiosensitizing chemotherapy.  Special Treatment Procedure Note: The patient will be receiving radiosensitizing chemotherapy. Given the potential of increased toxicities related to combined therapy and the necessity for close monitoring of the patient and blood work, this constitutes a special treatment procedure.  -----------------------------------  Blair Promise, PhD, MD This document serves as a record of services personally performed by Gery Pray, MD. It was  created on his behalf by Mary-Margaret Loma Messing, a trained medical scribe. The creation of this record is based on the scribe's personal observations and the provider's statements to them. This document has been checked and approved by the attending provider.

## 2018-11-12 ENCOUNTER — Telehealth: Payer: Self-pay | Admitting: Pulmonary Disease

## 2018-11-12 MED ORDER — TIOTROPIUM BROMIDE-OLODATEROL 2.5-2.5 MCG/ACT IN AERS: 2.0000 | INHALATION_SPRAY | Freq: Every day | RESPIRATORY_TRACT | 5 refills | Status: DC

## 2018-11-12 NOTE — Telephone Encounter (Signed)
Patient's sister returned call, CB is [phone]

## 2018-11-12 NOTE — Addendum Note (Signed)
Addended by: Lorretta Harp on: 11/12/2018 04:18 PM   Modules accepted: Orders

## 2018-11-12 NOTE — Telephone Encounter (Signed)
Called patients sister at number given (ok per DPR) unable to reach. Left message to give [place] a call back. Called patient at number given, unable to reach and unable to leave message due to it saying call cannot be completed.

## 2018-11-12 NOTE — Telephone Encounter (Signed)
Called and spoke with pt's sister [name] letting her know that I was going to send in the Rx of stiolto to preferred pharmacy for pt. Betsy expressed understanding. Nothing further needed.

## 2018-11-14 DIAGNOSIS — Z510 Encounter for antineoplastic radiation therapy: Secondary | ICD-10-CM | POA: Diagnosis not present

## 2018-11-15 ENCOUNTER — Inpatient Hospital Stay: Payer: Medicare Other

## 2018-11-15 DIAGNOSIS — Z5111 Encounter for antineoplastic chemotherapy: Secondary | ICD-10-CM | POA: Diagnosis not present

## 2018-11-15 DIAGNOSIS — C3490 Malignant neoplasm of unspecified part of unspecified bronchus or lung: Secondary | ICD-10-CM

## 2018-11-15 LAB — CMP (CANCER CENTER ONLY)
ALT: 15 U/L (ref 0–44)
AST: 25 U/L (ref 15–41)
Albumin: 3.5 g/dL (ref 3.5–5.0)
Alkaline Phosphatase: 88 U/L (ref 38–126)
Anion gap: 8 (ref 5–15)
BUN: 11 mg/dL (ref 8–23)
CO2: 26 mmol/L (ref 22–32)
Calcium: 9.2 mg/dL (ref 8.9–10.3)
Chloride: 104 mmol/L (ref 98–111)
Creatinine: 0.85 mg/dL (ref 0.61–1.24)
GFR, Est AFR Am: >60 mL/min (ref >60)
GFR, Estimated: >60 mL/min (ref >60)
Glucose, Bld: 131 mg/dL — ABNORMAL HIGH (ref 70–99)
Potassium: 4.7 mmol/L (ref 3.5–5.1)
Sodium: 138 mmol/L (ref 135–145)
Total Bilirubin: 0.2 mg/dL — ABNORMAL LOW (ref 0.3–1.2)
Total Protein: 7.4 g/dL (ref 6.5–8.1)

## 2018-11-15 LAB — CBC WITH DIFFERENTIAL (CANCER CENTER ONLY)
Abs Immature Granulocytes: 0.01 10*3/uL (ref 0.00–0.07)
Basophils Absolute: 0 10*3/uL (ref 0.0–0.1)
Basophils Relative: 1 %
Eosinophils Absolute: 0.1 10*3/uL (ref 0.0–0.5)
Eosinophils Relative: 1 %
HEMATOCRIT: 41.9 % (ref 39.0–52.0)
Hemoglobin: 14.1 g/dL (ref 13.0–17.0)
Immature Granulocytes: 0 %
Lymphocytes Relative: 15 %
Lymphs Abs: 0.9 10*3/uL (ref 0.7–4.0)
MCH: 34.6 pg — ABNORMAL HIGH (ref 26.0–34.0)
MCHC: 33.7 g/dL (ref 30.0–36.0)
MCV: 102.9 fL — ABNORMAL HIGH (ref 80.0–100.0)
Monocytes Absolute: 0.8 10*3/uL (ref 0.1–1.0)
Monocytes Relative: 14 %
NEUTROS ABS: 4 10*3/uL (ref 1.7–7.7)
Neutrophils Relative %: 69 %
Platelet Count: 116 10*3/uL — ABNORMAL LOW (ref 150–400)
RBC: 4.07 MIL/uL — ABNORMAL LOW (ref 4.22–5.81)
RDW: 15.5 % (ref 11.5–15.5)
WBC Count: 5.8 10*3/uL (ref 4.0–10.5)
nRBC: 0 % (ref 0.0–0.2)

## 2018-11-16 ENCOUNTER — Other Ambulatory Visit: Payer: Self-pay | Admitting: Oncology

## 2018-11-18 ENCOUNTER — Ambulatory Visit: Payer: Medicare Other | Admitting: Radiation Oncology

## 2018-11-19 ENCOUNTER — Ambulatory Visit
Admission: RE | Admit: 2018-11-19 | Discharge: 2018-11-19 | Disposition: A | Discharge status: Home / Self Care | Payer: Medicare Other | Source: Ambulatory Visit | Attending: Radiation Oncology | Admitting: Radiation Oncology

## 2018-11-19 DIAGNOSIS — Z510 Encounter for antineoplastic radiation therapy: Secondary | ICD-10-CM | POA: Diagnosis not present

## 2018-11-20 ENCOUNTER — Ambulatory Visit
Admission: RE | Admit: 2018-11-20 | Discharge: 2018-11-20 | Disposition: A | Discharge status: Home / Self Care | Payer: Medicare Other | Source: Ambulatory Visit | Attending: Radiation Oncology | Admitting: Radiation Oncology

## 2018-11-20 DIAGNOSIS — Z510 Encounter for antineoplastic radiation therapy: Secondary | ICD-10-CM | POA: Diagnosis not present

## 2018-11-21 ENCOUNTER — Ambulatory Visit
Admission: RE | Admit: 2018-11-21 | Discharge: 2018-11-21 | Disposition: A | Discharge status: Home / Self Care | Payer: Medicare Other | Source: Ambulatory Visit | Attending: Radiation Oncology | Admitting: Radiation Oncology

## 2018-11-21 ENCOUNTER — Other Ambulatory Visit: Payer: Self-pay | Admitting: *Deleted

## 2018-11-21 DIAGNOSIS — C3490 Malignant neoplasm of unspecified part of unspecified bronchus or lung: Secondary | ICD-10-CM

## 2018-11-21 DIAGNOSIS — Z510 Encounter for antineoplastic radiation therapy: Secondary | ICD-10-CM | POA: Diagnosis not present

## 2018-11-22 ENCOUNTER — Inpatient Hospital Stay: Payer: Medicare Other

## 2018-11-22 ENCOUNTER — Ambulatory Visit
Admission: RE | Admit: 2018-11-22 | Discharge: 2018-11-22 | Disposition: A | Discharge status: Home / Self Care | Payer: Medicare Other | Source: Ambulatory Visit | Attending: Radiation Oncology | Admitting: Radiation Oncology

## 2018-11-22 ENCOUNTER — Encounter: Payer: Self-pay | Admitting: Oncology

## 2018-11-22 ENCOUNTER — Telehealth: Payer: Self-pay | Admitting: *Deleted

## 2018-11-22 VITALS — BP 118/73 | HR 71 | Temp 98.5°F | Resp 17 | Wt 133.0 lb

## 2018-11-22 DIAGNOSIS — Z5111 Encounter for antineoplastic chemotherapy: Secondary | ICD-10-CM | POA: Diagnosis not present

## 2018-11-22 DIAGNOSIS — Z510 Encounter for antineoplastic radiation therapy: Secondary | ICD-10-CM | POA: Diagnosis not present

## 2018-11-22 DIAGNOSIS — C3432 Malignant neoplasm of lower lobe, left bronchus or lung: Secondary | ICD-10-CM

## 2018-11-22 DIAGNOSIS — C3490 Malignant neoplasm of unspecified part of unspecified bronchus or lung: Secondary | ICD-10-CM

## 2018-11-22 LAB — CBC WITH DIFFERENTIAL (CANCER CENTER ONLY)
Abs Immature Granulocytes: 0.01 10*3/uL (ref 0.00–0.07)
Basophils Absolute: 0 10*3/uL (ref 0.0–0.1)
Basophils Relative: 0 %
EOS ABS: 0 10*3/uL (ref 0.0–0.5)
Eosinophils Relative: 0 %
HCT: 43.4 % (ref 39.0–52.0)
Hemoglobin: 14.5 g/dL (ref 13.0–17.0)
Immature Granulocytes: 0 %
Lymphocytes Relative: 23 %
Lymphs Abs: 0.7 10*3/uL (ref 0.7–4.0)
MCH: 33.9 pg (ref 26.0–34.0)
MCHC: 33.4 g/dL (ref 30.0–36.0)
MCV: 101.4 fL — ABNORMAL HIGH (ref 80.0–100.0)
Monocytes Absolute: 0.1 10*3/uL (ref 0.1–1.0)
Monocytes Relative: 3 %
Neutro Abs: 2 10*3/uL (ref 1.7–7.7)
Neutrophils Relative %: 74 %
PLATELETS: 186 10*3/uL (ref 150–400)
RBC: 4.28 MIL/uL (ref 4.22–5.81)
RDW: 14.6 % (ref 11.5–15.5)
WBC Count: 2.8 10*3/uL — ABNORMAL LOW (ref 4.0–10.5)
nRBC: 0 % (ref 0.0–0.2)

## 2018-11-22 LAB — CMP (CANCER CENTER ONLY)
ALT: 14 U/L (ref 0–44)
AST: 19 U/L (ref 15–41)
Albumin: 3.5 g/dL (ref 3.5–5.0)
Alkaline Phosphatase: 90 U/L (ref 38–126)
Anion gap: 10 (ref 5–15)
BUN: 15 mg/dL (ref 8–23)
CO2: 24 mmol/L (ref 22–32)
Calcium: 9.8 mg/dL (ref 8.9–10.3)
Chloride: 105 mmol/L (ref 98–111)
Creatinine: 0.88 mg/dL (ref 0.61–1.24)
GFR, Estimated: >60 mL/min (ref >60)
Glucose, Bld: 279 mg/dL — ABNORMAL HIGH (ref 70–99)
Potassium: 4.8 mmol/L (ref 3.5–5.1)
Sodium: 139 mmol/L (ref 135–145)
Total Bilirubin: 0.2 mg/dL — ABNORMAL LOW (ref 0.3–1.2)
Total Protein: 7.5 g/dL (ref 6.5–8.1)

## 2018-11-22 MED ORDER — SODIUM CHLORIDE 0.9 % IV SOLN
20.0000 mg | Freq: Once | INTRAVENOUS | Status: AC
  Administered 2018-11-22: 20 mg via INTRAVENOUS
  Filled 2018-11-22: qty 2

## 2018-11-22 MED ORDER — PALONOSETRON HCL INJECTION 0.25 MG/5ML
INTRAVENOUS | Status: AC
  Filled 2018-11-22: qty 5

## 2018-11-22 MED ORDER — SODIUM CHLORIDE 0.9 % IV SOLN
192.6000 mg | Freq: Once | INTRAVENOUS | Status: AC
  Administered 2018-11-22: 190 mg via INTRAVENOUS
  Filled 2018-11-22: qty 19

## 2018-11-22 MED ORDER — FAMOTIDINE IN NACL 20-0.9 MG/50ML-% IV SOLN
20.0000 mg | Freq: Once | INTRAVENOUS | Status: AC
  Administered 2018-11-22: 20 mg via INTRAVENOUS

## 2018-11-22 MED ORDER — DIPHENHYDRAMINE HCL 50 MG/ML IJ SOLN
INTRAMUSCULAR | Status: AC
  Filled 2018-11-22: qty 1

## 2018-11-22 MED ORDER — FAMOTIDINE IN NACL 20-0.9 MG/50ML-% IV SOLN
INTRAVENOUS | Status: AC
  Filled 2018-11-22: qty 50

## 2018-11-22 MED ORDER — DIPHENHYDRAMINE HCL 50 MG/ML IJ SOLN
50.0000 mg | Freq: Once | INTRAMUSCULAR | Status: AC
  Administered 2018-11-22: 50 mg via INTRAVENOUS

## 2018-11-22 MED ORDER — SODIUM CHLORIDE 0.9 % IV SOLN
50.0000 mg/m2 | Freq: Once | INTRAVENOUS | Status: AC
  Administered 2018-11-22: 84 mg via INTRAVENOUS
  Filled 2018-11-22: qty 14

## 2018-11-22 MED ORDER — PALONOSETRON HCL INJECTION 0.25 MG/5ML
0.2500 mg | Freq: Once | INTRAVENOUS | Status: AC
  Administered 2018-11-22: 0.25 mg via INTRAVENOUS

## 2018-11-22 MED ORDER — SODIUM CHLORIDE 0.9 % IV SOLN
Freq: Once | INTRAVENOUS | Status: AC
  Administered 2018-11-22: 09:00:00 via INTRAVENOUS
  Filled 2018-11-22: qty 250

## 2018-11-22 NOTE — Patient Instructions (Signed)
[facility] Discharge Instructions for Patients Receiving Chemotherapy  Today you received the following chemotherapy agents: paclitaxel (Taxol) and carboplatin (Paraplatin).   To help prevent nausea and vomiting after your treatment, we encourage you to take your nausea medication as prescribed by your physician.     If you develop nausea and vomiting that is not controlled by your nausea medication, call the clinic.   BELOW ARE SYMPTOMS THAT SHOULD BE REPORTED IMMEDIATELY:  *FEVER GREATER THAN 100.5 F  *CHILLS WITH OR WITHOUT FEVER  NAUSEA AND VOMITING THAT IS NOT CONTROLLED WITH YOUR NAUSEA MEDICATION  *UNUSUAL SHORTNESS OF BREATH  *UNUSUAL BRUISING OR BLEEDING  TENDERNESS IN MOUTH AND THROAT WITH OR WITHOUT PRESENCE OF ULCERS  *URINARY PROBLEMS  *BOWEL PROBLEMS  UNUSUAL RASH Items with * indicate a potential emergency and should be followed up as soon as possible.  Feel free to call the clinic should you have any questions or concerns. The clinic phone number is [phone].  Please show the [facility] at check-in to the Emergency Department and triage nurse.  Paclitaxel injection What is this medicine? PACLITAXEL (PAK li TAX el) is a chemotherapy drug. It targets fast dividing cells, like cancer cells, and causes these cells to die. This medicine is used to treat ovarian cancer, breast cancer, lung cancer, Kaposi's sarcoma, and other cancers. This medicine may be used for other purposes; ask your health care provider or pharmacist if you have questions. COMMON BRAND NAME(S): Onxol, Taxol What should I tell my health care provider before I take this medicine? They need to know if you have any of these conditions: -history of irregular heartbeat -liver disease -low blood counts, like low white cell, platelet, or red cell counts -lung or breathing disease, like asthma -tingling of the fingers or toes, or other nerve disorder -an unusual or  allergic reaction to paclitaxel, alcohol, polyoxyethylated castor oil, other chemotherapy, other medicines, foods, dyes, or preservatives -pregnant or trying to get pregnant -breast-feeding How should I use this medicine? This drug is given as an infusion into a vein. It is administered in a hospital or clinic by a specially trained health care professional. Talk to your pediatrician regarding the use of this medicine in children. Special care may be needed. Overdosage: If you think you have taken too much of this medicine contact a poison control center or emergency room at once. NOTE: This medicine is only for you. Do not share this medicine with others. What if I miss a dose? It is important not to miss your dose. Call your doctor or health care professional if you are unable to keep an appointment. What may interact with this medicine? Do not take this medicine with any of the following medications: -disulfiram -metronidazole This medicine may also interact with the following medications: -antiviral medicines for hepatitis, HIV or AIDS -certain antibiotics like erythromycin and clarithromycin -certain medicines for fungal infections like ketoconazole and itraconazole -certain medicines for seizures like carbamazepine, phenobarbital, phenytoin -gemfibrozil -nefazodone -rifampin -St. John's wort This list may not describe all possible interactions. Give your health care provider a list of all the medicines, herbs, non-prescription drugs, or dietary supplements you use. Also tell them if you smoke, drink alcohol, or use illegal drugs. Some items may interact with your medicine. What should I watch for while using this medicine? Your condition will be monitored carefully while you are receiving this medicine. You will need important blood work done while you are taking this medicine. This  medicine can cause serious allergic reactions. To reduce your risk you will need to take other  medicine(s) before treatment with this medicine. If you experience allergic reactions like skin rash, itching or hives, swelling of the face, lips, or tongue, tell your doctor or health care professional right away. In some cases, you may be given additional medicines to help with side effects. Follow all directions for their use. This drug may make you feel generally unwell. This is not uncommon, as chemotherapy can affect healthy cells as well as cancer cells. Report any side effects. Continue your course of treatment even though you feel ill unless your doctor tells you to stop. Call your doctor or health care professional for advice if you get a fever, chills or sore throat, or other symptoms of a cold or flu. Do not treat yourself. This drug decreases your body's ability to fight infections. Try to avoid being around people who are sick. This medicine may increase your risk to bruise or bleed. Call your doctor or health care professional if you notice any unusual bleeding. Be careful brushing and flossing your teeth or using a toothpick because you may get an infection or bleed more easily. If you have any dental work done, tell your dentist you are receiving this medicine. Avoid taking products that contain aspirin, acetaminophen, ibuprofen, naproxen, or ketoprofen unless instructed by your doctor. These medicines may hide a fever. Do not become pregnant while taking this medicine. Women should inform their doctor if they wish to become pregnant or think they might be pregnant. There is a potential for serious side effects to an unborn child. Talk to your health care professional or pharmacist for more information. Do not breast-feed an infant while taking this medicine. Men are advised not to father a child while receiving this medicine. This product may contain alcohol. Ask your pharmacist or healthcare provider if this medicine contains alcohol. Be sure to tell all healthcare providers you are  taking this medicine. Certain medicines, like metronidazole and disulfiram, can cause an unpleasant reaction when taken with alcohol. The reaction includes flushing, headache, nausea, vomiting, sweating, and increased thirst. The reaction can last from 30 minutes to several hours. What side effects may I notice from receiving this medicine? Side effects that you should report to your doctor or health care professional as soon as possible: -allergic reactions like skin rash, itching or hives, swelling of the face, lips, or tongue -breathing problems -changes in vision -fast, irregular heartbeat -high or low blood pressure -mouth sores -pain, tingling, numbness in the hands or feet -signs of decreased platelets or bleeding - bruising, pinpoint red spots on the skin, black, tarry stools, blood in the urine -signs of decreased red blood cells - unusually weak or tired, feeling faint or lightheaded, falls -signs of infection - fever or chills, cough, sore throat, pain or difficulty passing urine -signs and symptoms of liver injury like dark yellow or brown urine; general ill feeling or flu-like symptoms; light-colored stools; loss of appetite; nausea; right upper belly pain; unusually weak or tired; yellowing of the eyes or skin -swelling of the ankles, feet, hands -unusually slow heartbeat Side effects that usually do not require medical attention (report to your doctor or health care professional if they continue or are bothersome): -diarrhea -hair loss -loss of appetite -muscle or joint pain -nausea, vomiting -pain, redness, or irritation at site where injected -tiredness This list may not describe all possible side effects. Call your doctor for medical advice  about side effects. You may report side effects to FDA at 1-800-FDA-1088. Where should I keep my medicine? This drug is given in a hospital or clinic and will not be stored at home. NOTE: This sheet is a summary. It may not cover all  possible information. If you have questions about this medicine, talk to your doctor, pharmacist, or health care provider.  2019 Elsevier/Gold Standard (2017-06-19 13:14:55)  Carboplatin injection What is this medicine? CARBOPLATIN (KAR boe pla tin) is a chemotherapy drug. It targets fast dividing cells, like cancer cells, and causes these cells to die. This medicine is used to treat ovarian cancer and many other cancers. This medicine may be used for other purposes; ask your health care provider or pharmacist if you have questions. COMMON BRAND NAME(S): Paraplatin What should I tell my health care provider before I take this medicine? They need to know if you have any of these conditions: -blood disorders -hearing problems -kidney disease -recent or ongoing radiation therapy -an unusual or allergic reaction to carboplatin, cisplatin, other chemotherapy, other medicines, foods, dyes, or preservatives -pregnant or trying to get pregnant -breast-feeding How should I use this medicine? This drug is usually given as an infusion into a vein. It is administered in a hospital or clinic by a specially trained health care professional. Talk to your pediatrician regarding the use of this medicine in children. Special care may be needed. Overdosage: If you think you have taken too much of this medicine contact a poison control center or emergency room at once. NOTE: This medicine is only for you. Do not share this medicine with others. What if I miss a dose? It is important not to miss a dose. Call your doctor or health care professional if you are unable to keep an appointment. What may interact with this medicine? -medicines for seizures -medicines to increase blood counts like filgrastim, pegfilgrastim, sargramostim -some antibiotics like amikacin, gentamicin, neomycin, streptomycin, tobramycin -vaccines Talk to your doctor or health care professional before taking any of these  medicines: -acetaminophen -aspirin -ibuprofen -ketoprofen -naproxen This list may not describe all possible interactions. Give your health care provider a list of all the medicines, herbs, non-prescription drugs, or dietary supplements you use. Also tell them if you smoke, drink alcohol, or use illegal drugs. Some items may interact with your medicine. What should I watch for while using this medicine? Your condition will be monitored carefully while you are receiving this medicine. You will need important blood work done while you are taking this medicine. This drug may make you feel generally unwell. This is not uncommon, as chemotherapy can affect healthy cells as well as cancer cells. Report any side effects. Continue your course of treatment even though you feel ill unless your doctor tells you to stop. In some cases, you may be given additional medicines to help with side effects. Follow all directions for their use. Call your doctor or health care professional for advice if you get a fever, chills or sore throat, or other symptoms of a cold or flu. Do not treat yourself. This drug decreases your body's ability to fight infections. Try to avoid being around people who are sick. This medicine may increase your risk to bruise or bleed. Call your doctor or health care professional if you notice any unusual bleeding. Be careful brushing and flossing your teeth or using a toothpick because you may get an infection or bleed more easily. If you have any dental work done, tell your dentist  you are receiving this medicine. Avoid taking products that contain aspirin, acetaminophen, ibuprofen, naproxen, or ketoprofen unless instructed by your doctor. These medicines may hide a fever. Do not become pregnant while taking this medicine. Women should inform their doctor if they wish to become pregnant or think they might be pregnant. There is a potential for serious side effects to an unborn child. Talk to  your health care professional or pharmacist for more information. Do not breast-feed an infant while taking this medicine. What side effects may I notice from receiving this medicine? Side effects that you should report to your doctor or health care professional as soon as possible: -allergic reactions like skin rash, itching or hives, swelling of the face, lips, or tongue -signs of infection - fever or chills, cough, sore throat, pain or difficulty passing urine -signs of decreased platelets or bleeding - bruising, pinpoint red spots on the skin, black, tarry stools, nosebleeds -signs of decreased red blood cells - unusually weak or tired, fainting spells, lightheadedness -breathing problems -changes in hearing -changes in vision -chest pain -high blood pressure -low blood counts - This drug may decrease the number of white blood cells, red blood cells and platelets. You may be at increased risk for infections and bleeding. -nausea and vomiting -pain, swelling, redness or irritation at the injection site -pain, tingling, numbness in the hands or feet -problems with balance, talking, walking -trouble passing urine or change in the amount of urine Side effects that usually do not require medical attention (report to your doctor or health care professional if they continue or are bothersome): -hair loss -loss of appetite -metallic taste in the mouth or changes in taste This list may not describe all possible side effects. Call your doctor for medical advice about side effects. You may report side effects to FDA at 1-800-FDA-1088. Where should I keep my medicine? This drug is given in a hospital or clinic and will not be stored at home. NOTE: This sheet is a summary. It may not cover all possible information. If you have questions about this medicine, talk to your doctor, pharmacist, or health care provider.  2019 Elsevier/Gold Standard (2008-01-21 14:38:05)

## 2018-11-22 NOTE — Telephone Encounter (Signed)
-----   Message from Ladell Pier, MD sent at 11/22/2018  1:26 PM EST ----- Please call patient, blood sugar is high,likely due to decadron, avoid concentrated sweets next few days

## 2018-11-22 NOTE — Telephone Encounter (Signed)
Notified his sister of elevation on glucose most likely from decadron. She will have him limit concentrated sweets for a few days as MD suggested.

## 2018-11-22 NOTE — Progress Notes (Signed)
Went to infusion to introduce myself as Arboriculturist and to offer available resources.  Discussed one-time $48 Engineer, drilling to assist with personal expenses while going through treatment.   Gave my card for any additional financial questions or concerns and with needed information on back. He verbalized understanding.

## 2018-11-25 ENCOUNTER — Ambulatory Visit
Admission: RE | Admit: 2018-11-25 | Discharge: 2018-11-25 | Disposition: A | Discharge status: Home / Self Care | Payer: Medicare Other | Source: Ambulatory Visit | Attending: Radiation Oncology | Admitting: Radiation Oncology

## 2018-11-25 DIAGNOSIS — Z510 Encounter for antineoplastic radiation therapy: Secondary | ICD-10-CM | POA: Diagnosis not present

## 2018-11-26 ENCOUNTER — Ambulatory Visit
Admission: RE | Admit: 2018-11-26 | Discharge: 2018-11-26 | Disposition: A | Discharge status: Home / Self Care | Payer: Medicare Other | Source: Ambulatory Visit | Attending: Radiation Oncology | Admitting: Radiation Oncology

## 2018-11-26 DIAGNOSIS — Z510 Encounter for antineoplastic radiation therapy: Secondary | ICD-10-CM | POA: Diagnosis not present

## 2018-11-27 ENCOUNTER — Telehealth: Payer: Self-pay | Admitting: *Deleted

## 2018-11-27 ENCOUNTER — Encounter: Payer: Self-pay | Admitting: General Practice

## 2018-11-27 ENCOUNTER — Ambulatory Visit
Admission: RE | Admit: 2018-11-27 | Discharge: 2018-11-27 | Disposition: A | Discharge status: Home / Self Care | Payer: Medicare Other | Source: Ambulatory Visit | Attending: Radiation Oncology | Admitting: Radiation Oncology

## 2018-11-27 DIAGNOSIS — Z510 Encounter for antineoplastic radiation therapy: Secondary | ICD-10-CM | POA: Diagnosis not present

## 2018-11-27 NOTE — Progress Notes (Signed)
Requested authorization forms completed and faxed to [phone] and original returned to [place].

## 2018-11-27 NOTE — Progress Notes (Signed)
[place] CSW Progress Note  Request from patient to complete [place] - form given to desk RN, w request to fax to number provided on form ([place]).  This will allow patient to have Medicaid funded transport to [facility] appointments.  Edwyna Shell, LCSW Clinical Social Worker Phone:  [phone]

## 2018-11-27 NOTE — Telephone Encounter (Signed)
-----   Message from Teodoro Spray, RN sent at 11/22/2018  1:07 PM EST ----- Regarding: Dr. Burna Sis time chemo Dr. Benay Spice Pt. First time taxol/carbo. Pt tolerated well. First time chemo followup call.

## 2018-11-27 NOTE — Telephone Encounter (Signed)
Faxed Medicaid transportation exception verification forms  to [facility] [phone]

## 2018-11-27 NOTE — Telephone Encounter (Signed)
Attempted to call patient for chemo follow up @ all 3 #'s listed. No answer and no vm available that identifies it is pt's #

## 2018-11-28 ENCOUNTER — Other Ambulatory Visit: Payer: Self-pay | Admitting: *Deleted

## 2018-11-28 ENCOUNTER — Ambulatory Visit
Admission: RE | Admit: 2018-11-28 | Discharge: 2018-11-28 | Disposition: A | Discharge status: Home / Self Care | Payer: Medicare Other | Source: Ambulatory Visit | Attending: Radiation Oncology | Admitting: Radiation Oncology

## 2018-11-28 DIAGNOSIS — C3432 Malignant neoplasm of lower lobe, left bronchus or lung: Secondary | ICD-10-CM

## 2018-11-28 DIAGNOSIS — Z510 Encounter for antineoplastic radiation therapy: Secondary | ICD-10-CM | POA: Diagnosis not present

## 2018-11-29 ENCOUNTER — Ambulatory Visit
Admission: RE | Admit: 2018-11-29 | Discharge: 2018-11-29 | Disposition: A | Discharge status: Home / Self Care | Payer: Medicare Other | Source: Ambulatory Visit | Attending: Radiation Oncology | Admitting: Radiation Oncology

## 2018-11-29 ENCOUNTER — Inpatient Hospital Stay (HOSPITAL_BASED_OUTPATIENT_CLINIC_OR_DEPARTMENT_OTHER): Payer: Medicare Other | Admitting: Nurse Practitioner

## 2018-11-29 ENCOUNTER — Encounter: Payer: Self-pay | Admitting: Oncology

## 2018-11-29 ENCOUNTER — Encounter: Payer: Self-pay | Admitting: Nurse Practitioner

## 2018-11-29 ENCOUNTER — Inpatient Hospital Stay: Payer: Medicare Other

## 2018-11-29 VITALS — BP 124/75 | HR 100 | Temp 97.7°F | Resp 18 | Ht 64.0 in | Wt 131.2 lb

## 2018-11-29 DIAGNOSIS — C3432 Malignant neoplasm of lower lobe, left bronchus or lung: Secondary | ICD-10-CM | POA: Diagnosis not present

## 2018-11-29 DIAGNOSIS — K7460 Unspecified cirrhosis of liver: Secondary | ICD-10-CM

## 2018-11-29 DIAGNOSIS — Z510 Encounter for antineoplastic radiation therapy: Secondary | ICD-10-CM | POA: Diagnosis not present

## 2018-11-29 DIAGNOSIS — Z5111 Encounter for antineoplastic chemotherapy: Secondary | ICD-10-CM | POA: Diagnosis not present

## 2018-11-29 DIAGNOSIS — J449 Chronic obstructive pulmonary disease, unspecified: Secondary | ICD-10-CM | POA: Diagnosis not present

## 2018-11-29 DIAGNOSIS — D649 Anemia, unspecified: Secondary | ICD-10-CM

## 2018-11-29 DIAGNOSIS — R197 Diarrhea, unspecified: Secondary | ICD-10-CM

## 2018-11-29 LAB — CBC WITH DIFFERENTIAL (CANCER CENTER ONLY)
Abs Immature Granulocytes: 0.07 10*3/uL (ref 0.00–0.07)
Basophils Absolute: 0.1 10*3/uL (ref 0.0–0.1)
Basophils Relative: 1 %
Eosinophils Absolute: 0.1 10*3/uL (ref 0.0–0.5)
Eosinophils Relative: 1 %
HEMATOCRIT: 43.2 % (ref 39.0–52.0)
HEMOGLOBIN: 14.5 g/dL (ref 13.0–17.0)
Immature Granulocytes: 1 %
Lymphocytes Relative: 23 %
Lymphs Abs: 1.5 10*3/uL (ref 0.7–4.0)
MCH: 34 pg (ref 26.0–34.0)
MCHC: 33.6 g/dL (ref 30.0–36.0)
MCV: 101.2 fL — ABNORMAL HIGH (ref 80.0–100.0)
Monocytes Absolute: 0.3 10*3/uL (ref 0.1–1.0)
Monocytes Relative: 5 %
Neutro Abs: 4.5 10*3/uL (ref 1.7–7.7)
Neutrophils Relative %: 69 %
Platelet Count: 191 10*3/uL (ref 150–400)
RBC: 4.27 MIL/uL (ref 4.22–5.81)
RDW: 13.9 % (ref 11.5–15.5)
WBC Count: 6.5 10*3/uL (ref 4.0–10.5)
nRBC: 0 % (ref 0.0–0.2)

## 2018-11-29 LAB — CMP (CANCER CENTER ONLY)
ALK PHOS: 90 U/L (ref 38–126)
ALT: 18 U/L (ref 0–44)
AST: 24 U/L (ref 15–41)
Albumin: 3.8 g/dL (ref 3.5–5.0)
Anion gap: 10 (ref 5–15)
BUN: 9 mg/dL (ref 8–23)
CO2: 25 mmol/L (ref 22–32)
Calcium: 9.5 mg/dL (ref 8.9–10.3)
Chloride: 107 mmol/L (ref 98–111)
Creatinine: 0.76 mg/dL (ref 0.61–1.24)
GFR, Est AFR Am: >60 mL/min (ref >60)
GFR, Estimated: >60 mL/min (ref >60)
Glucose, Bld: 122 mg/dL — ABNORMAL HIGH (ref 70–99)
Potassium: 4.3 mmol/L (ref 3.5–5.1)
SODIUM: 142 mmol/L (ref 135–145)
Total Bilirubin: 0.4 mg/dL (ref 0.3–1.2)
Total Protein: 7.5 g/dL (ref 6.5–8.1)

## 2018-11-29 MED ORDER — DIPHENHYDRAMINE HCL 50 MG/ML IJ SOLN
INTRAMUSCULAR | Status: AC
  Filled 2018-11-29: qty 1

## 2018-11-29 MED ORDER — FAMOTIDINE IN NACL 20-0.9 MG/50ML-% IV SOLN
20.0000 mg | Freq: Once | INTRAVENOUS | Status: AC
  Administered 2018-11-29: 20 mg via INTRAVENOUS

## 2018-11-29 MED ORDER — PALONOSETRON HCL INJECTION 0.25 MG/5ML
0.2500 mg | Freq: Once | INTRAVENOUS | Status: AC
  Administered 2018-11-29: 0.25 mg via INTRAVENOUS

## 2018-11-29 MED ORDER — SODIUM CHLORIDE 0.9 % IV SOLN: 10.0000 mg | Freq: Once | INTRAVENOUS | Status: DC

## 2018-11-29 MED ORDER — PALONOSETRON HCL INJECTION 0.25 MG/5ML
INTRAVENOUS | Status: AC
  Filled 2018-11-29: qty 5

## 2018-11-29 MED ORDER — SODIUM CHLORIDE 0.9 % IV SOLN
Freq: Once | INTRAVENOUS | Status: AC
  Administered 2018-11-29: 13:00:00 via INTRAVENOUS
  Filled 2018-11-29: qty 250

## 2018-11-29 MED ORDER — DEXAMETHASONE SODIUM PHOSPHATE 10 MG/ML IJ SOLN
INTRAMUSCULAR | Status: AC
  Filled 2018-11-29: qty 1

## 2018-11-29 MED ORDER — DEXAMETHASONE SODIUM PHOSPHATE 10 MG/ML IJ SOLN
10.0000 mg | Freq: Once | INTRAMUSCULAR | Status: AC
  Administered 2018-11-29: 10 mg via INTRAVENOUS

## 2018-11-29 MED ORDER — SODIUM CHLORIDE 0.9 % IV SOLN
50.0000 mg/m2 | Freq: Once | INTRAVENOUS | Status: AC
  Administered 2018-11-29: 84 mg via INTRAVENOUS
  Filled 2018-11-29: qty 14

## 2018-11-29 MED ORDER — SODIUM CHLORIDE 0.9 % IV SOLN
206.8000 mg | Freq: Once | INTRAVENOUS | Status: AC
  Administered 2018-11-29: 210 mg via INTRAVENOUS
  Filled 2018-11-29: qty 21

## 2018-11-29 MED ORDER — DIPHENHYDRAMINE HCL 50 MG/ML IJ SOLN
50.0000 mg | Freq: Once | INTRAMUSCULAR | Status: AC
  Administered 2018-11-29: 50 mg via INTRAVENOUS

## 2018-11-29 NOTE — Progress Notes (Signed)
Pt is approved for the $700 [facility] grant.  °

## 2018-11-29 NOTE — Progress Notes (Signed)
  [facility] OFFICE PROGRESS NOTE   Diagnosis: Non-small cell lung cancer  INTERVAL HISTORY:   Mr. Shomaker returns as scheduled.  He began radiation 11/19/2018.  He completed cycle 1 weekly Taxol/carboplatin 11/22/2018.  He denies nausea/vomiting.  No mouth sores.  He developed diarrhea on day 2.  This lasted for 2 days, better now.  No signs of allergic reaction.  He has persistent dyspnea on exertion, cough.  No hemoptysis.  No fever.  Objective:  Vital signs in last 24 hours:  Blood pressure 124/75, pulse 100, temperature 97.7 F (36.5 C), temperature source Oral, resp. rate 18, height 5\' 4"  (1.626 m), weight 131 lb 3.2 oz (59.5 kg), SpO2 98 %.    HEENT: No thrush or ulcers.  Poor dentition. Resp: Distant breath sounds, scattered inspiratory wheezes.  No respiratory distress. Cardio: Regular rate and rhythm. GI: Abdomen soft and nontender.  No hepatomegaly. Vascular: No leg edema.  Lab Results:  Lab Results  Component Value Date   WBC 6.5 11/29/2018   HGB 14.5 11/29/2018   HCT 43.2 11/29/2018   MCV 101.2 (H) 11/29/2018   PLT 191 11/29/2018   NEUTROABS 4.5 11/29/2018    Imaging:  No results found.  Medications: I have reviewed the patient's current medications.  Assessment/Plan: 1.  Non-small cell lung cancer-squamous cell carcinoma  CT chest 09/19/2018 concerning for an obstructing left lower lobe mass, small left pleural effusion, 4 mm right lower lobe nodule  CT head 09/20/2018- no acute abnormality  Bronchoscopy 10/02/2018- complete occlusion of the left lower lobe with an obstructing mass, biopsies, washing, and brushing reveal squamous cell carcinoma, biopsies of level 7 and 4L nodes-negative  PET scan 10/08/2018-hypermetabolic left hilar mass obstructing the left lower lobe bronchus with left lower lobe collapse, no evidence of metastatic lymphadenopathy or distant metastases  Clinical stage Ib (T2N0)  Radiation beginning 11/19/2018  Cycle 1  weekly Taxol/carboplatin 11/22/2018  Cycle 2 weekly Taxol/carboplatin 11/29/2018 2.Tobacco and alcohol use 3.COPD 4.Weight loss 5.Anemia 6.History of tuberculosis 7. Changes of cirrhosis on the PET scan 10/08/2018  Disposition: Mr. Verdejo appears stable.  He continues radiation.  He has completed 1 cycle of weekly Taxol/carboplatin.  He had diarrhea on days 2 and 3 but otherwise tolerated well.  Plan to proceed with cycle 2 weekly Taxol/carboplatin today as scheduled.  He understands to contact the office with persistent and or poorly controlled diarrhea.  We reviewed the CBC from today.  Counts are adequate for treatment.  We will see him in follow-up in 2 weeks.  He will contact the office in the interim as outlined above or with any other problems.  Plan reviewed with Dr. Benay Spice.   Ned Card ANP/GNP-BC   11/29/2018  11:35 AM

## 2018-11-29 NOTE — Patient Instructions (Signed)
[facility] Discharge Instructions for Patients Receiving Chemotherapy  Today you received the following chemotherapy agents Paclitaxel (TAXOL) & Carboplatin (PARAPLATIN).  To help prevent nausea and vomiting after your treatment, we encourage you to take your nausea medication as prescribed.  If you develop nausea and vomiting that is not controlled by your nausea medication, call the clinic.   BELOW ARE SYMPTOMS THAT SHOULD BE REPORTED IMMEDIATELY:  *FEVER GREATER THAN 100.5 F  *CHILLS WITH OR WITHOUT FEVER  NAUSEA AND VOMITING THAT IS NOT CONTROLLED WITH YOUR NAUSEA MEDICATION  *UNUSUAL SHORTNESS OF BREATH  *UNUSUAL BRUISING OR BLEEDING  TENDERNESS IN MOUTH AND THROAT WITH OR WITHOUT PRESENCE OF ULCERS  *URINARY PROBLEMS  *BOWEL PROBLEMS  UNUSUAL RASH Items with * indicate a potential emergency and should be followed up as soon as possible.  Feel free to call the clinic should you have any questions or concerns. The clinic phone number is [phone].  Please show the CHEMO ALERT CARD at check-in to the Emergency Department and triage nurse.   

## 2018-12-02 ENCOUNTER — Ambulatory Visit
Admission: RE | Admit: 2018-12-02 | Discharge: 2018-12-02 | Disposition: A | Discharge status: Home / Self Care | Payer: Medicare Other | Source: Ambulatory Visit | Attending: Radiation Oncology | Admitting: Radiation Oncology

## 2018-12-02 DIAGNOSIS — C3492 Malignant neoplasm of unspecified part of left bronchus or lung: Secondary | ICD-10-CM | POA: Diagnosis not present

## 2018-12-02 DIAGNOSIS — Z510 Encounter for antineoplastic radiation therapy: Secondary | ICD-10-CM | POA: Insufficient documentation

## 2018-12-02 DIAGNOSIS — Z9981 Dependence on supplemental oxygen: Secondary | ICD-10-CM | POA: Insufficient documentation

## 2018-12-02 DIAGNOSIS — Z87891 Personal history of nicotine dependence: Secondary | ICD-10-CM | POA: Diagnosis not present

## 2018-12-03 ENCOUNTER — Ambulatory Visit
Admission: RE | Admit: 2018-12-03 | Discharge: 2018-12-03 | Disposition: A | Discharge status: Home / Self Care | Payer: Medicare Other | Source: Ambulatory Visit | Attending: Radiation Oncology | Admitting: Radiation Oncology

## 2018-12-03 DIAGNOSIS — Z510 Encounter for antineoplastic radiation therapy: Secondary | ICD-10-CM | POA: Diagnosis not present

## 2018-12-04 ENCOUNTER — Ambulatory Visit
Admission: RE | Admit: 2018-12-04 | Discharge: 2018-12-04 | Disposition: A | Discharge status: Home / Self Care | Payer: Medicare Other | Source: Ambulatory Visit | Attending: Radiation Oncology | Admitting: Radiation Oncology

## 2018-12-04 DIAGNOSIS — Z510 Encounter for antineoplastic radiation therapy: Secondary | ICD-10-CM | POA: Diagnosis not present

## 2018-12-05 ENCOUNTER — Ambulatory Visit
Admission: RE | Admit: 2018-12-05 | Discharge: 2018-12-05 | Disposition: A | Discharge status: Home / Self Care | Payer: Medicare Other | Source: Ambulatory Visit | Attending: Radiation Oncology | Admitting: Radiation Oncology

## 2018-12-05 DIAGNOSIS — Z510 Encounter for antineoplastic radiation therapy: Secondary | ICD-10-CM | POA: Diagnosis not present

## 2018-12-06 ENCOUNTER — Ambulatory Visit
Admission: RE | Admit: 2018-12-06 | Discharge: 2018-12-06 | Disposition: A | Discharge status: Home / Self Care | Payer: Medicare Other | Source: Ambulatory Visit | Attending: Radiation Oncology | Admitting: Radiation Oncology

## 2018-12-06 ENCOUNTER — Inpatient Hospital Stay: Payer: Medicare Other | Attending: Oncology

## 2018-12-06 ENCOUNTER — Inpatient Hospital Stay: Payer: Medicare Other

## 2018-12-06 VITALS — BP 107/66 | HR 100 | Temp 98.7°F | Resp 18

## 2018-12-06 DIAGNOSIS — C3432 Malignant neoplasm of lower lobe, left bronchus or lung: Secondary | ICD-10-CM

## 2018-12-06 DIAGNOSIS — J449 Chronic obstructive pulmonary disease, unspecified: Secondary | ICD-10-CM | POA: Diagnosis not present

## 2018-12-06 DIAGNOSIS — D649 Anemia, unspecified: Secondary | ICD-10-CM | POA: Insufficient documentation

## 2018-12-06 DIAGNOSIS — R634 Abnormal weight loss: Secondary | ICD-10-CM | POA: Diagnosis not present

## 2018-12-06 DIAGNOSIS — Z5111 Encounter for antineoplastic chemotherapy: Secondary | ICD-10-CM | POA: Insufficient documentation

## 2018-12-06 DIAGNOSIS — D696 Thrombocytopenia, unspecified: Secondary | ICD-10-CM | POA: Diagnosis not present

## 2018-12-06 DIAGNOSIS — Z510 Encounter for antineoplastic radiation therapy: Secondary | ICD-10-CM | POA: Diagnosis not present

## 2018-12-06 LAB — CBC WITH DIFFERENTIAL (CANCER CENTER ONLY)
Abs Immature Granulocytes: 0.01 10*3/uL (ref 0.00–0.07)
Basophils Absolute: 0 10*3/uL (ref 0.0–0.1)
Basophils Relative: 1 %
Eosinophils Absolute: 0 10*3/uL (ref 0.0–0.5)
Eosinophils Relative: 1 %
HCT: 41.6 % (ref 39.0–52.0)
HEMOGLOBIN: 14.4 g/dL (ref 13.0–17.0)
Immature Granulocytes: 0 %
LYMPHS ABS: 0.9 10*3/uL (ref 0.7–4.0)
LYMPHS PCT: 27 %
MCH: 34 pg (ref 26.0–34.0)
MCHC: 34.6 g/dL (ref 30.0–36.0)
MCV: 98.3 fL (ref 80.0–100.0)
Monocytes Absolute: 0.6 10*3/uL (ref 0.1–1.0)
Monocytes Relative: 17 %
Neutro Abs: 1.7 10*3/uL (ref 1.7–7.7)
Neutrophils Relative %: 54 %
Platelet Count: 145 10*3/uL — ABNORMAL LOW (ref 150–400)
RBC: 4.23 MIL/uL (ref 4.22–5.81)
RDW: 12.9 % (ref 11.5–15.5)
WBC Count: 3.2 10*3/uL — ABNORMAL LOW (ref 4.0–10.5)
nRBC: 0 % (ref 0.0–0.2)

## 2018-12-06 LAB — CMP (CANCER CENTER ONLY)
ALT: 13 U/L (ref 0–44)
AST: 20 U/L (ref 15–41)
Albumin: 3.8 g/dL (ref 3.5–5.0)
Alkaline Phosphatase: 86 U/L (ref 38–126)
Anion gap: 10 (ref 5–15)
BUN: 10 mg/dL (ref 8–23)
CO2: 22 mmol/L (ref 22–32)
CREATININE: 0.76 mg/dL (ref 0.61–1.24)
Calcium: 9.8 mg/dL (ref 8.9–10.3)
Chloride: 103 mmol/L (ref 98–111)
GFR, Estimated: >60 mL/min (ref >60)
Glucose, Bld: 122 mg/dL — ABNORMAL HIGH (ref 70–99)
Potassium: 4.3 mmol/L (ref 3.5–5.1)
SODIUM: 135 mmol/L (ref 135–145)
Total Bilirubin: 0.7 mg/dL (ref 0.3–1.2)
Total Protein: 7.4 g/dL (ref 6.5–8.1)

## 2018-12-06 MED ORDER — SODIUM CHLORIDE 0.9 % IV SOLN
Freq: Once | INTRAVENOUS | Status: AC
  Administered 2018-12-06: 14:00:00 via INTRAVENOUS
  Filled 2018-12-06: qty 250

## 2018-12-06 MED ORDER — DEXAMETHASONE SODIUM PHOSPHATE 10 MG/ML IJ SOLN
INTRAMUSCULAR | Status: AC
  Filled 2018-12-06: qty 1

## 2018-12-06 MED ORDER — SODIUM CHLORIDE 0.9 % IV SOLN
50.0000 mg/m2 | Freq: Once | INTRAVENOUS | Status: AC
  Administered 2018-12-06: 84 mg via INTRAVENOUS
  Filled 2018-12-06: qty 14

## 2018-12-06 MED ORDER — PALONOSETRON HCL INJECTION 0.25 MG/5ML
0.2500 mg | Freq: Once | INTRAVENOUS | Status: AC
  Administered 2018-12-06: 0.25 mg via INTRAVENOUS

## 2018-12-06 MED ORDER — PALONOSETRON HCL INJECTION 0.25 MG/5ML
INTRAVENOUS | Status: AC
  Filled 2018-12-06: qty 5

## 2018-12-06 MED ORDER — DIPHENHYDRAMINE HCL 50 MG/ML IJ SOLN
50.0000 mg | Freq: Once | INTRAMUSCULAR | Status: AC
  Administered 2018-12-06: 50 mg via INTRAVENOUS

## 2018-12-06 MED ORDER — FAMOTIDINE IN NACL 20-0.9 MG/50ML-% IV SOLN
20.0000 mg | Freq: Once | INTRAVENOUS | Status: AC
  Administered 2018-12-06: 20 mg via INTRAVENOUS

## 2018-12-06 MED ORDER — FAMOTIDINE IN NACL 20-0.9 MG/50ML-% IV SOLN
INTRAVENOUS | Status: AC
  Filled 2018-12-06: qty 50

## 2018-12-06 MED ORDER — DIPHENHYDRAMINE HCL 50 MG/ML IJ SOLN
INTRAMUSCULAR | Status: AC
  Filled 2018-12-06: qty 1

## 2018-12-06 MED ORDER — DEXAMETHASONE SODIUM PHOSPHATE 10 MG/ML IJ SOLN
10.0000 mg | Freq: Once | INTRAMUSCULAR | Status: AC
  Administered 2018-12-06: 10 mg via INTRAVENOUS

## 2018-12-06 MED ORDER — SODIUM CHLORIDE 0.9 % IV SOLN
206.8000 mg | Freq: Once | INTRAVENOUS | Status: AC
  Administered 2018-12-06: 210 mg via INTRAVENOUS
  Filled 2018-12-06: qty 21

## 2018-12-06 NOTE — Patient Instructions (Signed)
[facility] Discharge Instructions for Patients Receiving Chemotherapy  Today you received the following chemotherapy agents: paclitaxel (Taxol) and carboplatin (Paraplatin).   To help prevent nausea and vomiting after your treatment, we encourage you to take your nausea medication as prescribed by your physician.     If you develop nausea and vomiting that is not controlled by your nausea medication, call the clinic.   BELOW ARE SYMPTOMS THAT SHOULD BE REPORTED IMMEDIATELY:  *FEVER GREATER THAN 100.5 F  *CHILLS WITH OR WITHOUT FEVER  NAUSEA AND VOMITING THAT IS NOT CONTROLLED WITH YOUR NAUSEA MEDICATION  *UNUSUAL SHORTNESS OF BREATH  *UNUSUAL BRUISING OR BLEEDING  TENDERNESS IN MOUTH AND THROAT WITH OR WITHOUT PRESENCE OF ULCERS  *URINARY PROBLEMS  *BOWEL PROBLEMS  UNUSUAL RASH Items with * indicate a potential emergency and should be followed up as soon as possible.  Feel free to call the clinic should you have any questions or concerns. The clinic phone number is [phone].  Please show the [place] at check-in to the Emergency Department and triage nurse.

## 2018-12-09 ENCOUNTER — Ambulatory Visit
Admission: RE | Admit: 2018-12-09 | Discharge: 2018-12-09 | Disposition: A | Discharge status: Home / Self Care | Payer: Medicare Other | Source: Ambulatory Visit | Attending: Radiation Oncology | Admitting: Radiation Oncology

## 2018-12-09 DIAGNOSIS — Z510 Encounter for antineoplastic radiation therapy: Secondary | ICD-10-CM | POA: Diagnosis not present

## 2018-12-10 ENCOUNTER — Ambulatory Visit: Payer: Medicare Other | Admitting: Pulmonary Disease

## 2018-12-10 ENCOUNTER — Ambulatory Visit
Admission: RE | Admit: 2018-12-10 | Discharge: 2018-12-10 | Disposition: A | Discharge status: Home / Self Care | Payer: Medicare Other | Source: Ambulatory Visit | Attending: Radiation Oncology | Admitting: Radiation Oncology

## 2018-12-10 ENCOUNTER — Ambulatory Visit (INDEPENDENT_AMBULATORY_CARE_PROVIDER_SITE_OTHER): Payer: Medicare Other | Admitting: Adult Health

## 2018-12-10 ENCOUNTER — Encounter: Payer: Self-pay | Admitting: Adult Health

## 2018-12-10 DIAGNOSIS — E43 Unspecified severe protein-calorie malnutrition: Secondary | ICD-10-CM | POA: Diagnosis not present

## 2018-12-10 DIAGNOSIS — C3432 Malignant neoplasm of lower lobe, left bronchus or lung: Secondary | ICD-10-CM

## 2018-12-10 DIAGNOSIS — J449 Chronic obstructive pulmonary disease, unspecified: Secondary | ICD-10-CM

## 2018-12-10 DIAGNOSIS — Z510 Encounter for antineoplastic radiation therapy: Secondary | ICD-10-CM | POA: Diagnosis not present

## 2018-12-10 MED ORDER — ALBUTEROL SULFATE HFA 108 (90 BASE) MCG/ACT IN AERS: 2.0000 | INHALATION_SPRAY | Freq: Four times a day (QID) | RESPIRATORY_TRACT | 2 refills | Status: DC | PRN

## 2018-12-10 NOTE — Progress Notes (Signed)
@Patient  ID: Ronald [name], male    DOB: 02-06-1956, 63 y.o.   MRN: [id number]  Chief Complaint  Patient presents with  . Follow-up    COPD     Referring provider: No ref. provider found  HPI: 63 year old male former smoker (Nov 2019) seen for pulmonary consult December 2019 for a left lower lobe lung mass and mediastinal adenopathy that required endobronchial ultrasound-guided transbronchial needle aspiration.  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  TEST/EVENTS :   Bronchoscopy image: 100% occlusion of the LLL Tumor at the level of the left main subcarina   08/02/2018: Bronchoscopy pathology Cytology, washings, brushings and forcep biopsies of the left lower lobe endobronchial mass consistent with squamous cell carcinoma Station 4L and 7- for malignancy   PET scan 10/08/2018-hypermetabolic left hilar mass obstructing the left lower lobe bronchus with left lower lobe collapse, no evidence of metastatic lymphadenopathy or distant metastases  Clinical stage Ib (T2N0)  Radiation beginning 11/19/2018  Cycle 1 weekly Taxol/carboplatin 11/22/2018 Cycle 2 weekly Taxol/carboplatin 11/29/2018  PFT December 2019 showed FEV1 at 50%, ratio 56 FVC 68%, DLCO 59%  12/10/2018 Follow up : COPD , Lung Cancer ., O2 RF  Patient returns for a 55-month follow-up.  Patient has underlying moderate to severe COPD.  He quit smoking in November 2019.  He remains on Stiolto daily.  Says overall breathing is doing okay Has daily dry cough . Uses albuterol occasionally . No hemoptysis or chest pain .  Gets winded with heavy activity . Energy level is lower.   Patient was seen by thoracic surgery Dr. Roxan Hockey October 11, 2018 with recommendations that it would take a pneumonectomy to achieve complete resection.  After discussion with patient and consideration that he was a borderline candidate they preferred to  proceed with chemo and radiation. He is currently undergoing chemotherapy and radiation. Says overall he is tolerating pretty well. Drinking 2 ensure daily . Appetite has fallen off.  Has mild diarrhea.     No Known Allergies  Immunization History  Administered Date(s) Administered  . Pneumococcal Polysaccharide-23 09/22/2018    Past Medical History:  Diagnosis Date  . Alcoholism ([place])   . Anemia    iron deficiency  . Collapse of left lung    lower lobe  . COPD (chronic obstructive pulmonary disease) ([place])   . Family history of adverse reaction to anesthesia    sister had a bad headache  . Hypertension   . Mediastinal adenopathy     and endobronchial lesion  . Pneumonia   . TB (pulmonary tuberculosis)   . Wears dentures     Tobacco History: Social History   Tobacco Use  Smoking Status Former Smoker  . Packs/day: 2.00  . Types: Cigarettes  . Last attempt to quit: 09/19/2018  . Years since quitting: 0.2  Smokeless Tobacco Current User  . Types: Snuff  Tobacco Comment   none since admission 08/2018   Ready to quit: Not Answered Counseling given: Not Answered Comment: none since admission 08/2018   Outpatient Medications Prior to Visit  Medication Sig Dispense Refill  . albuterol (PROVENTIL HFA;VENTOLIN HFA) 108 (90 Base) MCG/ACT inhaler Inhale 2 puffs into the lungs every 6 (six) hours as needed for wheezing or shortness of breath. 1 Inhaler 2  . albuterol (PROVENTIL) (2.5 MG/3ML) 0.083% nebulizer solution Take 3 mLs (2.5 mg total) by nebulization every 4 (four) hours as needed for wheezing or  shortness of breath. 75 mL 5  . cetirizine (ZYRTEC) 10 MG tablet Take 10 mg by mouth daily as needed for allergies.    Marland Kitchen dexamethasone (DECADRON) 2 MG tablet Take #5 (10mg ) night before 1st chemo and at 6 am day of 1st chemo 10 tablet 0  . feeding supplement, ENSURE ENLIVE, (ENSURE ENLIVE) LIQD Take 237 mLs by mouth 2 (two) times daily between meals. 539 mL 12  . folic  acid (FOLVITE) 1 MG tablet Take 1 tablet (1 mg total) by mouth daily. 30 tablet 0  . Guaifenesin (MUCINEX MAXIMUM STRENGTH) 1200 MG TB12 Take 1,200 mg by mouth daily.    Marland Kitchen ibuprofen (ADVIL,MOTRIN) 200 MG tablet Take 400 mg by mouth daily as needed for headache or moderate pain.    . Magnesium 500 MG CAPS Take 500 mg by mouth daily.    . prochlorperazine (COMPAZINE) 10 MG tablet Take 1 tablet (10 mg total) by mouth every 6 (six) hours as needed for nausea. 60 tablet 1  . thiamine 100 MG tablet Take 1 tablet (100 mg total) by mouth daily. 30 tablet 0  . Tiotropium Bromide-Olodaterol (STIOLTO RESPIMAT) 2.5-2.5 MCG/ACT AERS Inhale 2 puffs into the lungs daily. 1 Inhaler 5   No facility-administered medications prior to visit.      Review of Systems:   Constitutional:   No  weight loss, night sweats,  Fevers, chills,  +fatigue, or  lassitude.  HEENT:   No headaches,  Difficulty swallowing,  Tooth/dental problems, or  Sore throat,                No sneezing, itching, ear ache, nasal congestion, post nasal drip,   CV:  No chest pain,  Orthopnea, PND, swelling in lower extremities, anasarca, dizziness, palpitations, syncope.   GI  No heartburn, indigestion, abdominal pain, nausea, vomiting, diarrhea, change in bowel habits, loss of appetite, bloody stools.   Resp: .  No wheezing.  No chest wall deformity  Skin: no rash or lesions.  GU: no dysuria, change in color of urine, no urgency or frequency.  No flank pain, no hematuria   MS:  No joint pain or swelling.  No decreased range of motion.  No back pain.    Physical Exam  BP 114/64 (BP Location: Right Arm, Cuff Size: Normal)   Ht 5\' 4"  (1.626 m)   Wt 132 lb 9.6 oz (60.1 kg)   SpO2 94%   BMI 22.76 kg/m   GEN: A/Ox3; pleasant , NAD, thin and elderly    HEENT:  [place]/AT,  EACs-clear, TMs-wnl, NOSE-clear, THROAT-clear, no lesions, no postnasal drip or exudate noted.   NECK:  Supple w/ fair ROM; no JVD; normal carotid impulses w/o  bruits; no thyromegaly or nodules palpated; no lymphadenopathy.    RESP  Clear  P & A; w/o, wheezes/ rales/ or rhonchi. no accessory muscle use, no dullness to percussion  CARD:  RRR, no m/r/g, no peripheral edema, pulses intact, no cyanosis or clubbing.  GI:   Soft & nt; nml bowel sounds; no organomegaly or masses detected.   Musco: Warm bil, no deformities or joint swelling noted.   Neuro: alert, no focal deficits noted.    Skin: Warm, no lesions or rashes    Lab Results:  CBC    Component Value Date/Time   WBC 3.2 (L) 12/06/2018 1227   WBC 5.5 10/27/2018 2254   RBC 4.23 12/06/2018 1227   HGB 14.4 12/06/2018 1227   HCT 41.6 12/06/2018 1227  PLT 145 (L) 12/06/2018 1227   MCV 98.3 12/06/2018 1227   MCH 34.0 12/06/2018 1227   MCHC 34.6 12/06/2018 1227   RDW 12.9 12/06/2018 1227   LYMPHSABS 0.9 12/06/2018 1227   MONOABS 0.6 12/06/2018 1227   EOSABS 0.0 12/06/2018 1227   BASOSABS 0.0 12/06/2018 1227    BMET    Component Value Date/Time   NA 135 12/06/2018 1227   K 4.3 12/06/2018 1227   CL 103 12/06/2018 1227   CO2 22 12/06/2018 1227   GLUCOSE 122 (H) 12/06/2018 1227   BUN 10 12/06/2018 1227   CREATININE 0.76 12/06/2018 1227   CALCIUM 9.8 12/06/2018 1227   GFRNONAA >60 12/06/2018 1227   GFRAA >60 12/06/2018 1227    BNP    Component Value Date/Time   BNP 79.0 10/27/2018 2254    ProBNP No results found for: PROBNP  Imaging: No results found.  CARBOplatin (PARAPLATIN) 190 mg in sodium chloride 0.9 % 250 mL chemo infusion    Date Action Dose Route User   11/22/2018 1230 Rate/Dose Verify (none) (none) Teodoro Spray, RN   11/22/2018 1225 Rate/Dose Change (none) (none) Teodoro Spray, RN   11/22/2018 1225 New Bag/Given 190 mg Intravenous Heller, Antony Contras, RN    CARBOplatin (PARAPLATIN) 210 mg in sodium chloride 0.9 % 250 mL chemo infusion    Date Action Dose Route User   11/29/2018 1544 Rate/Dose Change (none) (none) Georgianne Fick, RN     11/29/2018 1544 New Bag/Given 210 mg Intravenous Alfonse Flavors C, RN    CARBOplatin (PARAPLATIN) 210 mg in sodium chloride 0.9 % 250 mL chemo infusion    Date Action Dose Route User   12/06/2018 1710 Rate/Dose Verify (none) (none) Ishmael Holter, RN   12/06/2018 1639 Rate/Dose Change (none) (none) Ishmael Holter, RN   12/06/2018 1639 New Bag/Given 210 mg Intravenous Georgette Dover M, RN    dexamethasone (DECADRON) 20 mg in sodium chloride 0.9 % 50 mL IVPB    Date Action Dose Route User   11/22/2018 0948 Rate/Dose Change (none) (none) Teodoro Spray, RN   11/22/2018 0948 New Bag/Given 20 mg Intravenous Heller, Antony Contras, RN    dexamethasone (DECADRON) injection 10 mg    Date Action Dose Route User   11/29/2018 1325 Given 10 mg Intravenous Alfonse Flavors C, RN    dexamethasone (DECADRON) injection 10 mg    Date Action Dose Route User   12/06/2018 1428 Given 10 mg Intravenous Mosca, Bridget M, RN    diphenhydrAMINE (BENADRYL) injection 50 mg    Date Action Dose Route User   11/22/2018 0924 Given 50 mg Intravenous Heller, Antony Contras, RN    diphenhydrAMINE (BENADRYL) injection 50 mg    Date Action Dose Route User   11/29/2018 1325 Given 50 mg Intravenous Alfonse Flavors C, RN    diphenhydrAMINE (BENADRYL) injection 50 mg    Date Action Dose Route User   12/06/2018 1426 Given 50 mg Intravenous Ishmael Holter, RN    famotidine (PEPCID) IVPB 20 mg premix    Date Action Dose Route User   11/22/2018 [phone] Rate/Dose Verify (none) (none) Teodoro Spray, RN   11/22/2018 0932 Rate/Dose Change (none) (none) Teodoro Spray, RN   11/22/2018 0932 New Bag/Given 20 mg Intravenous Teodoro Spray, RN    famotidine (PEPCID) IVPB 20 mg premix    Date Action Dose Route User   11/29/2018 1340 Rate/Dose Verify (none) (none) Georgianne Fick, RN   11/29/2018 1338  New Bag/Given 20 mg Intravenous Georgianne Fick, RN    famotidine (PEPCID) IVPB 20 mg  premix    Date Action Dose Route User   12/06/2018 1432 Rate/Dose Change (none) (none) Ishmael Holter, RN   12/06/2018 1432 Rate/Dose Verify (none) (none) Ishmael Holter, RN   12/06/2018 1432 New Bag/Given 20 mg Intravenous Ishmael Holter, RN    PACLitaxel (TAXOL) 84 mg in sodium chloride 0.9 % 250 mL chemo infusion (</= 80mg /m2)    Date Action Dose Route User   11/22/2018 1214 Rate/Dose Change (none) (none) Teodoro Spray, RN   11/22/2018 1214 Rate/Dose Change (none) (none) Teodoro Spray, RN   11/22/2018 1145 Rate/Dose Change (none) (none) Teodoro Spray, RN   11/22/2018 1145 Rate/Dose Change (none) (none) Teodoro Spray, RN   11/22/2018 1130 Rate/Dose Change (none) (none) Heller, Antony Contras, RN    PACLitaxel (TAXOL) 84 mg in sodium chloride 0.9 % 250 mL chemo infusion (</= 80mg /m2)    Date Action Dose Route User   11/29/2018 1437 Rate/Dose Change (none) (none) Georgianne Fick, RN   11/29/2018 1437 New Bag/Given 84 mg Intravenous Veverly Fells, RN    PACLitaxel (TAXOL) 84 mg in sodium chloride 0.9 % 250 mL chemo infusion (</= 80mg /m2)    Date Action Dose Route User   12/06/2018 1634 Rate/Dose Change (none) (none) Ishmael Holter, RN   12/06/2018 1633 Rate/Dose Change (none) (none) Ishmael Holter, RN   12/06/2018 1533 Rate/Dose Change (none) (none) Ishmael Holter, RN   12/06/2018 1533 New Bag/Given 84 mg Intravenous Georgette Dover M, RN    palonosetron (ALOXI) injection 0.25 mg    Date Action Dose Route User   11/22/2018 [phone] Given 0.25 mg Intravenous Teodoro Spray, RN    palonosetron (ALOXI) injection 0.25 mg    Date Action Dose Route User   11/29/2018 1325 Given 0.25 mg Intravenous Georgianne Fick, RN    palonosetron (ALOXI) injection 0.25 mg    Date Action Dose Route User   12/06/2018 1424 Given 0.25 mg Intravenous Ishmael Holter, RN    0.9 %  sodium chloride infusion    Date Action Dose Route User   11/22/2018 1259 Rate/Dose Change (none)  (none) Teodoro Spray, RN   11/22/2018 1255 Rate/Dose Change (none) (none) Teodoro Spray, RN   11/22/2018 1255 Restarted (none) (none) Teodoro Spray, RN   11/22/2018 1221 Rate/Dose Change (none) (none) Teodoro Spray, RN   11/22/2018 1218 Rate/Dose Change (none) (none) Heller, Antony Contras, RN    0.9 %  sodium chloride infusion    Date Action Dose Route User   11/29/2018 1619 Rate/Dose Change (none) (none) Georgianne Fick, RN   11/29/2018 1617 Restarted (none) (none) Georgianne Fick, RN   11/29/2018 1616 Rate/Dose Change (none) (none) Georgianne Fick, RN   11/29/2018 1615 Restarted (none) (none) Georgianne Fick, RN   11/29/2018 1541 Rate/Dose Verify (none) (none) Georgianne Fick, RN    0.9 %  sodium chloride infusion    Date Action Dose Route User   12/06/2018 1713 Rate/Dose Change (none) (none) Ishmael Holter, RN   12/06/2018 1710 Rate/Dose Change (none) (none) Ishmael Holter, RN   12/06/2018 1710 Rate/Dose Change (none) (none) Ishmael Holter, RN   12/06/2018 1709 Rate/Dose Change (none) (none) Ishmael Holter, RN   12/06/2018 1709 Restarted (none) (none) Mosca, Hadley Pen, RN      PFT Results Latest Ref Rng & Units  10/07/2018 09/23/2018  FVC-Pre L 2.52 -  FVC-Predicted Pre % 68 35  FVC-Post L 2.28 -  FVC-Predicted Post % 61 -  Pre FEV1/FVC % % 56 56  Post FEV1/FCV % % 60 -  FEV1-Pre L 1.41 0.74  FEV1-Predicted Pre % 50 26  FEV1-Post L 1.37 -  DLCO UNC% % 59 -  DLCO COR %Predicted % 93 -    No results found for: NITRICOXIDE      Assessment & Plan:   COPD (chronic obstructive pulmonary disease) ([facility]) Moderate to severe COPD - well controlled on Stiolto   Plan  Patient Instructions  Continue on Stiolto 2 puffs daily, rinse after use Activity as tolerated Continue with follow-up and protocol per oncology Follow-up with Dr. Valeta Harms in 4 months and As needed       Lung cancer ([place]) Squamous cell lung cancer -  underlying chemo and radiation .  Continue with oncology follow up   Protein-calorie malnutrition, severe Cont with ensure      Rexene Edison, NP 12/10/2018

## 2018-12-10 NOTE — Addendum Note (Signed)
Addended by: Annie Paras D on: 12/10/2018 03:15 PM   Modules accepted: Orders

## 2018-12-10 NOTE — Assessment & Plan Note (Signed)
Squamous cell lung cancer - underlying chemo and radiation .  Continue with oncology follow up

## 2018-12-10 NOTE — Progress Notes (Signed)
PCCM: Agree. Thanks for seeing him.  [facility] 12/10/2018 5:54 PM

## 2018-12-10 NOTE — Assessment & Plan Note (Signed)
Cont with ensure

## 2018-12-10 NOTE — Assessment & Plan Note (Signed)
Moderate to severe COPD - well controlled on Stiolto   Plan  Patient Instructions  Continue on Stiolto 2 puffs daily, rinse after use Activity as tolerated Continue with follow-up and protocol per oncology Follow-up with Dr. Valeta Harms in 4 months and As needed

## 2018-12-10 NOTE — Patient Instructions (Signed)
Continue on Stiolto 2 puffs daily, rinse after use Activity as tolerated Continue with follow-up and protocol per oncology Follow-up with Dr. Valeta Harms in 4 months and As needed

## 2018-12-11 ENCOUNTER — Ambulatory Visit
Admission: RE | Admit: 2018-12-11 | Discharge: 2018-12-11 | Disposition: A | Discharge status: Home / Self Care | Payer: Medicare Other | Source: Ambulatory Visit | Attending: Radiation Oncology | Admitting: Radiation Oncology

## 2018-12-11 DIAGNOSIS — Z510 Encounter for antineoplastic radiation therapy: Secondary | ICD-10-CM | POA: Diagnosis not present

## 2018-12-12 ENCOUNTER — Ambulatory Visit
Admission: RE | Admit: 2018-12-12 | Discharge: 2018-12-12 | Disposition: A | Discharge status: Home / Self Care | Payer: Medicare Other | Source: Ambulatory Visit | Attending: Radiation Oncology | Admitting: Radiation Oncology

## 2018-12-12 DIAGNOSIS — Z510 Encounter for antineoplastic radiation therapy: Secondary | ICD-10-CM | POA: Diagnosis not present

## 2018-12-13 ENCOUNTER — Encounter: Payer: Self-pay | Admitting: Nurse Practitioner

## 2018-12-13 ENCOUNTER — Ambulatory Visit
Admission: RE | Admit: 2018-12-13 | Discharge: 2018-12-13 | Disposition: A | Discharge status: Home / Self Care | Payer: Medicare Other | Source: Ambulatory Visit | Attending: Radiation Oncology | Admitting: Radiation Oncology

## 2018-12-13 ENCOUNTER — Inpatient Hospital Stay (HOSPITAL_BASED_OUTPATIENT_CLINIC_OR_DEPARTMENT_OTHER): Payer: Medicare Other | Admitting: Nurse Practitioner

## 2018-12-13 ENCOUNTER — Inpatient Hospital Stay: Payer: Medicare Other

## 2018-12-13 VITALS — BP 124/70 | HR 104 | Temp 98.7°F | Resp 18 | Ht 64.0 in | Wt 130.3 lb

## 2018-12-13 VITALS — HR 93

## 2018-12-13 DIAGNOSIS — Z5111 Encounter for antineoplastic chemotherapy: Secondary | ICD-10-CM | POA: Diagnosis not present

## 2018-12-13 DIAGNOSIS — R634 Abnormal weight loss: Secondary | ICD-10-CM

## 2018-12-13 DIAGNOSIS — J449 Chronic obstructive pulmonary disease, unspecified: Secondary | ICD-10-CM

## 2018-12-13 DIAGNOSIS — C3432 Malignant neoplasm of lower lobe, left bronchus or lung: Secondary | ICD-10-CM

## 2018-12-13 DIAGNOSIS — D649 Anemia, unspecified: Secondary | ICD-10-CM

## 2018-12-13 DIAGNOSIS — D696 Thrombocytopenia, unspecified: Secondary | ICD-10-CM

## 2018-12-13 DIAGNOSIS — Z510 Encounter for antineoplastic radiation therapy: Secondary | ICD-10-CM | POA: Diagnosis not present

## 2018-12-13 LAB — CBC WITH DIFFERENTIAL (CANCER CENTER ONLY)
Abs Immature Granulocytes: 0.02 10*3/uL (ref 0.00–0.07)
BASOS ABS: 0 10*3/uL (ref 0.0–0.1)
Basophils Relative: 0 %
Eosinophils Absolute: 0 10*3/uL (ref 0.0–0.5)
Eosinophils Relative: 1 %
HCT: 40.6 % (ref 39.0–52.0)
Hemoglobin: 14.3 g/dL (ref 13.0–17.0)
Immature Granulocytes: 0 %
Lymphocytes Relative: 17 %
Lymphs Abs: 0.8 10*3/uL (ref 0.7–4.0)
MCH: 34.4 pg — ABNORMAL HIGH (ref 26.0–34.0)
MCHC: 35.2 g/dL (ref 30.0–36.0)
MCV: 97.6 fL (ref 80.0–100.0)
Monocytes Absolute: 0.4 10*3/uL (ref 0.1–1.0)
Monocytes Relative: 8 %
Neutro Abs: 3.5 10*3/uL (ref 1.7–7.7)
Neutrophils Relative %: 74 %
PLATELETS: 111 10*3/uL — AB (ref 150–400)
RBC: 4.16 MIL/uL — AB (ref 4.22–5.81)
RDW: 12.9 % (ref 11.5–15.5)
WBC Count: 4.7 10*3/uL (ref 4.0–10.5)
nRBC: 0 % (ref 0.0–0.2)

## 2018-12-13 LAB — CMP (CANCER CENTER ONLY)
ALT: 14 U/L (ref 0–44)
AST: 21 U/L (ref 15–41)
Albumin: 3.6 g/dL (ref 3.5–5.0)
Alkaline Phosphatase: 76 U/L (ref 38–126)
Anion gap: 12 (ref 5–15)
BUN: 8 mg/dL (ref 8–23)
CO2: 22 mmol/L (ref 22–32)
Calcium: 9.3 mg/dL (ref 8.9–10.3)
Chloride: 101 mmol/L (ref 98–111)
Creatinine: 0.78 mg/dL (ref 0.61–1.24)
GFR, Est AFR Am: >60 mL/min (ref >60)
GFR, Estimated: >60 mL/min (ref >60)
Glucose, Bld: 121 mg/dL — ABNORMAL HIGH (ref 70–99)
Potassium: 3.8 mmol/L (ref 3.5–5.1)
SODIUM: 135 mmol/L (ref 135–145)
Total Bilirubin: 0.5 mg/dL (ref 0.3–1.2)
Total Protein: 6.9 g/dL (ref 6.5–8.1)

## 2018-12-13 MED ORDER — PALONOSETRON HCL INJECTION 0.25 MG/5ML
INTRAVENOUS | Status: AC
  Filled 2018-12-13: qty 5

## 2018-12-13 MED ORDER — DEXAMETHASONE SODIUM PHOSPHATE 10 MG/ML IJ SOLN
INTRAMUSCULAR | Status: AC
  Filled 2018-12-13: qty 1

## 2018-12-13 MED ORDER — SODIUM CHLORIDE 0.9 % IV SOLN
50.0000 mg/m2 | Freq: Once | INTRAVENOUS | Status: AC
  Administered 2018-12-13: 84 mg via INTRAVENOUS
  Filled 2018-12-13: qty 14

## 2018-12-13 MED ORDER — SODIUM CHLORIDE 0.9 % IV SOLN
Freq: Once | INTRAVENOUS | Status: AC
  Administered 2018-12-13: 16:00:00 via INTRAVENOUS
  Filled 2018-12-13: qty 250

## 2018-12-13 MED ORDER — DEXAMETHASONE SODIUM PHOSPHATE 10 MG/ML IJ SOLN
10.0000 mg | Freq: Once | INTRAMUSCULAR | Status: AC
  Administered 2018-12-13: 10 mg via INTRAVENOUS

## 2018-12-13 MED ORDER — PALONOSETRON HCL INJECTION 0.25 MG/5ML
0.2500 mg | Freq: Once | INTRAVENOUS | Status: AC
  Administered 2018-12-13: 0.25 mg via INTRAVENOUS

## 2018-12-13 MED ORDER — SODIUM CHLORIDE 0.9 % IV SOLN
206.8000 mg | Freq: Once | INTRAVENOUS | Status: AC
  Administered 2018-12-13: 210 mg via INTRAVENOUS
  Filled 2018-12-13: qty 21

## 2018-12-13 MED ORDER — FAMOTIDINE IN NACL 20-0.9 MG/50ML-% IV SOLN
INTRAVENOUS | Status: AC
  Filled 2018-12-13: qty 50

## 2018-12-13 MED ORDER — FAMOTIDINE IN NACL 20-0.9 MG/50ML-% IV SOLN
20.0000 mg | Freq: Once | INTRAVENOUS | Status: AC
  Administered 2018-12-13: 20 mg via INTRAVENOUS

## 2018-12-13 MED ORDER — DIPHENHYDRAMINE HCL 50 MG/ML IJ SOLN
50.0000 mg | Freq: Once | INTRAMUSCULAR | Status: AC
  Administered 2018-12-13: 50 mg via INTRAVENOUS

## 2018-12-13 MED ORDER — DIPHENHYDRAMINE HCL 50 MG/ML IJ SOLN
INTRAMUSCULAR | Status: AC
  Filled 2018-12-13: qty 1

## 2018-12-13 NOTE — Patient Instructions (Signed)
[facility] Discharge Instructions for Patients Receiving Chemotherapy  Today you received the following chemotherapy agents: paclitaxel (Taxol) and carboplatin (Paraplatin).   To help prevent nausea and vomiting after your treatment, we encourage you to take your nausea medication as prescribed by your physician.     If you develop nausea and vomiting that is not controlled by your nausea medication, call the clinic.   BELOW ARE SYMPTOMS THAT SHOULD BE REPORTED IMMEDIATELY:  *FEVER GREATER THAN 100.5 F  *CHILLS WITH OR WITHOUT FEVER  NAUSEA AND VOMITING THAT IS NOT CONTROLLED WITH YOUR NAUSEA MEDICATION  *UNUSUAL SHORTNESS OF BREATH  *UNUSUAL BRUISING OR BLEEDING  TENDERNESS IN MOUTH AND THROAT WITH OR WITHOUT PRESENCE OF ULCERS  *URINARY PROBLEMS  *BOWEL PROBLEMS  UNUSUAL RASH Items with * indicate a potential emergency and should be followed up as soon as possible.  Feel free to call the clinic should you have any questions or concerns. The clinic phone number is [phone].  Please show the [facility] at check-in to the Emergency Department and triage nurse.

## 2018-12-13 NOTE — Progress Notes (Signed)
  [facility] OFFICE PROGRESS NOTE   Diagnosis: Non-small cell lung cancer  INTERVAL HISTORY:   Ronald [name] returns as scheduled.  He continues radiation.  He completed cycle 3 weekly Taxol/carboplatin 12/06/2018.  He denies nausea/vomiting.  No mouth sores.  He tends to have diarrhea for a few days after each treatment.  No numbness or tingling in the hands or feet.  He feels his breathing has overall improved.  Over the past 3 days in the evenings he has noted a small amount of blood with nose blowing.  Objective:  Vital signs in last 24 hours:  Blood pressure 124/70, pulse (!) 104, temperature 98.7 F (37.1 C), temperature source Oral, resp. rate 18, height 5\' 4"  (1.626 m), weight 130 lb 4.8 oz (59.1 kg), SpO2 98 %.    HEENT: No thrush or ulcers. Resp: Distant breath sounds. Cardio: Regular rate and rhythm. GI: Abdomen soft and nontender.  No hepatomegaly. Vascular: No leg edema.   Lab Results:  Lab Results  Component Value Date   WBC 4.7 12/13/2018   HGB 14.3 12/13/2018   HCT 40.6 12/13/2018   MCV 97.6 12/13/2018   PLT 111 (L) 12/13/2018   NEUTROABS 3.5 12/13/2018    Imaging:  No results found.  Medications: I have reviewed the patient's current medications.  Assessment/Plan: 1.Non-small cell lung cancer-squamous cell carcinoma  CT chest 09/19/2018 concerning for an obstructing left lower lobe mass, small left pleural effusion, 4 mm right lower lobe nodule  CT head 09/20/2018- no acute abnormality  Bronchoscopy 10/02/2018- complete occlusion of the left lower lobe with an obstructing mass, biopsies, washing, and brushing reveal squamous cell carcinoma, biopsies of level 7 and 4L nodes-negative  PET scan 10/08/2018-hypermetabolic left hilar mass obstructing the left lower lobe bronchus with left lower lobe collapse, no evidence of metastatic lymphadenopathy or distant metastases  Clinical stage Ib (T2N0)  Radiation beginning 11/19/2018  Cycle 1  weekly Taxol/carboplatin 11/22/2018  Cycle 2 weekly Taxol/carboplatin 11/29/2018  Cycle 3 weekly Taxol/carboplatin 12/06/2018  Cycle 4 weekly Taxol/carboplatin 12/13/2018 2.Tobacco and alcohol use 3.COPD 4.Weight loss 5.Anemia 6.History of tuberculosis 7.Changes of cirrhosis on the PET scan 10/08/2018  Disposition: Ronald [name] appears stable.  He continues radiation.  He has completed 3 cycles of weekly Taxol/carboplatin.  Plan to proceed with cycle 4 weekly today as scheduled.  We reviewed the CBC from today.  He has mild thrombocytopenia.  He understands to contact the office with any bleeding.  He will return for lab and cycle 5 weekly Taxol/carboplatin in 1 week.  We will see him in follow-up in 2 weeks.  He will contact the office in the interim as outlined above or with any other problems.  Plan reviewed with Dr. Benay Spice.    Ned Card ANP/GNP-BC   12/13/2018  1:59 PM

## 2018-12-16 ENCOUNTER — Ambulatory Visit
Admission: RE | Admit: 2018-12-16 | Discharge: 2018-12-16 | Disposition: A | Discharge status: Home / Self Care | Payer: Medicare Other | Source: Ambulatory Visit | Attending: Radiation Oncology | Admitting: Radiation Oncology

## 2018-12-16 ENCOUNTER — Telehealth: Payer: Self-pay | Admitting: Nurse Practitioner

## 2018-12-16 DIAGNOSIS — Z510 Encounter for antineoplastic radiation therapy: Secondary | ICD-10-CM | POA: Diagnosis not present

## 2018-12-16 NOTE — Telephone Encounter (Signed)
Scheduled appt per 2/14 los - left message for patient with appt date and time

## 2018-12-17 ENCOUNTER — Ambulatory Visit
Admission: RE | Admit: 2018-12-17 | Discharge: 2018-12-17 | Disposition: A | Discharge status: Home / Self Care | Payer: Medicare Other | Source: Ambulatory Visit | Attending: Radiation Oncology | Admitting: Radiation Oncology

## 2018-12-17 DIAGNOSIS — Z510 Encounter for antineoplastic radiation therapy: Secondary | ICD-10-CM | POA: Diagnosis not present

## 2018-12-18 ENCOUNTER — Ambulatory Visit
Admission: RE | Admit: 2018-12-18 | Discharge: 2018-12-18 | Disposition: A | Discharge status: Home / Self Care | Payer: Medicare Other | Source: Ambulatory Visit | Attending: Radiation Oncology | Admitting: Radiation Oncology

## 2018-12-18 DIAGNOSIS — Z510 Encounter for antineoplastic radiation therapy: Secondary | ICD-10-CM | POA: Diagnosis not present

## 2018-12-19 ENCOUNTER — Ambulatory Visit
Admission: RE | Admit: 2018-12-19 | Discharge: 2018-12-19 | Disposition: A | Discharge status: Home / Self Care | Payer: Medicare Other | Source: Ambulatory Visit | Attending: Radiation Oncology | Admitting: Radiation Oncology

## 2018-12-19 ENCOUNTER — Inpatient Hospital Stay: Payer: Medicare Other

## 2018-12-19 DIAGNOSIS — Z510 Encounter for antineoplastic radiation therapy: Secondary | ICD-10-CM | POA: Diagnosis not present

## 2018-12-19 DIAGNOSIS — Z5111 Encounter for antineoplastic chemotherapy: Secondary | ICD-10-CM | POA: Diagnosis not present

## 2018-12-19 DIAGNOSIS — C3432 Malignant neoplasm of lower lobe, left bronchus or lung: Secondary | ICD-10-CM

## 2018-12-19 LAB — CBC WITH DIFFERENTIAL (CANCER CENTER ONLY)
Abs Immature Granulocytes: 0.02 10*3/uL (ref 0.00–0.07)
BASOS ABS: 0 10*3/uL (ref 0.0–0.1)
Basophils Relative: 0 %
Eosinophils Absolute: 0 10*3/uL (ref 0.0–0.5)
Eosinophils Relative: 0 %
HCT: 38.2 % — ABNORMAL LOW (ref 39.0–52.0)
Hemoglobin: 13.4 g/dL (ref 13.0–17.0)
Immature Granulocytes: 0 %
Lymphocytes Relative: 18 %
Lymphs Abs: 0.8 10*3/uL (ref 0.7–4.0)
MCH: 33.9 pg (ref 26.0–34.0)
MCHC: 35.1 g/dL (ref 30.0–36.0)
MCV: 96.7 fL (ref 80.0–100.0)
Monocytes Absolute: 0.3 10*3/uL (ref 0.1–1.0)
Monocytes Relative: 7 %
NEUTROS ABS: 3.4 10*3/uL (ref 1.7–7.7)
Neutrophils Relative %: 75 %
Platelet Count: 107 10*3/uL — ABNORMAL LOW (ref 150–400)
RBC: 3.95 MIL/uL — ABNORMAL LOW (ref 4.22–5.81)
RDW: 12.9 % (ref 11.5–15.5)
WBC Count: 4.5 10*3/uL (ref 4.0–10.5)
nRBC: 0 % (ref 0.0–0.2)

## 2018-12-19 LAB — CMP (CANCER CENTER ONLY)
ALT: 14 U/L (ref 0–44)
AST: 22 U/L (ref 15–41)
Albumin: 3.7 g/dL (ref 3.5–5.0)
Alkaline Phosphatase: 67 U/L (ref 38–126)
Anion gap: 11 (ref 5–15)
BUN: 12 mg/dL (ref 8–23)
CO2: 19 mmol/L — AB (ref 22–32)
Calcium: 9.3 mg/dL (ref 8.9–10.3)
Chloride: 107 mmol/L (ref 98–111)
Creatinine: 0.73 mg/dL (ref 0.61–1.24)
GFR, Est AFR Am: >60 mL/min (ref >60)
GFR, Estimated: >60 mL/min (ref >60)
Glucose, Bld: 106 mg/dL — ABNORMAL HIGH (ref 70–99)
Potassium: 3.6 mmol/L (ref 3.5–5.1)
Sodium: 137 mmol/L (ref 135–145)
Total Bilirubin: 0.3 mg/dL (ref 0.3–1.2)
Total Protein: 7 g/dL (ref 6.5–8.1)

## 2018-12-20 ENCOUNTER — Inpatient Hospital Stay: Payer: Medicare Other

## 2018-12-20 ENCOUNTER — Ambulatory Visit
Admission: RE | Admit: 2018-12-20 | Discharge: 2018-12-20 | Disposition: A | Discharge status: Home / Self Care | Payer: Medicare Other | Source: Ambulatory Visit | Attending: Radiation Oncology | Admitting: Radiation Oncology

## 2018-12-20 ENCOUNTER — Other Ambulatory Visit: Payer: Medicare Other

## 2018-12-20 VITALS — BP 127/67 | HR 86 | Temp 98.2°F | Resp 16 | Ht 64.0 in | Wt 128.8 lb

## 2018-12-20 DIAGNOSIS — C3432 Malignant neoplasm of lower lobe, left bronchus or lung: Secondary | ICD-10-CM

## 2018-12-20 DIAGNOSIS — Z5111 Encounter for antineoplastic chemotherapy: Secondary | ICD-10-CM | POA: Diagnosis not present

## 2018-12-20 DIAGNOSIS — Z510 Encounter for antineoplastic radiation therapy: Secondary | ICD-10-CM | POA: Diagnosis not present

## 2018-12-20 MED ORDER — SODIUM CHLORIDE 0.9 % IV SOLN
20.0000 mg | Freq: Once | INTRAVENOUS | Status: AC
  Administered 2018-12-20: 20 mg via INTRAVENOUS
  Filled 2018-12-20: qty 2

## 2018-12-20 MED ORDER — FAMOTIDINE IN NACL 20-0.9 MG/50ML-% IV SOLN: 20.0000 mg | Freq: Once | INTRAVENOUS | Status: DC

## 2018-12-20 MED ORDER — DIPHENHYDRAMINE HCL 50 MG/ML IJ SOLN
INTRAMUSCULAR | Status: AC
  Filled 2018-12-20: qty 1

## 2018-12-20 MED ORDER — DIPHENHYDRAMINE HCL 50 MG/ML IJ SOLN
50.0000 mg | Freq: Once | INTRAMUSCULAR | Status: AC
  Administered 2018-12-20: 50 mg via INTRAVENOUS

## 2018-12-20 MED ORDER — DEXAMETHASONE SODIUM PHOSPHATE 10 MG/ML IJ SOLN
10.0000 mg | Freq: Once | INTRAMUSCULAR | Status: AC
  Administered 2018-12-20: 10 mg via INTRAVENOUS

## 2018-12-20 MED ORDER — SODIUM CHLORIDE 0.9 % IV SOLN
206.8000 mg | Freq: Once | INTRAVENOUS | Status: AC
  Administered 2018-12-20: 210 mg via INTRAVENOUS
  Filled 2018-12-20: qty 21

## 2018-12-20 MED ORDER — SODIUM CHLORIDE 0.9 % IV SOLN
Freq: Once | INTRAVENOUS | Status: AC
  Administered 2018-12-20: 14:00:00 via INTRAVENOUS
  Filled 2018-12-20: qty 250

## 2018-12-20 MED ORDER — PALONOSETRON HCL INJECTION 0.25 MG/5ML
0.2500 mg | Freq: Once | INTRAVENOUS | Status: AC
  Administered 2018-12-20: 0.25 mg via INTRAVENOUS

## 2018-12-20 MED ORDER — SODIUM CHLORIDE 0.9 % IV SOLN
50.0000 mg/m2 | Freq: Once | INTRAVENOUS | Status: AC
  Administered 2018-12-20: 84 mg via INTRAVENOUS
  Filled 2018-12-20: qty 14

## 2018-12-20 MED ORDER — PALONOSETRON HCL INJECTION 0.25 MG/5ML
INTRAVENOUS | Status: AC
  Filled 2018-12-20: qty 5

## 2018-12-20 MED ORDER — DEXAMETHASONE SODIUM PHOSPHATE 10 MG/ML IJ SOLN
INTRAMUSCULAR | Status: AC
  Filled 2018-12-20: qty 1

## 2018-12-20 NOTE — Patient Instructions (Signed)
[facility] Discharge Instructions for Patients Receiving Chemotherapy  Today you received the following chemotherapy agents: Paclitaxel (Taxol) and Carboplatin (Paraplatin)  To help prevent nausea and vomiting after your treatment, we encourage you to take your nausea medication as directed.    If you develop nausea and vomiting that is not controlled by your nausea medication, call the clinic.   BELOW ARE SYMPTOMS THAT SHOULD BE REPORTED IMMEDIATELY:  *FEVER GREATER THAN 100.5 F  *CHILLS WITH OR WITHOUT FEVER  NAUSEA AND VOMITING THAT IS NOT CONTROLLED WITH YOUR NAUSEA MEDICATION  *UNUSUAL SHORTNESS OF BREATH  *UNUSUAL BRUISING OR BLEEDING  TENDERNESS IN MOUTH AND THROAT WITH OR WITHOUT PRESENCE OF ULCERS  *URINARY PROBLEMS  *BOWEL PROBLEMS  UNUSUAL RASH Items with * indicate a potential emergency and should be followed up as soon as possible.  Feel free to call the clinic should you have any questions or concerns. The clinic phone number is [phone].  Please show the [facility] at check-in to the Emergency Department and triage nurse.

## 2018-12-23 ENCOUNTER — Ambulatory Visit
Admission: RE | Admit: 2018-12-23 | Discharge: 2018-12-23 | Disposition: A | Discharge status: Home / Self Care | Payer: Medicare Other | Source: Ambulatory Visit | Attending: Radiation Oncology | Admitting: Radiation Oncology

## 2018-12-23 DIAGNOSIS — Z510 Encounter for antineoplastic radiation therapy: Secondary | ICD-10-CM | POA: Diagnosis not present

## 2018-12-24 ENCOUNTER — Ambulatory Visit
Admission: RE | Admit: 2018-12-24 | Discharge: 2018-12-24 | Disposition: A | Discharge status: Home / Self Care | Payer: Medicare Other | Source: Ambulatory Visit | Attending: Radiation Oncology | Admitting: Radiation Oncology

## 2018-12-24 DIAGNOSIS — Z510 Encounter for antineoplastic radiation therapy: Secondary | ICD-10-CM | POA: Diagnosis not present

## 2018-12-25 ENCOUNTER — Ambulatory Visit
Admission: RE | Admit: 2018-12-25 | Discharge: 2018-12-25 | Disposition: A | Discharge status: Home / Self Care | Payer: Medicare Other | Source: Ambulatory Visit | Attending: Radiation Oncology | Admitting: Radiation Oncology

## 2018-12-25 DIAGNOSIS — Z510 Encounter for antineoplastic radiation therapy: Secondary | ICD-10-CM | POA: Diagnosis not present

## 2018-12-26 ENCOUNTER — Ambulatory Visit
Admission: RE | Admit: 2018-12-26 | Discharge: 2018-12-26 | Disposition: A | Discharge status: Home / Self Care | Payer: Medicare Other | Source: Ambulatory Visit | Attending: Radiation Oncology | Admitting: Radiation Oncology

## 2018-12-26 DIAGNOSIS — Z510 Encounter for antineoplastic radiation therapy: Secondary | ICD-10-CM | POA: Diagnosis not present

## 2018-12-26 MED FILL — OSELTAMIVIR PHOSPHATE 75 MG: 75 | 7 days supply | Qty: 7 | Fill #0

## 2018-12-27 ENCOUNTER — Inpatient Hospital Stay (HOSPITAL_BASED_OUTPATIENT_CLINIC_OR_DEPARTMENT_OTHER): Payer: Medicare Other | Admitting: Nurse Practitioner

## 2018-12-27 ENCOUNTER — Inpatient Hospital Stay: Payer: Medicare Other

## 2018-12-27 ENCOUNTER — Ambulatory Visit
Admission: RE | Admit: 2018-12-27 | Discharge: 2018-12-27 | Disposition: A | Discharge status: Home / Self Care | Payer: Medicare Other | Source: Ambulatory Visit | Attending: Radiation Oncology | Admitting: Radiation Oncology

## 2018-12-27 ENCOUNTER — Ambulatory Visit: Payer: Medicare Other

## 2018-12-27 ENCOUNTER — Encounter: Payer: Self-pay | Admitting: Nurse Practitioner

## 2018-12-27 VITALS — BP 107/70 | HR 102 | Temp 97.7°F | Resp 19 | Ht 64.0 in | Wt 129.5 lb

## 2018-12-27 VITALS — HR 94

## 2018-12-27 DIAGNOSIS — C3432 Malignant neoplasm of lower lobe, left bronchus or lung: Secondary | ICD-10-CM | POA: Diagnosis not present

## 2018-12-27 DIAGNOSIS — D649 Anemia, unspecified: Secondary | ICD-10-CM | POA: Diagnosis not present

## 2018-12-27 DIAGNOSIS — J449 Chronic obstructive pulmonary disease, unspecified: Secondary | ICD-10-CM | POA: Diagnosis not present

## 2018-12-27 DIAGNOSIS — D696 Thrombocytopenia, unspecified: Secondary | ICD-10-CM | POA: Diagnosis not present

## 2018-12-27 DIAGNOSIS — Z5111 Encounter for antineoplastic chemotherapy: Secondary | ICD-10-CM | POA: Diagnosis not present

## 2018-12-27 DIAGNOSIS — Z510 Encounter for antineoplastic radiation therapy: Secondary | ICD-10-CM | POA: Diagnosis not present

## 2018-12-27 DIAGNOSIS — R634 Abnormal weight loss: Secondary | ICD-10-CM

## 2018-12-27 LAB — CBC WITH DIFFERENTIAL (CANCER CENTER ONLY)
Abs Immature Granulocytes: 0.02 10*3/uL (ref 0.00–0.07)
Basophils Absolute: 0 10*3/uL (ref 0.0–0.1)
Basophils Relative: 1 %
Eosinophils Absolute: 0 10*3/uL (ref 0.0–0.5)
Eosinophils Relative: 0 %
HCT: 36.9 % — ABNORMAL LOW (ref 39.0–52.0)
Hemoglobin: 12.9 g/dL — ABNORMAL LOW (ref 13.0–17.0)
Immature Granulocytes: 1 %
LYMPHS PCT: 21 %
Lymphs Abs: 0.7 10*3/uL (ref 0.7–4.0)
MCH: 34.2 pg — ABNORMAL HIGH (ref 26.0–34.0)
MCHC: 35 g/dL (ref 30.0–36.0)
MCV: 97.9 fL (ref 80.0–100.0)
Monocytes Absolute: 0.3 10*3/uL (ref 0.1–1.0)
Monocytes Relative: 9 %
NEUTROS ABS: 2.4 10*3/uL (ref 1.7–7.7)
Neutrophils Relative %: 68 %
Platelet Count: 80 10*3/uL — ABNORMAL LOW (ref 150–400)
RBC: 3.77 MIL/uL — ABNORMAL LOW (ref 4.22–5.81)
RDW: 12.9 % (ref 11.5–15.5)
WBC Count: 3.5 10*3/uL — ABNORMAL LOW (ref 4.0–10.5)
nRBC: 0 % (ref 0.0–0.2)

## 2018-12-27 LAB — CMP (CANCER CENTER ONLY)
ALT: 13 U/L (ref 0–44)
AST: 22 U/L (ref 15–41)
Albumin: 3.6 g/dL (ref 3.5–5.0)
Alkaline Phosphatase: 63 U/L (ref 38–126)
Anion gap: 11 (ref 5–15)
BUN: 11 mg/dL (ref 8–23)
CO2: 23 mmol/L (ref 22–32)
Calcium: 9.1 mg/dL (ref 8.9–10.3)
Chloride: 108 mmol/L (ref 98–111)
Creatinine: 0.84 mg/dL (ref 0.61–1.24)
GFR, Est AFR Am: >60 mL/min (ref >60)
GFR, Estimated: >60 mL/min (ref >60)
Glucose, Bld: 93 mg/dL (ref 70–99)
Potassium: 3.7 mmol/L (ref 3.5–5.1)
Sodium: 142 mmol/L (ref 135–145)
Total Bilirubin: 0.3 mg/dL (ref 0.3–1.2)
Total Protein: 6.7 g/dL (ref 6.5–8.1)

## 2018-12-27 MED ORDER — SODIUM CHLORIDE 0.9 % IV SOLN
150.0000 mg | Freq: Once | INTRAVENOUS | Status: AC
  Administered 2018-12-27: 150 mg via INTRAVENOUS
  Filled 2018-12-27: qty 15

## 2018-12-27 MED ORDER — SODIUM CHLORIDE 0.9 % IV SOLN
20.0000 mg | Freq: Once | INTRAVENOUS | Status: AC
  Administered 2018-12-27: 20 mg via INTRAVENOUS
  Filled 2018-12-27: qty 2

## 2018-12-27 MED ORDER — DIPHENHYDRAMINE HCL 50 MG/ML IJ SOLN
INTRAMUSCULAR | Status: AC
  Filled 2018-12-27: qty 1

## 2018-12-27 MED ORDER — DEXAMETHASONE SODIUM PHOSPHATE 10 MG/ML IJ SOLN
INTRAMUSCULAR | Status: AC
  Filled 2018-12-27: qty 1

## 2018-12-27 MED ORDER — DEXAMETHASONE SODIUM PHOSPHATE 10 MG/ML IJ SOLN
10.0000 mg | Freq: Once | INTRAMUSCULAR | Status: AC
  Administered 2018-12-27: 10 mg via INTRAVENOUS

## 2018-12-27 MED ORDER — SODIUM CHLORIDE 0.9 % IV SOLN
50.0000 mg/m2 | Freq: Once | INTRAVENOUS | Status: AC
  Administered 2018-12-27: 84 mg via INTRAVENOUS
  Filled 2018-12-27: qty 14

## 2018-12-27 MED ORDER — PALONOSETRON HCL INJECTION 0.25 MG/5ML
INTRAVENOUS | Status: AC
  Filled 2018-12-27: qty 5

## 2018-12-27 MED ORDER — PALONOSETRON HCL INJECTION 0.25 MG/5ML
0.2500 mg | Freq: Once | INTRAVENOUS | Status: AC
  Administered 2018-12-27: 0.25 mg via INTRAVENOUS

## 2018-12-27 MED ORDER — FAMOTIDINE IN NACL 20-0.9 MG/50ML-% IV SOLN: 20.0000 mg | Freq: Once | INTRAVENOUS | Status: DC

## 2018-12-27 MED ORDER — SODIUM CHLORIDE 0.9 % IV SOLN
Freq: Once | INTRAVENOUS | Status: AC
  Administered 2018-12-27: 13:00:00 via INTRAVENOUS
  Filled 2018-12-27: qty 250

## 2018-12-27 MED ORDER — DIPHENHYDRAMINE HCL 50 MG/ML IJ SOLN
50.0000 mg | Freq: Once | INTRAMUSCULAR | Status: AC
  Administered 2018-12-27: 50 mg via INTRAVENOUS

## 2018-12-27 NOTE — Progress Notes (Addendum)
  [facility] OFFICE PROGRESS NOTE   Diagnosis: Non-small cell lung cancer  INTERVAL HISTORY:   Mr. Krupka returns as scheduled.  He continues radiation.  He completed cycle 5 weekly Taxol/carboplatin 12/20/2018.  He denies nausea/vomiting.  No mouth sores.  He again had diarrhea for a few days following the chemotherapy.  No skin rash.  Breathing continues to be improved.  Cough is unchanged.  No fever.  He notes a rash at the left back.  He denies bleeding.  Objective:  Vital signs in last 24 hours:  Blood pressure 107/70, pulse (!) 102, temperature 97.7 F (36.5 C), temperature source Oral, resp. rate 19, height 5\' 4"  (1.626 m), weight 129 lb 8 oz (58.7 kg), SpO2 97 %.    HEENT: No thrush or ulcers. Resp: Lungs clear bilaterally. Cardio: Regular rate and rhythm. GI: Abdomen soft and nontender.  No hepatomegaly. Vascular: No leg edema.  Skin: Round erythematous rash left mid back.   Lab Results:  Lab Results  Component Value Date   WBC 3.5 (L) 12/27/2018   HGB 12.9 (L) 12/27/2018   HCT 36.9 (L) 12/27/2018   MCV 97.9 12/27/2018   PLT 80 (L) 12/27/2018   NEUTROABS 2.4 12/27/2018    Imaging:  No results found.  Medications: I have reviewed the patient's current medications.  Assessment/Plan: 1.Non-small cell lung cancer-squamous cell carcinoma  CT chest 09/19/2018 concerning for an obstructing left lower lobe mass, small left pleural effusion, 4 mm right lower lobe nodule  CT head 09/20/2018-no acute abnormality  Bronchoscopy 10/02/2018-complete occlusion of the left lower lobe with an obstructing mass, biopsies, washing, and brushing reveal squamous cell carcinoma, biopsies of level 7 and 4L nodes-negative  PET scan 10/08/2018-hypermetabolic left hilar mass obstructing the left lower lobe bronchus with left lower lobe collapse, no evidence of metastatic lymphadenopathy or distant metastases  Clinical stage Ib (T2N0)  Radiation beginning  11/19/2018  Cycle 1 weekly Taxol/carboplatin 11/22/2018  Cycle 2 weekly Taxol/carboplatin 11/29/2018  Cycle 3 weekly Taxol/carboplatin 12/06/2018  Cycle 4 weekly Taxol/carboplatin 12/13/2018  Cycle 5 weekly Taxol/carboplatin 12/20/2018  Cycle 6 weekly Taxol/carboplatin 12/27/2018 2.Tobacco and alcohol use 3.COPD 4.Weight loss 5.Anemia 6.History of tuberculosis 7.Changes of cirrhosis on the PET scan 10/08/2018  Disposition: Mr. Gutridge appears stable.  He is scheduled to complete the course of radiation 12/30/2018.  He will complete the sixth and final cycle of weekly Taxol/carboplatin today as scheduled.  Carboplatin will be dose reduced due to thrombocytopenia.  He understands to contact the office with any bleeding.  He will return for a follow-up CBC in 2 weeks.  He will return for a follow-up visit and chest x-ray in 4 weeks.  He will contact the office in the interim with any problems.  Patient seen with Dr. Benay Spice.      Ned Card ANP/GNP-BC   12/27/2018  12:39 PM This was a shared visit with Ned Card.  Mr. Loletha Grayer has tolerated the treatment well.  He will complete a final cycle of Taxol/carboplatin today.  The carboplatin will be dose reduced secondary to thrombocytopenia.  Julieanne Manson, MD

## 2018-12-27 NOTE — Patient Instructions (Signed)
[facility] Discharge Instructions for Patients Receiving Chemotherapy  Today you received the following chemotherapy agents: Paclitaxel (Taxol) and Carboplatin (Paraplatin)  To help prevent nausea and vomiting after your treatment, we encourage you to take your nausea medication as directed.    If you develop nausea and vomiting that is not controlled by your nausea medication, call the clinic.   BELOW ARE SYMPTOMS THAT SHOULD BE REPORTED IMMEDIATELY:  *FEVER GREATER THAN 100.5 F  *CHILLS WITH OR WITHOUT FEVER  NAUSEA AND VOMITING THAT IS NOT CONTROLLED WITH YOUR NAUSEA MEDICATION  *UNUSUAL SHORTNESS OF BREATH  *UNUSUAL BRUISING OR BLEEDING  TENDERNESS IN MOUTH AND THROAT WITH OR WITHOUT PRESENCE OF ULCERS  *URINARY PROBLEMS  *BOWEL PROBLEMS  UNUSUAL RASH Items with * indicate a potential emergency and should be followed up as soon as possible.  Feel free to call the clinic should you have any questions or concerns. The clinic phone number is [phone].  Please show the [facility] at check-in to the Emergency Department and triage nurse.

## 2018-12-27 NOTE — Progress Notes (Signed)
Per Marlynn Perking: OK to treat with platelets of 80 (will dose reduce Carboplatin)

## 2018-12-30 ENCOUNTER — Ambulatory Visit
Admission: RE | Admit: 2018-12-30 | Discharge: 2018-12-30 | Disposition: A | Discharge status: Home / Self Care | Payer: Medicare Other | Source: Ambulatory Visit | Attending: Radiation Oncology | Admitting: Radiation Oncology

## 2018-12-30 DIAGNOSIS — Z510 Encounter for antineoplastic radiation therapy: Secondary | ICD-10-CM | POA: Diagnosis not present

## 2018-12-30 DIAGNOSIS — Z9981 Dependence on supplemental oxygen: Secondary | ICD-10-CM | POA: Insufficient documentation

## 2018-12-30 DIAGNOSIS — Z87891 Personal history of nicotine dependence: Secondary | ICD-10-CM | POA: Insufficient documentation

## 2018-12-30 DIAGNOSIS — C3492 Malignant neoplasm of unspecified part of left bronchus or lung: Secondary | ICD-10-CM | POA: Insufficient documentation

## 2019-01-02 ENCOUNTER — Encounter: Payer: Self-pay | Admitting: Radiation Oncology

## 2019-01-02 NOTE — Progress Notes (Signed)
  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 He experienced improvement in his breathing, noting decreased need of his nebulizer, as treatments went on. He denied difficulty swallowing, hemoptysis, pain, and fatigue throughout treatment. He experienced cough with clear/white sputum.   Plan: The patient has completed radiation treatment. The patient will return to radiation oncology clinic for routine followup in one month. I advised them to call or return sooner if they have any questions or concerns related to their recovery or treatment.  -----------------------------------  Blair Promise, PhD, MD  This document serves as a record of services personally performed by Gery Pray, MD. It was created on his behalf by Wilburn Mylar, a trained medical scribe. The creation of this record is based on the scribe's personal observations and the provider's statements to them. This document has been checked and approved by the attending provider.

## 2019-01-07 ENCOUNTER — Telehealth: Payer: Self-pay | Admitting: Nurse Practitioner

## 2019-01-07 NOTE — Telephone Encounter (Signed)
Tried to reach regarding 3/13 and 3/27

## 2019-01-10 ENCOUNTER — Ambulatory Visit (HOSPITAL_COMMUNITY)
Admission: RE | Admit: 2019-01-10 | Discharge: 2019-01-10 | Disposition: A | Discharge status: Home / Self Care | Payer: Medicare Other | Source: Ambulatory Visit | Attending: Nurse Practitioner | Admitting: Nurse Practitioner

## 2019-01-10 ENCOUNTER — Other Ambulatory Visit: Payer: Self-pay

## 2019-01-10 ENCOUNTER — Inpatient Hospital Stay: Payer: Medicare Other | Attending: Oncology

## 2019-01-10 DIAGNOSIS — C3432 Malignant neoplasm of lower lobe, left bronchus or lung: Secondary | ICD-10-CM | POA: Diagnosis not present

## 2019-01-10 LAB — CBC WITH DIFFERENTIAL (CANCER CENTER ONLY)
ABS IMMATURE GRANULOCYTES: 0.02 10*3/uL (ref 0.00–0.07)
Basophils Absolute: 0 10*3/uL (ref 0.0–0.1)
Basophils Relative: 0 %
Eosinophils Absolute: 0 10*3/uL (ref 0.0–0.5)
Eosinophils Relative: 1 %
HCT: 32.5 % — ABNORMAL LOW (ref 39.0–52.0)
Hemoglobin: 11.6 g/dL — ABNORMAL LOW (ref 13.0–17.0)
Immature Granulocytes: 1 %
Lymphocytes Relative: 22 %
Lymphs Abs: 0.8 10*3/uL (ref 0.7–4.0)
MCH: 34.5 pg — AB (ref 26.0–34.0)
MCHC: 35.7 g/dL (ref 30.0–36.0)
MCV: 96.7 fL (ref 80.0–100.0)
Monocytes Absolute: 0.7 10*3/uL (ref 0.1–1.0)
Monocytes Relative: 18 %
Neutro Abs: 2.3 10*3/uL (ref 1.7–7.7)
Neutrophils Relative %: 58 %
Platelet Count: 106 10*3/uL — ABNORMAL LOW (ref 150–400)
RBC: 3.36 MIL/uL — ABNORMAL LOW (ref 4.22–5.81)
RDW: 14.1 % (ref 11.5–15.5)
WBC Count: 3.9 10*3/uL — ABNORMAL LOW (ref 4.0–10.5)
nRBC: 0 % (ref 0.0–0.2)

## 2019-01-20 ENCOUNTER — Other Ambulatory Visit: Payer: Self-pay | Admitting: Adult Health

## 2019-01-24 ENCOUNTER — Inpatient Hospital Stay: Payer: Medicare Other | Admitting: Nurse Practitioner

## 2019-01-27 ENCOUNTER — Other Ambulatory Visit: Payer: Self-pay | Admitting: Nurse Practitioner

## 2019-01-27 ENCOUNTER — Telehealth: Payer: Self-pay | Admitting: Nurse Practitioner

## 2019-01-27 DIAGNOSIS — C3432 Malignant neoplasm of lower lobe, left bronchus or lung: Secondary | ICD-10-CM

## 2019-01-27 NOTE — Telephone Encounter (Signed)
I spoke with Ronald [name].  He feels well, much better than before completing the course of chemo/radiation.  He denies fever, cough, shortness of breath.  We will cancel the appointment scheduled with [place] on 01/31/2019.  He will undergo a chest CT in approximately 1 month.  He will return for a follow-up visit a few days after the CT scan.  He will contact the office prior to that with any problems.  He continues to practice social distancing.

## 2019-01-28 ENCOUNTER — Telehealth: Payer: Self-pay | Admitting: Oncology

## 2019-01-28 ENCOUNTER — Encounter: Payer: Self-pay | Admitting: General Practice

## 2019-01-28 NOTE — Telephone Encounter (Signed)
Called patient to update about rescheduled appointment. Patient is notified and would like a mailed copy of their calender.

## 2019-01-28 NOTE — Progress Notes (Signed)
[place] Team contacted patient to assess for food insecurity and other psychosocial needs during current COVID19 pandemic.  LVM encouraging callback.    [place], [place], [facility] Pager [phone] Voicemail [phone]

## 2019-01-31 ENCOUNTER — Ambulatory Visit: Payer: Medicare Other | Admitting: Nurse Practitioner

## 2019-02-17 ENCOUNTER — Other Ambulatory Visit: Payer: Self-pay | Admitting: Adult Health

## 2019-02-21 ENCOUNTER — Other Ambulatory Visit: Payer: Medicare Other

## 2019-02-24 ENCOUNTER — Ambulatory Visit
Admission: RE | Admit: 2019-02-24 | Discharge: 2019-02-24 | Disposition: A | Discharge status: Home / Self Care | Payer: Medicare Other | Source: Ambulatory Visit | Attending: Nurse Practitioner | Admitting: Nurse Practitioner

## 2019-02-24 ENCOUNTER — Other Ambulatory Visit: Payer: Self-pay

## 2019-02-24 DIAGNOSIS — C3432 Malignant neoplasm of lower lobe, left bronchus or lung: Secondary | ICD-10-CM

## 2019-02-24 MED ORDER — IOPAMIDOL (ISOVUE-300) INJECTION 61%
75.0000 mL | Freq: Once | INTRAVENOUS | Status: AC | PRN
  Administered 2019-02-24: 10:00:00 75 mL via INTRAVENOUS

## 2019-02-27 ENCOUNTER — Telehealth: Payer: Self-pay | Admitting: Oncology

## 2019-02-27 ENCOUNTER — Other Ambulatory Visit: Payer: Self-pay

## 2019-02-27 ENCOUNTER — Inpatient Hospital Stay: Payer: Medicare Other | Attending: Oncology | Admitting: Oncology

## 2019-02-27 VITALS — BP 132/81 | HR 100 | Temp 99.0°F | Resp 18 | Ht 64.0 in | Wt 139.7 lb

## 2019-02-27 DIAGNOSIS — J9819 Other pulmonary collapse: Secondary | ICD-10-CM | POA: Diagnosis not present

## 2019-02-27 DIAGNOSIS — Z8611 Personal history of tuberculosis: Secondary | ICD-10-CM | POA: Diagnosis not present

## 2019-02-27 DIAGNOSIS — R634 Abnormal weight loss: Secondary | ICD-10-CM | POA: Insufficient documentation

## 2019-02-27 DIAGNOSIS — Z7289 Other problems related to lifestyle: Secondary | ICD-10-CM | POA: Diagnosis not present

## 2019-02-27 DIAGNOSIS — Z720 Tobacco use: Secondary | ICD-10-CM | POA: Diagnosis not present

## 2019-02-27 DIAGNOSIS — K7460 Unspecified cirrhosis of liver: Secondary | ICD-10-CM | POA: Insufficient documentation

## 2019-02-27 DIAGNOSIS — D649 Anemia, unspecified: Secondary | ICD-10-CM | POA: Diagnosis not present

## 2019-02-27 DIAGNOSIS — Z9221 Personal history of antineoplastic chemotherapy: Secondary | ICD-10-CM | POA: Insufficient documentation

## 2019-02-27 DIAGNOSIS — Z923 Personal history of irradiation: Secondary | ICD-10-CM | POA: Insufficient documentation

## 2019-02-27 DIAGNOSIS — J90 Pleural effusion, not elsewhere classified: Secondary | ICD-10-CM | POA: Insufficient documentation

## 2019-02-27 DIAGNOSIS — C3432 Malignant neoplasm of lower lobe, left bronchus or lung: Secondary | ICD-10-CM | POA: Insufficient documentation

## 2019-02-27 DIAGNOSIS — J449 Chronic obstructive pulmonary disease, unspecified: Secondary | ICD-10-CM | POA: Diagnosis not present

## 2019-02-27 NOTE — Telephone Encounter (Signed)
Called and scheduled appt per 4/30 los.  Left a voice message in regards to the scheduled appt.

## 2019-02-27 NOTE — Progress Notes (Signed)
[facility] OFFICE PROGRESS NOTE   Diagnosis: Non-small cell lung cancer  INTERVAL HISTORY:   Ronald [name] returns as scheduled.  He feels well.  He has good appetite.  He is mowing his lawn.  He reports mild dyspnea.  He is using an inhaler and nebulizer.  He feels much better compared to when he was diagnosed with lung cancer.  Objective:  Vital signs in last 24 hours:  Blood pressure 132/81, pulse 100, temperature 99 F (37.2 C), temperature source Oral, resp. rate 18, height 5\' 4"  (1.626 m), weight 139 lb 11.2 oz (63.4 kg), SpO2 95 %.    HEENT: Neck without mass Lymphatics: No cervical or supraclavicular nodes Resp: Bilateral inspiratory and expiratory rhonchi/wheeze, no respiratory distress Cardio: Distant heart sounds, regular rhythm GI: No hepatomegaly Vascular: No leg edema    Lab Results:  Lab Results  Component Value Date   WBC 3.9 (L) 01/10/2019   HGB 11.6 (L) 01/10/2019   HCT 32.5 (L) 01/10/2019   MCV 96.7 01/10/2019   PLT 106 (L) 01/10/2019   NEUTROABS 2.3 01/10/2019    CMP  Lab Results  Component Value Date   NA 142 12/27/2018   K 3.7 12/27/2018   CL 108 12/27/2018   CO2 23 12/27/2018   GLUCOSE 93 12/27/2018   BUN 11 12/27/2018   CREATININE 0.84 12/27/2018   CALCIUM 9.1 12/27/2018   PROT 6.7 12/27/2018   ALBUMIN 3.6 12/27/2018   AST 22 12/27/2018   ALT 13 12/27/2018   ALKPHOS 63 12/27/2018   BILITOT 0.3 12/27/2018   GFRNONAA >60 12/27/2018   GFRAA >60 12/27/2018     Imaging:  Ct Chest W Contrast  Result Date: 02/24/2019 CLINICAL DATA:  Left lower lobe lung cancer. Status post radiation and chemotherapy. EXAM: CT CHEST WITH CONTRAST TECHNIQUE: Multidetector CT imaging of the chest was performed during intravenous contrast administration. CONTRAST:  27mL ISOVUE-300 IOPAMIDOL (ISOVUE-300) INJECTION 61% COMPARISON:  Chest CT 09/19/2018.  PET-CT 10/08/2018. FINDINGS: Creatinine was obtained on site at [facility] at [address] Results: Creatinine 0.7 mg/dL ----------------------------------- Cardiovascular: The heart size is normal. No substantial pericardial effusion. Atherosclerotic calcification is noted in the wall of the thoracic aorta. Mediastinum/Nodes: No mediastinal lymphadenopathy. No right hilar lymphadenopathy. Abnormal soft tissue in the inferior left hilum compatible with known lung lesion. There is no axillary lymphadenopathy. The esophagus has normal imaging features. Lungs/Pleura: Centrilobular emphsyema noted. 4 mm right lower lobe nodule (115/5) is stable. Stable left lower lobe collapse. Left pleural effusion has decreased in the interval. Upper Abdomen: Nodular liver contour again noted, suggesting cirrhosis. Musculoskeletal: No worrisome lytic or sclerotic osseous abnormality. IMPRESSION: 1. No new or progressive findings. 2. Persistent left lower lobe collapse compatible with known left infrahilar mass lesion. 3. Stable 4 mm right lower lobe pulmonary nodule. 4. Interval decrease in left pleural effusion. 5.  Aortic Atherosclerois (ICD10-170.0) 6.  Emphysema. ([id number]) Electronically Signed   By: Misty Stanley M.D.   On: 02/24/2019 13:18    Medications: I have reviewed the patient's current medications.   Assessment/Plan: 1. Non-small cell lung cancer-squamous cell carcinoma  CT chest 09/19/2018 concerning for an obstructing left lower lobe mass, small left pleural effusion, 4 mm right lower lobe nodule  CT head 09/20/2018-no acute abnormality  Bronchoscopy 10/02/2018-complete occlusion of the left lower lobe with an obstructing mass, biopsies, washing, and brushing reveal squamous cell carcinoma, biopsies of level 7 and 4L nodes-negative  PET scan 10/08/2018-hypermetabolic left hilar mass obstructing  the left lower lobe bronchus with left lower lobe collapse, no evidence of metastatic lymphadenopathy or distant metastases  Clinical stage Ib (T2N0)  Radiation beginning  11/19/2018  Cycle 1 weekly Taxol/carboplatin 11/22/2018  Cycle 2 weekly Taxol/carboplatin 11/29/2018  Cycle 3 weekly Taxol/carboplatin 12/06/2018  Cycle 4 weekly Taxol/carboplatin 12/13/2018  Cycle 5 weekly Taxol/carboplatin 12/20/2018  Cycle 6 weekly Taxol/carboplatin 12/27/2018  CT 02/24/2019- stable left lower lobe collapse, decreased left pleural effusion, abnormal soft tissue in the inferior left hilum 2.Tobacco and alcohol use 3.COPD 4.Weight loss 5.Anemia 6.History of tuberculosis 7.Changes of cirrhosis on the PET scan 10/08/2018    Disposition: Mr. Staheli appears well.  The restaging CT shows persistent left lower lobe collapse and no evidence of disease progression.  I discussed treatment options with Mr. Hulsebus.  I will consult with Dr. Roxan Hockey regarding the indication for surgery.  If surgery is not recommended I recommend a course of maintenance immunotherapy.  Mr. Vanalstyne will return for an office visit in 3 weeks.  I reviewed the CT images with him.  Betsy Coder, MD  02/27/2019  9:59 AM

## 2019-02-28 ENCOUNTER — Telehealth: Payer: Self-pay | Admitting: *Deleted

## 2019-02-28 NOTE — Telephone Encounter (Signed)
Telephone call received from patients sister, [name]. She is his caregiver and was not able to come to his appointment due to COVID restrictions. Discussed the outcome of the appointment and reviewed next appointment with Dr. Benay Spice. Asked her to consider making sure patient has his cell phone on him during the next visit, patient may call her during the visit and Dr. Benay Spice can discuss the plan with her on the phone. She agreed to this and will make sure he has it on May 19th.

## 2019-03-07 ENCOUNTER — Other Ambulatory Visit: Payer: Self-pay

## 2019-03-07 MED ORDER — ALBUTEROL SULFATE (2.5 MG/3ML) 0.083% IN NEBU: INHALATION_SOLUTION | RESPIRATORY_TRACT | 3 refills | Status: DC

## 2019-03-11 ENCOUNTER — Other Ambulatory Visit: Payer: Self-pay | Admitting: Oncology

## 2019-03-18 ENCOUNTER — Telehealth: Payer: Self-pay | Admitting: Oncology

## 2019-03-18 ENCOUNTER — Inpatient Hospital Stay: Payer: Medicare Other | Attending: Oncology | Admitting: Oncology

## 2019-03-18 ENCOUNTER — Other Ambulatory Visit: Payer: Self-pay

## 2019-03-18 VITALS — BP 148/85 | HR 109 | Temp 98.5°F | Resp 17 | Ht 64.0 in | Wt 146.4 lb

## 2019-03-18 DIAGNOSIS — Z8611 Personal history of tuberculosis: Secondary | ICD-10-CM | POA: Diagnosis not present

## 2019-03-18 DIAGNOSIS — D649 Anemia, unspecified: Secondary | ICD-10-CM | POA: Insufficient documentation

## 2019-03-18 DIAGNOSIS — R635 Abnormal weight gain: Secondary | ICD-10-CM

## 2019-03-18 DIAGNOSIS — K7460 Unspecified cirrhosis of liver: Secondary | ICD-10-CM | POA: Insufficient documentation

## 2019-03-18 DIAGNOSIS — Z5112 Encounter for antineoplastic immunotherapy: Secondary | ICD-10-CM | POA: Insufficient documentation

## 2019-03-18 DIAGNOSIS — C3432 Malignant neoplasm of lower lobe, left bronchus or lung: Secondary | ICD-10-CM | POA: Diagnosis present

## 2019-03-18 DIAGNOSIS — R0602 Shortness of breath: Secondary | ICD-10-CM

## 2019-03-18 DIAGNOSIS — J449 Chronic obstructive pulmonary disease, unspecified: Secondary | ICD-10-CM | POA: Diagnosis not present

## 2019-03-18 DIAGNOSIS — J90 Pleural effusion, not elsewhere classified: Secondary | ICD-10-CM | POA: Diagnosis not present

## 2019-03-18 DIAGNOSIS — Z79899 Other long term (current) drug therapy: Secondary | ICD-10-CM | POA: Insufficient documentation

## 2019-03-18 NOTE — Progress Notes (Signed)
  [facility] OFFICE PROGRESS NOTE   Diagnosis: Non-small cell lung cancer  INTERVAL HISTORY:   Mr. Schueller returns for a scheduled visit.  He has noted a recent increase in dyspnea.  He relates this to COPD.  He is using inhalers.  No other complaint.  Objective:  Vital signs in last 24 hours:  Blood pressure (!) 148/85, pulse (!) 109, temperature 98.5 F (36.9 C), temperature source Oral, resp. rate 17, height 5\' 4"  (1.626 m), weight 146 lb 6.4 oz (66.4 kg), SpO2 95 %.    HEENT: Neck without mass Lymphatics: No cervical or supraclavicular nodes Resp: Bilateral expiratory wheeze, no respiratory distress Cardio: Regular rate and rhythm GI: No hepatomegaly Vascular: No leg edema   Lab Results:  Lab Results  Component Value Date   WBC 3.9 (L) 01/10/2019   HGB 11.6 (L) 01/10/2019   HCT 32.5 (L) 01/10/2019   MCV 96.7 01/10/2019   PLT 106 (L) 01/10/2019   NEUTROABS 2.3 01/10/2019    CMP  Lab Results  Component Value Date   NA 142 12/27/2018   K 3.7 12/27/2018   CL 108 12/27/2018   CO2 23 12/27/2018   GLUCOSE 93 12/27/2018   BUN 11 12/27/2018   CREATININE 0.84 12/27/2018   CALCIUM 9.1 12/27/2018   PROT 6.7 12/27/2018   ALBUMIN 3.6 12/27/2018   AST 22 12/27/2018   ALT 13 12/27/2018   ALKPHOS 63 12/27/2018   BILITOT 0.3 12/27/2018   GFRNONAA >60 12/27/2018   GFRAA >60 12/27/2018    Medications: I have reviewed the patient's current medications.   Assessment/Plan: 1. Non-small cell lung cancer-squamous cell carcinoma, PDL 1 tumor proportion score 0%  CT chest 09/19/2018 concerning for an obstructing left lower lobe mass, small left pleural effusion, 4 mm right lower lobe nodule  CT head 09/20/2018-no acute abnormality  Bronchoscopy 10/02/2018-complete occlusion of the left lower lobe with an obstructing mass, biopsies, washing, and brushing reveal squamous cell carcinoma, biopsies of level 7 and 4L nodes-negative  PET scan  10/08/2018-hypermetabolic left hilar mass obstructing the left lower lobe bronchus with left lower lobe collapse, no evidence of metastatic lymphadenopathy or distant metastases  Clinical stage Ib (T2N0)  Radiation beginning 11/19/2018  Cycle 1 weekly Taxol/carboplatin 11/22/2018  Cycle 2 weekly Taxol/carboplatin 11/29/2018  Cycle 3 weekly Taxol/carboplatin 12/06/2018  Cycle 4 weekly Taxol/carboplatin 12/13/2018  Cycle 5 weekly Taxol/carboplatin 12/20/2018  Cycle 6 weekly Taxol/carboplatin 12/27/2018  CT 02/24/2019- stable left lower lobe collapse, decreased left pleural effusion, abnormal soft tissue in the inferior left hilum 2.Tobacco and alcohol use 3.COPD 4.Weight loss 5.Anemia 6.History of tuberculosis 7.Changes of cirrhosis on the PET scan 10/08/2018   Disposition: Mr. Folmar appears unchanged.  The dyspnea is likely secondary to COPD, though he had persistent left lower lobe collapse on the CT last month.  I discussed treatment options with Mr. Passey.  Dr. Roxan Hockey does not recommend surgery.  I recommend consolidation treatment with durvalumab.  We reviewed potential toxicities associated with durvalumab including rash, hepatitis, colitis, hypothyroidism, pneumonitis, neuropathy, and other autoimmune toxicities.  He agrees to proceed.  The plan is to start nivolumab on a 2-week schedule on 03/28/2019.  I discussed the case with his sister [name] by telephone.   Betsy Coder, MD  03/18/2019  9:37 AM

## 2019-03-18 NOTE — Patient Instructions (Signed)
Durvalumab injection What is this medicine? DURVALUMAB (dur VAL ue mab) is a monoclonal antibody. It is used to treat urothelial cancer and lung cancer. This medicine may be used for other purposes; ask your health care provider or pharmacist if you have questions. COMMON BRAND NAME(S): IMFINZI What should I tell my health care provider before I take this medicine? They need to know if you have any of these conditions: -diabetes -immune system problems -infection -inflammatory bowel disease -kidney disease -liver disease -lung or breathing disease -lupus -organ transplant -stomach or intestine problems -thyroid disease -an unusual or allergic reaction to durvalumab, other medicines, foods, dyes, or preservatives -pregnant or trying to get pregnant -breast-feeding How should I use this medicine? This medicine is for infusion into a vein. It is given by a health care professional in a hospital or clinic setting. A special MedGuide will be given to you before each treatment. Be sure to read this information carefully each time. Talk to your pediatrician regarding the use of this medicine in children. Special care may be needed. Overdosage: If you think you have taken too much of this medicine contact a poison control center or emergency room at once. NOTE: This medicine is only for you. Do not share this medicine with others. What if I miss a dose? It is important not to miss your dose. Call your doctor or health care professional if you are unable to keep an appointment. What may interact with this medicine? Interactions have not been studied. This list may not describe all possible interactions. Give your health care provider a list of all the medicines, herbs, non-prescription drugs, or dietary supplements you use. Also tell them if you smoke, drink alcohol, or use illegal drugs. Some items may interact with your medicine. What should I watch for while using this medicine? This drug  may make you feel generally unwell. Continue your course of treatment even though you feel ill unless your doctor tells you to stop. You may need blood work done while you are taking this medicine. Do not become pregnant while taking this medicine or for 3 months after stopping it. Women should inform their doctor if they wish to become pregnant or think they might be pregnant. There is a potential for serious side effects to an unborn child. Talk to your health care professional or pharmacist for more information. Do not breast-feed an infant while taking this medicine or for 3 months after stopping it. What side effects may I notice from receiving this medicine? Side effects that you should report to your doctor or health care professional as soon as possible: -allergic reactions like skin rash, itching or hives, swelling of the face, lips, or tongue -black, tarry stools -bloody or watery diarrhea -breathing problems -change in emotions or moods -change in sex drive -changes in vision -chest pain or chest tightness -chills -confusion -cough -facial flushing -fever -headache -signs and symptoms of high blood sugar such as dizziness; dry mouth; dry skin; fruity breath; nausea; stomach pain; increased hunger or thirst; increased urination -signs and symptoms of liver injury like dark yellow or brown urine; general ill feeling or flu-like symptoms; light-colored stools; loss of appetite; nausea; right upper belly pain; unusually weak or tired; yellowing of the eyes or skin -stomach pain -trouble passing urine or change in the amount of urine -weight gain or weight loss Side effects that usually do not require medical attention (report these to your doctor or health care professional if they continue or are   bothersome): -bone pain -constipation -loss of appetite -muscle pain -nausea -swelling of the ankles, feet, hands -tiredness This list may not describe all possible side effects. Call  your doctor for medical advice about side effects. You may report side effects to FDA at 1-800-FDA-1088. Where should I keep my medicine? This drug is given in a hospital or clinic and will not be stored at home. NOTE: This sheet is a summary. It may not cover all possible information. If you have questions about this medicine, talk to your doctor, pharmacist, or health care provider.  2019 Elsevier/Gold Standard (2016-12-26 19:25:04)

## 2019-03-18 NOTE — Progress Notes (Signed)
Provided patient printed material on Durvalumab/Imfinzi to take home to review with his sister, [name].

## 2019-03-18 NOTE — Telephone Encounter (Signed)
Scheduled appt per 5/19 los.  Left a voice message of appt date and time.  A calendar will be mailed out.

## 2019-03-23 ENCOUNTER — Other Ambulatory Visit: Payer: Self-pay | Admitting: Oncology

## 2019-03-28 ENCOUNTER — Other Ambulatory Visit: Payer: Self-pay | Admitting: Nurse Practitioner

## 2019-03-28 ENCOUNTER — Telehealth: Payer: Self-pay | Admitting: Nurse Practitioner

## 2019-03-28 ENCOUNTER — Inpatient Hospital Stay: Payer: Medicare Other

## 2019-03-28 ENCOUNTER — Other Ambulatory Visit: Payer: Self-pay

## 2019-03-28 VITALS — BP 128/76 | HR 86 | Temp 98.7°F | Resp 18

## 2019-03-28 DIAGNOSIS — Z5112 Encounter for antineoplastic immunotherapy: Secondary | ICD-10-CM | POA: Diagnosis not present

## 2019-03-28 DIAGNOSIS — R635 Abnormal weight gain: Secondary | ICD-10-CM

## 2019-03-28 DIAGNOSIS — C3432 Malignant neoplasm of lower lobe, left bronchus or lung: Secondary | ICD-10-CM

## 2019-03-28 LAB — TSH: TSH: 1.821 u[IU]/mL (ref 0.320–4.118)

## 2019-03-28 LAB — CBC WITH DIFFERENTIAL (CANCER CENTER ONLY)
Abs Immature Granulocytes: 0.02 10*3/uL (ref 0.00–0.07)
Basophils Absolute: 0 10*3/uL (ref 0.0–0.1)
Basophils Relative: 1 %
Eosinophils Absolute: 0.3 10*3/uL (ref 0.0–0.5)
Eosinophils Relative: 5 %
HCT: 38.3 % — ABNORMAL LOW (ref 39.0–52.0)
Hemoglobin: 13.8 g/dL (ref 13.0–17.0)
Immature Granulocytes: 0 %
Lymphocytes Relative: 18 %
Lymphs Abs: 0.9 10*3/uL (ref 0.7–4.0)
MCH: 36.5 pg — ABNORMAL HIGH (ref 26.0–34.0)
MCHC: 36 g/dL (ref 30.0–36.0)
MCV: 101.3 fL — ABNORMAL HIGH (ref 80.0–100.0)
Monocytes Absolute: 0.8 10*3/uL (ref 0.1–1.0)
Monocytes Relative: 17 %
Neutro Abs: 2.9 10*3/uL (ref 1.7–7.7)
Neutrophils Relative %: 59 %
Platelet Count: 125 10*3/uL — ABNORMAL LOW (ref 150–400)
RBC: 3.78 MIL/uL — ABNORMAL LOW (ref 4.22–5.81)
RDW: 10.6 % — ABNORMAL LOW (ref 11.5–15.5)
WBC Count: 4.9 10*3/uL (ref 4.0–10.5)
nRBC: 0 % (ref 0.0–0.2)

## 2019-03-28 LAB — CMP (CANCER CENTER ONLY)
ALT: 21 U/L (ref 0–44)
AST: 49 U/L — ABNORMAL HIGH (ref 15–41)
Albumin: 3.8 g/dL (ref 3.5–5.0)
Alkaline Phosphatase: 96 U/L (ref 38–126)
Anion gap: 10 (ref 5–15)
BUN: 6 mg/dL — ABNORMAL LOW (ref 8–23)
CO2: 21 mmol/L — ABNORMAL LOW (ref 22–32)
Calcium: 9.4 mg/dL (ref 8.9–10.3)
Chloride: 94 mmol/L — ABNORMAL LOW (ref 98–111)
Creatinine: 0.82 mg/dL (ref 0.61–1.24)
GFR, Est AFR Am: >60 mL/min (ref >60)
GFR, Estimated: >60 mL/min (ref >60)
Glucose, Bld: 88 mg/dL (ref 70–99)
Potassium: 4.5 mmol/L (ref 3.5–5.1)
Sodium: 125 mmol/L — ABNORMAL LOW (ref 135–145)
Total Bilirubin: 0.3 mg/dL (ref 0.3–1.2)
Total Protein: 7.4 g/dL (ref 6.5–8.1)

## 2019-03-28 MED ORDER — SODIUM CHLORIDE 0.9 % IV SOLN
Freq: Once | INTRAVENOUS | Status: AC
  Administered 2019-03-28: 09:00:00 via INTRAVENOUS
  Filled 2019-03-28: qty 250

## 2019-03-28 MED ORDER — SODIUM CHLORIDE 0.9 % IV SOLN
9.4000 mg/kg | Freq: Once | INTRAVENOUS | Status: AC
  Administered 2019-03-28: 10:00:00 620 mg via INTRAVENOUS
  Filled 2019-03-28: qty 2.4

## 2019-03-28 NOTE — Patient Instructions (Addendum)
[facility] Discharge Instructions for Patients Receiving Chemotherapy  Today you received the following chemotherapy agents durvalumab (Imfinzi)  To help prevent nausea and vomiting after your treatment, we encourage you to take your nausea medication as directed  If you develop nausea and vomiting that is not controlled by your nausea medication, call the clinic.   BELOW ARE SYMPTOMS THAT SHOULD BE REPORTED IMMEDIATELY:  *FEVER GREATER THAN 100.5 F  *CHILLS WITH OR WITHOUT FEVER  NAUSEA AND VOMITING THAT IS NOT CONTROLLED WITH YOUR NAUSEA MEDICATION  *UNUSUAL SHORTNESS OF BREATH  *UNUSUAL BRUISING OR BLEEDING  TENDERNESS IN MOUTH AND THROAT WITH OR WITHOUT PRESENCE OF ULCERS  *URINARY PROBLEMS  *BOWEL PROBLEMS  UNUSUAL RASH Items with * indicate a potential emergency and should be followed up as soon as possible.  Feel free to call the clinic should you have any questions or concerns. The clinic phone number is [phone].  Please show the [facility] at check-in to the Emergency Department and triage nurse.  Durvalumab injection (Imfinzi) What is this medicine? DURVALUMAB (dur VAL ue mab) is a monoclonal antibody. It is used to treat urothelial cancer and lung cancer. This medicine may be used for other purposes; ask your health care provider or pharmacist if you have questions. COMMON BRAND NAME(S): IMFINZI What should I tell my health care provider before I take this medicine? They need to know if you have any of these conditions: -diabetes -immune system problems -infection -inflammatory bowel disease -kidney disease -liver disease -lung or breathing disease -lupus -organ transplant -stomach or intestine problems -thyroid disease -an unusual or allergic reaction to durvalumab, other medicines, foods, dyes, or preservatives -pregnant or trying to get pregnant -breast-feeding How should I use this medicine? This medicine is for  infusion into a vein. It is given by a health care professional in a hospital or clinic setting. A special MedGuide will be given to you before each treatment. Be sure to read this information carefully each time. Talk to your pediatrician regarding the use of this medicine in children. Special care may be needed. Overdosage: If you think you have taken too much of this medicine contact a poison control center or emergency room at once. NOTE: This medicine is only for you. Do not share this medicine with others. What if I miss a dose? It is important not to miss your dose. Call your doctor or health care professional if you are unable to keep an appointment. What may interact with this medicine? Interactions have not been studied. This list may not describe all possible interactions. Give your health care provider a list of all the medicines, herbs, non-prescription drugs, or dietary supplements you use. Also tell them if you smoke, drink alcohol, or use illegal drugs. Some items may interact with your medicine. What should I watch for while using this medicine? This drug may make you feel generally unwell. Continue your course of treatment even though you feel ill unless your doctor tells you to stop. You may need blood work done while you are taking this medicine. Do not become pregnant while taking this medicine or for 3 months after stopping it. Women should inform their doctor if they wish to become pregnant or think they might be pregnant. There is a potential for serious side effects to an unborn child. Talk to your health care professional or pharmacist for more information. Do not breast-feed an infant while taking this medicine or for 3 months after stopping it.  What side effects may I notice from receiving this medicine? Side effects that you should report to your doctor or health care professional as soon as possible: -allergic reactions like skin rash, itching or hives, swelling of the  face, lips, or tongue -black, tarry stools -bloody or watery diarrhea -breathing problems -change in emotions or moods -change in sex drive -changes in vision -chest pain or chest tightness -chills -confusion -cough -facial flushing -fever -headache -signs and symptoms of high blood sugar such as dizziness; dry mouth; dry skin; fruity breath; nausea; stomach pain; increased hunger or thirst; increased urination -signs and symptoms of liver injury like dark yellow or brown urine; general ill feeling or flu-like symptoms; light-colored stools; loss of appetite; nausea; right upper belly pain; unusually weak or tired; yellowing of the eyes or skin -stomach pain -trouble passing urine or change in the amount of urine -weight gain or weight loss Side effects that usually do not require medical attention (report these to your doctor or health care professional if they continue or are bothersome): -bone pain -constipation -loss of appetite -muscle pain -nausea -swelling of the ankles, feet, hands -tiredness This list may not describe all possible side effects. Call your doctor for medical advice about side effects. You may report side effects to FDA at 1-800-FDA-1088. Where should I keep my medicine? This drug is given in a hospital or clinic and will not be stored at home. NOTE: This sheet is a summary. It may not cover all possible information. If you have questions about this medicine, talk to your doctor, pharmacist, or health care provider.  2019 Elsevier/Gold Standard (2016-12-26 19:25:04)

## 2019-03-28 NOTE — Telephone Encounter (Signed)
Attempted to contact [name] regarding abnormal labs. Unable to reach him or sister at listed numbers.  He will be scheduled for repeat labs on 03/31/2019.

## 2019-03-31 ENCOUNTER — Telehealth: Payer: Self-pay | Admitting: Nurse Practitioner

## 2019-03-31 ENCOUNTER — Ambulatory Visit: Payer: Medicare Other

## 2019-03-31 ENCOUNTER — Telehealth: Payer: Self-pay | Admitting: *Deleted

## 2019-03-31 NOTE — Telephone Encounter (Signed)
-----   Message from Royston Bake, RN sent at 03/28/2019 12:29 PM EDT ----- Regarding: Dr. Benay Spice patient first time Imfinzi Dr. Benay Spice patient first time imfinizi

## 2019-03-31 NOTE — Telephone Encounter (Signed)
Called Ronald [name] for chemotherapy F/U.  Primary contact number reaches his sister Ronald [name].  "He lives five minutes away from me.  It is very hard for him to understand everything because he does not read or write.  He is doing well.  Experienced nausea and may have vomited once.  Has not c/o any new side effects or symptoms.  I believe his bowels and bladder is functioning well.  He is eating and drinking fluids.  I will check on him and call back if any thing is different.  I was told to have him come in tomorrow at 8:00 am for lab work.  Why does he need labs.  Lab was done before he received first treatment."   [name] will have him here for labs in am despite this nurse unable to answer why lab ordered tomorrow morning.  Instructed to drink 64 oz minimum daily or at least the day before, of and after treatment.   Denies further questions or needs at this time.    Encouraged to call [phone] Mon -Fri 8:00 am - 4:30 pm or anytime as needed for symptoms, changes or event outside office hours.

## 2019-03-31 NOTE — Telephone Encounter (Signed)
Called pt - no answer - left message with appt date and time per 5/29 sch message

## 2019-04-01 ENCOUNTER — Telehealth: Payer: Self-pay | Admitting: *Deleted

## 2019-04-01 ENCOUNTER — Ambulatory Visit: Payer: Medicare Other

## 2019-04-01 NOTE — Telephone Encounter (Signed)
Not able to make appointment today. Needs to reschedule. High priority scheduling message sent.

## 2019-04-02 ENCOUNTER — Telehealth: Payer: Self-pay | Admitting: *Deleted

## 2019-04-02 NOTE — Telephone Encounter (Signed)
Sister, [name] called back and said she can bring him for lab on 6/4 anytime after 3pm. Called back and left her VM with lab for 04/03/19 at 3:15 pm

## 2019-04-03 ENCOUNTER — Inpatient Hospital Stay: Payer: Medicare Other | Attending: Oncology

## 2019-04-03 DIAGNOSIS — K7460 Unspecified cirrhosis of liver: Secondary | ICD-10-CM | POA: Insufficient documentation

## 2019-04-03 DIAGNOSIS — Z8611 Personal history of tuberculosis: Secondary | ICD-10-CM | POA: Insufficient documentation

## 2019-04-03 DIAGNOSIS — J449 Chronic obstructive pulmonary disease, unspecified: Secondary | ICD-10-CM | POA: Insufficient documentation

## 2019-04-03 DIAGNOSIS — J90 Pleural effusion, not elsewhere classified: Secondary | ICD-10-CM | POA: Insufficient documentation

## 2019-04-03 DIAGNOSIS — D649 Anemia, unspecified: Secondary | ICD-10-CM | POA: Insufficient documentation

## 2019-04-03 DIAGNOSIS — Z5112 Encounter for antineoplastic immunotherapy: Secondary | ICD-10-CM | POA: Insufficient documentation

## 2019-04-03 DIAGNOSIS — C3432 Malignant neoplasm of lower lobe, left bronchus or lung: Secondary | ICD-10-CM | POA: Insufficient documentation

## 2019-04-04 ENCOUNTER — Telehealth: Payer: Self-pay | Admitting: Oncology

## 2019-04-04 NOTE — Telephone Encounter (Signed)
Called patient per 6/4 sch message - no answer .left message for pt to call back to reschedule appt

## 2019-04-06 ENCOUNTER — Other Ambulatory Visit: Payer: Self-pay | Admitting: Oncology

## 2019-04-10 ENCOUNTER — Ambulatory Visit: Payer: Medicare Other | Admitting: Pulmonary Disease

## 2019-04-11 ENCOUNTER — Other Ambulatory Visit: Payer: Self-pay

## 2019-04-11 ENCOUNTER — Inpatient Hospital Stay: Payer: Medicare Other

## 2019-04-11 ENCOUNTER — Inpatient Hospital Stay (HOSPITAL_BASED_OUTPATIENT_CLINIC_OR_DEPARTMENT_OTHER): Payer: Medicare Other | Admitting: Nurse Practitioner

## 2019-04-11 ENCOUNTER — Encounter: Payer: Self-pay | Admitting: Nurse Practitioner

## 2019-04-11 VITALS — BP 135/71 | HR 96 | Temp 98.5°F | Resp 18 | Ht 64.0 in | Wt 141.3 lb

## 2019-04-11 DIAGNOSIS — Z8611 Personal history of tuberculosis: Secondary | ICD-10-CM

## 2019-04-11 DIAGNOSIS — C3432 Malignant neoplasm of lower lobe, left bronchus or lung: Secondary | ICD-10-CM | POA: Diagnosis present

## 2019-04-11 DIAGNOSIS — Z5112 Encounter for antineoplastic immunotherapy: Secondary | ICD-10-CM | POA: Diagnosis present

## 2019-04-11 DIAGNOSIS — D649 Anemia, unspecified: Secondary | ICD-10-CM | POA: Diagnosis not present

## 2019-04-11 DIAGNOSIS — J90 Pleural effusion, not elsewhere classified: Secondary | ICD-10-CM | POA: Diagnosis not present

## 2019-04-11 DIAGNOSIS — J449 Chronic obstructive pulmonary disease, unspecified: Secondary | ICD-10-CM | POA: Diagnosis not present

## 2019-04-11 DIAGNOSIS — K7460 Unspecified cirrhosis of liver: Secondary | ICD-10-CM

## 2019-04-11 LAB — CMP (CANCER CENTER ONLY)
ALT: 54 U/L — ABNORMAL HIGH (ref 0–44)
AST: 104 U/L — ABNORMAL HIGH (ref 15–41)
Albumin: 3.7 g/dL (ref 3.5–5.0)
Alkaline Phosphatase: 139 U/L — ABNORMAL HIGH (ref 38–126)
Anion gap: 11 (ref 5–15)
BUN: 7 mg/dL — ABNORMAL LOW (ref 8–23)
CO2: 23 mmol/L (ref 22–32)
Calcium: 9.9 mg/dL (ref 8.9–10.3)
Chloride: 96 mmol/L — ABNORMAL LOW (ref 98–111)
Creatinine: 0.76 mg/dL (ref 0.61–1.24)
GFR, Est AFR Am: >60 mL/min (ref >60)
GFR, Estimated: >60 mL/min (ref >60)
Glucose, Bld: 110 mg/dL — ABNORMAL HIGH (ref 70–99)
Potassium: 4.1 mmol/L (ref 3.5–5.1)
Sodium: 130 mmol/L — ABNORMAL LOW (ref 135–145)
Total Bilirubin: 0.5 mg/dL (ref 0.3–1.2)
Total Protein: 7.7 g/dL (ref 6.5–8.1)

## 2019-04-11 MED ORDER — SODIUM CHLORIDE 0.9 % IV SOLN
Freq: Once | INTRAVENOUS | Status: AC
  Administered 2019-04-11: 12:00:00 via INTRAVENOUS
  Filled 2019-04-11: qty 250

## 2019-04-11 MED ORDER — SODIUM CHLORIDE 0.9 % IV SOLN
9.4000 mg/kg | Freq: Once | INTRAVENOUS | Status: AC
  Administered 2019-04-11: 620 mg via INTRAVENOUS
  Filled 2019-04-11: qty 2.4

## 2019-04-11 NOTE — Progress Notes (Signed)
  [facility] OFFICE PROGRESS NOTE   Diagnosis: Non-small cell lung cancer  INTERVAL HISTORY:   Ronald [name] returns as scheduled.  He completed cycle 1 durvalumab 03/28/2019.  He had a single episode of nausea.  No mouth sores.  No diarrhea.  No rash.  He has stable dyspnea on exertion and cough.  He attributes symptoms to COPD.  He denies fever.  He reports drinking 4 cans of beer every 3 to 4 days, most recently yesterday.  Objective:  Vital signs in last 24 hours:  Blood pressure 135/71, pulse 96, temperature 98.5 F (36.9 C), temperature source Oral, resp. rate 18, height 5\' 4"  (1.626 m), weight 141 lb 4.8 oz (64.1 kg), SpO2 97 %.    HEENT: No thrush or ulcers. Resp: Respirations even and unlabored. GI: Abdomen soft and nontender. Vascular: No leg edema. Skin: No rash.   Lab Results:  Lab Results  Component Value Date   WBC 4.9 03/28/2019   HGB 13.8 03/28/2019   HCT 38.3 (L) 03/28/2019   MCV 101.3 (H) 03/28/2019   PLT 125 (L) 03/28/2019   NEUTROABS 2.9 03/28/2019    Imaging:  No results found.  Medications: I have reviewed the patient's current medications.  Assessment/Plan: 1. Non-small cell lung cancer-squamous cell carcinoma, PDL 1 tumor proportion score 0%  CT chest 09/19/2018 concerning for an obstructing left lower lobe mass, small left pleural effusion, 4 mm right lower lobe nodule  CT head 09/20/2018-no acute abnormality  Bronchoscopy 10/02/2018-complete occlusion of the left lower lobe with an obstructing mass, biopsies, washing, and brushing reveal squamous cell carcinoma, biopsies of level 7 and 4L nodes-negative  PET scan 10/08/2018-hypermetabolic left hilar mass obstructing the left lower lobe bronchus with left lower lobe collapse, no evidence of metastatic lymphadenopathy or distant metastases  Clinical stage Ib (T2N0)  Radiation beginning 11/19/2018  Cycle 1 weekly Taxol/carboplatin 11/22/2018  Cycle 2 weekly Taxol/carboplatin  11/29/2018  Cycle 3 weekly Taxol/carboplatin2/04/2019  Cycle 4 weekly Taxol/carboplatin 12/13/2018  Cycle 5 weekly Taxol/carboplatin 12/20/2018  Cycle 6 weekly Taxol/carboplatin 12/27/2018  CT 02/24/2019- stable left lower lobe collapse, decreased left pleural effusion, abnormal soft tissue in the inferior left hilum  Cycle 1 durvalumab 03/28/2019  Cycle 2 durvalumab 04/11/2019 2.Tobacco and alcohol use 3.COPD 4.Weight loss 5.Anemia 6.History of tuberculosis 7.Changes of cirrhosis on the PET scan 10/08/2018   Disposition: Mr. Cosma appears stable.  He has completed 1 cycle of durvalumab.  Plan to proceed with cycle 2 today as scheduled.  We reviewed the chemistry panel from today.  He has mild elevation of liver enzymes, adequate to proceed with treatment today as scheduled.  He will discontinue alcohol use.  He will return for lab, follow-up, cycle 3 durvalumab in 2 weeks.  He will contact the office in the interim with any problems.  Plan reviewed with Dr. Benay Spice.    Ned Card ANP/GNP-BC   04/11/2019  11:11 AM

## 2019-04-11 NOTE — Progress Notes (Signed)
Okay to treat today without CBC, per Dr. Benay Spice.

## 2019-04-11 NOTE — Patient Instructions (Signed)
[facility] Discharge Instructions for Patients Receiving Chemotherapy  Today you received the following chemotherapy agents durvalumab (Imfinzi)  To help prevent nausea and vomiting after your treatment, we encourage you to take your nausea medication as directed  If you develop nausea and vomiting that is not controlled by your nausea medication, call the clinic.   BELOW ARE SYMPTOMS THAT SHOULD BE REPORTED IMMEDIATELY:  *FEVER GREATER THAN 100.5 F  *CHILLS WITH OR WITHOUT FEVER  NAUSEA AND VOMITING THAT IS NOT CONTROLLED WITH YOUR NAUSEA MEDICATION  *UNUSUAL SHORTNESS OF BREATH  *UNUSUAL BRUISING OR BLEEDING  TENDERNESS IN MOUTH AND THROAT WITH OR WITHOUT PRESENCE OF ULCERS  *URINARY PROBLEMS  *BOWEL PROBLEMS  UNUSUAL RASH Items with * indicate a potential emergency and should be followed up as soon as possible.  Feel free to call the clinic should you have any questions or concerns. The clinic phone number is [phone].  Please show the [facility] at check-in to the Emergency Department and triage nurse.  Durvalumab injection (Imfinzi) What is this medicine? DURVALUMAB (dur VAL ue mab) is a monoclonal antibody. It is used to treat urothelial cancer and lung cancer. This medicine may be used for other purposes; ask your health care provider or pharmacist if you have questions. COMMON BRAND NAME(S): IMFINZI What should I tell my health care provider before I take this medicine? They need to know if you have any of these conditions: -diabetes -immune system problems -infection -inflammatory bowel disease -kidney disease -liver disease -lung or breathing disease -lupus -organ transplant -stomach or intestine problems -thyroid disease -an unusual or allergic reaction to durvalumab, other medicines, foods, dyes, or preservatives -pregnant or trying to get pregnant -breast-feeding How should I use this medicine? This medicine is for  infusion into a vein. It is given by a health care professional in a hospital or clinic setting. A special MedGuide will be given to you before each treatment. Be sure to read this information carefully each time. Talk to your pediatrician regarding the use of this medicine in children. Special care may be needed. Overdosage: If you think you have taken too much of this medicine contact a poison control center or emergency room at once. NOTE: This medicine is only for you. Do not share this medicine with others. What if I miss a dose? It is important not to miss your dose. Call your doctor or health care professional if you are unable to keep an appointment. What may interact with this medicine? Interactions have not been studied. This list may not describe all possible interactions. Give your health care provider a list of all the medicines, herbs, non-prescription drugs, or dietary supplements you use. Also tell them if you smoke, drink alcohol, or use illegal drugs. Some items may interact with your medicine. What should I watch for while using this medicine? This drug may make you feel generally unwell. Continue your course of treatment even though you feel ill unless your doctor tells you to stop. You may need blood work done while you are taking this medicine. Do not become pregnant while taking this medicine or for 3 months after stopping it. Women should inform their doctor if they wish to become pregnant or think they might be pregnant. There is a potential for serious side effects to an unborn child. Talk to your health care professional or pharmacist for more information. Do not breast-feed an infant while taking this medicine or for 3 months after stopping it.  What side effects may I notice from receiving this medicine? Side effects that you should report to your doctor or health care professional as soon as possible: -allergic reactions like skin rash, itching or hives, swelling of the  face, lips, or tongue -black, tarry stools -bloody or watery diarrhea -breathing problems -change in emotions or moods -change in sex drive -changes in vision -chest pain or chest tightness -chills -confusion -cough -facial flushing -fever -headache -signs and symptoms of high blood sugar such as dizziness; dry mouth; dry skin; fruity breath; nausea; stomach pain; increased hunger or thirst; increased urination -signs and symptoms of liver injury like dark yellow or brown urine; general ill feeling or flu-like symptoms; light-colored stools; loss of appetite; nausea; right upper belly pain; unusually weak or tired; yellowing of the eyes or skin -stomach pain -trouble passing urine or change in the amount of urine -weight gain or weight loss Side effects that usually do not require medical attention (report these to your doctor or health care professional if they continue or are bothersome): -bone pain -constipation -loss of appetite -muscle pain -nausea -swelling of the ankles, feet, hands -tiredness This list may not describe all possible side effects. Call your doctor for medical advice about side effects. You may report side effects to FDA at 1-800-FDA-1088. Where should I keep my medicine? This drug is given in a hospital or clinic and will not be stored at home. NOTE: This sheet is a summary. It may not cover all possible information. If you have questions about this medicine, talk to your doctor, pharmacist, or health care provider.  2019 Elsevier/Gold Standard (2016-12-26 19:25:04)

## 2019-04-14 ENCOUNTER — Telehealth: Payer: Self-pay | Admitting: Nurse Practitioner

## 2019-04-14 NOTE — Telephone Encounter (Signed)
Scheduled appt per 6/12 los.  Printed and mailed calendar.

## 2019-04-18 ENCOUNTER — Other Ambulatory Visit: Payer: Self-pay | Admitting: Oncology

## 2019-04-22 ENCOUNTER — Other Ambulatory Visit: Payer: Self-pay | Admitting: Adult Health

## 2019-04-24 ENCOUNTER — Telehealth: Payer: Self-pay | Admitting: Nurse Practitioner

## 2019-04-24 NOTE — Telephone Encounter (Signed)
Called pt per 6/25 sch message- unable to reach pt - left message for patient to call back to reschedule.

## 2019-04-25 ENCOUNTER — Inpatient Hospital Stay: Payer: Medicare Other

## 2019-04-25 ENCOUNTER — Telehealth: Payer: Self-pay

## 2019-04-25 ENCOUNTER — Inpatient Hospital Stay: Payer: Medicare Other | Admitting: Nurse Practitioner

## 2019-04-25 NOTE — Telephone Encounter (Signed)
Left pt a vm msg advising that he missed today's appointment and wanted to make sure everything was ok.   Also Checked with front desk to see if pt arrived.

## 2019-04-28 ENCOUNTER — Telehealth: Payer: Self-pay | Admitting: *Deleted

## 2019-04-28 NOTE — Telephone Encounter (Signed)
Family member called and requested to reschedule his missed appointments of 04/25/19. High priority scheduling message sent.

## 2019-04-30 ENCOUNTER — Other Ambulatory Visit: Payer: Medicare Other

## 2019-04-30 ENCOUNTER — Inpatient Hospital Stay: Payer: Medicare Other

## 2019-04-30 ENCOUNTER — Inpatient Hospital Stay: Payer: Medicare Other | Attending: Oncology

## 2019-05-01 ENCOUNTER — Encounter: Payer: Self-pay | Admitting: Pulmonary Disease

## 2019-05-01 ENCOUNTER — Ambulatory Visit (INDEPENDENT_AMBULATORY_CARE_PROVIDER_SITE_OTHER): Payer: Medicare Other | Admitting: Pulmonary Disease

## 2019-05-01 ENCOUNTER — Telehealth: Payer: Self-pay | Admitting: *Deleted

## 2019-05-01 ENCOUNTER — Other Ambulatory Visit: Payer: Self-pay

## 2019-05-01 VITALS — BP 110/78 | HR 110 | Ht 64.0 in | Wt 138.4 lb

## 2019-05-01 DIAGNOSIS — R634 Abnormal weight loss: Secondary | ICD-10-CM

## 2019-05-01 DIAGNOSIS — E43 Unspecified severe protein-calorie malnutrition: Secondary | ICD-10-CM | POA: Diagnosis not present

## 2019-05-01 DIAGNOSIS — C3492 Malignant neoplasm of unspecified part of left bronchus or lung: Secondary | ICD-10-CM | POA: Diagnosis not present

## 2019-05-01 DIAGNOSIS — C3432 Malignant neoplasm of lower lobe, left bronchus or lung: Secondary | ICD-10-CM | POA: Diagnosis not present

## 2019-05-01 DIAGNOSIS — J449 Chronic obstructive pulmonary disease, unspecified: Secondary | ICD-10-CM | POA: Diagnosis not present

## 2019-05-01 MED ORDER — ALBUTEROL SULFATE HFA 108 (90 BASE) MCG/ACT IN AERS: INHALATION_SPRAY | RESPIRATORY_TRACT | 5 refills | Status: DC

## 2019-05-01 MED ORDER — ALBUTEROL SULFATE (2.5 MG/3ML) 0.083% IN NEBU: INHALATION_SOLUTION | RESPIRATORY_TRACT | 3 refills | Status: DC

## 2019-05-01 MED ORDER — STIOLTO RESPIMAT 2.5-2.5 MCG/ACT IN AERS: 2.0000 | INHALATION_SPRAY | Freq: Every day | RESPIRATORY_TRACT | 5 refills | Status: DC

## 2019-05-01 MED ORDER — STIOLTO RESPIMAT 2.5-2.5 MCG/ACT IN AERS: 2.0000 | INHALATION_SPRAY | Freq: Every day | RESPIRATORY_TRACT | 0 refills | Status: DC

## 2019-05-01 NOTE — Patient Instructions (Addendum)
Thank you for visiting Dr. Valeta Harms at [facility]. Today we recommend the following: No orders of the defined types were placed in this encounter.  Meds ordered this encounter  Medications  . albuterol (PROVENTIL) (2.5 MG/3ML) 0.083% nebulizer solution    Sig: TAKE 3ML BY MOUTH VIA NEBULIZER EVERY 4 HOURS AS NEEDED FOR WHEEZING OR SHORTNESS OF BREATH    Dispense:  75 mL    Refill:  3  . albuterol (VENTOLIN HFA) 108 (90 Base) MCG/ACT inhaler    Sig: INHALE 2 PUFFS BY MOUTH EVERY 6 HOURS AS NEEDED FOR SHORTNESS OF BREATH    Dispense:  18 g    Refill:  5  . Tiotropium Bromide-Olodaterol (STIOLTO RESPIMAT) 2.5-2.5 MCG/ACT AERS    Sig: Inhale 2 puffs into the lungs daily.    Dispense:  4 g    Refill:  5  . Tiotropium Bromide-Olodaterol (STIOLTO RESPIMAT) 2.5-2.5 MCG/ACT AERS    Sig: Inhale 2 puffs into the lungs daily.    Dispense:  2 g    Refill:  0    Order Specific Question:   Lot Number?    Answer:   [id number]   Return in about 6 months (around 11/01/2019).

## 2019-05-01 NOTE — Telephone Encounter (Signed)
VM from sister requesting to reschedule the missed chemo appointment of 04/30/2019. Called back and left her a voice mail to keep the appointments on 05/09/19 and treatment will be done at that time. This was approved by Dr. Benay Spice.

## 2019-05-01 NOTE — Progress Notes (Signed)
Synopsis: Referred in December 2019 for lower lobe lung mass by Dr. Halford Chessman.  Subjective:   PATIENT ID: Ronald [name] GENDER: male DOB: Jul 17, 1956, MRN: [id number]  Chief Complaint  Patient presents with  . Follow-up    He states no new concerns with his SOB. He states he has good days and bad days.     Patient was recently admitted to the hospital and found to have an obstructing left lower lobe mass. The patient was taken for bronchoscopy on 10/02/2018 for evaluation of the left lower lobe endobronchial lung mass and mediastinal adenopathy.  Patient underwent under endobronchial ultrasound-guided transbronchial needle aspiration biopsies of station 4L and 7 as well as forcep biopsies of the left lower lobe endobronchial mass, cytology washing, and brushings of the left lower lobe.  Both lymph nodes station 7 and 4L were negative for malignancy and to the left lower lobe endobronchial mass samplings were consistent with squamous cell carcinoma.  The patient's hospitalization he has quit smoking.  Patient had PET scan completed this morning which revealed a T2 N0 M0 disease with no evidence of metastatic focus.  PFTs were completed which reveals severe COPD, FEV1 1.37 L, 49% predicted, DLCO 59% predicted.  Currently managed with albuterol as needed.  Baseline functional status he is self-sufficient able to care for himself at this time.  Over the past couple weeks has been sick and a little debilitated.  Overall doing much better since hospitalization.  Patient tolerated bronchoscopy well.  Patient denies hemoptysis.  OV 05/01/2019: Since his diagnosis of lung cancer patient has been followed by [facility] closely.  Has underwent chemotherapy for clinical stage Ib (T2N0) with radiation in January 2020 followed by 6 cycles of Taxol carboplatinum.  Started on durvalumab in May 2020. Today, patient has been doing well.  He did state that once he started immunotherapy he felt sick afterwards.   He is not sure that he wants to continue that.  I told him to have that discussion with his oncologist.  As for his breathing he has been using his as needed albuterol.  He does state that [facility] is affordable and does make his breathing better.  He would like refills of this today.  We are happy to do so.  We also gave him some samples to help reduced in his pharmacy cost.  Overall he only has significant dyspnea with exertion.  Otherwise is able to complete his activities of daily living.  He states that he has still quit smoking.   Past Medical History:  Diagnosis Date  . Alcoholism ([place])   . Anemia    iron deficiency  . Collapse of left lung    lower lobe  . COPD (chronic obstructive pulmonary disease) ([place])   . Family history of adverse reaction to anesthesia    sister had a bad headache  . Hypertension   . Mediastinal adenopathy     and endobronchial lesion  . Pneumonia   . TB (pulmonary tuberculosis)   . Wears dentures      Family History  Problem Relation Age of Onset  . Heart disease Mother   . Heart disease Father   . Diabetes Other   . Hypertension Other   . Ovarian cancer Maternal Grandmother      Past Surgical History:  Procedure Laterality Date  . MULTIPLE TOOTH EXTRACTIONS    . VIDEO BRONCHOSCOPY WITH ENDOBRONCHIAL ULTRASOUND N/A 10/02/2018   Procedure: VIDEO BRONCHOSCOPY WITH ENDOBRONCHIAL ULTRASOUND;  Surgeon:  Garner Nash, DO;  Location: [facility] OR;  Service: Thoracic;  Laterality: N/A;    Social History   Socioeconomic History  . Marital status: Legally Separated    Spouse name: Not on file  . Number of children: Not on file  . Years of education: Not on file  . Highest education level: Not on file  Occupational History  . Not on file  Social Needs  . Financial resource strain: Not on file  . Food insecurity    Worry: Not on file    Inability: Not on file  . Transportation needs    Medical: Not on file    Non-medical: Not on file  Tobacco Use   . Smoking status: Former Smoker    Packs/day: 2.00    Types: Cigarettes    Quit date: 09/19/2018    Years since quitting: 0.6  . Smokeless tobacco: Current User    Types: Snuff  . Tobacco comment: none since admission 08/2018  Substance and Sexual Activity  . Alcohol use: Not Currently    Alcohol/week: 126.0 standard drinks    Types: 126 Cans of beer per week  . Drug use: No  . Sexual activity: Never  Lifestyle  . Physical activity    Days per week: Not on file    Minutes per session: Not on file  . Stress: Not on file  Relationships  . Social Herbalist on phone: Not on file    Gets together: Not on file    Attends religious service: Not on file    Active member of club or organization: Not on file    Attends meetings of clubs or organizations: Not on file    Relationship status: Not on file  . Intimate partner violence    Fear of current or ex partner: Not on file    Emotionally abused: Not on file    Physically abused: Not on file    Forced sexual activity: Not on file  Other Topics Concern  . Not on file  Social History Narrative  . Not on file     No Known Allergies   Outpatient Medications Prior to Visit  Medication Sig Dispense Refill  . feeding supplement, ENSURE ENLIVE, (ENSURE ENLIVE) LIQD Take 237 mLs by mouth 2 (two) times daily between meals. (Patient not taking: Reported on 04/11/2019) 237 mL 12  . ibuprofen (ADVIL,MOTRIN) 200 MG tablet Take 400 mg by mouth daily as needed for headache or moderate pain.    Marland Kitchen prochlorperazine (COMPAZINE) 10 MG tablet Take 1 tablet (10 mg total) by mouth every 6 (six) hours as needed for nausea. (Patient not taking: Reported on 02/27/2019) 60 tablet 1  . thiamine 100 MG tablet Take 1 tablet (100 mg total) by mouth daily. (Patient not taking: Reported on 04/11/2019) 30 tablet 0  . albuterol (PROVENTIL) (2.5 MG/3ML) 0.083% nebulizer solution TAKE 3ML BY MOUTH VIA NEBULIZER EVERY 4 HOURS AS NEEDED FOR WHEEZING OR  SHORTNESS OF BREATH (Patient not taking: Reported on 04/11/2019) 75 mL 3  . Tiotropium Bromide-Olodaterol (STIOLTO RESPIMAT) 2.5-2.5 MCG/ACT AERS Inhale 2 puffs into the lungs daily. (Patient not taking: Reported on 04/11/2019) 1 Inhaler 5  . VENTOLIN HFA 108 (90 Base) MCG/ACT inhaler INHALE 2 PUFFS BY MOUTH EVERY 6 HOURS AS NEEDED FOR SHORTNESS OF BREATH 18 g 1   No facility-administered medications prior to visit.     Review of Systems  Constitutional: Negative for chills, fever, malaise/fatigue and weight loss.  HENT:  Negative for hearing loss, sore throat and tinnitus.   Eyes: Negative for blurred vision and double vision.  Respiratory: Positive for cough and shortness of breath. Negative for hemoptysis, sputum production, wheezing and stridor.   Cardiovascular: Negative for chest pain, palpitations, orthopnea, leg swelling and PND.  Gastrointestinal: Negative for abdominal pain, constipation, diarrhea, heartburn, nausea and vomiting.  Genitourinary: Negative for dysuria, hematuria and urgency.  Musculoskeletal: Negative for joint pain and myalgias.  Skin: Negative for itching and rash.  Neurological: Negative for dizziness, tingling, weakness and headaches.  Endo/Heme/Allergies: Negative for environmental allergies. Does not bruise/bleed easily.  Psychiatric/Behavioral: Negative for depression. The patient is not nervous/anxious and does not have insomnia.   All other systems reviewed and are negative.   Objective:  Physical Exam Vitals signs reviewed.  Constitutional:      General: He is not in acute distress.    Appearance: He is well-developed.  HENT:     Head: Normocephalic and atraumatic.  Eyes:     General: No scleral icterus.    Conjunctiva/sclera: Conjunctivae normal.     Pupils: Pupils are equal, round, and reactive to light.  Neck:     Musculoskeletal: Neck supple.     Vascular: No JVD.     Trachea: No tracheal deviation.  Cardiovascular:     Rate and Rhythm:  Normal rate and regular rhythm.     Heart sounds: Normal heart sounds. No murmur.  Pulmonary:     Effort: Pulmonary effort is normal. No tachypnea, accessory muscle usage or respiratory distress.     Breath sounds: No stridor. Rhonchi present. No wheezing or rales.     Comments: Rhonchi in the base. Abdominal:     General: Bowel sounds are normal. There is no distension.     Palpations: Abdomen is soft.     Tenderness: There is no abdominal tenderness.  Musculoskeletal:        General: No tenderness.  Lymphadenopathy:     Cervical: No cervical adenopathy.  Skin:    General: Skin is warm and dry.     Capillary Refill: Capillary refill takes less than 2 seconds.     Findings: No rash.  Neurological:     Mental Status: He is alert and oriented to person, place, and time.  Psychiatric:        Behavior: Behavior normal.      Vitals:   05/01/19 1039  BP: 110/78  Pulse: (!) 110  SpO2: 97%  Weight: 138 lb 6.4 oz (62.8 kg)  Height: 5\' 4"  (1.626 m)   97% on RA BMI Readings from Last 3 Encounters:  05/01/19 23.76 kg/m  04/11/19 24.25 kg/m  03/18/19 25.13 kg/m   Wt Readings from Last 3 Encounters:  05/01/19 138 lb 6.4 oz (62.8 kg)  04/11/19 141 lb 4.8 oz (64.1 kg)  03/18/19 146 lb 6.4 oz (66.4 kg)     CBC    Component Value Date/Time   WBC 4.9 03/28/2019 0808   WBC 5.5 10/27/2018 2254   RBC 3.78 (L) 03/28/2019 0808   HGB 13.8 03/28/2019 0808   HCT 38.3 (L) 03/28/2019 0808   PLT 125 (L) 03/28/2019 0808   MCV 101.3 (H) 03/28/2019 0808   MCH 36.5 (H) 03/28/2019 0808   MCHC 36.0 03/28/2019 0808   RDW 10.6 (L) 03/28/2019 0808   LYMPHSABS 0.9 03/28/2019 0808   MONOABS 0.8 03/28/2019 0808   EOSABS 0.3 03/28/2019 0808   BASOSABS 0.0 03/28/2019 0808    Chest Imaging: 10/08/2018 pet imaging  No evidence of metastatic disease, T2 N0 M0 by pet imaging. The patient's images have been independently reviewed by me.    02/24/2019 CT chest: Persistent left lower lobe  collapse, left infrahilar mass stable.  No significant progression of disease. The patient's images have been independently reviewed by me.    Pulmonary Functions Testing Results: PFT Results Latest Ref Rng & Units 10/07/2018 09/23/2018  FVC-Pre L 2.52 -  FVC-Predicted Pre % 68 35  FVC-Post L 2.28 -  FVC-Predicted Post % 61 -  Pre FEV1/FVC % % 56 56  Post FEV1/FCV % % 60 -  FEV1-Pre L 1.41 0.74  FEV1-Predicted Pre % 50 26  FEV1-Post L 1.37 -  DLCO UNC% % 59 -  DLCO COR %Predicted % 93 -    FeNO: None   Pathology:    Bronchoscopy image: 100% occlusion of the LLL Tumor at the level of the left main subcarina   08/02/2018: Bronchoscopy pathology Cytology, washings, brushings and forcep biopsies of the left lower lobe endobronchial mass consistent with squamous cell carcinoma Station 4L and 7 negative for malignancy  Echocardiogram: None   Heart Catheterization: None     Assessment & Plan:     ICD-10-CM   1. Stage 3 severe COPD by GOLD classification ([place])  J44.9   2. Malignant neoplasm of lower lobe of left lung (HCC)  C34.32   3. Squamous cell carcinoma lung, left (HCC)  C34.92   4. Protein-calorie malnutrition, severe  E43   5. Weight loss  R63.4     Discussion:  This is a 63 year old gentleman with history of squamous cell carcinoma of the left lung status post chemo and radiation as well as stage III severe COPD.  Prior FEV1 of 1.73 L, 49% predicted, DLCO 59% predicted.  He also has a fully obstructed segment of the left lower lobe.  At this time patient has underwent chemo and radiation and followed closely by oncology. As for his COPD management he should continue the use of his Stiolto. He can also use albuterol inhaler as well as nebulizer as needed for shortness of breath and wheezing. Patient to follow-up in our clinic in 6 months for shortness of breath or as needed if symptoms worsen.  Greater than 50% of this patient's 15-minute of visit was spent  face-to-face discussing the recommendations treatment plan.  Refills provided.  We also reviewed patient's images from his post chemotherapy treatment and radiation.    Current Outpatient Medications:  .  albuterol (PROVENTIL) (2.5 MG/3ML) 0.083% nebulizer solution, TAKE 3ML BY MOUTH VIA NEBULIZER EVERY 4 HOURS AS NEEDED FOR WHEEZING OR SHORTNESS OF BREATH, Disp: 75 mL, Rfl: 3 .  albuterol (VENTOLIN HFA) 108 (90 Base) MCG/ACT inhaler, INHALE 2 PUFFS BY MOUTH EVERY 6 HOURS AS NEEDED FOR SHORTNESS OF BREATH, Disp: 18 g, Rfl: 5 .  feeding supplement, ENSURE ENLIVE, (ENSURE ENLIVE) LIQD, Take 237 mLs by mouth 2 (two) times daily between meals. (Patient not taking: Reported on 04/11/2019), Disp: 237 mL, Rfl: 12 .  ibuprofen (ADVIL,MOTRIN) 200 MG tablet, Take 400 mg by mouth daily as needed for headache or moderate pain., Disp: , Rfl:  .  prochlorperazine (COMPAZINE) 10 MG tablet, Take 1 tablet (10 mg total) by mouth every 6 (six) hours as needed for nausea. (Patient not taking: Reported on 02/27/2019), Disp: 60 tablet, Rfl: 1 .  thiamine 100 MG tablet, Take 1 tablet (100 mg total) by mouth daily. (Patient not taking: Reported on 04/11/2019), Disp: 30 tablet, Rfl:  0 .  Tiotropium Bromide-Olodaterol (STIOLTO RESPIMAT) 2.5-2.5 MCG/ACT AERS, Inhale 2 puffs into the lungs daily., Disp: 4 g, Rfl: 5   Garner Nash, DO [facility] 05/01/2019 11:13 AM

## 2019-05-04 ENCOUNTER — Other Ambulatory Visit: Payer: Self-pay | Admitting: Oncology

## 2019-05-05 ENCOUNTER — Telehealth: Payer: Self-pay | Admitting: *Deleted

## 2019-05-05 NOTE — Telephone Encounter (Signed)
Patient's sister called to report patient is cancelling appointments on Friday. He has decided to stop all treatment. He does not want to continue to pursue any treatment regardless of his families encouragement. Appts cancelled.   Patient would like to see Dr Benay Spice or Lattie Haw in 2 weeks to check in. [place] appt made as patient has requested. Message sent to Dr. Benay Spice to make aware.

## 2019-05-07 ENCOUNTER — Ambulatory Visit: Payer: Medicare Other | Admitting: Pulmonary Disease

## 2019-05-09 ENCOUNTER — Other Ambulatory Visit: Payer: Medicare Other

## 2019-05-09 ENCOUNTER — Ambulatory Visit: Payer: Medicare Other

## 2019-05-09 ENCOUNTER — Ambulatory Visit: Payer: Medicare Other | Admitting: Nurse Practitioner

## 2019-05-16 ENCOUNTER — Telehealth: Payer: Self-pay | Admitting: *Deleted

## 2019-05-16 ENCOUNTER — Inpatient Hospital Stay: Payer: Medicare Other | Admitting: Nurse Practitioner

## 2019-05-16 NOTE — Telephone Encounter (Addendum)
Patient had his sister call and cancel his appointments today. He told her he does not want to come in without her present because he can't understand or remember what he is told. Also has multiple IV sticks when he comes in and he is tired of this. Called sister back and patient has following questions: 1. On average, how many of the treatments are planned for him or someone with his diagnosis and status? 2. Would he benefit from a port? Per Dr. Benay Spice: Plan to administer every 2 weeks for 1 year. Hopefully FDA will soon approve to give monthly X 1 year. Yes, he would benefit from port. Left this information on sister's VM and requested a return call next week when they make a decision on how to proceed.

## 2019-09-12 ENCOUNTER — Ambulatory Visit: Payer: Medicare Other | Admitting: Pulmonary Disease

## 2019-10-08 ENCOUNTER — Encounter: Payer: Self-pay | Admitting: *Deleted

## 2019-12-04 ENCOUNTER — Other Ambulatory Visit: Payer: Self-pay | Admitting: *Deleted

## 2019-12-04 MED ORDER — STIOLTO RESPIMAT 2.5-2.5 MCG/ACT IN AERS: 2.0000 | INHALATION_SPRAY | Freq: Every day | RESPIRATORY_TRACT | 0 refills | Status: DC

## 2020-09-15 ENCOUNTER — Other Ambulatory Visit: Payer: Self-pay

## 2020-09-15 ENCOUNTER — Encounter: Payer: Self-pay | Admitting: Pulmonary Disease

## 2020-09-15 ENCOUNTER — Ambulatory Visit (INDEPENDENT_AMBULATORY_CARE_PROVIDER_SITE_OTHER): Payer: Medicare Other | Admitting: Pulmonary Disease

## 2020-09-15 VITALS — BP 112/60 | HR 113 | Temp 98.0°F | Wt 153.2 lb

## 2020-09-15 DIAGNOSIS — C3492 Malignant neoplasm of unspecified part of left bronchus or lung: Secondary | ICD-10-CM

## 2020-09-15 DIAGNOSIS — C3490 Malignant neoplasm of unspecified part of unspecified bronchus or lung: Secondary | ICD-10-CM | POA: Diagnosis not present

## 2020-09-15 DIAGNOSIS — Z87891 Personal history of nicotine dependence: Secondary | ICD-10-CM

## 2020-09-15 DIAGNOSIS — J449 Chronic obstructive pulmonary disease, unspecified: Secondary | ICD-10-CM

## 2020-09-15 DIAGNOSIS — F1020 Alcohol dependence, uncomplicated: Secondary | ICD-10-CM

## 2020-09-15 MED ORDER — STIOLTO RESPIMAT 2.5-2.5 MCG/ACT IN AERS: 2.0000 | INHALATION_SPRAY | Freq: Every day | RESPIRATORY_TRACT | 0 refills | Status: DC

## 2020-09-15 MED ORDER — ALBUTEROL SULFATE (2.5 MG/3ML) 0.083% IN NEBU: INHALATION_SOLUTION | RESPIRATORY_TRACT | 3 refills | Status: AC

## 2020-09-15 MED ORDER — ALBUTEROL SULFATE HFA 108 (90 BASE) MCG/ACT IN AERS
INHALATION_SPRAY | RESPIRATORY_TRACT | 5 refills | Status: DC
Start: 2020-09-15 — End: 2020-12-20

## 2020-09-15 NOTE — Progress Notes (Signed)
Synopsis: Referred in December 2019 for lower lobe lung mass by Dr. Halford Chessman.  Subjective:   PATIENT ID: Ronald [name] GENDER: male DOB: August 25, 1956, MRN: [id number]  Chief Complaint  Patient presents with  . Follow-up   Patient was recently admitted to the hospital and found to have an obstructing left lower lobe mass. The patient was taken for bronchoscopy on 10/02/2018 for evaluation of the left lower lobe endobronchial lung mass and mediastinal adenopathy.  Patient underwent under endobronchial ultrasound-guided transbronchial needle aspiration biopsies of station 4L and 7 as well as forcep biopsies of the left lower lobe endobronchial mass, cytology washing, and brushings of the left lower lobe.  Both lymph nodes station 7 and 4L were negative for malignancy and to the left lower lobe endobronchial mass samplings were consistent with squamous cell carcinoma.  The patient's hospitalization he has quit smoking.  Patient had PET scan completed this morning which revealed Ronald T2 N0 M0 disease with no evidence of metastatic focus.  PFTs were completed which reveals severe COPD, FEV1 1.37 L, 49% predicted, DLCO 59% predicted.  Currently managed with albuterol as needed.  Baseline functional status he is self-sufficient able to care for himself at this time.  Over the past couple weeks has been sick and Ronald little debilitated.  Overall doing much better since hospitalization.  Patient tolerated bronchoscopy well.  Patient denies hemoptysis.  OV 05/01/2019: Since his diagnosis of lung cancer patient has been followed by [facility] closely.  Has underwent chemotherapy for clinical stage Ib (T2N0) with radiation in January 2020 followed by 6 cycles of Taxol carboplatinum.  Started on durvalumab in May 2020. Today, patient has been doing well.  He did state that once he started immunotherapy he felt sick afterwards.  He is not sure that he wants to continue that.  I told him to have that discussion with  his oncologist.  As for his breathing he has been using his as needed albuterol.  He does state that [place] is affordable and does make his breathing better.  He would like refills of this today.  We are happy to do so.  We also gave him some samples to help reduced in his pharmacy cost.  Overall he only has significant dyspnea with exertion.  Otherwise is able to complete his activities of daily living.  He states that he has still quit smoking.  OV 09/15/2020: This is Ronald 64 year old [name] here for follow-up.  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  He had pulmonary function tests with Ronald reduced FEV1.  He had chemotherapy and radiation.  Follows with Dr. Benay Spice.  Patient was last seen by medical oncology in June 2020.  He was started on immunotherapy.  He did not do well with the immunotherapy and felt like it made him feel bad.  At the time he was also drinking 3-5 beers per day.  He had Ronald 0% PD-L1 expression.  He was started on Darvulumab.  He had 2 cycles of this.  And at that point per the patient he decided not to return to see the oncologist.  And subsequently has not had follow-up till seeing me in July 2020.  He is currently on Stiolto plus as needed albuterol for his COPD.  And has failed to follow-up for greater than Ronald year and now returns today to discuss next steps.   Past Medical History:  Diagnosis Date  .  Alcoholism ([place])   . Anemia    iron deficiency  . Collapse of left lung    lower lobe  . COPD (chronic obstructive pulmonary disease) ([place])   . Family history of adverse reaction to anesthesia    sister had Ronald bad headache  . Hypertension   . Mediastinal adenopathy     and endobronchial lesion  . Pneumonia   . TB (pulmonary tuberculosis)   . Wears dentures      Family History  Problem Relation Age of Onset  . Heart disease Mother   . Heart disease Father   . Diabetes Other   . Hypertension Other   . Ovarian cancer  Maternal Grandmother      Past Surgical History:  Procedure Laterality Date  . MULTIPLE TOOTH EXTRACTIONS    . VIDEO BRONCHOSCOPY WITH ENDOBRONCHIAL ULTRASOUND [name] 10/02/2018   Procedure: VIDEO BRONCHOSCOPY WITH ENDOBRONCHIAL ULTRASOUND;  Surgeon: Garner Nash, DO;  Location: [facility] OR;  Service: Thoracic;  Laterality: [name];    Social History   Socioeconomic History  . Marital status: Legally Separated    Spouse name: Not on file  . Number of children: Not on file  . Years of education: Not on file  . Highest education level: Not on file  Occupational History  . Not on file  Tobacco Use  . Smoking status: Former Smoker    Packs/day: 2.00    Types: Cigarettes    Quit date: 09/19/2018    Years since quitting: 1.9  . Smokeless tobacco: Current User    Types: Snuff  . Tobacco comment: none since admission 08/2018  Vaping Use  . Vaping Use: Never used  Substance and Sexual Activity  . Alcohol use: Not Currently    Alcohol/week: 126.0 standard drinks    Types: 126 Cans of beer per week  . Drug use: No  . Sexual activity: Never  Other Topics Concern  . Not on file  Social History Narrative  . Not on file   Social Determinants of Health   Financial Resource Strain:   . Difficulty of Paying Living Expenses: Not on file  Food Insecurity:   . Worried About Charity fundraiser in the Last Year: Not on file  . Ran Out of Food in the Last Year: Not on file  Transportation Needs:   . Lack of Transportation (Medical): Not on file  . Lack of Transportation (Non-Medical): Not on file  Physical Activity:   . Days of Exercise per Week: Not on file  . Minutes of Exercise per Session: Not on file  Stress:   . Feeling of Stress : Not on file  Social Connections:   . Frequency of Communication with Friends and Family: Not on file  . Frequency of Social Gatherings with Friends and Family: Not on file  . Attends Religious Services: Not on file  . Active Member of Clubs or  Organizations: Not on file  . Attends Archivist Meetings: Not on file  . Marital Status: Not on file  Intimate Partner Violence:   . Fear of Current or Ex-Partner: Not on file  . Emotionally Abused: Not on file  . Physically Abused: Not on file  . Sexually Abused: Not on file     No Known Allergies   Outpatient Medications Prior to Visit  Medication Sig Dispense Refill  . feeding supplement, ENSURE ENLIVE, (ENSURE ENLIVE) LIQD Take 237 mLs by mouth 2 (two) times daily between meals. 237 mL 12  .  ibuprofen (ADVIL,MOTRIN) 200 MG tablet Take 400 mg by mouth daily as needed for headache or moderate pain.    Marland Kitchen albuterol (PROVENTIL) (2.5 MG/3ML) 0.083% nebulizer solution TAKE 3ML BY MOUTH VIA NEBULIZER EVERY 4 HOURS AS NEEDED FOR WHEEZING OR SHORTNESS OF BREATH 75 mL 3  . albuterol (VENTOLIN HFA) 108 (90 Base) MCG/ACT inhaler INHALE 2 PUFFS BY MOUTH EVERY 6 HOURS AS NEEDED FOR SHORTNESS OF BREATH 18 g 5  . Tiotropium Bromide-Olodaterol (STIOLTO RESPIMAT) 2.5-2.5 MCG/ACT AERS Inhale 2 puffs into the lungs daily. 4 g 0  . prochlorperazine (COMPAZINE) 10 MG tablet Take 1 tablet (10 mg total) by mouth every 6 (six) hours as needed for nausea. (Patient not taking: Reported on 02/27/2019) 60 tablet 1  . thiamine 100 MG tablet Take 1 tablet (100 mg total) by mouth daily. (Patient not taking: Reported on 04/11/2019) 30 tablet 0   No facility-administered medications prior to visit.    Review of Systems  Constitutional: Negative for chills, fever, malaise/fatigue and weight loss.  HENT: Negative for hearing loss, sore throat and tinnitus.   Eyes: Negative for blurred vision and double vision.  Respiratory: Positive for cough and shortness of breath. Negative for hemoptysis, sputum production, wheezing and stridor.   Cardiovascular: Negative for chest pain, palpitations, orthopnea, leg swelling and PND.  Gastrointestinal: Negative for abdominal pain, constipation, diarrhea, heartburn, nausea  and vomiting.  Genitourinary: Negative for dysuria, hematuria and urgency.  Musculoskeletal: Negative for joint pain and myalgias.  Skin: Negative for itching and rash.  Neurological: Negative for dizziness, tingling, weakness and headaches.  Endo/Heme/Allergies: Negative for environmental allergies. Does not bruise/bleed easily.  Psychiatric/Behavioral: Negative for depression. The patient is not nervous/anxious and does not have insomnia.   All other systems reviewed and are negative.    Objective:   Physical Exam Constitutional:      Appearance: Normal appearance. He is not toxic-appearing.  HENT:     Head: Normocephalic.     Mouth/Throat:     Mouth: Mucous membranes are moist.  Eyes:     Extraocular Movements: Extraocular movements intact.     Pupils: Pupils are equal, round, and reactive to light.  Cardiovascular:     Rate and Rhythm: Normal rate.     Pulses: Normal pulses.     Heart sounds: Normal heart sounds. No murmur heard.  No gallop.   Pulmonary:     Effort: Pulmonary effort is normal. No respiratory distress.     Breath sounds: Normal breath sounds. No wheezing or rales.  Abdominal:     General: There is no distension.     Palpations: Abdomen is soft.  Musculoskeletal:     Cervical back: Neck supple.     Right lower leg: Edema present.     Left lower leg: Edema present.  Skin:    Coloration: Skin is not jaundiced.  Neurological:     General: No focal deficit present.     Mental Status: He is alert and oriented to person, place, and time.  Psychiatric:        Mood and Affect: Mood normal.        Behavior: Behavior normal.        Vitals:   09/15/20 1429  BP: 112/60  Pulse: (!) 113  Temp: 98 F (36.7 C)  TempSrc: Tympanic  SpO2: 96%  Weight: 153 lb 4 oz (69.5 kg)   96% on RA BMI Readings from Last 3 Encounters:  09/15/20 26.31 kg/m  05/01/19 23.76 kg/m  04/11/19  24.25 kg/m   Wt Readings from Last 3 Encounters:  09/15/20 153 lb 4 oz  (69.5 kg)  05/01/19 138 lb 6.4 oz (62.8 kg)  04/11/19 141 lb 4.8 oz (64.1 kg)     CBC    Component Value Date/Time   WBC 4.9 03/28/2019 0808   WBC 5.5 10/27/2018 2254   RBC 3.78 (L) 03/28/2019 0808   HGB 13.8 03/28/2019 0808   HCT 38.3 (L) 03/28/2019 0808   PLT 125 (L) 03/28/2019 0808   MCV 101.3 (H) 03/28/2019 0808   MCH 36.5 (H) 03/28/2019 0808   MCHC 36.0 03/28/2019 0808   RDW 10.6 (L) 03/28/2019 0808   LYMPHSABS 0.9 03/28/2019 0808   MONOABS 0.8 03/28/2019 0808   EOSABS 0.3 03/28/2019 0808   BASOSABS 0.0 03/28/2019 0808    Chest Imaging: 10/08/2018 pet imaging No evidence of metastatic disease, T2 N0 M0 by pet imaging. The patient's images have been independently reviewed by me.    02/24/2019 CT chest: Persistent left lower lobe collapse, left infrahilar mass stable.  No significant progression of disease. The patient's images have been independently reviewed by me.    Pulmonary Functions Testing Results: PFT Results Latest Ref Rng & Units 10/07/2018 09/23/2018  FVC-Pre L 2.52 -  FVC-Predicted Pre % 68 35  FVC-Post L 2.28 -  FVC-Predicted Post % 61 -  Pre FEV1/FVC % % 56 56  Post FEV1/FCV % % 60 -  FEV1-Pre L 1.41 0.74  FEV1-Predicted Pre % 50 26  FEV1-Post L 1.37 -  DLCO uncorrected ml/min/mmHg 14.53 -  DLCO [facility]% % 59 -  DLVA Predicted % 93 -    FeNO: None   Pathology:    Bronchoscopy image: 100% occlusion of the LLL Tumor at the level of the left main subcarina   08/02/2018: Bronchoscopy pathology Cytology, washings, brushings and forcep biopsies of the left lower lobe endobronchial mass consistent with squamous cell carcinoma Station 4L and 7 negative for malignancy  Echocardiogram: None   Heart Catheterization: None     Assessment & Plan:     ICD-10-CM   1. Stage 3 severe COPD by GOLD classification (HCC)  J44.9   2. Malignant neoplasm of unspecified part of unspecified bronchus or lung ([place])  C34.90 NM PET Image Initial (PI) Skull Base To  Thigh  3. Squamous cell carcinoma lung, left (HCC)  C34.92   4. Former smoker  Z87.891   5. Alcoholism ([place])  F10.20     Discussion:  This is Ronald 64 year old [name], history of T2 N0 M0 squamous cell carcinoma of the lung completed chemo plus radiation, stage III severe COPD FEV1 49% predicted, DLCO 59%.  Former smoker he is quit at this time.  His left lower lobe was fully obstructed due to this lesion on previous bronchoscopy.  Patient did not tolerate immunotherapy and made decision to no longer follow-up with oncology.  He has not seen [place] in over Ronald year.  Plan: I discussed what patient would like [place] to do today. He does have refill needs for his inhalers. Refills given today sent to his pharmacy. We did recommend to him having Ronald restaging PET scan.  This has been ordered. He states that if this has reoccurred there is concern for recurrence of his cancer that he may be open to consideration of treatments again. I told him that I would reach out to his oncologist to let him know that I am ordering Ronald repeat PET scan.   Current Outpatient Medications:  .  albuterol (PROVENTIL) (2.5 MG/3ML) 0.083% nebulizer solution, TAKE 3ML BY MOUTH VIA NEBULIZER EVERY 4 HOURS AS NEEDED FOR WHEEZING OR SHORTNESS OF BREATH, Disp: 75 mL, Rfl: 3 .  albuterol (VENTOLIN HFA) 108 (90 Base) MCG/ACT inhaler, INHALE 2 PUFFS BY MOUTH EVERY 6 HOURS AS NEEDED FOR SHORTNESS OF BREATH, Disp: 18 g, Rfl: 5 .  feeding supplement, ENSURE ENLIVE, (ENSURE ENLIVE) LIQD, Take 237 mLs by mouth 2 (two) times daily between meals., Disp: 237 mL, Rfl: 12 .  ibuprofen (ADVIL,MOTRIN) 200 MG tablet, Take 400 mg by mouth daily as needed for headache or moderate pain., Disp: , Rfl:  .  Tiotropium Bromide-Olodaterol (STIOLTO RESPIMAT) 2.5-2.5 MCG/ACT AERS, Inhale 2 puffs into the lungs daily., Disp: 4 g, Rfl: 0  This patient is critically ill with multiple organ system failure; which, requires frequent high complexity decision making,  assessment, support, evaluation, and titration of therapies. This was completed through the application of advanced monitoring technologies and extensive interpretation of multiple databases. During this encounter critical care time was devoted to patient care services described in this note for 40 minutes.   Garner Nash, DO [facility] 09/15/2020 3:09 PM

## 2020-09-15 NOTE — H&P (View-Only) (Signed)
Synopsis: Referred in December 2019 for lower lobe lung mass by Dr. Halford Chessman.  Subjective:   PATIENT ID: Ronald [name] GENDER: male DOB: April 06, 1956, MRN: [id number]  Chief Complaint  Patient presents with  . Follow-up   Patient was recently admitted to the hospital and found to have an obstructing left lower lobe mass. The patient was taken for bronchoscopy on 10/02/2018 for evaluation of the left lower lobe endobronchial lung mass and mediastinal adenopathy.  Patient underwent under endobronchial ultrasound-guided transbronchial needle aspiration biopsies of station 4L and 7 as well as forcep biopsies of the left lower lobe endobronchial mass, cytology washing, and brushings of the left lower lobe.  Both lymph nodes station 7 and 4L were negative for malignancy and to the left lower lobe endobronchial mass samplings were consistent with squamous cell carcinoma.  The patient's hospitalization he has quit smoking.  Patient had PET scan completed this morning which revealed a T2 N0 M0 disease with no evidence of metastatic focus.  PFTs were completed which reveals severe COPD, FEV1 1.37 L, 49% predicted, DLCO 59% predicted.  Currently managed with albuterol as needed.  Baseline functional status he is self-sufficient able to care for himself at this time.  Over the past couple weeks has been sick and a little debilitated.  Overall doing much better since hospitalization.  Patient tolerated bronchoscopy well.  Patient denies hemoptysis.  OV 05/01/2019: Since his diagnosis of lung cancer patient has been followed by [place] closely.  Has underwent chemotherapy for clinical stage Ib (T2N0) with radiation in January 2020 followed by 6 cycles of Taxol carboplatinum.  Started on durvalumab in May 2020. Today, patient has been doing well.  He did state that once he started immunotherapy he felt sick afterwards.  He is not sure that he wants to continue that.  I told him to have that discussion with  his oncologist.  As for his breathing he has been using his as needed albuterol.  He does state that [place] is affordable and does make his breathing better.  He would like refills of this today.  We are happy to do so.  We also gave him some samples to help reduced in his pharmacy cost.  Overall he only has significant dyspnea with exertion.  Otherwise is able to complete his activities of daily living.  He states that he has still quit smoking.  OV 09/15/2020: This is a 64 year old gentleman here for follow-up.  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  He had pulmonary function tests with a reduced FEV1.  He had chemotherapy and radiation.  Follows with Dr. Benay Spice.  Patient was last seen by medical oncology in June 2020.  He was started on immunotherapy.  He did not do well with the immunotherapy and felt like it made him feel bad.  At the time he was also drinking 3-5 beers per day.  He had a 0% PD-L1 expression.  He was started on Darvulumab.  He had 2 cycles of this.  And at that point per the patient he decided not to return to see the oncologist.  And subsequently has not had follow-up till seeing me in July 2020.  He is currently on Stiolto plus as needed albuterol for his COPD.  And has failed to follow-up for greater than a year and now returns today to discuss next steps.   Past Medical History:  Diagnosis Date  .  Alcoholism ([place])   . Anemia    iron deficiency  . Collapse of left lung    lower lobe  . COPD (chronic obstructive pulmonary disease) ([place])   . Family history of adverse reaction to anesthesia    sister had a bad headache  . Hypertension   . Mediastinal adenopathy     and endobronchial lesion  . Pneumonia   . TB (pulmonary tuberculosis)   . Wears dentures      Family History  Problem Relation Age of Onset  . Heart disease Mother   . Heart disease Father   . Diabetes Other   . Hypertension Other   . Ovarian cancer  Maternal Grandmother      Past Surgical History:  Procedure Laterality Date  . MULTIPLE TOOTH EXTRACTIONS    . VIDEO BRONCHOSCOPY WITH ENDOBRONCHIAL ULTRASOUND N/A 10/02/2018   Procedure: VIDEO BRONCHOSCOPY WITH ENDOBRONCHIAL ULTRASOUND;  Surgeon: Garner Nash, DO;  Location: [facility] OR;  Service: Thoracic;  Laterality: N/A;    Social History   Socioeconomic History  . Marital status: Legally Separated    Spouse name: Not on file  . Number of children: Not on file  . Years of education: Not on file  . Highest education level: Not on file  Occupational History  . Not on file  Tobacco Use  . Smoking status: Former Smoker    Packs/day: 2.00    Types: Cigarettes    Quit date: 09/19/2018    Years since quitting: 1.9  . Smokeless tobacco: Current User    Types: Snuff  . Tobacco comment: none since admission 08/2018  Vaping Use  . Vaping Use: Never used  Substance and Sexual Activity  . Alcohol use: Not Currently    Alcohol/week: 126.0 standard drinks    Types: 126 Cans of beer per week  . Drug use: No  . Sexual activity: Never  Other Topics Concern  . Not on file  Social History Narrative  . Not on file   Social Determinants of Health   Financial Resource Strain:   . Difficulty of Paying Living Expenses: Not on file  Food Insecurity:   . Worried About Charity fundraiser in the Last Year: Not on file  . Ran Out of Food in the Last Year: Not on file  Transportation Needs:   . Lack of Transportation (Medical): Not on file  . Lack of Transportation (Non-Medical): Not on file  Physical Activity:   . Days of Exercise per Week: Not on file  . Minutes of Exercise per Session: Not on file  Stress:   . Feeling of Stress : Not on file  Social Connections:   . Frequency of Communication with Friends and Family: Not on file  . Frequency of Social Gatherings with Friends and Family: Not on file  . Attends Religious Services: Not on file  . Active Member of Clubs or  Organizations: Not on file  . Attends Archivist Meetings: Not on file  . Marital Status: Not on file  Intimate Partner Violence:   . Fear of Current or Ex-Partner: Not on file  . Emotionally Abused: Not on file  . Physically Abused: Not on file  . Sexually Abused: Not on file     No Known Allergies   Outpatient Medications Prior to Visit  Medication Sig Dispense Refill  . feeding supplement, ENSURE ENLIVE, (ENSURE ENLIVE) LIQD Take 237 mLs by mouth 2 (two) times daily between meals. 237 mL 12  .  ibuprofen (ADVIL,MOTRIN) 200 MG tablet Take 400 mg by mouth daily as needed for headache or moderate pain.    Marland Kitchen albuterol (PROVENTIL) (2.5 MG/3ML) 0.083% nebulizer solution TAKE 3ML BY MOUTH VIA NEBULIZER EVERY 4 HOURS AS NEEDED FOR WHEEZING OR SHORTNESS OF BREATH 75 mL 3  . albuterol (VENTOLIN HFA) 108 (90 Base) MCG/ACT inhaler INHALE 2 PUFFS BY MOUTH EVERY 6 HOURS AS NEEDED FOR SHORTNESS OF BREATH 18 g 5  . Tiotropium Bromide-Olodaterol (STIOLTO RESPIMAT) 2.5-2.5 MCG/ACT AERS Inhale 2 puffs into the lungs daily. 4 g 0  . prochlorperazine (COMPAZINE) 10 MG tablet Take 1 tablet (10 mg total) by mouth every 6 (six) hours as needed for nausea. (Patient not taking: Reported on 02/27/2019) 60 tablet 1  . thiamine 100 MG tablet Take 1 tablet (100 mg total) by mouth daily. (Patient not taking: Reported on 04/11/2019) 30 tablet 0   No facility-administered medications prior to visit.    Review of Systems  Constitutional: Negative for chills, fever, malaise/fatigue and weight loss.  HENT: Negative for hearing loss, sore throat and tinnitus.   Eyes: Negative for blurred vision and double vision.  Respiratory: Positive for cough and shortness of breath. Negative for hemoptysis, sputum production, wheezing and stridor.   Cardiovascular: Negative for chest pain, palpitations, orthopnea, leg swelling and PND.  Gastrointestinal: Negative for abdominal pain, constipation, diarrhea, heartburn, nausea  and vomiting.  Genitourinary: Negative for dysuria, hematuria and urgency.  Musculoskeletal: Negative for joint pain and myalgias.  Skin: Negative for itching and rash.  Neurological: Negative for dizziness, tingling, weakness and headaches.  Endo/Heme/Allergies: Negative for environmental allergies. Does not bruise/bleed easily.  Psychiatric/Behavioral: Negative for depression. The patient is not nervous/anxious and does not have insomnia.   All other systems reviewed and are negative.    Objective:   Physical Exam Constitutional:      Appearance: Normal appearance. He is not toxic-appearing.  HENT:     Head: Normocephalic.     Mouth/Throat:     Mouth: Mucous membranes are moist.  Eyes:     Extraocular Movements: Extraocular movements intact.     Pupils: Pupils are equal, round, and reactive to light.  Cardiovascular:     Rate and Rhythm: Normal rate.     Pulses: Normal pulses.     Heart sounds: Normal heart sounds. No murmur heard.  No gallop.   Pulmonary:     Effort: Pulmonary effort is normal. No respiratory distress.     Breath sounds: Normal breath sounds. No wheezing or rales.  Abdominal:     General: There is no distension.     Palpations: Abdomen is soft.  Musculoskeletal:     Cervical back: Neck supple.     Right lower leg: Edema present.     Left lower leg: Edema present.  Skin:    Coloration: Skin is not jaundiced.  Neurological:     General: No focal deficit present.     Mental Status: He is alert and oriented to person, place, and time.  Psychiatric:        Mood and Affect: Mood normal.        Behavior: Behavior normal.        Vitals:   09/15/20 1429  BP: 112/60  Pulse: (!) 113  Temp: 98 F (36.7 C)  TempSrc: Tympanic  SpO2: 96%  Weight: 153 lb 4 oz (69.5 kg)   96% on RA BMI Readings from Last 3 Encounters:  09/15/20 26.31 kg/m  05/01/19 23.76 kg/m  04/11/19  24.25 kg/m   Wt Readings from Last 3 Encounters:  09/15/20 153 lb 4 oz  (69.5 kg)  05/01/19 138 lb 6.4 oz (62.8 kg)  04/11/19 141 lb 4.8 oz (64.1 kg)     CBC    Component Value Date/Time   WBC 4.9 03/28/2019 0808   WBC 5.5 10/27/2018 2254   RBC 3.78 (L) 03/28/2019 0808   HGB 13.8 03/28/2019 0808   HCT 38.3 (L) 03/28/2019 0808   PLT 125 (L) 03/28/2019 0808   MCV 101.3 (H) 03/28/2019 0808   MCH 36.5 (H) 03/28/2019 0808   MCHC 36.0 03/28/2019 0808   RDW 10.6 (L) 03/28/2019 0808   LYMPHSABS 0.9 03/28/2019 0808   MONOABS 0.8 03/28/2019 0808   EOSABS 0.3 03/28/2019 0808   BASOSABS 0.0 03/28/2019 0808    Chest Imaging: 10/08/2018 pet imaging No evidence of metastatic disease, T2 N0 M0 by pet imaging. The patient's images have been independently reviewed by me.    02/24/2019 CT chest: Persistent left lower lobe collapse, left infrahilar mass stable.  No significant progression of disease. The patient's images have been independently reviewed by me.    Pulmonary Functions Testing Results: PFT Results Latest Ref Rng & Units 10/07/2018 09/23/2018  FVC-Pre L 2.52 -  FVC-Predicted Pre % 68 35  FVC-Post L 2.28 -  FVC-Predicted Post % 61 -  Pre FEV1/FVC % % 56 56  Post FEV1/FCV % % 60 -  FEV1-Pre L 1.41 0.74  FEV1-Predicted Pre % 50 26  FEV1-Post L 1.37 -  DLCO uncorrected ml/min/mmHg 14.53 -  DLCO UNC% % 59 -  DLVA Predicted % 93 -    FeNO: None   Pathology:    Bronchoscopy image: 100% occlusion of the LLL Tumor at the level of the left main subcarina   08/02/2018: Bronchoscopy pathology Cytology, washings, brushings and forcep biopsies of the left lower lobe endobronchial mass consistent with squamous cell carcinoma Station 4L and 7 negative for malignancy  Echocardiogram: None   Heart Catheterization: None     Assessment & Plan:     ICD-10-CM   1. Stage 3 severe COPD by GOLD classification (HCC)  J44.9   2. Malignant neoplasm of unspecified part of unspecified bronchus or lung ([place])  C34.90 NM PET Image Initial (PI) Skull Base To  Thigh  3. Squamous cell carcinoma lung, left (HCC)  C34.92   4. Former smoker  Z87.891   5. Alcoholism ([place])  F10.20     Discussion:  This is a 64 year old gentleman, history of T2 N0 M0 squamous cell carcinoma of the lung completed chemo plus radiation, stage III severe COPD FEV1 49% predicted, DLCO 59%.  Former smoker he is quit at this time.  His left lower lobe was fully obstructed due to this lesion on previous bronchoscopy.  Patient did not tolerate immunotherapy and made decision to no longer follow-up with oncology.  He has not seen [place] in over a year.  Plan: I discussed what patient would like [place] to do today. He does have refill needs for his inhalers. Refills given today sent to his pharmacy. We did recommend to him having a restaging PET scan.  This has been ordered. He states that if this has reoccurred there is concern for recurrence of his cancer that he may be open to consideration of treatments again. I told him that I would reach out to his oncologist to let him know that I am ordering a repeat PET scan.   Current Outpatient Medications:  .  albuterol (PROVENTIL) (2.5 MG/3ML) 0.083% nebulizer solution, TAKE 3ML BY MOUTH VIA NEBULIZER EVERY 4 HOURS AS NEEDED FOR WHEEZING OR SHORTNESS OF BREATH, Disp: 75 mL, Rfl: 3 .  albuterol (VENTOLIN HFA) 108 (90 Base) MCG/ACT inhaler, INHALE 2 PUFFS BY MOUTH EVERY 6 HOURS AS NEEDED FOR SHORTNESS OF BREATH, Disp: 18 g, Rfl: 5 .  feeding supplement, ENSURE ENLIVE, (ENSURE ENLIVE) LIQD, Take 237 mLs by mouth 2 (two) times daily between meals., Disp: 237 mL, Rfl: 12 .  ibuprofen (ADVIL,MOTRIN) 200 MG tablet, Take 400 mg by mouth daily as needed for headache or moderate pain., Disp: , Rfl:  .  Tiotropium Bromide-Olodaterol (STIOLTO RESPIMAT) 2.5-2.5 MCG/ACT AERS, Inhale 2 puffs into the lungs daily., Disp: 4 g, Rfl: 0  This patient is critically ill with multiple organ system failure; which, requires frequent high complexity decision making,  assessment, support, evaluation, and titration of therapies. This was completed through the application of advanced monitoring technologies and extensive interpretation of multiple databases. During this encounter critical care time was devoted to patient care services described in this note for 40 minutes.   Garner Nash, DO [facility] 09/15/2020 3:09 PM

## 2020-09-15 NOTE — Patient Instructions (Addendum)
Thank you for visiting Dr. Valeta Harms at [facility]. Today we recommend the following:  Orders Placed This Encounter  Procedures  . NM PET Image Initial (PI) Skull Base To Thigh   Meds ordered this encounter  Medications  . albuterol (PROVENTIL) (2.5 MG/3ML) 0.083% nebulizer solution    Sig: TAKE 3ML BY MOUTH VIA NEBULIZER EVERY 4 HOURS AS NEEDED FOR WHEEZING OR SHORTNESS OF BREATH    Dispense:  75 mL    Refill:  3  . albuterol (VENTOLIN HFA) 108 (90 Base) MCG/ACT inhaler    Sig: INHALE 2 PUFFS BY MOUTH EVERY 6 HOURS AS NEEDED FOR SHORTNESS OF BREATH    Dispense:  18 g    Refill:  5  . Tiotropium Bromide-Olodaterol (STIOLTO RESPIMAT) 2.5-2.5 MCG/ACT AERS    Sig: Inhale 2 puffs into the lungs daily.    Dispense:  4 g    Refill:  0    MUST HAVE OV FOR FURTHER REFILLS    Order Specific Question:   Lot Number?    Answer:   [id number]   Return in about 4 weeks (around 10/13/2020). To see Garner Nash, DO or Wyn Quaker, NP to discuss next steps.     Please do your part to reduce the spread of COVID-19.

## 2020-09-16 ENCOUNTER — Telehealth: Payer: Self-pay | Admitting: *Deleted

## 2020-09-16 NOTE — Telephone Encounter (Signed)
Left VM w/sister, [name] that patient has been lost to follow up. Dr. Benay Spice would like to see him after his PET scan. Scheduled for 09/14/20 at 10 am, same day as his pulmonary appointment to limit travel. Left appointment information on her voice mail and requested a return call to confirm.

## 2020-09-30 ENCOUNTER — Other Ambulatory Visit: Payer: Self-pay

## 2020-09-30 ENCOUNTER — Telehealth: Payer: Self-pay | Admitting: Pulmonary Disease

## 2020-09-30 ENCOUNTER — Ambulatory Visit (HOSPITAL_COMMUNITY)
Admission: RE | Admit: 2020-09-30 | Discharge: 2020-09-30 | Disposition: A | Discharge status: Home / Self Care | Payer: Medicare Other | Source: Ambulatory Visit | Attending: Pulmonary Disease | Admitting: Pulmonary Disease

## 2020-09-30 DIAGNOSIS — C3492 Malignant neoplasm of unspecified part of left bronchus or lung: Secondary | ICD-10-CM | POA: Insufficient documentation

## 2020-09-30 DIAGNOSIS — I700 Atherosclerosis of aorta: Secondary | ICD-10-CM | POA: Diagnosis not present

## 2020-09-30 DIAGNOSIS — R918 Other nonspecific abnormal finding of lung field: Secondary | ICD-10-CM

## 2020-09-30 DIAGNOSIS — C3490 Malignant neoplasm of unspecified part of unspecified bronchus or lung: Secondary | ICD-10-CM | POA: Diagnosis not present

## 2020-09-30 LAB — GLUCOSE, CAPILLARY: Glucose-Capillary: 127 mg/dL — ABNORMAL HIGH (ref 70–99)

## 2020-09-30 MED ORDER — FLUDEOXYGLUCOSE F - 18 (FDG) INJECTION
7.6000 | Freq: Once | INTRAVENOUS | Status: AC | PRN
  Administered 2020-09-30: 7.6 via INTRAVENOUS

## 2020-09-30 NOTE — Telephone Encounter (Signed)
Defer to Dr. Valeta Harms

## 2020-09-30 NOTE — Telephone Encounter (Signed)
Called  imaging and spoke with Malachy Mood:  IMPRESSION: 1. Residual/recurrent disease about the LEFT hilum in the chest in this patient with history of bronchogenic neoplasm in this location, slightly less metabolic activity and size of area of abnormal metabolic activity when compared to previous imaging.  Dr. Elsworth Soho,  I am just making you aware as Dr. Valeta Harms is off today and this was a call report.  I am also routing to Dr. Valeta Harms.  Thank you.

## 2020-10-01 NOTE — Telephone Encounter (Signed)
PCCM:  I called and spoke with the patient regarding his recent CT chest. It appears that he has recurrence of disease with area of collapse suggesting endobronchial disease   We will plan for bronchoscopy with biopsy and possible tumor debulking.   He would like for me to to call his sister and inform her of the plans  Tentative bronchoscopy day on 10/12/2020.  Orders have been placed.  Attention: Patient's sister helps arrange transportation for the patient.  Garner Nash, DO [facility] 10/01/2020 10:59 AM

## 2020-10-01 NOTE — Telephone Encounter (Signed)
Pt has been scheduled for 12/14 at 7:30 at [facility].  Covid test scheduled for 12/11 at 9:35.  I have left a vm for sister to call me back for appt info.

## 2020-10-04 NOTE — Telephone Encounter (Signed)
I have left another vm for pt's sister to call me for appt info.  Tried to call pt & phone just rings and then I get a recording to enter access code.

## 2020-10-04 NOTE — Telephone Encounter (Signed)
I have spoken to pt's sister [name] and gave her appt info.

## 2020-10-09 ENCOUNTER — Other Ambulatory Visit (HOSPITAL_COMMUNITY)
Admission: RE | Admit: 2020-10-09 | Discharge: 2020-10-09 | Disposition: A | Discharge status: Home / Self Care | Payer: Medicare Other | Source: Ambulatory Visit | Attending: Pulmonary Disease | Admitting: Pulmonary Disease

## 2020-10-09 DIAGNOSIS — Z01812 Encounter for preprocedural laboratory examination: Secondary | ICD-10-CM | POA: Insufficient documentation

## 2020-10-09 DIAGNOSIS — Z20822 Contact with and (suspected) exposure to covid-19: Secondary | ICD-10-CM | POA: Insufficient documentation

## 2020-10-10 LAB — SARS CORONAVIRUS 2 (TAT 6-24 HRS): SARS Coronavirus 2: NEGATIVE

## 2020-10-11 ENCOUNTER — Telehealth: Payer: Self-pay | Admitting: *Deleted

## 2020-10-11 ENCOUNTER — Encounter (HOSPITAL_COMMUNITY): Payer: Self-pay | Admitting: Pulmonary Disease

## 2020-10-11 ENCOUNTER — Other Ambulatory Visit: Payer: Self-pay

## 2020-10-11 NOTE — Progress Notes (Addendum)
[name] denies chest pain or shortness of breath. Patient tested negative for Covid on 12/11/21and has been in quarantine since tested.

## 2020-10-11 NOTE — Anesthesia Preprocedure Evaluation (Addendum)
Anesthesia Evaluation  Patient identified by MRN, date of birth, ID band Patient awake    Reviewed: Allergy & Precautions, H&P , NPO status , Patient's Chart, lab work & pertinent test results  Airway Mallampati: III  TM Distance: >3 FB Neck ROM: Full    Dental no notable dental hx. (+) Poor Dentition, Dental Advisory Given   Pulmonary shortness of breath, with exertion, lying and Long-Term Oxygen Therapy, COPD,  COPD inhaler and oxygen dependent, former smoker,    Pulmonary exam normal breath sounds clear to auscultation       Cardiovascular Exercise Tolerance: Good hypertension, negative cardio ROS   Rhythm:Regular Rate:Normal     Neuro/Psych Depression negative neurological ROS     GI/Hepatic negative GI ROS, Neg liver ROS,   Endo/Other  negative endocrine ROS  Renal/GU negative Renal ROS  negative genitourinary   Musculoskeletal   Abdominal   Peds  Hematology  (+) Blood dyscrasia, anemia ,   Anesthesia Other Findings   Reproductive/Obstetrics negative OB ROS                            Anesthesia Physical Anesthesia Plan  ASA: III  Anesthesia Plan: General   Post-op Pain Management:    Induction: Intravenous  PONV Risk Score and Plan: 3 and Ondansetron, Dexamethasone and Midazolam  Airway Management Planned: Oral ETT  Additional Equipment:   Intra-op Plan:   Post-operative Plan: Extubation in OR  Informed Consent: I have reviewed the patients History and Physical, chart, labs and discussed the procedure including the risks, benefits and alternatives for the proposed anesthesia with the patient or authorized representative who has indicated his/her understanding and acceptance.     Dental advisory given  Plan Discussed with: CRNA  Anesthesia Plan Comments:         Anesthesia Quick Evaluation

## 2020-10-11 NOTE — Telephone Encounter (Signed)
Call from sister, [name] that he has transportation issues and asking if his 12/16 visit can be moved closer to the 3:30 appointment he has w/pulmonary? Requests return call after 3 pm today. Called sister back and informed her there is no opening any other time that day. She agrees to move his appointment to 12/17 at 0930.

## 2020-10-12 ENCOUNTER — Other Ambulatory Visit: Payer: Self-pay

## 2020-10-12 ENCOUNTER — Ambulatory Visit (HOSPITAL_COMMUNITY)
Admission: RE | Admit: 2020-10-12 | Discharge: 2020-10-12 | Disposition: A | Discharge status: Home / Self Care | Payer: Medicare Other | Attending: Pulmonary Disease | Admitting: Pulmonary Disease

## 2020-10-12 ENCOUNTER — Ambulatory Visit (HOSPITAL_COMMUNITY): Payer: Medicare Other | Admitting: Certified Registered Nurse Anesthetist

## 2020-10-12 ENCOUNTER — Encounter (HOSPITAL_COMMUNITY): Payer: Self-pay | Admitting: Pulmonary Disease

## 2020-10-12 ENCOUNTER — Encounter: Payer: Self-pay | Admitting: *Deleted

## 2020-10-12 ENCOUNTER — Encounter (HOSPITAL_COMMUNITY)
Admission: RE | Disposition: A | Discharge status: Home / Self Care | Payer: Self-pay | Source: Home / Self Care | Attending: Pulmonary Disease

## 2020-10-12 DIAGNOSIS — R918 Other nonspecific abnormal finding of lung field: Secondary | ICD-10-CM | POA: Diagnosis not present

## 2020-10-12 DIAGNOSIS — B955 Unspecified streptococcus as the cause of diseases classified elsewhere: Secondary | ICD-10-CM | POA: Insufficient documentation

## 2020-10-12 DIAGNOSIS — Z87891 Personal history of nicotine dependence: Secondary | ICD-10-CM | POA: Insufficient documentation

## 2020-10-12 DIAGNOSIS — C3492 Malignant neoplasm of unspecified part of left bronchus or lung: Secondary | ICD-10-CM | POA: Diagnosis not present

## 2020-10-12 DIAGNOSIS — C3490 Malignant neoplasm of unspecified part of unspecified bronchus or lung: Secondary | ICD-10-CM | POA: Diagnosis present

## 2020-10-12 DIAGNOSIS — F1020 Alcohol dependence, uncomplicated: Secondary | ICD-10-CM | POA: Diagnosis not present

## 2020-10-12 DIAGNOSIS — J449 Chronic obstructive pulmonary disease, unspecified: Secondary | ICD-10-CM | POA: Diagnosis not present

## 2020-10-12 HISTORY — PX: VIDEO BRONCHOSCOPY: SHX5072

## 2020-10-12 HISTORY — PX: VIDEO BRONCHOSCOPY WITH ENDOBRONCHIAL ULTRASOUND: SHX6177

## 2020-10-12 HISTORY — DX: Dyspnea, unspecified: R06.00

## 2020-10-12 HISTORY — DX: Malignant (primary) neoplasm, unspecified: C80.1

## 2020-10-12 HISTORY — DX: Depression, unspecified: F32.A

## 2020-10-12 LAB — BASIC METABOLIC PANEL
Anion gap: 17 — ABNORMAL HIGH (ref 5–15)
BUN: 5 mg/dL — ABNORMAL LOW (ref 8–23)
CO2: 20 mmol/L — ABNORMAL LOW (ref 22–32)
Calcium: 8.3 mg/dL — ABNORMAL LOW (ref 8.9–10.3)
Chloride: 87 mmol/L — ABNORMAL LOW (ref 98–111)
Creatinine, Ser: 0.7 mg/dL (ref 0.61–1.24)
GFR, Estimated: >60 mL/min (ref >60)
Glucose, Bld: 114 mg/dL — ABNORMAL HIGH (ref 70–99)
Potassium: 4.1 mmol/L (ref 3.5–5.1)
Sodium: 124 mmol/L — ABNORMAL LOW (ref 135–145)

## 2020-10-12 LAB — CBC
HCT: 41.2 % (ref 39.0–52.0)
Hemoglobin: 13.8 g/dL (ref 13.0–17.0)
MCH: 34.8 pg — ABNORMAL HIGH (ref 26.0–34.0)
MCHC: 33.5 g/dL (ref 30.0–36.0)
MCV: 103.8 fL — ABNORMAL HIGH (ref 80.0–100.0)
Platelets: 172 10*3/uL (ref 150–400)
RBC: 3.97 MIL/uL — ABNORMAL LOW (ref 4.22–5.81)
RDW: 15.1 % (ref 11.5–15.5)
WBC: 8.4 10*3/uL (ref 4.0–10.5)
nRBC: 0 % (ref 0.0–0.2)

## 2020-10-12 SURGERY — BRONCHOSCOPY, WITH EBUS
Anesthesia: General

## 2020-10-12 MED ORDER — FENTANYL CITRATE (PF) 250 MCG/5ML IJ SOLN
INTRAMUSCULAR | Status: DC | PRN
  Administered 2020-10-12: 50 ug via INTRAVENOUS
  Administered 2020-10-12: 100 ug via INTRAVENOUS

## 2020-10-12 MED ORDER — FENTANYL CITRATE (PF) 100 MCG/2ML IJ SOLN: 25.0000 ug | INTRAMUSCULAR | Status: DC | PRN

## 2020-10-12 MED ORDER — FENTANYL CITRATE (PF) 250 MCG/5ML IJ SOLN
INTRAMUSCULAR | Status: AC
  Filled 2020-10-12: qty 5

## 2020-10-12 MED ORDER — DEXAMETHASONE SODIUM PHOSPHATE 10 MG/ML IJ SOLN
INTRAMUSCULAR | Status: AC
  Filled 2020-10-12: qty 1

## 2020-10-12 MED ORDER — LIDOCAINE 2% (20 MG/ML) 5 ML SYRINGE
INTRAMUSCULAR | Status: AC
  Filled 2020-10-12: qty 5

## 2020-10-12 MED ORDER — ONDANSETRON HCL 4 MG/2ML IJ SOLN
INTRAMUSCULAR | Status: AC
  Filled 2020-10-12: qty 2

## 2020-10-12 MED ORDER — PHENYLEPHRINE HCL-NACL 10-0.9 MG/250ML-% IV SOLN
INTRAVENOUS | Status: DC | PRN
  Administered 2020-10-12: 30 ug/min via INTRAVENOUS

## 2020-10-12 MED ORDER — EPINEPHRINE PF 1 MG/ML IJ SOLN
INTRAMUSCULAR | Status: DC | PRN
  Administered 2020-10-12: 1 mg

## 2020-10-12 MED ORDER — SUGAMMADEX SODIUM 200 MG/2ML IV SOLN
INTRAVENOUS | Status: DC | PRN
  Administered 2020-10-12: 150 mg via INTRAVENOUS

## 2020-10-12 MED ORDER — ROCURONIUM BROMIDE 10 MG/ML (PF) SYRINGE
PREFILLED_SYRINGE | INTRAVENOUS | Status: AC
  Filled 2020-10-12: qty 10

## 2020-10-12 MED ORDER — PROPOFOL 10 MG/ML IV BOLUS
INTRAVENOUS | Status: AC
  Filled 2020-10-12: qty 20

## 2020-10-12 MED ORDER — 0.9 % SODIUM CHLORIDE (POUR BTL) OPTIME
TOPICAL | Status: DC | PRN
  Administered 2020-10-12: 08:00:00 2000 mL

## 2020-10-12 MED ORDER — CHLORHEXIDINE GLUCONATE 0.12 % MT SOLN: 15.0000 mL | Freq: Once | OROMUCOSAL | Status: DC

## 2020-10-12 MED ORDER — CEFAZOLIN SODIUM-DEXTROSE 2-3 GM-%(50ML) IV SOLR
INTRAVENOUS | Status: DC | PRN
  Administered 2020-10-12: 2 g via INTRAVENOUS

## 2020-10-12 MED ORDER — ORAL CARE MOUTH RINSE: 15.0000 mL | Freq: Once | OROMUCOSAL | Status: DC

## 2020-10-12 MED ORDER — MIDAZOLAM HCL 2 MG/2ML IJ SOLN
INTRAMUSCULAR | Status: AC
  Filled 2020-10-12: qty 2

## 2020-10-12 MED ORDER — DEXAMETHASONE SODIUM PHOSPHATE 10 MG/ML IJ SOLN
INTRAMUSCULAR | Status: DC | PRN
  Administered 2020-10-12: 10 mg via INTRAVENOUS

## 2020-10-12 MED ORDER — LACTATED RINGERS IV SOLN: INTRAVENOUS | Status: DC

## 2020-10-12 MED ORDER — ROCURONIUM BROMIDE 10 MG/ML (PF) SYRINGE
PREFILLED_SYRINGE | INTRAVENOUS | Status: DC | PRN
  Administered 2020-10-12: 50 mg via INTRAVENOUS

## 2020-10-12 MED ORDER — ONDANSETRON HCL 4 MG/2ML IJ SOLN
INTRAMUSCULAR | Status: DC | PRN
  Administered 2020-10-12: 4 mg via INTRAVENOUS

## 2020-10-12 MED ORDER — MIDAZOLAM HCL 5 MG/5ML IJ SOLN
INTRAMUSCULAR | Status: DC | PRN
  Administered 2020-10-12: 2 mg via INTRAVENOUS

## 2020-10-12 MED ORDER — PROPOFOL 10 MG/ML IV BOLUS
INTRAVENOUS | Status: DC | PRN
  Administered 2020-10-12: 130 mg via INTRAVENOUS

## 2020-10-12 MED ORDER — LIDOCAINE 2% (20 MG/ML) 5 ML SYRINGE
INTRAMUSCULAR | Status: DC | PRN
  Administered 2020-10-12: 100 mg via INTRAVENOUS

## 2020-10-12 MED ORDER — EPINEPHRINE PF 1 MG/ML IJ SOLN
INTRAMUSCULAR | Status: AC
  Filled 2020-10-12: qty 1

## 2020-10-12 SURGICAL SUPPLY — 40 items
ADAPTER VALVE BIOPSY EBUS (MISCELLANEOUS) IMPLANT
ADPTR VALVE BIOPSY EBUS (MISCELLANEOUS)
BALLN PULMONARY 8 10 OD75CM (MISCELLANEOUS) ×1
BALLN PULMONARY 8-10 OD75 (MISCELLANEOUS) ×2 IMPLANT
BRUSH CYTOL CELLEBRITY 1.5X140 (MISCELLANEOUS) IMPLANT
CANISTER SUCT 3000ML PPV (MISCELLANEOUS) ×3 IMPLANT
CNTNR URN SCR LID CUP LEK RST (MISCELLANEOUS) ×1 IMPLANT
CONT SPEC 4OZ STRL OR WHT (MISCELLANEOUS) ×3
COVER BACK TABLE 60X90IN (DRAPES) ×3 IMPLANT
CRYOPROBE FLEX 1.7X1150 DISP (PROBE) ×3 IMPLANT
FILTER STRAW FLUID ASPIR (MISCELLANEOUS) ×3 IMPLANT
FORCEPS BIOP RJ4 1.8 (CUTTING FORCEPS) IMPLANT
GAUZE SPONGE 4X4 12PLY STRL (GAUZE/BANDAGES/DRESSINGS) ×3 IMPLANT
GLOVE SURG SS PI 7.5 STRL IVOR (GLOVE) ×6 IMPLANT
GOWN STRL REUS W/ TWL LRG LVL3 (GOWN DISPOSABLE) ×2 IMPLANT
GOWN STRL REUS W/TWL LRG LVL3 (GOWN DISPOSABLE) ×6
KIT CLEAN ENDO COMPLIANCE (KITS) ×6 IMPLANT
KIT TURNOVER KIT B (KITS) ×3 IMPLANT
MARKER SKIN DUAL TIP RULER LAB (MISCELLANEOUS) ×3 IMPLANT
NEEDLE ASPIRATION VIZISHOT 19G (NEEDLE) ×3 IMPLANT
NEEDLE ASPIRATION VIZISHOT 21G (NEEDLE) IMPLANT
NS IRRIG 1000ML POUR BTL (IV SOLUTION) ×3 IMPLANT
OIL SILICONE PENTAX (PARTS (SERVICE/REPAIRS)) ×3 IMPLANT
PAD ARMBOARD 7.5X6 YLW CONV (MISCELLANEOUS) ×6 IMPLANT
STOPCOCK 4 WAY LG BORE MALE ST (IV SETS) ×3 IMPLANT
SYR 20ML ECCENTRIC (SYRINGE) ×9 IMPLANT
SYR 20ML LL LF (SYRINGE) ×3 IMPLANT
SYR 3ML LL SCALE MARK (SYRINGE) ×3 IMPLANT
SYR 50ML SLIP (SYRINGE) ×6 IMPLANT
SYR 5ML LL (SYRINGE) ×12 IMPLANT
SYR GAUGE ASSEMBLY (MISCELLANEOUS) ×1
SYR GAUGE ASSEMBLY ALLIANCE II (MISCELLANEOUS) ×2 IMPLANT
TRAP SPECIMEN MUCUS 40CC (MISCELLANEOUS) IMPLANT
TUBE CONNECTING 20'X1/4 (TUBING) ×1
TUBE CONNECTING 20X1/4 (TUBING) ×2 IMPLANT
TUBING EXTENTION W/L.L. (IV SETS) ×3 IMPLANT
VALVE BIOPSY  SINGLE USE (MISCELLANEOUS) ×4
VALVE BIOPSY SINGLE USE (MISCELLANEOUS) ×2 IMPLANT
VALVE SUCTION BRONCHIO DISP (MISCELLANEOUS) ×6 IMPLANT
WATER STERILE IRR 1000ML POUR (IV SOLUTION) ×3 IMPLANT

## 2020-10-12 NOTE — Anesthesia Procedure Notes (Signed)
Procedure Name: Intubation Performed by: Milford Cage, CRNA Pre-anesthesia Checklist: Patient identified, Emergency Drugs available, Suction available and Patient being monitored Patient Re-evaluated:Patient Re-evaluated prior to induction Oxygen Delivery Method: Circle System Utilized Preoxygenation: Pre-oxygenation with 100% oxygen Induction Type: IV induction Ventilation: Mask ventilation without difficulty and Oral airway inserted - appropriate to patient size Laryngoscope Size: Mac and 3 Grade View: Grade II Tube type: Oral Tube size: 8.5 mm Number of attempts: 1 Airway Equipment and Method: Stylet and Oral airway Placement Confirmation: ETT inserted through vocal cords under direct vision,  positive ETCO2 and breath sounds checked- equal and bilateral Secured at: 24 cm Tube secured with: Tape Dental Injury: Teeth and Oropharynx as per pre-operative assessment

## 2020-10-12 NOTE — Interval H&P Note (Signed)
History and Physical Interval Note:  10/12/2020 6:59 AM  Ronald [name]  has presented today for surgery, with the diagnosis of HILAR MASS, ENDOBRONCHIAL TUMOR.  The various methods of treatment have been discussed with the patient and family. After consideration of risks, benefits and other options for treatment, the patient has consented to  Procedure(s): VIDEO BRONCHOSCOPY WITH ENDOBRONCHIAL ULTRASOUND (N/A) VIDEO BRONCHOSCOPY WITH FLUORO WITH POSSIBLE CRYOTHERAPY (N/A) as a surgical intervention.  The patient's history has been reviewed, patient examined, no change in status, stable for surgery.  I have reviewed the patient's chart and labs.  Questions were answered to the patient's satisfaction.     [place]

## 2020-10-12 NOTE — Discharge Instructions (Addendum)
Flexible Bronchoscopy, Care After This sheet gives you information about how to care for yourself after your test. Your doctor may also give you more specific instructions. If you have problems or questions, contact your doctor. Follow these instructions at home: Eating and drinking.  The day after the test, go back to your normal diet. Driving  Do not drive for 24 hours if you were given a medicine to help you relax (sedative).  Do not drive or use heavy machinery while taking prescription pain medicine. General instructions   Take over-the-counter and prescription medicines only as told by your doctor.  Return to your normal activities as told. Ask what activities are safe for you.  Do not use any products that have nicotine or tobacco in them. This includes cigarettes and e-cigarettes. If you need help quitting, ask your doctor.  Keep all follow-up visits as told by your doctor. This is important. It is very important if you had a tissue sample (biopsy) taken. Get help right away if:  You have shortness of breath that gets worse.  You get light-headed.  You feel like you are going to pass out (faint).  You have chest pain.  You cough up: ? More than a little blood. ? More blood than before. Summary  Do not eat or drink anything (not even water) for 2 hours after your test, or until your numbing medicine wears off.  Do not use cigarettes. Do not use e-cigarettes.  Get help right away if you have chest pain. This information is not intended to replace advice given to you by your health care provider. Make sure you discuss any questions you have with your health care provider. Document Revised: 09/28/2017 Document Reviewed: 11/03/2016 Elsevier Patient Education  2020 [employer].

## 2020-10-12 NOTE — Op Note (Signed)
Video Bronchoscopy with Endobronchial Ultrasound Procedure Note Video bronchoscopy with cryotherapy, tumor debulking, lesional excision, balloon dilatation.  Date of Operation: 10/12/2020  Pre-op Diagnosis: Left lower lobe mass  Post-op Diagnosis: Left lower lobe mass, endobronchial tumor, occluded left lower lobe.  Surgeon: Garner Nash, DO   Assistants: None   Anesthesia: General endotracheal anesthesia  Operation: Flexible video fiberoptic bronchoscopy with endobronchial ultrasound and biopsies.  Estimated Blood Loss: Minimal, <7OZ   Complications: None   Indications and History: Ronald [name] is a 64 y.o. male with history of left lower lobe squamous cell carcinoma, now with PET scan evidence of recurrence of disease and endobronchial obstruction.  The risks, benefits, complications, treatment options and expected outcomes were discussed with the patient.  The possibilities of pneumothorax, pneumonia, reaction to medication, pulmonary aspiration, perforation of a viscus, bleeding, failure to diagnose a condition and creating a complication requiring transfusion or operation were discussed with the patient who freely signed the consent.    Description of Procedure: The patient was examined in the preoperative area and history and data from the preprocedure consultation were reviewed. It was deemed appropriate to proceed.  The patient was taken to [facility] OR room 10, identified as [name] and the procedure verified as Flexible Video Fiberoptic Bronchoscopy.  A Time Out was held and the above information confirmed. After being taken to the operating room general anesthesia was initiated and the patient  was orally intubated. The video fiberoptic bronchoscope was introduced via the endotracheal tube and a general inspection was performed which showed normal right lung anatomy, occluded tumor within the left lower lobe, there was a small orifice with visible opening of the left upper lobe  anterior segment, a tumor occluding the opening of the lingula, there was no visible opening of the left lower lobe.  The left mainstem bifurcation was not recognizable due to necrotic tumor bed.   Endobronchial ultrasound: Target #1: station 7 The standard scope was then withdrawn and the endobronchial ultrasound was used to identify and characterize the peritracheal, hilar and bronchial lymph nodes. Inspection showed a 1.5 cm cross-section lymph node visualized within station 7, no visible paratracheal right or left station 4 or right hilar nodes visible all areas were inspected with endobronchial ultrasound. Using real-time ultrasound guidance Wang needle biopsies were take from Station 7 nodes and were sent for cytology and culture.   Endobronchial cryo biopsies, endobronchial cryotherapy, tumor debulking and balloon dilatation procedure: We exchanged the endobronchial ultrasound scope for a standard therapeutic bronchoscope.  1: 20,000 dilution of epinephrine was preemptively coated the left mainstem and tumor bed.  Using a 1.7 mm erbe cryotherapy probe pieces of tumor were excised from the left lower lobe and left mainstem.  We started with the superior edge of the tumor to free up the opening of the left upper lobe.  Following debulking we were able to remove chunks of tumor en bloc retraction with the scope and cryoprobe out of the ET tube for sample collection.  Intermittently saline was used for irrigation and cleaning of the field.  We were able to debride the edge of the tumor extending up the medial aspect of the left mainstem as well as debridement of tumor along the superior ridge that was encroaching on the opening of the left upper lobe.  The base of the tumor bed also received multiple 10-second freeze thaw cycles working in a distal to proximal fashion in a grid like pattern in an effort to treat  the entire tumor bed.  Following cryotherapy treatments we used a Chemical engineer 8-10 mm  balloon for airway dilatation.  The balloon was checked prior to insertion.  We inserted the balloon into the left mainstem and extended the tip asked the tumor bed into the opening of the left upper lobe.  32nd balloon dilatation cycles were completed with the balloon at 8 mm inflation.  This was completed with the tip of the balloon in the anterior segment as well as into the lingula.  Following 8 mm inflation cycles we moved to the 9 mm inflation cycle with the tip just past the tumor bed.  We were able to inflate safely to 9 mm for 2 additional 30 second cycles.  Between each cycle the balloon was removed and the airway was inspected for any tear of tissue or significant bleeding.  At the conclusion of the procedure ice saline was used for irrigation bronchial washings to be collected for cytology.  At the conclusion the bronchoscope was used for therapeutic aspiration of the bilateral mainstem's to remove blood clots and debris.  All remaining segments of the airways were patent except to the left lower lobe as described above.  Necrotic tumor bed remained intact with no significant bleeding.  Bronchoscope was brought to just above the main carina there was no evidence of active bleeding and the bronchoscope was removed the patient's airway.  Samples: 1. Wang needle biopsies from 7 node for cytology and culture 2. Endobronchial cryo biopsies 3. Left lower lobe BAL, bronchial washings for cytology  Plans:  The patient will be discharged from the PACU to home when recovered from anesthesia. We will review the cytology, pathology and microbiology results with the patient when they become available. Outpatient followup will be with Garner Nash, DO.   Garner Nash, DO [facility] 10/12/2020 9:06 AM

## 2020-10-12 NOTE — Transfer of Care (Signed)
Immediate Anesthesia Transfer of Care Note  Patient: Ronald [name]  Procedure(s) Performed: VIDEO BRONCHOSCOPY WITH ENDOBRONCHIAL ULTRASOUND (N/A ) VIDEO BRONCHOSCOPY WITH CRYOTHERAPY AND BALLOON DILATATION (N/A )  Patient Location: PACU  Anesthesia Type:General  Level of Consciousness: awake, alert  and oriented  Airway & Oxygen Therapy: Patient Spontanous Breathing and Patient connected to face mask oxygen  Post-op Assessment: Report given to RN and Post -op Vital signs reviewed and stable  Post vital signs: Reviewed and stable  Last Vitals:  Vitals Value Taken Time  BP 121/83 10/12/20 0919  Temp    Pulse 118 10/12/20 0921  Resp 17 10/12/20 0921  SpO2 97 % 10/12/20 0921  Vitals shown include unvalidated device data.  Last Pain:  Vitals:   10/12/20 0648  TempSrc: Oral         Complications: No complications documented.

## 2020-10-12 NOTE — Anesthesia Postprocedure Evaluation (Signed)
Anesthesia Post Note  Patient: VERNEL LANGENDERFER  Procedure(s) Performed: VIDEO BRONCHOSCOPY WITH ENDOBRONCHIAL ULTRASOUND (N/A ) VIDEO BRONCHOSCOPY WITH CRYOTHERAPY AND BALLOON DILATATION (N/A )     Patient location during evaluation: PACU Anesthesia Type: General Level of consciousness: awake and alert Pain management: pain level controlled Vital Signs Assessment: post-procedure vital signs reviewed and stable Respiratory status: spontaneous breathing, nonlabored ventilation and respiratory function stable Cardiovascular status: blood pressure returned to baseline, stable and tachycardic Postop Assessment: no apparent nausea or vomiting Anesthetic complications: no   No complications documented.  Last Vitals:  Vitals:   10/12/20 0930 10/12/20 0945  BP: 111/69   Pulse: (!) 112 (!) 113  Resp: 20 19  Temp:  (!) 36.3 C  SpO2: 95% 98%    Last Pain:  Vitals:   10/12/20 0945  TempSrc:   PainSc: 0-No pain                 Yuriy Cui,W. EDMOND

## 2020-10-12 NOTE — Progress Notes (Signed)
I received a message from Dr. Valeta Harms that patient needs to be seen by oncology. I updated Dr. Sondra Come and Dr. Benay Spice regarding this message.

## 2020-10-13 ENCOUNTER — Encounter (HOSPITAL_COMMUNITY): Payer: Self-pay | Admitting: Pulmonary Disease

## 2020-10-13 LAB — CYTOLOGY - NON PAP

## 2020-10-13 LAB — ACID FAST SMEAR (AFB, MYCOBACTERIA): Acid Fast Smear: NEGATIVE

## 2020-10-13 LAB — SURGICAL PATHOLOGY

## 2020-10-14 ENCOUNTER — Ambulatory Visit (INDEPENDENT_AMBULATORY_CARE_PROVIDER_SITE_OTHER): Payer: Medicare Other | Admitting: Pulmonary Disease

## 2020-10-14 ENCOUNTER — Inpatient Hospital Stay: Payer: Medicare Other | Admitting: Oncology

## 2020-10-14 ENCOUNTER — Encounter: Payer: Self-pay | Admitting: Pulmonary Disease

## 2020-10-14 ENCOUNTER — Other Ambulatory Visit: Payer: Self-pay

## 2020-10-14 VITALS — BP 120/70 | HR 107 | Ht 64.0 in | Wt 157.4 lb

## 2020-10-14 DIAGNOSIS — J449 Chronic obstructive pulmonary disease, unspecified: Secondary | ICD-10-CM

## 2020-10-14 DIAGNOSIS — Z9889 Other specified postprocedural states: Secondary | ICD-10-CM | POA: Diagnosis not present

## 2020-10-14 DIAGNOSIS — C3492 Malignant neoplasm of unspecified part of left bronchus or lung: Secondary | ICD-10-CM

## 2020-10-14 NOTE — Assessment & Plan Note (Signed)
Plan: Established with oncology, keep upcoming appointment

## 2020-10-14 NOTE — Assessment & Plan Note (Signed)
Tolerated bronchoscopy Overall doing well  Plan: Establish with oncology

## 2020-10-14 NOTE — Progress Notes (Signed)
@Patient  ID: Ronald [name], male    DOB: 1955-11-04, 64 y.o.   MRN: [id number]  Chief Complaint  Patient presents with  . Follow-up    Pt states he has been doing okay since last visit. Pt had a bronchoscopy performed 10/12/20. Pt states he is having to do about 3 neb treatments daily.    Referring provider: No ref. provider found  HPI:  64 year old male smoker followed in our office for COPD and squamous cell carcinoma  PMH: Hyponatremia, hypertension, anemia, CAD Smoker/ Smoking History:  Maintenance: Stiolto Pt of: Dr. Valeta Harms  10/14/2020  - Visit   64 year old male former smoker followed in our office by Dr. Valeta Harms.  Patient presenting today as a follow-up.  Patient had a bronchoscopy performed on 10/12/2020 patient presented today to discuss those results.  Surgical pathology shows squamous cell carcinoma.  Patient was last seen in our office in November/2021 with Dr. Valeta Harms.  He was last seen by medical oncology in June/2021.  Previously followed by Dr. Benay Spice.  Status post chemotherapy and radiation for squamous cell carcinoma left lower lobe.  He was lost to follow-up.  Plan of care and November/2021 office visit.  Patient reports adherence to Stiolto.  Has to use rescue inhaler 2-3 times daily.  He is aware of recent bronchoscopy results showing squamous cell carcinoma.  Has upcoming appointment with oncology tomorrow morning at 9 AM.  Tests:   Pathology:    Bronchoscopy image: 100% occlusion of the LLL Tumor at the level of the left main subcarina   08/02/2018: Bronchoscopy pathology Cytology, washings, brushings and forcep biopsies of the left lower lobe endobronchial mass consistent with squamous cell carcinoma Station 4L and 7 negative for malignancy   09/30/2020-PET scan-residual recurrent disease about left hilum of the chest in this patient with history of bronchogenic neoplasm in this location, slightly less metabolic activity in size of area abnormal  metabolic activity when compared to previous imaging, associated basilar collapse is similar but with slight increase in pleural fluid, metastatic versus primary pink periodic neoplasm involving the pancreatic head but not associated with ductal dilatation at the time, mildly AC joint symmetric activity and fullness about the left glossotonsillar region  10/12/2020-surgical pathology-squamous cell carcinoma   FENO:  No results found for: NITRICOXIDE  PFT: PFT Results Latest Ref Rng & Units 10/07/2018 09/23/2018  FVC-Pre L 2.52 -  FVC-Predicted Pre % 68 35  FVC-Post L 2.28 -  FVC-Predicted Post % 61 -  Pre FEV1/FVC % % 56 56  Post FEV1/FCV % % 60 -  FEV1-Pre L 1.41 0.74  FEV1-Predicted Pre % 50 26  FEV1-Post L 1.37 -  DLCO uncorrected ml/min/mmHg 14.53 -  DLCO UNC% % 59 -  DLVA Predicted % 93 -    WALK:  No flowsheet data found.  Imaging: NM PET Image Restage (PS) Skull Base to Thigh  Result Date: 09/30/2020 CLINICAL DATA:  Subsequent treatment strategy for bronchogenic neoplasm follow-up in this 64 year old male previously on systemic therapy also with history of prior radiation. EXAM: NUCLEAR MEDICINE PET SKULL BASE TO THIGH TECHNIQUE: 7.6 mCi F-18 FDG was injected intravenously. Full-ring PET imaging was performed from the skull base to thigh after the radiotracer. CT data was obtained and used for attenuation correction and anatomic localization. Fasting blood glucose: 127 mg/dl COMPARISON:  Prior chest CT from February 24, 2019 and PET scan from December of 2019 are available for comparison FINDINGS: Mediastinal blood pool activity: SUV max 2.39 Liver activity: SUV max  NA NECK: No hypermetabolic lymph nodes in the neck. There is asymmetry of metabolic activity in the LEFT glossotonsillar region on image 26 of series 4, mild fullness in this area is also suggested but there is extensive artifact from dental amalgam. Incidental CT findings: none CHEST: LEFT hilar mass with peripheral  consolidative changes shows persistent FDG uptake with a maximum SUV of 6.3, area difficult to separate from surrounding consolidative changes with activity measuring approximately 2.7 x 2.2 cm as compared to approximately 3.4 x 2.1 cm on the prior study. Maximum SUV on previous imaging at 10.2. Subtle area of added activity along the lateral margin posteriorly in the LEFT chest, lateral to consolidative changes (image 79 of series 4) slight nodular contour to pleural fluid in collapse with convex margin measuring 8 mm with a maximum SUV of 2.93 Incidental CT findings: Tiny pulmonary nodule in the RIGHT lung base (image 46 of series 8) is stable compared to previous imaging. Similar appearance of consolidative changes in the LEFT chest when compared to the prior study peripheral to the central mass. The small amounts of pleural fluid appears loculated just superior to this without signs of uptake at the periphery this suggest additional disease in the chest. Calcified atheromatous plaque of the thoracic aorta. No aneurysmal dilation. Post radiation changes about the LEFT hilum. Normal heart size. No pericardial fluid. Central pulmonary vasculature normal caliber. Esophagus grossly normal. ABDOMEN/PELVIS: Marked hypermetabolic changes in the head of the pancreas (image 127 of series 4) mass measuring 3.5 x 3.5 cm with a maximum SUV of 8.9 though area of focal uptake in the liver. No associated pancreatic ductal dilation. No adenopathy in the abdomen or the pelvis Incidental CT findings: Marked hepatic steatosis. Markedly lobular hepatic contours with hypertrophy of the caudate, compatible with steatosis and cirrhotic morphology. Spleen, adrenal glands and kidneys without acute process. Small cyst arising from the lateral LEFT kidney and small cyst in the interpolar RIGHT kidney laterally. Urinary bladder grossly normal. No acute gastrointestinal process with normal appendix. Calcified atheromatous plaque, extensive  calcified plaque in the abdominal aorta extending in the iliac vessels without aneurysmal caliber. SKELETON: No focal hypermetabolic activity to suggest skeletal metastasis. Incidental CT findings: none IMPRESSION: 1. Residual/recurrent disease about the LEFT hilum in the chest in this patient with history of bronchogenic neoplasm in this location, slightly less metabolic activity and size of area of abnormal metabolic activity when compared to previous imaging. 2. Associated basilar collapse is similar but with slight increase in pleural fluid and with small area of nodularity showing equivocal uptake at the periphery of consolidated lung. This raise the question of early pleural involvement. 3. 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 Endoscopic evaluation could be helpful to establish tissue diagnosis as warranted. 4. Mildly asymmetric activity and fullness about the LEFT glossotonsillar region at the base of the tongue. This may represent pooling of secretions but given the asymmetry consider direct visualization on follow-up. 5. Signs of liver disease and marked hepatic steatosis. Liver displays cirrhotic morphology. 6. Aortic atherosclerosis. These results will be called to the ordering clinician or representative by the Radiologist Assistant, and communication documented in the PACS or [employer]. Aortic Atherosclerosis (ICD10-I70.0). Electronically Signed   By: Zetta Bills M.D.   On: 09/30/2020 12:55    Lab Results:  CBC    Component Value Date/Time   WBC 8.4 10/12/2020 0629   RBC 3.97 (L) 10/12/2020 5456  HGB 13.8 10/12/2020 0629   HGB 13.8 03/28/2019 0808   HCT 41.2 10/12/2020 0629   PLT 172 10/12/2020 0629   PLT 125 (L) 03/28/2019 0808   MCV 103.8 (H) 10/12/2020 0629   MCH 34.8 (H) 10/12/2020 0629   MCHC 33.5 10/12/2020 0629   RDW 15.1 10/12/2020 0629   LYMPHSABS 0.9 03/28/2019 0808    MONOABS 0.8 03/28/2019 0808   EOSABS 0.3 03/28/2019 0808   BASOSABS 0.0 03/28/2019 0808    BMET    Component Value Date/Time   NA 124 (L) 10/12/2020 0629   K 4.1 10/12/2020 0629   CL 87 (L) 10/12/2020 0629   CO2 20 (L) 10/12/2020 0629   GLUCOSE 114 (H) 10/12/2020 0629   BUN 5 (L) 10/12/2020 0629   CREATININE 0.70 10/12/2020 0629   CREATININE 0.76 04/11/2019 1019   CALCIUM 8.3 (L) 10/12/2020 0629   GFRNONAA >60 10/12/2020 0629   GFRNONAA >60 04/11/2019 1019   GFRAA >60 04/11/2019 1019    BNP    Component Value Date/Time   BNP 79.0 10/27/2018 2254    ProBNP No results found for: PROBNP  Specialty Problems      Pulmonary Problems   Lung mass   Obstructive pneumonia   Stage 3 severe COPD by GOLD classification ([place])   Lung cancer ([place])   Squamous cell carcinoma lung, left (H[facility])   Endobronchial cancer, left (HCC)      No Known Allergies  Immunization History  Administered Date(s) Administered  . Influenza, Seasonal, Injecte, Preservative Fre 11/05/2019  . Pneumococcal Polysaccharide-23 09/22/2018    Past Medical History:  Diagnosis Date  . Alcoholism ([place])   . Anemia    iron deficiency  . Cancer ([place])    lung Stage I- 10/11/20  . Collapse of left lung    lower lobe  . COPD (chronic obstructive pulmonary disease) ([place])   . Depression   . Dyspnea    Exertion  . Family history of adverse reaction to anesthesia    sister had a bad headache  . Hypertension    10/11/20 - not in years  . Mediastinal adenopathy     and endobronchial lesion  . Pneumonia   . TB (pulmonary tuberculosis)   . Wears dentures     Tobacco History: Social History   Tobacco Use  Smoking Status Former Smoker  . Packs/day: 2.00  . Types: Cigarettes  . Quit date: 09/19/2018  . Years since quitting: 2.0  Smokeless Tobacco Current User  . Types: Snuff   Ready to quit: Not Answered Counseling given: Not Answered   Continue to not smoke  Outpatient Encounter Medications  as of 10/14/2020  Medication Sig  . albuterol (PROVENTIL) (2.5 MG/3ML) 0.083% nebulizer solution TAKE 3ML BY MOUTH VIA NEBULIZER EVERY 4 HOURS AS NEEDED FOR WHEEZING OR SHORTNESS OF BREATH (Patient taking differently: Take 2.5 mg by nebulization every 4 (four) hours as needed (wheezing/shortness of breath.).)  . albuterol (VENTOLIN HFA) 108 (90 Base) MCG/ACT inhaler INHALE 2 PUFFS BY MOUTH EVERY 6 HOURS AS NEEDED FOR SHORTNESS OF BREATH (Patient taking differently: Inhale 2 puffs into the lungs every 6 (six) hours as needed for wheezing or shortness of breath.)  . ascorbic acid (VITAMIN C) 500 MG tablet Take 500 mg by mouth daily.  . cholecalciferol (VITAMIN D3) 25 MCG (1000 UNIT) tablet Take 1,000 Units by mouth daily.  . feeding supplement, ENSURE ENLIVE, (ENSURE ENLIVE) LIQD Take 237 mLs by mouth 2 (two) times daily between meals. (Patient taking differently:  Take 237 mLs by mouth daily as needed (supplement).)  . ibuprofen (ADVIL,MOTRIN) 200 MG tablet Take 400 mg by mouth daily as needed for headache or moderate pain.  . Tiotropium Bromide-Olodaterol (STIOLTO RESPIMAT) 2.5-2.5 MCG/ACT AERS Inhale 2 puffs into the lungs daily.  . [DISCONTINUED] fexofenadine (ALLEGRA) 180 MG tablet Take 180 mg by mouth daily.  . [DISCONTINUED] omeprazole (PRILOSEC) 40 MG capsule Take 40 mg by mouth daily as needed (acid reflux).   No facility-administered encounter medications on file as of 10/14/2020.     Review of Systems  Review of Systems  Constitutional: Negative for activity change, chills, fatigue, fever and unexpected weight change.  HENT: Positive for congestion. Negative for postnasal drip, rhinorrhea, sinus pressure, sinus pain and sore throat.   Eyes: Negative.   Respiratory: Positive for cough and wheezing. Negative for shortness of breath.   Cardiovascular: Negative for chest pain and palpitations.  Gastrointestinal: Negative for constipation, diarrhea, nausea and vomiting.  Endocrine:  Negative.   Genitourinary: Negative.   Musculoskeletal: Negative.   Skin: Negative.   Neurological: Negative for dizziness and headaches.  Psychiatric/Behavioral: Negative.  Negative for dysphoric mood. The patient is not nervous/anxious.   All other systems reviewed and are negative.    Physical Exam  BP 120/70 (BP Location: Left Arm, Cuff Size: Normal)   Pulse (!) 107   Ht 5\' 4"  (1.626 m)   Wt 157 lb 6.4 oz (71.4 kg)   SpO2 98%   BMI 27.02 kg/m   Wt Readings from Last 5 Encounters:  10/14/20 157 lb 6.4 oz (71.4 kg)  10/12/20 154 lb 1.6 oz (69.9 kg)  09/15/20 153 lb 4 oz (69.5 kg)  05/01/19 138 lb 6.4 oz (62.8 kg)  04/11/19 141 lb 4.8 oz (64.1 kg)    BMI Readings from Last 5 Encounters:  10/14/20 27.02 kg/m  10/12/20 26.45 kg/m  09/15/20 26.31 kg/m  05/01/19 23.76 kg/m  04/11/19 24.25 kg/m     Physical Exam Vitals and nursing note reviewed.  Constitutional:      General: He is not in acute distress.    Appearance: Normal appearance. He is normal weight.  HENT:     Head: Normocephalic and atraumatic.     Right Ear: Hearing and external ear normal.     Left Ear: Hearing and external ear normal.     Nose: Nose normal. No mucosal edema or rhinorrhea.     Right Turbinates: Not enlarged.     Left Turbinates: Not enlarged.     Mouth/Throat:     Mouth: Mucous membranes are dry.     Pharynx: Oropharynx is clear. No oropharyngeal exudate.  Eyes:     Pupils: Pupils are equal, round, and reactive to light.  Cardiovascular:     Rate and Rhythm: Normal rate and regular rhythm.     Pulses: Normal pulses.     Heart sounds: Normal heart sounds. No murmur heard.   Pulmonary:     Effort: Pulmonary effort is normal.     Breath sounds: Wheezing (exp wheeze) present. No decreased breath sounds or rales.  Musculoskeletal:     Cervical back: Normal range of motion.     Right lower leg: No edema.     Left lower leg: No edema.  Lymphadenopathy:     Cervical: No cervical  adenopathy.  Skin:    General: Skin is warm and dry.     Capillary Refill: Capillary refill takes less than 2 seconds.     Findings: No erythema  or rash.  Neurological:     General: No focal deficit present.     Mental Status: He is alert and oriented to person, place, and time.     Motor: No weakness.     Coordination: Coordination normal.     Gait: Gait is intact. Gait normal.  Psychiatric:        Mood and Affect: Mood normal.        Behavior: Behavior normal. Behavior is cooperative.        Thought Content: Thought content normal.        Judgment: Judgment normal.       Assessment & Plan:   Stage 3 severe COPD by GOLD classification ([place]) Plan: Continue Stiolto Establish with oncology Remain physically active Continue rescue inhaler Follow-up in 2 to 3 months  Squamous cell carcinoma lung, left ([place]) Plan: Established with oncology, keep upcoming appointment  S/P bronchoscopy Tolerated bronchoscopy Overall doing well  Plan: Establish with oncology    Return in about 2 months (around 12/15/2020), or if symptoms worsen or fail to improve, for Follow up with Dr. Valeta Harms.   Lauraine Rinne, NP 10/14/2020   This appointment required 31 minutes of patient care (this includes precharting, chart review, review of results, face-to-face care, etc.).

## 2020-10-14 NOTE — Assessment & Plan Note (Signed)
Plan: Continue Careers information officer with oncology Remain physically active Continue rescue inhaler Follow-up in 2 to 3 months

## 2020-10-14 NOTE — Progress Notes (Signed)
Nice patient.  Stable post bronc.  Patient with upcoming appointment tomorrow a.m. per patient with oncology.  Routed notes.  Aaron Edelman

## 2020-10-14 NOTE — Patient Instructions (Addendum)
You were seen today by Lauraine Rinne, NP  for:   1. Stage 3 severe COPD by GOLD classification (HCC)  Stiolto Respimat inhaler >>>2 puffs daily >>>Take this no matter what >>>This is not a rescue inhaler  Only use your albuterol as a rescue medication to be used if you can't catch your breath by resting or doing a relaxed purse lip breathing pattern.  - The less you use it, the better it will work when you need it. - Ok to use up to 2 puffs  every 4 hours if you must but call for immediate appointment if use goes up over your usual need - Don't leave home without it !!  (think of it like the spare tire for your car)   Note your daily symptoms > remember "red flags" for COPD:   >>>Increase in cough >>>increase in sputum production >>>increase in shortness of breath or activity  intolerance.   If you notice these symptoms, please call the office to be seen.    2. S/P bronchoscopy  Continue to monitor symptoms  Okay to take over-the-counter cough medicine  3. Squamous cell carcinoma lung, left ([place]) 4. Endobronchial cancer, left [facility])  Keep upcoming appointment with oncology    Follow Up:    Return in about 2 months (around 12/15/2020), or if symptoms worsen or fail to improve, for Follow up with Dr. Valeta Harms.   Notification of test results are managed in the following manner: If there are  any recommendations or changes to the  plan of care discussed in office today,  we will contact you and let you know what they are. If you do not hear from [place], then your results are normal and you can view them through your  MyChart account , or a letter will be sent to you. Thank you again for trusting [place] with your care  - Thank you, [facility]    It is flu season:   >>> Best ways to protect herself from the flu: Receive the yearly flu vaccine, practice good hand hygiene washing with soap and also using hand sanitizer when available, eat a nutritious meals, get adequate rest, hydrate  appropriately       Please contact the office if your symptoms worsen or you have concerns that you are not improving.   Thank you for choosing [facility] for your healthcare, and for allowing [place] to partner with you on your healthcare journey. I am thankful to be able to provide care to you today.   Wyn Quaker FNP-C

## 2020-10-15 ENCOUNTER — Inpatient Hospital Stay: Payer: Medicare Other

## 2020-10-15 ENCOUNTER — Inpatient Hospital Stay: Payer: Medicare Other | Attending: Oncology | Admitting: Oncology

## 2020-10-15 ENCOUNTER — Other Ambulatory Visit: Payer: Self-pay

## 2020-10-15 ENCOUNTER — Telehealth: Payer: Self-pay

## 2020-10-15 VITALS — BP 131/83 | HR 106 | Temp 97.8°F | Resp 18 | Ht 64.0 in | Wt 156.9 lb

## 2020-10-15 DIAGNOSIS — Z923 Personal history of irradiation: Secondary | ICD-10-CM | POA: Insufficient documentation

## 2020-10-15 DIAGNOSIS — Z23 Encounter for immunization: Secondary | ICD-10-CM

## 2020-10-15 DIAGNOSIS — Z9221 Personal history of antineoplastic chemotherapy: Secondary | ICD-10-CM | POA: Insufficient documentation

## 2020-10-15 DIAGNOSIS — J90 Pleural effusion, not elsewhere classified: Secondary | ICD-10-CM | POA: Diagnosis not present

## 2020-10-15 DIAGNOSIS — R220 Localized swelling, mass and lump, head: Secondary | ICD-10-CM | POA: Insufficient documentation

## 2020-10-15 DIAGNOSIS — C3432 Malignant neoplasm of lower lobe, left bronchus or lung: Secondary | ICD-10-CM | POA: Insufficient documentation

## 2020-10-15 DIAGNOSIS — K8689 Other specified diseases of pancreas: Secondary | ICD-10-CM

## 2020-10-15 NOTE — H&P (View-Only) (Signed)
Ronald [name] OFFICE PROGRESS NOTE   Diagnosis: Non-small cell lung cancer  INTERVAL HISTORY:   Ronald [name] was last seen in the medical oncology clinic in June 2020.  He did not return for scheduled follow-up.  He is followed by Dr. Valeta Harms for treatment of COPD.  He was referred for a restaging PET scan on 09/30/2020.  Asymmetric activity was noted at the left glossotonsillar region.  A left hilar mass has increased FDG uptake.  The mass is difficult to separate from surrounding consolidation.  Slight nodular contour is noted in left pleural fluid with an SUV max of 2.93.  There is marked hypermetabolism in the head of the pancreas with a mass measuring 3.5 x 3.5 cm with an SUV of 8.9.  Changes of cirrhosis.  He underwent a diagnostic/therapeutic bronchoscopy on 10/12/2020.  He reports episodes of hemoptysis following the procedure.  He feels well at present.  Ronald [name] is here today with his sister.   Review of systems: Positives-hemoptysis following the bronchoscopy procedure A complete review of systems was otherwise negative  Objective:  Vital signs in last 24 hours:  Blood pressure 131/83, pulse (!) 106, temperature 97.8 F (36.6 C), temperature source Tympanic, resp. rate 18, height 5\' 4"  (1.626 m), weight 156 lb 14.4 oz (71.2 kg), SpO2 99 %.    HEENT: Oropharynx without visible mass, neck without mass Lymphatics: No cervical, supraclavicular, axillary, or inguinal nodes Resp: Decreased breath sounds and rhonchi at the left lower posterior chest, no respiratory distress Cardio: Regular rate and rhythm GI: No hepatosplenomegaly, no mass, no apparent ascites Vascular: No leg edema Neuro: Alert, follows commands    Portacath/PICC-without erythema  Lab Results:  Lab Results  Component Value Date   WBC 8.4 10/12/2020   HGB 13.8 10/12/2020   HCT 41.2 10/12/2020   MCV 103.8 (H) 10/12/2020   PLT 172 10/12/2020   NEUTROABS 2.9 03/28/2019    CMP  Lab Results   Component Value Date   NA 124 (L) 10/12/2020   K 4.1 10/12/2020   CL 87 (L) 10/12/2020   CO2 20 (L) 10/12/2020   GLUCOSE 114 (H) 10/12/2020   BUN 5 (L) 10/12/2020   CREATININE 0.70 10/12/2020   CALCIUM 8.3 (L) 10/12/2020   PROT 7.7 04/11/2019   ALBUMIN 3.7 04/11/2019   AST 104 (H) 04/11/2019   ALT 54 (H) 04/11/2019   ALKPHOS 139 (H) 04/11/2019   BILITOT 0.5 04/11/2019   GFRNONAA >60 10/12/2020   GFRAA >60 04/11/2019    No results found for: CEA1  Lab Results  Component Value Date   INR 1.00 10/27/2018    Imaging:  No results found.  Medications: I have reviewed the patient's current medications.   Assessment/Plan: 1. Non-small cell lung cancer-squamous cell carcinoma  CT chest 09/19/2018 concerning for an obstructing left lower lobe mass, small left pleural effusion, 4 mm right lower lobe nodule  CT head 09/20/2018-no acute abnormality  Bronchoscopy 10/02/2018-complete occlusion of the left lower lobe with an obstructing mass, biopsies, washing, and brushing reveal squamous cell carcinoma, biopsies of level 7 and 4L nodes-negative  PET scan 10/08/2018-hypermetabolic left hilar mass obstructing the left lower lobe bronchus with left lower lobe collapse, no evidence of metastatic lymphadenopathy or distant metastases  Clinical stage Ib (T2N0)  Radiation beginning 11/19/2018  Cycle 1 weekly Taxol/carboplatin 11/22/2018  Cycle 2 weekly Taxol/carboplatin 11/29/2018  Cycle 3 weekly Taxol/carboplatin2/04/2019  Cycle 4 weekly Taxol/carboplatin 12/13/2018  Cycle 5 weekly Taxol/carboplatin 12/20/2018  Cycle 6 weekly  Taxol/carboplatin 12/27/2018  CT 02/24/2019- stable left lower lobe collapse, decreased left pleural effusion, abnormal soft tissue in the inferior left hilum  Durvalumab 03/28/2019 and 04/11/2019, patient then lost to follow-up  PET 09/30/2020-residual/recurrent disease of the left hilum with associated basilar collapse, slight increase in pleural fluid  with small area of nodularity with equivocal uptake at the peripher of consolidated lung, hypermetabolic pancreas head mass, mild asymmetry at the left glossotonsillar region, cirrhosis  10/12/2020 bronchoscopy with cryotherapy, tumor debulking, lesional excision, and balloon dilatation-occluding tumor was noted in the left lower lobe with a small visible opening of the left upper lobe anterior segment, tumor occluded the lingula, no visible opening of the left lower lobe, the left mainstem bifurcation was not recognized due to necrotic tumor, biopsies were taken of level 7 nodes, tumor was debulked from the left upper lobe orifice and cryotherapy/dilation procedures were performed, the pathology revealed squamous cell carcinoma involving an endobronchial biopsy, the biopsy from the level 7 node revealed no malignant cells. 2.Tobacco and alcohol use 3.COPD 4.Weight loss 5.Anemia 6.History of tuberculosis 7.Changes of cirrhosis on the PET scan 10/08/2018 8.  Hypermetabolic pancreas head mass on PET scan 09/30/2020      Disposition: Ronald [name] has a history of squamous cell carcinoma of the left hilum and was treated with a course of chemotherapy/radiation and few cycles of immunotherapy in 2020.  He now has recurrent disease at the left hilum causing obstruction of the left upper lobe and left lower lobe bronchi.  There is CT/PET evidence of potential left pleural involvement and a mass at the pancreas head.  Ronald [name] underwent a diagnostic/therapeutic bronchoscopy on 10/12/2020.  He appears stable today.  We discussed treatment options.  He will not be a candidate for additional radiation and he is not a surgical candidate.  We will submit the 10/12/2020 endobronchial tumor for Foundation 1 and PD1 testing.  We will decide on systemic treatment options based on these results.  Ronald [name] will be referred to GI for a diagnostic endoscopic ultrasound.  The lesion of the pancreas could  represent a primary tumor or a metastasis.  He will return for an office visit here after the endoscopic ultrasound.  I will present his case at the GI tumor conference.  Ronald [name] received the first COVID-19 vaccine today. Betsy Coder, MD  10/15/2020  10:09 AM

## 2020-10-15 NOTE — Telephone Encounter (Signed)
EUS scheduled, pt instructed and medications reviewed.  Patient instructions mailed to home.  Patient to call with any questions or concerns.  

## 2020-10-15 NOTE — Progress Notes (Signed)
[facility] OFFICE PROGRESS NOTE   Diagnosis: Non-small cell lung cancer  INTERVAL HISTORY:   Ronald [name] was last seen in the medical oncology clinic in June 2020.  He did not return for scheduled follow-up.  He is followed by Dr. Valeta Harms for treatment of COPD.  He was referred for a restaging PET scan on 09/30/2020.  Asymmetric activity was noted at the left glossotonsillar region.  A left hilar mass has increased FDG uptake.  The mass is difficult to separate from surrounding consolidation.  Slight nodular contour is noted in left pleural fluid with an SUV max of 2.93.  There is marked hypermetabolism in the head of the pancreas with a mass measuring 3.5 x 3.5 cm with an SUV of 8.9.  Changes of cirrhosis.  He underwent a diagnostic/therapeutic bronchoscopy on 10/12/2020.  He reports episodes of hemoptysis following the procedure.  He feels well at present.  Ronald [name] is here today with his sister.   Review of systems: Positives-hemoptysis following the bronchoscopy procedure A complete review of systems was otherwise negative  Objective:  Vital signs in last 24 hours:  Blood pressure 131/83, pulse (!) 106, temperature 97.8 F (36.6 C), temperature source Tympanic, resp. rate 18, height 5\' 4"  (1.626 m), weight 156 lb 14.4 oz (71.2 kg), SpO2 99 %.    HEENT: Oropharynx without visible mass, neck without mass Lymphatics: No cervical, supraclavicular, axillary, or inguinal nodes Resp: Decreased breath sounds and rhonchi at the left lower posterior chest, no respiratory distress Cardio: Regular rate and rhythm GI: No hepatosplenomegaly, no mass, no apparent ascites Vascular: No leg edema Neuro: Alert, follows commands    Portacath/PICC-without erythema  Lab Results:  Lab Results  Component Value Date   WBC 8.4 10/12/2020   HGB 13.8 10/12/2020   HCT 41.2 10/12/2020   MCV 103.8 (H) 10/12/2020   PLT 172 10/12/2020   NEUTROABS 2.9 03/28/2019    CMP  Lab Results   Component Value Date   NA 124 (L) 10/12/2020   K 4.1 10/12/2020   CL 87 (L) 10/12/2020   CO2 20 (L) 10/12/2020   GLUCOSE 114 (H) 10/12/2020   BUN 5 (L) 10/12/2020   CREATININE 0.70 10/12/2020   CALCIUM 8.3 (L) 10/12/2020   PROT 7.7 04/11/2019   ALBUMIN 3.7 04/11/2019   AST 104 (H) 04/11/2019   ALT 54 (H) 04/11/2019   ALKPHOS 139 (H) 04/11/2019   BILITOT 0.5 04/11/2019   GFRNONAA >60 10/12/2020   GFRAA >60 04/11/2019    No results found for: CEA1  Lab Results  Component Value Date   INR 1.00 10/27/2018    Imaging:  No results found.  Medications: I have reviewed the patient's current medications.   Assessment/Plan: 1. Non-small cell lung cancer-squamous cell carcinoma  CT chest 09/19/2018 concerning for an obstructing left lower lobe mass, small left pleural effusion, 4 mm right lower lobe nodule  CT head 09/20/2018-no acute abnormality  Bronchoscopy 10/02/2018-complete occlusion of the left lower lobe with an obstructing mass, biopsies, washing, and brushing reveal squamous cell carcinoma, biopsies of level 7 and 4L nodes-negative  PET scan 10/08/2018-hypermetabolic left hilar mass obstructing the left lower lobe bronchus with left lower lobe collapse, no evidence of metastatic lymphadenopathy or distant metastases  Clinical stage Ib (T2N0)  Radiation beginning 11/19/2018  Cycle 1 weekly Taxol/carboplatin 11/22/2018  Cycle 2 weekly Taxol/carboplatin 11/29/2018  Cycle 3 weekly Taxol/carboplatin2/04/2019  Cycle 4 weekly Taxol/carboplatin 12/13/2018  Cycle 5 weekly Taxol/carboplatin 12/20/2018  Cycle 6 weekly  Taxol/carboplatin 12/27/2018  CT 02/24/2019- stable left lower lobe collapse, decreased left pleural effusion, abnormal soft tissue in the inferior left hilum  Durvalumab 03/28/2019 and 04/11/2019, patient then lost to follow-up  PET 09/30/2020-residual/recurrent disease of the left hilum with associated basilar collapse, slight increase in pleural fluid  with small area of nodularity with equivocal uptake at the peripher of consolidated lung, hypermetabolic pancreas head mass, mild asymmetry at the left glossotonsillar region, cirrhosis  10/12/2020 bronchoscopy with cryotherapy, tumor debulking, lesional excision, and balloon dilatation-occluding tumor was noted in the left lower lobe with a small visible opening of the left upper lobe anterior segment, tumor occluded the lingula, no visible opening of the left lower lobe, the left mainstem bifurcation was not recognized due to necrotic tumor, biopsies were taken of level 7 nodes, tumor was debulked from the left upper lobe orifice and cryotherapy/dilation procedures were performed, the pathology revealed squamous cell carcinoma involving an endobronchial biopsy, the biopsy from the level 7 node revealed no malignant cells. 2.Tobacco and alcohol use 3.COPD 4.Weight loss 5.Anemia 6.History of tuberculosis 7.Changes of cirrhosis on the PET scan 10/08/2018 8.  Hypermetabolic pancreas head mass on PET scan 09/30/2020      Disposition: Ronald [name] has a history of squamous cell carcinoma of the left hilum and was treated with a course of chemotherapy/radiation and few cycles of immunotherapy in 2020.  He now has recurrent disease at the left hilum causing obstruction of the left upper lobe and left lower lobe bronchi.  There is CT/PET evidence of potential left pleural involvement and a mass at the pancreas head.  Ronald [name] underwent a diagnostic/therapeutic bronchoscopy on 10/12/2020.  He appears stable today.  We discussed treatment options.  He will not be a candidate for additional radiation and he is not a surgical candidate.  We will submit the 10/12/2020 endobronchial tumor for Foundation 1 and PD1 testing.  We will decide on systemic treatment options based on these results.  Ronald [name] will be referred to GI for a diagnostic endoscopic ultrasound.  The lesion of the pancreas could  represent a primary tumor or a metastasis.  He will return for an office visit here after the endoscopic ultrasound.  I will present his case at the GI tumor conference.  Ronald [name] received the first COVID-19 vaccine today. Betsy Coder, MD  10/15/2020  10:09 AM

## 2020-10-15 NOTE — Telephone Encounter (Signed)
EUS scheduled for 12/30 at [facility] with Dr Ardis Hughs at 1245 pm.  COVID test on 12/27 at 1055 am.    Left message on machine to call back

## 2020-10-15 NOTE — Progress Notes (Signed)
PCCM: Thanks for seeing him Lance Muss, DO [facility] 10/15/2020 5:35 PM

## 2020-10-15 NOTE — Telephone Encounter (Signed)
-----   Message from Milus Banister, MD sent at 10/15/2020  3:20 PM EST ----- Regarding: RE: Referral for EUS OK,  Logan Vegh, can you offer this patient Thursday Dec 30th at the end of my currently scheduled cases unless [facility] has something sooner.  Pancreatic mass, PET avid. Thanks     ----- Message ----- From: Timothy Lasso, RN Sent: 10/15/2020   1:08 PM EST To: Milus Banister, MD, # Subject: [place]: Referral for EUS                            ----- Message ----- From: Jonnie Finner, RN Sent: 10/15/2020   1:02 PM EST To: Marlon Pel, RN, Timothy Lasso, RN Subject: Referral for EUS                               [facility] Sheri,  Dr. Benay Spice put in a referral for this patient today for EUS regarding Pancreatic mass.  He is asking this been done asap of course.  All his records are in [place].  Thanks Roney Mans RN GI Nurse [place] [phone]

## 2020-10-15 NOTE — Progress Notes (Signed)
   Covid-19 Vaccination Clinic  Name:  Ronald [name]    MRN: [id number] DOB: 1956/02/26  10/15/2020  [name] was observed post Covid-19 immunization for 15 minutes without incident. He was provided with Vaccine Information Sheet and instruction to access the V-Safe system.   [name] was instructed to call 911 with any severe reactions post vaccine: Marland Kitchen Difficulty breathing  . Swelling of face and throat  . A fast heartbeat  . A bad rash all over body  . Dizziness and weakness   Immunizations Administered    Name Date Dose VIS Date Route   Pfizer COVID-19 Vaccine 10/15/2020 10:55 AM 0.3 mL 08/18/2020 Intramuscular   Manufacturer: [place]   Lot: [id number]   [place]: [id number]

## 2020-10-18 ENCOUNTER — Encounter: Payer: Self-pay | Admitting: *Deleted

## 2020-10-18 ENCOUNTER — Telehealth: Payer: Self-pay | Admitting: Oncology

## 2020-10-18 LAB — AEROBIC/ANAEROBIC CULTURE W GRAM STAIN (SURGICAL/DEEP WOUND)

## 2020-10-18 NOTE — Progress Notes (Signed)
Per Dr. Benay Spice, I notified pathology dept to send tissue from case [id number].

## 2020-10-18 NOTE — Telephone Encounter (Signed)
Scheduled appointment per 12/17 los. Spoke to patient's sister who handles all of patient's appointments. She is aware of appointment date and time.

## 2020-10-19 ENCOUNTER — Encounter (HOSPITAL_COMMUNITY): Payer: Self-pay | Admitting: Gastroenterology

## 2020-10-20 ENCOUNTER — Other Ambulatory Visit: Payer: Self-pay

## 2020-10-20 NOTE — Progress Notes (Signed)
The proposed treatment discussed in conference is for discussion purposes only and is not a binding recommendation.  The patients have not been physically examined, or presented with their treatment options.  Therefore, final treatment plans cannot be decided.   

## 2020-10-25 ENCOUNTER — Other Ambulatory Visit (HOSPITAL_COMMUNITY)
Admission: RE | Admit: 2020-10-25 | Discharge: 2020-10-25 | Disposition: A | Discharge status: Home / Self Care | Payer: Medicare Other | Source: Ambulatory Visit | Attending: Gastroenterology | Admitting: Gastroenterology

## 2020-10-25 DIAGNOSIS — Z01812 Encounter for preprocedural laboratory examination: Secondary | ICD-10-CM | POA: Diagnosis present

## 2020-10-25 DIAGNOSIS — Z20822 Contact with and (suspected) exposure to covid-19: Secondary | ICD-10-CM | POA: Insufficient documentation

## 2020-10-25 LAB — SARS CORONAVIRUS 2 (TAT 6-24 HRS): SARS Coronavirus 2: NEGATIVE

## 2020-10-28 ENCOUNTER — Encounter (HOSPITAL_COMMUNITY): Payer: Self-pay | Admitting: Gastroenterology

## 2020-10-28 ENCOUNTER — Other Ambulatory Visit: Payer: Self-pay

## 2020-10-28 ENCOUNTER — Ambulatory Visit (HOSPITAL_COMMUNITY): Payer: Medicare Other | Admitting: Anesthesiology

## 2020-10-28 ENCOUNTER — Ambulatory Visit (HOSPITAL_COMMUNITY)
Admission: RE | Admit: 2020-10-28 | Discharge: 2020-10-28 | Disposition: A | Discharge status: Home / Self Care | Payer: Medicare Other | Attending: Gastroenterology | Admitting: Gastroenterology

## 2020-10-28 ENCOUNTER — Encounter (HOSPITAL_COMMUNITY)
Admission: RE | Disposition: A | Discharge status: Home / Self Care | Payer: Self-pay | Source: Home / Self Care | Attending: Gastroenterology

## 2020-10-28 DIAGNOSIS — Z9981 Dependence on supplemental oxygen: Secondary | ICD-10-CM | POA: Insufficient documentation

## 2020-10-28 DIAGNOSIS — Z8611 Personal history of tuberculosis: Secondary | ICD-10-CM | POA: Insufficient documentation

## 2020-10-28 DIAGNOSIS — K8689 Other specified diseases of pancreas: Secondary | ICD-10-CM | POA: Diagnosis not present

## 2020-10-28 DIAGNOSIS — Z9221 Personal history of antineoplastic chemotherapy: Secondary | ICD-10-CM | POA: Diagnosis not present

## 2020-10-28 DIAGNOSIS — C3402 Malignant neoplasm of left main bronchus: Secondary | ICD-10-CM | POA: Diagnosis not present

## 2020-10-28 DIAGNOSIS — Z923 Personal history of irradiation: Secondary | ICD-10-CM | POA: Diagnosis not present

## 2020-10-28 DIAGNOSIS — Z87891 Personal history of nicotine dependence: Secondary | ICD-10-CM | POA: Diagnosis not present

## 2020-10-28 DIAGNOSIS — K869 Disease of pancreas, unspecified: Secondary | ICD-10-CM | POA: Diagnosis present

## 2020-10-28 DIAGNOSIS — C7889 Secondary malignant neoplasm of other digestive organs: Secondary | ICD-10-CM | POA: Insufficient documentation

## 2020-10-28 HISTORY — PX: ESOPHAGOGASTRODUODENOSCOPY (EGD) WITH PROPOFOL: SHX5813

## 2020-10-28 HISTORY — PX: FINE NEEDLE ASPIRATION: SHX5430

## 2020-10-28 HISTORY — PX: EUS: SHX5427

## 2020-10-28 SURGERY — ESOPHAGOGASTRODUODENOSCOPY (EGD) WITH PROPOFOL
Anesthesia: Monitor Anesthesia Care

## 2020-10-28 MED ORDER — SODIUM CHLORIDE 0.9 % IV SOLN: INTRAVENOUS | Status: DC

## 2020-10-28 MED ORDER — PHENYLEPHRINE 40 MCG/ML (10ML) SYRINGE FOR IV PUSH (FOR BLOOD PRESSURE SUPPORT)
PREFILLED_SYRINGE | INTRAVENOUS | Status: DC | PRN
  Administered 2020-10-28: 120 ug via INTRAVENOUS

## 2020-10-28 MED ORDER — PROPOFOL 10 MG/ML IV BOLUS
INTRAVENOUS | Status: DC | PRN
  Administered 2020-10-28 (×2): 25 mg via INTRAVENOUS

## 2020-10-28 MED ORDER — PROPOFOL 500 MG/50ML IV EMUL
INTRAVENOUS | Status: DC | PRN
  Administered 2020-10-28: 150 ug/kg/min via INTRAVENOUS

## 2020-10-28 MED ORDER — PROPOFOL 10 MG/ML IV BOLUS
INTRAVENOUS | Status: AC
  Filled 2020-10-28: qty 20

## 2020-10-28 MED ORDER — LACTATED RINGERS IV SOLN: Freq: Once | INTRAVENOUS | Status: AC

## 2020-10-28 MED ORDER — LACTATED RINGERS IV SOLN: INTRAVENOUS | Status: DC | PRN

## 2020-10-28 MED ORDER — PROPOFOL 500 MG/50ML IV EMUL
INTRAVENOUS | Status: AC
  Filled 2020-10-28: qty 50

## 2020-10-28 MED ORDER — LIDOCAINE 2% (20 MG/ML) 5 ML SYRINGE
INTRAMUSCULAR | Status: DC | PRN
  Administered 2020-10-28: 60 mg via INTRAVENOUS

## 2020-10-28 SURGICAL SUPPLY — 14 items

## 2020-10-28 NOTE — Anesthesia Postprocedure Evaluation (Signed)
Anesthesia Post Note  Patient: Ronald [name]  Procedure(s) Performed: ESOPHAGOGASTRODUODENOSCOPY (EGD) WITH PROPOFOL (N/A ) UPPER ENDOSCOPIC ULTRASOUND (EUS) RADIAL (N/A ) FINE NEEDLE ASPIRATION (FNA) LINEAR (N/A )     Patient location during evaluation: Endoscopy Anesthesia Type: MAC Level of consciousness: awake and alert Pain management: pain level controlled Vital Signs Assessment: post-procedure vital signs reviewed and stable Respiratory status: spontaneous breathing, nonlabored ventilation and respiratory function stable Cardiovascular status: blood pressure returned to baseline and stable Postop Assessment: no apparent nausea or vomiting Anesthetic complications: no   No complications documented.  Last Vitals:  Vitals:   10/28/20 1300 10/28/20 1313  BP: 129/71 137/79  Pulse: (!) 101 99  Resp: (!) 21 19  Temp:    SpO2: 98% 100%    Last Pain:  Vitals:   10/28/20 1253  TempSrc: Oral  PainSc: 0-No pain                 Lidia Collum

## 2020-10-28 NOTE — Interval H&P Note (Signed)
History and Physical Interval Note:  10/28/2020 11:43 AM  [name]  has presented today for surgery, with the diagnosis of pancreatic mass.  The various methods of treatment have been discussed with the patient and family. After consideration of risks, benefits and other options for treatment, the patient has consented to  Procedure(s): ESOPHAGOGASTRODUODENOSCOPY (EGD) WITH PROPOFOL (N/A) UPPER ENDOSCOPIC ULTRASOUND (EUS) RADIAL (N/A) as a surgical intervention.  The patient's history has been reviewed, patient examined, no change in status, stable for surgery.  I have reviewed the patient's chart and labs.  Questions were answered to the patient's satisfaction.     Milus Banister

## 2020-10-28 NOTE — Transfer of Care (Signed)
Immediate Anesthesia Transfer of Care Note  Patient: Ronald [name]  Procedure(s) Performed: ESOPHAGOGASTRODUODENOSCOPY (EGD) WITH PROPOFOL (N/A ) UPPER ENDOSCOPIC ULTRASOUND (EUS) RADIAL (N/A ) FINE NEEDLE ASPIRATION (FNA) LINEAR (N/A )  Patient Location: Endoscopy Unit  Anesthesia Type:MAC  Level of Consciousness: awake, alert , oriented and patient cooperative  Airway & Oxygen Therapy: Patient Spontanous Breathing and Patient connected to face mask  Post-op Assessment: Report given to RN and Post -op Vital signs reviewed and stable  Post vital signs: Reviewed and stable  Last Vitals:  Vitals Value Taken Time  BP    Temp    Pulse    Resp    SpO2      Last Pain:  Vitals:   10/28/20 1144  TempSrc: Oral  PainSc: 0-No pain         Complications: No complications documented.

## 2020-10-28 NOTE — Anesthesia Preprocedure Evaluation (Signed)
Anesthesia Evaluation  Patient identified by MRN, date of birth, ID band Patient awake    Reviewed: Allergy & Precautions, NPO status , Patient's Chart, lab work & pertinent test results  History of Anesthesia Complications Negative for: history of anesthetic complications  Airway Mallampati: III  TM Distance: >3 FB Neck ROM: Full    Dental   Pulmonary COPD,  COPD inhaler and oxygen dependent, former smoker,  Lung ca   Pulmonary exam normal        Cardiovascular hypertension, Normal cardiovascular exam     Neuro/Psych negative neurological ROS     GI/Hepatic (+)     substance abuse  alcohol use, Pancreatic mass   Endo/Other  negative endocrine ROS  Renal/GU negative Renal ROS  negative genitourinary   Musculoskeletal negative musculoskeletal ROS (+)   Abdominal   Peds  Hematology negative hematology ROS (+)   Anesthesia Other Findings   Reproductive/Obstetrics                             Anesthesia Physical Anesthesia Plan  ASA: III  Anesthesia Plan: MAC   Post-op Pain Management:    Induction: Intravenous  PONV Risk Score and Plan: 1 and Propofol infusion, TIVA and Treatment may vary due to age or medical condition  Airway Management Planned: Natural Airway, Nasal Cannula and Simple Face Mask  Additional Equipment: None  Intra-op Plan:   Post-operative Plan:   Informed Consent: I have reviewed the patients History and Physical, chart, labs and discussed the procedure including the risks, benefits and alternatives for the proposed anesthesia with the patient or authorized representative who has indicated his/her understanding and acceptance.       Plan Discussed with:   Anesthesia Plan Comments:         Anesthesia Quick Evaluation

## 2020-10-28 NOTE — Op Note (Addendum)
[facility] Patient Name: Ronald [name] Procedure Date: 10/28/2020 MRN: [id number] Attending MD: Milus Banister , MD Date of Birth: [birth year] CSN: [id number] Age: 64 Admit Type: Outpatient Procedure:                Upper EUS Indications:              known metastatic Squamous Cell lung cancer, now                            with PET avid pancreatic head mass, no LFTs in over                            a year but does not appear jaundiced today Providers:                Milus Banister, MD, Benay Pillow, RN, Laverda Sorenson, Technician, Laureen Abrahams, Dellie Catholic Referring MD:             Illene Regulus, MD Medicines:                Monitored Anesthesia Care Complications:            No immediate complications. Estimated blood loss:                            None. Estimated Blood Loss:     Estimated blood loss: none. Procedure:                Pre-Anesthesia Assessment:                           - Prior to the procedure, a History and Physical                            was performed, and patient medications and                            allergies were reviewed. The patient's tolerance of                            previous anesthesia was also reviewed. The risks                            and benefits of the procedure and the sedation                            options and risks were discussed with the patient.                            All questions were answered, and informed consent                            was obtained. Prior Anticoagulants: The patient has  taken no previous anticoagulant or antiplatelet                            agents. ASA Grade Assessment: II - A patient with                            mild systemic disease. After reviewing the risks                            and benefits, the patient was deemed in                            satisfactory condition to undergo the procedure.                            After obtaining informed consent, the endoscope was                            passed under direct vision. Throughout the                            procedure, the patient's blood pressure, pulse, and                            oxygen saturations were monitored continuously. The                            [id number] ([id number]) Olympus Radial EUS was                            introduced through the mouth, and advanced to the                            second part of duodenum. The upper EUS was                            accomplished without difficulty. The patient                            tolerated the procedure well. Scope In: Scope Out: Findings:      ENDOSCOPIC FINDING(limited view with radial and linear echoendoscopes): :      The examined esophagus was endoscopically normal. No esophageal varices.      There were changes of mild portal gastropathy throughout the stomach       (proximal>distal). No gastric varices.      The examined duodenum was endoscopically normal.      ENDOSONOGRAPHIC FINDING: :      1. A round mass was identified in the pancreatic head. The mass was       hypoechoic and heterogenous. The mass measured 41 mm in maximal       cross-sectional diameter. The endosonographic borders were       poorly-defined. Fine needle aspiration for cytology was performed. Color       Doppler imaging was utilized prior to needle puncture to confirm a lack  of significant vascular structures within the needle path. Two passes       were made with the 22 gauge needle using a transduodenal approach. A       cytotechnologist was present to evaluate the adequacy of the specimen.       Final cytology results are pending.      2. The pancreatic parenchyma was otherwise normal.      3. Main pancreatic duct was normal, non-dilated.      4. CBD was normal, non-dilated.      5. No peripancreatic adenopathy.      6. Limited views of the liver, spleen, portal and splenic  vessels were       all normal. Impression:               - 4.1cm mass in the head of the pancreas that is                            not causing main pancreatic duct or biliary duct                            obstruction. Preliminary cytology review shows                            atypical appearing squamoid cells. Await final                            pathology testing however this is more likely to be                            a metastatic site from his known squamous lung                            cancer than a primary pancreatic tumor.                           - Portal gastropathy changes noted. No esophageal                            or gastric varices. Moderate Sedation:      Not Applicable - Patient had care per Anesthesia. Recommendation:           - Discharge patient to home. Procedure Code(s):        --- Professional ---                           [phone], Esophagogastroduodenoscopy, flexible,                            transoral; with transendoscopic ultrasound-guided                            intramural or transmural fine needle                            aspiration/biopsy(s), (includes endoscopic  ultrasound examination limited to the esophagus,                            stomach or duodenum, and adjacent structures) Diagnosis Code(s):        --- Professional ---                           K86.89, Other specified diseases of pancreas                           R93.5, Abnormal findings on diagnostic imaging of                            other abdominal regions, including retroperitoneum CPT copyright 2019 [employer]. All rights reserved. The codes documented in this report are preliminary and upon coder review may  be revised to meet current compliance requirements. Milus Banister, MD 10/28/2020 12:45:32 PM This report has been signed electronically. Number of Addenda: 0

## 2020-10-28 NOTE — Discharge Instructions (Signed)
YOU HAD AN ENDOSCOPIC PROCEDURE TODAY: Refer to the procedure report and other information in the discharge instructions given to you for any specific questions about what was found during the examination. If this information does not answer your questions, please call [place] office at [phone] to clarify.   YOU SHOULD EXPECT: Some feelings of bloating in the abdomen. Passage of more gas than usual. Walking can help get rid of the air that was put into your GI tract during the procedure and reduce the bloating. If you had a lower endoscopy (such as a colonoscopy or flexible sigmoidoscopy) you may notice spotting of blood in your stool or on the toilet paper. Some abdominal soreness may be present for a day or two, also.  DIET: Your first meal following the procedure should be a light meal and then it is ok to progress to your normal diet. A half-sandwich or bowl of soup is an example of a good first meal. Heavy or fried foods are harder to digest and may make you feel nauseous or bloated. Drink plenty of fluids but you should avoid alcoholic beverages for 24 hours. If you had a esophageal dilation, please see attached instructions for diet.    ACTIVITY: Your care partner should take you home directly after the procedure. You should plan to take it easy, moving slowly for the rest of the day. You can resume normal activity the day after the procedure however YOU SHOULD NOT DRIVE, use power tools, machinery or perform tasks that involve climbing or major physical exertion for 24 hours (because of the sedation medicines used during the test).   SYMPTOMS TO REPORT IMMEDIATELY: A gastroenterologist can be reached at any hour. Please call [phone]  for any of the following symptoms:   Following upper endoscopy (EGD, EUS, ERCP, esophageal dilation) Vomiting of blood or coffee ground material  New, significant abdominal pain  New, significant chest pain or pain under the shoulder blades  Painful or  persistently difficult swallowing  New shortness of breath  Black, tarry-looking or red, bloody stools  FOLLOW UP:  If any biopsies were taken you will be contacted by phone or by letter within the next 1-3 weeks. Call [phone]  if you have not heard about the biopsies in 3 weeks.  Please also call with any specific questions about appointments or follow up tests.  

## 2020-10-29 ENCOUNTER — Encounter (HOSPITAL_COMMUNITY): Payer: Self-pay | Admitting: Gastroenterology

## 2020-11-01 LAB — CYTOLOGY - NON PAP

## 2020-11-03 ENCOUNTER — Other Ambulatory Visit: Payer: Self-pay

## 2020-11-03 ENCOUNTER — Inpatient Hospital Stay: Payer: Medicare Other

## 2020-11-03 ENCOUNTER — Inpatient Hospital Stay: Payer: Medicare Other | Attending: Oncology | Admitting: Oncology

## 2020-11-03 VITALS — BP 115/79 | HR 114 | Temp 97.8°F | Resp 16 | Ht 64.0 in | Wt 156.6 lb

## 2020-11-03 DIAGNOSIS — C3432 Malignant neoplasm of lower lobe, left bronchus or lung: Secondary | ICD-10-CM | POA: Diagnosis present

## 2020-11-03 DIAGNOSIS — J449 Chronic obstructive pulmonary disease, unspecified: Secondary | ICD-10-CM | POA: Diagnosis not present

## 2020-11-03 DIAGNOSIS — Z5112 Encounter for antineoplastic immunotherapy: Secondary | ICD-10-CM | POA: Diagnosis present

## 2020-11-03 DIAGNOSIS — Z7189 Other specified counseling: Secondary | ICD-10-CM | POA: Diagnosis not present

## 2020-11-03 DIAGNOSIS — Z23 Encounter for immunization: Secondary | ICD-10-CM | POA: Insufficient documentation

## 2020-11-03 DIAGNOSIS — D649 Anemia, unspecified: Secondary | ICD-10-CM | POA: Insufficient documentation

## 2020-11-03 DIAGNOSIS — C7889 Secondary malignant neoplasm of other digestive organs: Secondary | ICD-10-CM | POA: Insufficient documentation

## 2020-11-03 DIAGNOSIS — R55 Syncope and collapse: Secondary | ICD-10-CM | POA: Diagnosis not present

## 2020-11-03 DIAGNOSIS — Z5111 Encounter for antineoplastic chemotherapy: Secondary | ICD-10-CM | POA: Insufficient documentation

## 2020-11-03 LAB — CMP (CANCER CENTER ONLY)
ALT: 20 U/L (ref 0–44)
AST: 37 U/L (ref 15–41)
Albumin: 3.3 g/dL — ABNORMAL LOW (ref 3.5–5.0)
Alkaline Phosphatase: 145 U/L — ABNORMAL HIGH (ref 38–126)
Anion gap: 11 (ref 5–15)
BUN: 6 mg/dL — ABNORMAL LOW (ref 8–23)
CO2: 20 mmol/L — ABNORMAL LOW (ref 22–32)
Calcium: 9.3 mg/dL (ref 8.9–10.3)
Chloride: 102 mmol/L (ref 98–111)
Creatinine: 0.81 mg/dL (ref 0.61–1.24)
GFR, Estimated: >60 mL/min (ref >60)
Glucose, Bld: 128 mg/dL — ABNORMAL HIGH (ref 70–99)
Potassium: 3.6 mmol/L (ref 3.5–5.1)
Sodium: 133 mmol/L — ABNORMAL LOW (ref 135–145)
Total Bilirubin: 0.5 mg/dL (ref 0.3–1.2)
Total Protein: 7.6 g/dL (ref 6.5–8.1)

## 2020-11-03 LAB — CBC WITH DIFFERENTIAL (CANCER CENTER ONLY)
Abs Immature Granulocytes: 0.02 10*3/uL (ref 0.00–0.07)
Basophils Absolute: 0.1 10*3/uL (ref 0.0–0.1)
Basophils Relative: 1 %
Eosinophils Absolute: 0.1 10*3/uL (ref 0.0–0.5)
Eosinophils Relative: 1 %
HCT: 40 % (ref 39.0–52.0)
Hemoglobin: 14.2 g/dL (ref 13.0–17.0)
Immature Granulocytes: 0 %
Lymphocytes Relative: 11 %
Lymphs Abs: 1.2 10*3/uL (ref 0.7–4.0)
MCH: 34.9 pg — ABNORMAL HIGH (ref 26.0–34.0)
MCHC: 35.5 g/dL (ref 30.0–36.0)
MCV: 98.3 fL (ref 80.0–100.0)
Monocytes Absolute: 1.1 10*3/uL — ABNORMAL HIGH (ref 0.1–1.0)
Monocytes Relative: 11 %
Neutro Abs: 8.1 10*3/uL — ABNORMAL HIGH (ref 1.7–7.7)
Neutrophils Relative %: 76 %
Platelet Count: 187 10*3/uL (ref 150–400)
RBC: 4.07 MIL/uL — ABNORMAL LOW (ref 4.22–5.81)
RDW: 12.3 % (ref 11.5–15.5)
WBC Count: 10.7 10*3/uL — ABNORMAL HIGH (ref 4.0–10.5)
nRBC: 0 % (ref 0.0–0.2)

## 2020-11-03 MED ORDER — DEXAMETHASONE 2 MG PO TABS: 10.0000 mg | ORAL_TABLET | Freq: Once | ORAL | 0 refills | Status: AC

## 2020-11-03 NOTE — Progress Notes (Signed)
   Covid-19 Vaccination Clinic  Name:  Ronald [name]    MRN: [id number] DOB: 21-Dec-1955  11/03/2020  [name] was observed post Covid-19 immunization for 15 minutes without incident. He was provided with Vaccine Information Sheet and instruction to access the V-Safe system.   [name] was instructed to call 911 with any severe reactions post vaccine: Marland Kitchen Difficulty breathing  . Swelling of face and throat  . A fast heartbeat  . A bad rash all over body  . Dizziness and weakness   Immunizations Administered    Name Date Dose VIS Date Route   Pfizer COVID-19 Vaccine 11/03/2020  9:45 AM 0.3 mL 08/18/2020 Intramuscular   Manufacturer: [place]   Lot: [id number]   [place]: [id number]

## 2020-11-03 NOTE — Progress Notes (Signed)
DISCONTINUE OFF PATHWAY REGIMEN - Non-Small Cell Lung   [id number]:Carboplatin + Paclitaxel (2/50) + RT weekly x 6 weeks:   Administer weekly during RT:     Paclitaxel      Carboplatin   **Always confirm dose/schedule in your pharmacy ordering system**  REASON: Disease Progression PRIOR TREATMENT: Off Pathway: Carboplatin + Paclitaxel (2/50) + RT weekly x 6 weeks TREATMENT RESPONSE: Unable to Evaluate  START ON PATHWAY REGIMEN - Non-Small Cell Lung     A cycle is every 21 days:     Pembrolizumab      Paclitaxel      Carboplatin   **Always confirm dose/schedule in your pharmacy ordering system**  Patient Characteristics: Stage IV Metastatic, Squamous, PS = 0, 1, First Line, PD-L1 Expression Positive 1-49% (TPS) / Negative / Not Tested / Awaiting Test Results and Immunotherapy Candidate Therapeutic Status: Stage IV Metastatic Histology: Squamous Cell Line of therapy: First Line ECOG Performance Status: 1 PD-L1 Expression Status: PD-L1 Negative Immunotherapy Candidate Status: Candidate for Immunotherapy Intent of Therapy: Non-Curative / Palliative Intent, Discussed with Patient

## 2020-11-03 NOTE — Progress Notes (Signed)
[place] OFFICE PROGRESS NOTE   Diagnosis: Non-small cell lung cancer  INTERVAL HISTORY:   Ronald [name] returns for a scheduled visit.  He underwent an EUS and biopsy of the pancreas mass on 10/28/2020. He feels well. He has an intermittent cough. He had an episode of heavy coughing yesterday followed by presyncope. He uses a nebulizer daily. He reports dyspnea has improved since he underwent the bronchoscopy procedure. No other complaint. Ronald [name] is here today with his sister. He drinks approximately 12 beers per day.  Objective:  Vital signs in last 24 hours:  Blood pressure 115/79, pulse (!) 114, temperature 97.8 F (36.6 C), temperature source Tympanic, resp. rate 16, height '5\' 4"'  (1.626 m), weight 156 lb 9.6 oz (71 kg), SpO2 98 %.   Lymphatics: No cervical, supraclavicular, axillary, or inguinal nodes Resp: Bilateral inspiratory/expiratory rhonchi and wheezes, no respiratory distress Cardio: Regular rate and rhythm GI: No hepatosplenomegaly, no mass, nontender Vascular: No leg edema Neuro: Alert and oriented     Lab Results:  Lab Results  Component Value Date   WBC 8.4 10/12/2020   HGB 13.8 10/12/2020   HCT 41.2 10/12/2020   MCV 103.8 (H) 10/12/2020   PLT 172 10/12/2020   NEUTROABS 2.9 03/28/2019    CMP  Lab Results  Component Value Date   NA 124 (L) 10/12/2020   K 4.1 10/12/2020   CL 87 (L) 10/12/2020   CO2 20 (L) 10/12/2020   GLUCOSE 114 (H) 10/12/2020   BUN 5 (L) 10/12/2020   CREATININE 0.70 10/12/2020   CALCIUM 8.3 (L) 10/12/2020   PROT 7.7 04/11/2019   ALBUMIN 3.7 04/11/2019   AST 104 (H) 04/11/2019   ALT 54 (H) 04/11/2019   ALKPHOS 139 (H) 04/11/2019   BILITOT 0.5 04/11/2019   GFRNONAA >60 10/12/2020   GFRAA >60 04/11/2019    Medications: I have reviewed the patient's current medications.   Assessment/Plan:  Non-small cell lung cancer-squamous cell carcinoma  CT chest 09/19/2018 concerning for an obstructing left lower lobe  mass, small left pleural effusion, 4 mm right lower lobe nodule  CT head 09/20/2018-no acute abnormality  Bronchoscopy 10/02/2018-complete occlusion of the left lower lobe with an obstructing mass, biopsies, washing, and brushing reveal squamous cell carcinoma, biopsies of level 7 and 4L nodes-negative  PET scan 10/08/2018-hypermetabolic left hilar mass obstructing the left lower lobe bronchus with left lower lobe collapse, no evidence of metastatic lymphadenopathy or distant metastases  Clinical stage Ib (T2N0)  Radiation beginning 11/19/2018  Cycle 1 weekly Taxol/carboplatin 11/22/2018  Cycle 2 weekly Taxol/carboplatin 11/29/2018  Cycle 3 weekly Taxol/carboplatin2/04/2019  Cycle 4 weekly Taxol/carboplatin 12/13/2018  Cycle 5 weekly Taxol/carboplatin 12/20/2018  Cycle 6 weekly Taxol/carboplatin 12/27/2018  CT 02/24/2019- stable left lower lobe collapse, decreased left pleural effusion, abnormal soft tissue in the inferior left hilum  Durvalumab 03/28/2019 and 04/11/2019, patient then lost to follow-up  PET 09/30/2020-residual/recurrent disease of the left hilum with associated basilar collapse, slight increase in pleural fluid with small area of nodularity with equivocal uptake at the peripher of consolidated lung, hypermetabolic pancreas head mass, mild asymmetry at the left glossotonsillar region, cirrhosis  10/12/2020 bronchoscopy with cryotherapy, tumor debulking, lesional excision, and balloon dilatation-occluding tumor was noted in the left lower lobe with a small visible opening of the left upper lobe anterior segment, tumor occluded the lingula, no visible opening of the left lower lobe, the left mainstem bifurcation was not recognized due to necrotic tumor, biopsies were taken of level 7 nodes, tumor  was debulked from the left upper lobe orifice and cryotherapy/dilation procedures were performed, the pathology revealed squamous cell carcinoma involving an endobronchial biopsy, the  biopsy from the level 7 node revealed no malignant cells.  PD-L1 0%  EUS 10/28/2020-no esophageal varices, mild portal gastropathy, no gastric varices, 4.1 cm round mass in the pancreas head, a pancreatic and common bile duct were nondilated, no peripancreatic adenopathy, FNA biopsy-metastatic squamous cell carcinoma 2.Tobacco and alcohol use 3.COPD 4.Weight loss 5.Anemia 6.History of tuberculosis 7.Changes of cirrhosis on the PET scan 10/08/2018 8.  Hypermetabolic pancreas head mass on PET scan 09/30/2020     Disposition: Ronald [name] has been diagnosed with metastatic non-small cell lung cancer. He was confirmed to have recurrent disease in the left chest on bronchoscopy last month. Biopsy of the pancreas mass reveals metastatic lung cancer.  No therapy will be curative. I discussed treatment options with Ronald [name] and his sister. I recommend treatment with Taxol/carboplatin/pembrolizumab. We reviewed potential toxicities associated with this regimen including the chance of an allergic reaction, bone pain, neuropathy, and alopecia with Taxol. We discussed the chance of allergic reaction nausea/vomiting with carboplatin. We discussed the potential for hematologic toxicity. We reviewed the rash, diarrhea, and various autoimmune toxicities associated with PD1 inhibitors. He agrees to proceed.  Ronald [name] will be scheduled for cycle one Taxol/carboplatin/pembrolizumab on 11/12/2020. He will return for a nadir CBC. He will be scheduled for an office visit and cycle two chemotherapy on 12/03/2020.  A chemotherapy plan was entered today.  I encouraged him to decrease his alcohol intake.  Betsy Coder, MD  11/03/2020  9:44 AM

## 2020-11-04 ENCOUNTER — Telehealth: Payer: Self-pay | Admitting: Oncology

## 2020-11-04 NOTE — Telephone Encounter (Signed)
Scheduled appointments per 1/5 los. Spoke to patient's sister who is aware of appointments dates and times.

## 2020-11-05 NOTE — Progress Notes (Signed)
Pharmacist Chemotherapy Monitoring - Initial Assessment    Anticipated start date: 11/12/20  Regimen:  . Are orders appropriate based on the patient's diagnosis, regimen, and cycle? Yes . Does the plan date match the patient's scheduled date? Yes . Is the sequencing of drugs appropriate? Yes . Are the premedications appropriate for the patient's regimen? Yes . Prior Authorization for treatment is: Approved o If applicable, is the correct biosimilar selected based on the patient's insurance? yes  Organ Function and Labs: Marland Kitchen Are dose adjustments needed based on the patient's renal function, hepatic function, or hematologic function? Yes . Are appropriate labs ordered prior to the start of patient's treatment? Yes . Other organ system assessment, if indicated: N/A . The following baseline labs, if indicated, have been ordered: pembrolizumab: baseline TSH +/- T4 and zoledronic acid: SCr, Calcium  Dose Assessment: . Are the drug doses appropriate? Yes . Are the following correct: o Drug concentrations Yes o IV fluid compatible with drug Yes o Administration routes Yes o Timing of therapy Yes . If applicable, does the patient have documented access for treatment and/or plans for port-a-cath placement? yes . If applicable, have lifetime cumulative doses been properly documented and assessed? yes Lifetime Dose Tracking  . Carboplatin: 1,180 mg = 0.01 % of the maximum lifetime dose of 999,999,999 mg  o   Toxicity Monitoring/Prevention: . The patient has the following take home antiemetics prescribed: N/A . The patient has the following take home medications prescribed: N/A . Medication allergies and previous infusion related reactions, if applicable, have been reviewed and addressed. Yes . The patient's current medication list has been assessed for drug-drug interactions with their chemotherapy regimen. no significant drug-drug interactions were identified on review.  Order Review: . Are  the treatment plan orders signed? No . Is the patient scheduled to see a provider prior to their treatment? Yes  I verify that I have reviewed each item in the above checklist and answered each question accordingly.  Philomena Course 11/05/2020 10:13 AM

## 2020-11-07 ENCOUNTER — Other Ambulatory Visit: Payer: Self-pay | Admitting: Oncology

## 2020-11-09 LAB — FUNGUS CULTURE WITH STAIN

## 2020-11-09 LAB — FUNGUS CULTURE RESULT

## 2020-11-09 LAB — FUNGAL ORGANISM REFLEX

## 2020-11-12 ENCOUNTER — Inpatient Hospital Stay: Payer: Medicare Other

## 2020-11-12 ENCOUNTER — Other Ambulatory Visit: Payer: Self-pay

## 2020-11-12 ENCOUNTER — Other Ambulatory Visit: Payer: Self-pay | Admitting: *Deleted

## 2020-11-12 VITALS — BP 126/71 | HR 93 | Temp 98.3°F | Resp 18 | Wt 156.5 lb

## 2020-11-12 DIAGNOSIS — C3432 Malignant neoplasm of lower lobe, left bronchus or lung: Secondary | ICD-10-CM

## 2020-11-12 DIAGNOSIS — Z5112 Encounter for antineoplastic immunotherapy: Secondary | ICD-10-CM | POA: Diagnosis not present

## 2020-11-12 DIAGNOSIS — R5383 Other fatigue: Secondary | ICD-10-CM

## 2020-11-12 DIAGNOSIS — Z23 Encounter for immunization: Secondary | ICD-10-CM | POA: Diagnosis not present

## 2020-11-12 MED ORDER — ONDANSETRON HCL 8 MG PO TABS: 8.0000 mg | ORAL_TABLET | Freq: Three times a day (TID) | ORAL | 1 refills | Status: DC | PRN

## 2020-11-12 MED ORDER — PROCHLORPERAZINE MALEATE 10 MG PO TABS: 10.0000 mg | ORAL_TABLET | Freq: Four times a day (QID) | ORAL | 1 refills | Status: DC | PRN

## 2020-11-12 MED ORDER — FAMOTIDINE IN NACL 20-0.9 MG/50ML-% IV SOLN
INTRAVENOUS | Status: AC
  Filled 2020-11-12: qty 50

## 2020-11-12 MED ORDER — DIPHENHYDRAMINE HCL 50 MG/ML IJ SOLN
INTRAMUSCULAR | Status: AC
  Filled 2020-11-12: qty 1

## 2020-11-12 MED ORDER — SODIUM CHLORIDE 0.9 % IV SOLN
150.0000 mg | Freq: Once | INTRAVENOUS | Status: AC
  Administered 2020-11-12: 150 mg via INTRAVENOUS
  Filled 2020-11-12: qty 150

## 2020-11-12 MED ORDER — PALONOSETRON HCL INJECTION 0.25 MG/5ML
0.2500 mg | Freq: Once | INTRAVENOUS | Status: AC
  Administered 2020-11-12: 0.25 mg via INTRAVENOUS

## 2020-11-12 MED ORDER — PEMBROLIZUMAB CHEMO INJECTION 100 MG/4ML
200.0000 mg | Freq: Once | INTRAVENOUS | Status: AC
  Administered 2020-11-12: 200 mg via INTRAVENOUS
  Filled 2020-11-12: qty 8

## 2020-11-12 MED ORDER — PALONOSETRON HCL INJECTION 0.25 MG/5ML
INTRAVENOUS | Status: AC
  Filled 2020-11-12: qty 5

## 2020-11-12 MED ORDER — SODIUM CHLORIDE 0.9 % IV SOLN
Freq: Once | INTRAVENOUS | Status: AC
  Filled 2020-11-12: qty 250

## 2020-11-12 MED ORDER — DIPHENHYDRAMINE HCL 50 MG/ML IJ SOLN
50.0000 mg | Freq: Once | INTRAMUSCULAR | Status: AC
  Administered 2020-11-12: 50 mg via INTRAVENOUS

## 2020-11-12 MED ORDER — SODIUM CHLORIDE 0.9 % IV SOLN
10.0000 mg | Freq: Once | INTRAVENOUS | Status: AC
  Administered 2020-11-12: 10 mg via INTRAVENOUS
  Filled 2020-11-12: qty 10

## 2020-11-12 MED ORDER — SODIUM CHLORIDE 0.9 % IV SOLN
490.0000 mg | Freq: Once | INTRAVENOUS | Status: AC
  Administered 2020-11-12: 490 mg via INTRAVENOUS
  Filled 2020-11-12: qty 49

## 2020-11-12 MED ORDER — SODIUM CHLORIDE 0.9 % IV SOLN
175.0000 mg/m2 | Freq: Once | INTRAVENOUS | Status: AC
  Administered 2020-11-12: 312 mg via INTRAVENOUS
  Filled 2020-11-12: qty 52

## 2020-11-12 MED ORDER — FAMOTIDINE IN NACL 20-0.9 MG/50ML-% IV SOLN
20.0000 mg | Freq: Once | INTRAVENOUS | Status: AC
  Administered 2020-11-12: 20 mg via INTRAVENOUS

## 2020-11-12 NOTE — Patient Instructions (Addendum)
[facility] Discharge Instructions for Patients Receiving Chemotherapy  Today you received the following chemotherapy agents:  Keytruda/Taxol/Carboplatin.  To help prevent nausea and vomiting after your treatment, we encourage you to take your nausea medication as directed.   If you develop nausea and vomiting that is not controlled by your nausea medication, call the clinic.   BELOW ARE SYMPTOMS THAT SHOULD BE REPORTED IMMEDIATELY:  *FEVER GREATER THAN 100.5 F  *CHILLS WITH OR WITHOUT FEVER  NAUSEA AND VOMITING THAT IS NOT CONTROLLED WITH YOUR NAUSEA MEDICATION  *UNUSUAL SHORTNESS OF BREATH  *UNUSUAL BRUISING OR BLEEDING  TENDERNESS IN MOUTH AND THROAT WITH OR WITHOUT PRESENCE OF ULCERS  *URINARY PROBLEMS  *BOWEL PROBLEMS  UNUSUAL RASH Items with * indicate a potential emergency and should be followed up as soon as possible.  Feel free to call the clinic should you have any questions or concerns. The clinic phone number is [phone].  Please show the [facility] at check-in to the Emergency Department and triage nurse.  Pembrolizumab injection What is this medicine? PEMBROLIZUMAB (pem broe liz ue mab) is a monoclonal antibody. It is used to treat certain types of cancer. This medicine may be used for other purposes; ask your health care provider or pharmacist if you have questions. COMMON BRAND NAME(S): Keytruda What should I tell my health care provider before I take this medicine? They need to know if you have any of these conditions:  autoimmune diseases like Crohn's disease, ulcerative colitis, or lupus  have had or planning to have an allogeneic stem cell transplant (uses someone else's stem cells)  history of organ transplant  history of chest radiation  nervous system problems like myasthenia gravis or Guillain-Barre syndrome  an unusual or allergic reaction to pembrolizumab, other medicines, foods, dyes, or preservatives  pregnant  or trying to get pregnant  breast-feeding How should I use this medicine? This medicine is for infusion into a vein. It is given by a health care professional in a hospital or clinic setting. A special MedGuide will be given to you before each treatment. Be sure to read this information carefully each time. Talk to your pediatrician regarding the use of this medicine in children. While this drug may be prescribed for children as young as 6 months for selected conditions, precautions do apply. Overdosage: If you think you have taken too much of this medicine contact a poison control center or emergency room at once. NOTE: This medicine is only for you. Do not share this medicine with others. What if I miss a dose? It is important not to miss your dose. Call your doctor or health care professional if you are unable to keep an appointment. What may interact with this medicine? Interactions have not been studied. This list may not describe all possible interactions. Give your health care provider a list of all the medicines, herbs, non-prescription drugs, or dietary supplements you use. Also tell them if you smoke, drink alcohol, or use illegal drugs. Some items may interact with your medicine. What should I watch for while using this medicine? Your condition will be monitored carefully while you are receiving this medicine. You may need blood work done while you are taking this medicine. Do not become pregnant while taking this medicine or for 4 months after stopping it. Women should inform their doctor if they wish to become pregnant or think they might be pregnant. There is a potential for serious side effects to an unborn child. Talk  to your health care professional or pharmacist for more information. Do not breast-feed an infant while taking this medicine or for 4 months after the last dose. What side effects may I notice from receiving this medicine? Side effects that you should report to your  doctor or health care professional as soon as possible:  allergic reactions like skin rash, itching or hives, swelling of the face, lips, or tongue  bloody or black, tarry  breathing problems  changes in vision  chest pain  chills  confusion  constipation  cough  diarrhea  dizziness or feeling faint or lightheaded  fast or irregular heartbeat  fever  flushing  joint pain  low blood counts - this medicine may decrease the number of white blood cells, red blood cells and platelets. You may be at increased risk for infections and bleeding.  muscle pain  muscle weakness  pain, tingling, numbness in the hands or feet  persistent headache  redness, blistering, peeling or loosening of the skin, including inside the mouth  signs and symptoms of high blood sugar such as dizziness; dry mouth; dry skin; fruity breath; nausea; stomach pain; increased hunger or thirst; increased urination  signs and symptoms of kidney injury like trouble passing urine or change in the amount of urine  signs and symptoms of liver injury like dark urine, light-colored stools, loss of appetite, nausea, right upper belly pain, yellowing of the eyes or skin  sweating  swollen lymph nodes  weight loss Side effects that usually do not require medical attention (report to your doctor or health care professional if they continue or are bothersome):  decreased appetite  hair loss  tiredness This list may not describe all possible side effects. Call your doctor for medical advice about side effects. You may report side effects to FDA at 1-800-FDA-1088. Where should I keep my medicine? This drug is given in a hospital or clinic and will not be stored at home. NOTE: This sheet is a summary. It may not cover all possible information. If you have questions about this medicine, talk to your doctor, pharmacist, or health care provider.  2021 Elsevier/Gold Standard (2019-09-17  21:44:53)  Paclitaxel injection What is this medicine? PACLITAXEL (PAK li TAX el) is a chemotherapy drug. It targets fast dividing cells, like cancer cells, and causes these cells to die. This medicine is used to treat ovarian cancer, breast cancer, lung cancer, Kaposi's sarcoma, and other cancers. This medicine may be used for other purposes; ask your health care provider or pharmacist if you have questions. COMMON BRAND NAME(S): Onxol, Taxol What should I tell my health care provider before I take this medicine? They need to know if you have any of these conditions:  history of irregular heartbeat  liver disease  low blood counts, like low white cell, platelet, or red cell counts  lung or breathing disease, like asthma  tingling of the fingers or toes, or other nerve disorder  an unusual or allergic reaction to paclitaxel, alcohol, polyoxyethylated castor oil, other chemotherapy, other medicines, foods, dyes, or preservatives  pregnant or trying to get pregnant  breast-feeding How should I use this medicine? This drug is given as an infusion into a vein. It is administered in a hospital or clinic by a specially trained health care professional. Talk to your pediatrician regarding the use of this medicine in children. Special care may be needed. Overdosage: If you think you have taken too much of this medicine contact a poison control center or  emergency room at once. NOTE: This medicine is only for you. Do not share this medicine with others. What if I miss a dose? It is important not to miss your dose. Call your doctor or health care professional if you are unable to keep an appointment. What may interact with this medicine? Do not take this medicine with any of the following medications:  live virus vaccines This medicine may also interact with the following medications:  antiviral medicines for hepatitis, HIV or AIDS  certain antibiotics like erythromycin and  clarithromycin  certain medicines for fungal infections like ketoconazole and itraconazole  certain medicines for seizures like carbamazepine, phenobarbital, phenytoin  gemfibrozil  nefazodone  rifampin  St. John's wort This list may not describe all possible interactions. Give your health care provider a list of all the medicines, herbs, non-prescription drugs, or dietary supplements you use. Also tell them if you smoke, drink alcohol, or use illegal drugs. Some items may interact with your medicine. What should I watch for while using this medicine? Your condition will be monitored carefully while you are receiving this medicine. You will need important blood work done while you are taking this medicine. This medicine can cause serious allergic reactions. To reduce your risk you will need to take other medicine(s) before treatment with this medicine. If you experience allergic reactions like skin rash, itching or hives, swelling of the face, lips, or tongue, tell your doctor or health care professional right away. In some cases, you may be given additional medicines to help with side effects. Follow all directions for their use. This drug may make you feel generally unwell. This is not uncommon, as chemotherapy can affect healthy cells as well as cancer cells. Report any side effects. Continue your course of treatment even though you feel ill unless your doctor tells you to stop. Call your doctor or health care professional for advice if you get a fever, chills or sore throat, or other symptoms of a cold or flu. Do not treat yourself. This drug decreases your body's ability to fight infections. Try to avoid being around people who are sick. This medicine may increase your risk to bruise or bleed. Call your doctor or health care professional if you notice any unusual bleeding. Be careful brushing and flossing your teeth or using a toothpick because you may get an infection or bleed more easily.  If you have any dental work done, tell your dentist you are receiving this medicine. Avoid taking products that contain aspirin, acetaminophen, ibuprofen, naproxen, or ketoprofen unless instructed by your doctor. These medicines may hide a fever. Do not become pregnant while taking this medicine. Women should inform their doctor if they wish to become pregnant or think they might be pregnant. There is a potential for serious side effects to an unborn child. Talk to your health care professional or pharmacist for more information. Do not breast-feed an infant while taking this medicine. Men are advised not to father a child while receiving this medicine. This product may contain alcohol. Ask your pharmacist or healthcare provider if this medicine contains alcohol. Be sure to tell all healthcare providers you are taking this medicine. Certain medicines, like metronidazole and disulfiram, can cause an unpleasant reaction when taken with alcohol. The reaction includes flushing, headache, nausea, vomiting, sweating, and increased thirst. The reaction can last from 30 minutes to several hours. What side effects may I notice from receiving this medicine? Side effects that you should report to your doctor or health  care professional as soon as possible:  allergic reactions like skin rash, itching or hives, swelling of the face, lips, or tongue  breathing problems  changes in vision  fast, irregular heartbeat  high or low blood pressure  mouth sores  pain, tingling, numbness in the hands or feet  signs of decreased platelets or bleeding - bruising, pinpoint red spots on the skin, black, tarry stools, blood in the urine  signs of decreased red blood cells - unusually weak or tired, feeling faint or lightheaded, falls  signs of infection - fever or chills, cough, sore throat, pain or difficulty passing urine  signs and symptoms of liver injury like dark yellow or brown urine; general ill feeling or  flu-like symptoms; light-colored stools; loss of appetite; nausea; right upper belly pain; unusually weak or tired; yellowing of the eyes or skin  swelling of the ankles, feet, hands  unusually slow heartbeat Side effects that usually do not require medical attention (report to your doctor or health care professional if they continue or are bothersome):  diarrhea  hair loss  loss of appetite  muscle or joint pain  nausea, vomiting  pain, redness, or irritation at site where injected  tiredness This list may not describe all possible side effects. Call your doctor for medical advice about side effects. You may report side effects to FDA at 1-800-FDA-1088. Where should I keep my medicine? This drug is given in a hospital or clinic and will not be stored at home. NOTE: This sheet is a summary. It may not cover all possible information. If you have questions about this medicine, talk to your doctor, pharmacist, or health care provider.  2021 Elsevier/Gold Standard (2019-09-17 13:37:23)  Carboplatin injection What is this medicine? CARBOPLATIN (KAR boe pla tin) is a chemotherapy drug. It targets fast dividing cells, like cancer cells, and causes these cells to die. This medicine is used to treat ovarian cancer and many other cancers. This medicine may be used for other purposes; ask your health care provider or pharmacist if you have questions. COMMON BRAND NAME(S): Paraplatin What should I tell my health care provider before I take this medicine? They need to know if you have any of these conditions:  blood disorders  hearing problems  kidney disease  recent or ongoing radiation therapy  an unusual or allergic reaction to carboplatin, cisplatin, other chemotherapy, other medicines, foods, dyes, or preservatives  pregnant or trying to get pregnant  breast-feeding How should I use this medicine? This drug is usually given as an infusion into a vein. It is administered in a  hospital or clinic by a specially trained health care professional. Talk to your pediatrician regarding the use of this medicine in children. Special care may be needed. Overdosage: If you think you have taken too much of this medicine contact a poison control center or emergency room at once. NOTE: This medicine is only for you. Do not share this medicine with others. What if I miss a dose? It is important not to miss a dose. Call your doctor or health care professional if you are unable to keep an appointment. What may interact with this medicine?  medicines for seizures  medicines to increase blood counts like filgrastim, pegfilgrastim, sargramostim  some antibiotics like amikacin, gentamicin, neomycin, streptomycin, tobramycin  vaccines Talk to your doctor or health care professional before taking any of these medicines:  acetaminophen  aspirin  ibuprofen  ketoprofen  naproxen This list may not describe all possible interactions.  Give your health care provider a list of all the medicines, herbs, non-prescription drugs, or dietary supplements you use. Also tell them if you smoke, drink alcohol, or use illegal drugs. Some items may interact with your medicine. What should I watch for while using this medicine? Your condition will be monitored carefully while you are receiving this medicine. You will need important blood work done while you are taking this medicine. This drug may make you feel generally unwell. This is not uncommon, as chemotherapy can affect healthy cells as well as cancer cells. Report any side effects. Continue your course of treatment even though you feel ill unless your doctor tells you to stop. In some cases, you may be given additional medicines to help with side effects. Follow all directions for their use. Call your doctor or health care professional for advice if you get a fever, chills or sore throat, or other symptoms of a cold or flu. Do not treat  yourself. This drug decreases your body's ability to fight infections. Try to avoid being around people who are sick. This medicine may increase your risk to bruise or bleed. Call your doctor or health care professional if you notice any unusual bleeding. Be careful brushing and flossing your teeth or using a toothpick because you may get an infection or bleed more easily. If you have any dental work done, tell your dentist you are receiving this medicine. Avoid taking products that contain aspirin, acetaminophen, ibuprofen, naproxen, or ketoprofen unless instructed by your doctor. These medicines may hide a fever. Do not become pregnant while taking this medicine. Women should inform their doctor if they wish to become pregnant or think they might be pregnant. There is a potential for serious side effects to an unborn child. Talk to your health care professional or pharmacist for more information. Do not breast-feed an infant while taking this medicine. What side effects may I notice from receiving this medicine? Side effects that you should report to your doctor or health care professional as soon as possible:  allergic reactions like skin rash, itching or hives, swelling of the face, lips, or tongue  signs of infection - fever or chills, cough, sore throat, pain or difficulty passing urine  signs of decreased platelets or bleeding - bruising, pinpoint red spots on the skin, black, tarry stools, nosebleeds  signs of decreased red blood cells - unusually weak or tired, fainting spells, lightheadedness  breathing problems  changes in hearing  changes in vision  chest pain  high blood pressure  low blood counts - This drug may decrease the number of white blood cells, red blood cells and platelets. You may be at increased risk for infections and bleeding.  nausea and vomiting  pain, swelling, redness or irritation at the injection site  pain, tingling, numbness in the hands or  feet  problems with balance, talking, walking  trouble passing urine or change in the amount of urine Side effects that usually do not require medical attention (report to your doctor or health care professional if they continue or are bothersome):  hair loss  loss of appetite  metallic taste in the mouth or changes in taste This list may not describe all possible side effects. Call your doctor for medical advice about side effects. You may report side effects to FDA at 1-800-FDA-1088. Where should I keep my medicine? This drug is given in a hospital or clinic and will not be stored at home. NOTE: This sheet is a summary.  It may not cover all possible information. If you have questions about this medicine, talk to your doctor, pharmacist, or health care provider.  2021 Elsevier/Gold Standard (2008-01-21 14:38:05)

## 2020-11-12 NOTE — Progress Notes (Signed)
Per Dr. Benay Spice, okay to treat with labs from 11/03/2020.  Per Dr. Benay Spice, okay to treat with HR 120 with monitoring from RN.

## 2020-11-12 NOTE — Telephone Encounter (Signed)
Infusion nurse reports he has no anti-emetics at home. Orders placed. Also placed order for TSH (baseline) on 12/03/20

## 2020-11-15 ENCOUNTER — Telehealth: Payer: Self-pay

## 2020-11-15 NOTE — Telephone Encounter (Signed)
Called to check on how pt was doing after tx last week. Pt not available but sister stated he was doing great. No side effects at all. Pt to call back if he has any problems or questions .

## 2020-11-22 ENCOUNTER — Telehealth: Payer: Self-pay

## 2020-11-22 ENCOUNTER — Other Ambulatory Visit: Payer: Self-pay

## 2020-11-22 ENCOUNTER — Inpatient Hospital Stay: Payer: Medicare Other

## 2020-11-22 ENCOUNTER — Other Ambulatory Visit: Payer: Self-pay | Admitting: *Deleted

## 2020-11-22 DIAGNOSIS — C3432 Malignant neoplasm of lower lobe, left bronchus or lung: Secondary | ICD-10-CM

## 2020-11-22 DIAGNOSIS — Z5112 Encounter for antineoplastic immunotherapy: Secondary | ICD-10-CM | POA: Diagnosis not present

## 2020-11-22 DIAGNOSIS — Z23 Encounter for immunization: Secondary | ICD-10-CM | POA: Diagnosis not present

## 2020-11-22 LAB — CBC WITH DIFFERENTIAL (CANCER CENTER ONLY)
Abs Immature Granulocytes: 0 10*3/uL (ref 0.00–0.07)
Basophils Absolute: 0 10*3/uL (ref 0.0–0.1)
Basophils Relative: 2 %
Eosinophils Absolute: 0 10*3/uL (ref 0.0–0.5)
Eosinophils Relative: 3 %
HCT: 34.2 % — ABNORMAL LOW (ref 39.0–52.0)
Hemoglobin: 12.7 g/dL — ABNORMAL LOW (ref 13.0–17.0)
Immature Granulocytes: 0 %
Lymphocytes Relative: 41 %
Lymphs Abs: 0.5 10*3/uL — ABNORMAL LOW (ref 0.7–4.0)
MCH: 34.7 pg — ABNORMAL HIGH (ref 26.0–34.0)
MCHC: 37.1 g/dL — ABNORMAL HIGH (ref 30.0–36.0)
MCV: 93.4 fL (ref 80.0–100.0)
Monocytes Absolute: 0.6 10*3/uL (ref 0.1–1.0)
Monocytes Relative: 52 %
Neutro Abs: 0 10*3/uL — CL (ref 1.7–7.7)
Neutrophils Relative %: 2 %
Platelet Count: 70 10*3/uL — ABNORMAL LOW (ref 150–400)
RBC: 3.66 MIL/uL — ABNORMAL LOW (ref 4.22–5.81)
RDW: 11.7 % (ref 11.5–15.5)
WBC Count: 1.2 10*3/uL — ABNORMAL LOW (ref 4.0–10.5)
nRBC: 0 % (ref 0.0–0.2)

## 2020-11-22 NOTE — Telephone Encounter (Signed)
-----   Message from Ladell Pier, MD sent at 11/22/2020  1:51 PM EST ----- Please call patient, neutrophil count is very low, start ciprofloxacin 500 mg twice daily, repeat CBC 11/25/2020, call for fever or symptoms of infection

## 2020-11-22 NOTE — Telephone Encounter (Signed)
Called made pt and wife aware of new ABT therapy cipro 500mg  1 tab po BID x 7 days with repeat labs 11/25/20

## 2020-11-23 ENCOUNTER — Telehealth: Payer: Self-pay

## 2020-11-23 NOTE — Telephone Encounter (Signed)
Medication cCipro 500mg  take 1 tab BID x7days was called in and completed by pharmacy and Trae at pharmacy confirms medications is ready for pick up

## 2020-11-23 NOTE — Telephone Encounter (Signed)
Pt's sister contacted to make sure pt's abx prescription gets picked up and started today.  Confirmed that pt has upcoming lab appointment.

## 2020-11-25 ENCOUNTER — Other Ambulatory Visit: Payer: Self-pay

## 2020-11-25 ENCOUNTER — Inpatient Hospital Stay: Payer: Medicare Other

## 2020-11-25 DIAGNOSIS — Z23 Encounter for immunization: Secondary | ICD-10-CM | POA: Diagnosis not present

## 2020-11-25 DIAGNOSIS — C3432 Malignant neoplasm of lower lobe, left bronchus or lung: Secondary | ICD-10-CM

## 2020-11-25 DIAGNOSIS — Z5112 Encounter for antineoplastic immunotherapy: Secondary | ICD-10-CM | POA: Diagnosis not present

## 2020-11-25 LAB — CBC WITH DIFFERENTIAL (CANCER CENTER ONLY)
Abs Immature Granulocytes: 0.08 10*3/uL — ABNORMAL HIGH (ref 0.00–0.07)
Basophils Absolute: 0 10*3/uL (ref 0.0–0.1)
Basophils Relative: 1 %
Eosinophils Absolute: 0.1 10*3/uL (ref 0.0–0.5)
Eosinophils Relative: 1 %
HCT: 36.4 % — ABNORMAL LOW (ref 39.0–52.0)
Hemoglobin: 13.2 g/dL (ref 13.0–17.0)
Immature Granulocytes: 2 %
Lymphocytes Relative: 14 %
Lymphs Abs: 0.6 10*3/uL — ABNORMAL LOW (ref 0.7–4.0)
MCH: 33.8 pg (ref 26.0–34.0)
MCHC: 36.3 g/dL — ABNORMAL HIGH (ref 30.0–36.0)
MCV: 93.3 fL (ref 80.0–100.0)
Monocytes Absolute: 1.2 10*3/uL — ABNORMAL HIGH (ref 0.1–1.0)
Monocytes Relative: 25 %
Neutro Abs: 2.6 10*3/uL (ref 1.7–7.7)
Neutrophils Relative %: 57 %
Platelet Count: 99 10*3/uL — ABNORMAL LOW (ref 150–400)
RBC: 3.9 MIL/uL — ABNORMAL LOW (ref 4.22–5.81)
RDW: 11.2 % — ABNORMAL LOW (ref 11.5–15.5)
WBC Count: 4.6 10*3/uL (ref 4.0–10.5)
nRBC: 0 % (ref 0.0–0.2)

## 2020-11-25 LAB — ACID FAST CULTURE WITH REFLEXED SENSITIVITIES (MYCOBACTERIA): Acid Fast Culture: NEGATIVE

## 2020-11-26 ENCOUNTER — Other Ambulatory Visit: Payer: Self-pay | Admitting: Oncology

## 2020-12-03 ENCOUNTER — Inpatient Hospital Stay: Payer: Medicare Other

## 2020-12-03 ENCOUNTER — Other Ambulatory Visit: Payer: Self-pay

## 2020-12-03 ENCOUNTER — Telehealth: Payer: Self-pay | Admitting: Nurse Practitioner

## 2020-12-03 ENCOUNTER — Inpatient Hospital Stay: Payer: Medicare Other | Attending: Oncology | Admitting: Nurse Practitioner

## 2020-12-03 ENCOUNTER — Other Ambulatory Visit: Payer: Self-pay | Admitting: Nurse Practitioner

## 2020-12-03 ENCOUNTER — Encounter: Payer: Self-pay | Admitting: Nurse Practitioner

## 2020-12-03 ENCOUNTER — Telehealth: Payer: Self-pay | Admitting: *Deleted

## 2020-12-03 VITALS — BP 127/85 | HR 99 | Temp 97.8°F | Resp 20 | Ht 64.0 in | Wt 154.7 lb

## 2020-12-03 DIAGNOSIS — R634 Abnormal weight loss: Secondary | ICD-10-CM | POA: Diagnosis not present

## 2020-12-03 DIAGNOSIS — Z5189 Encounter for other specified aftercare: Secondary | ICD-10-CM | POA: Insufficient documentation

## 2020-12-03 DIAGNOSIS — R5383 Other fatigue: Secondary | ICD-10-CM

## 2020-12-03 DIAGNOSIS — Z5111 Encounter for antineoplastic chemotherapy: Secondary | ICD-10-CM | POA: Diagnosis present

## 2020-12-03 DIAGNOSIS — K869 Disease of pancreas, unspecified: Secondary | ICD-10-CM | POA: Diagnosis not present

## 2020-12-03 DIAGNOSIS — C3432 Malignant neoplasm of lower lobe, left bronchus or lung: Secondary | ICD-10-CM | POA: Diagnosis present

## 2020-12-03 DIAGNOSIS — C7889 Secondary malignant neoplasm of other digestive organs: Secondary | ICD-10-CM | POA: Insufficient documentation

## 2020-12-03 DIAGNOSIS — K7460 Unspecified cirrhosis of liver: Secondary | ICD-10-CM | POA: Diagnosis not present

## 2020-12-03 DIAGNOSIS — Z79899 Other long term (current) drug therapy: Secondary | ICD-10-CM | POA: Diagnosis not present

## 2020-12-03 DIAGNOSIS — Z5112 Encounter for antineoplastic immunotherapy: Secondary | ICD-10-CM | POA: Diagnosis present

## 2020-12-03 DIAGNOSIS — Z452 Encounter for adjustment and management of vascular access device: Secondary | ICD-10-CM | POA: Insufficient documentation

## 2020-12-03 DIAGNOSIS — D649 Anemia, unspecified: Secondary | ICD-10-CM | POA: Diagnosis not present

## 2020-12-03 LAB — CMP (CANCER CENTER ONLY)
ALT: 17 U/L (ref 0–44)
AST: 24 U/L (ref 15–41)
Albumin: 3 g/dL — ABNORMAL LOW (ref 3.5–5.0)
Alkaline Phosphatase: 118 U/L (ref 38–126)
Anion gap: 14 (ref 5–15)
BUN: <4 mg/dL — ABNORMAL LOW (ref 8–23)
CO2: 20 mmol/L — ABNORMAL LOW (ref 22–32)
Calcium: 9.1 mg/dL (ref 8.9–10.3)
Chloride: 99 mmol/L (ref 98–111)
Creatinine: 0.68 mg/dL (ref 0.61–1.24)
GFR, Estimated: >60 mL/min (ref >60)
Glucose, Bld: 111 mg/dL — ABNORMAL HIGH (ref 70–99)
Potassium: 3.1 mmol/L — ABNORMAL LOW (ref 3.5–5.1)
Sodium: 133 mmol/L — ABNORMAL LOW (ref 135–145)
Total Bilirubin: 0.4 mg/dL (ref 0.3–1.2)
Total Protein: 6.8 g/dL (ref 6.5–8.1)

## 2020-12-03 LAB — CBC WITH DIFFERENTIAL (CANCER CENTER ONLY)
Abs Immature Granulocytes: 0.16 10*3/uL — ABNORMAL HIGH (ref 0.00–0.07)
Basophils Absolute: 0.1 10*3/uL (ref 0.0–0.1)
Basophils Relative: 1 %
Eosinophils Absolute: 0.1 10*3/uL (ref 0.0–0.5)
Eosinophils Relative: 1 %
HCT: 37.3 % — ABNORMAL LOW (ref 39.0–52.0)
Hemoglobin: 13.5 g/dL (ref 13.0–17.0)
Immature Granulocytes: 2 %
Lymphocytes Relative: 14 %
Lymphs Abs: 1.3 10*3/uL (ref 0.7–4.0)
MCH: 34 pg (ref 26.0–34.0)
MCHC: 36.2 g/dL — ABNORMAL HIGH (ref 30.0–36.0)
MCV: 94 fL (ref 80.0–100.0)
Monocytes Absolute: 0.8 10*3/uL (ref 0.1–1.0)
Monocytes Relative: 9 %
Neutro Abs: 6.7 10*3/uL (ref 1.7–7.7)
Neutrophils Relative %: 73 %
Platelet Count: 222 10*3/uL (ref 150–400)
RBC: 3.97 MIL/uL — ABNORMAL LOW (ref 4.22–5.81)
RDW: 11.4 % — ABNORMAL LOW (ref 11.5–15.5)
WBC Count: 9.1 10*3/uL (ref 4.0–10.5)
nRBC: 0 % (ref 0.0–0.2)

## 2020-12-03 LAB — TSH: TSH: 2.164 u[IU]/mL (ref 0.320–4.118)

## 2020-12-03 MED ORDER — DIPHENHYDRAMINE HCL 50 MG/ML IJ SOLN
50.0000 mg | Freq: Once | INTRAMUSCULAR | Status: AC
  Administered 2020-12-03: 50 mg via INTRAVENOUS

## 2020-12-03 MED ORDER — FAMOTIDINE IN NACL 20-0.9 MG/50ML-% IV SOLN
20.0000 mg | Freq: Once | INTRAVENOUS | Status: AC
  Administered 2020-12-03: 20 mg via INTRAVENOUS

## 2020-12-03 MED ORDER — PALONOSETRON HCL INJECTION 0.25 MG/5ML
INTRAVENOUS | Status: AC
  Filled 2020-12-03: qty 5

## 2020-12-03 MED ORDER — PALONOSETRON HCL INJECTION 0.25 MG/5ML
0.2500 mg | Freq: Once | INTRAVENOUS | Status: AC
Start: 2020-12-03 — End: 2020-12-03
  Administered 2020-12-03: 0.25 mg via INTRAVENOUS

## 2020-12-03 MED ORDER — SODIUM CHLORIDE 0.9 % IV SOLN
150.0000 mg | Freq: Once | INTRAVENOUS | Status: AC
  Administered 2020-12-03: 150 mg via INTRAVENOUS
  Filled 2020-12-03: qty 150

## 2020-12-03 MED ORDER — DIPHENHYDRAMINE HCL 50 MG/ML IJ SOLN
INTRAMUSCULAR | Status: AC
  Filled 2020-12-03: qty 1

## 2020-12-03 MED ORDER — PEMBROLIZUMAB CHEMO INJECTION 100 MG/4ML
200.0000 mg | Freq: Once | INTRAVENOUS | Status: AC
  Administered 2020-12-03: 200 mg via INTRAVENOUS
  Filled 2020-12-03: qty 8

## 2020-12-03 MED ORDER — SODIUM CHLORIDE 0.9 % IV SOLN
392.0000 mg | Freq: Once | INTRAVENOUS | Status: AC
  Administered 2020-12-03: 390 mg via INTRAVENOUS
  Filled 2020-12-03: qty 39

## 2020-12-03 MED ORDER — SODIUM CHLORIDE 0.9 % IV SOLN
Freq: Once | INTRAVENOUS | Status: AC
  Filled 2020-12-03: qty 250

## 2020-12-03 MED ORDER — SODIUM CHLORIDE 0.9 % IV SOLN
175.0000 mg/m2 | Freq: Once | INTRAVENOUS | Status: AC
  Administered 2020-12-03: 312 mg via INTRAVENOUS
  Filled 2020-12-03: qty 52

## 2020-12-03 MED ORDER — FAMOTIDINE IN NACL 20-0.9 MG/50ML-% IV SOLN
INTRAVENOUS | Status: AC
  Filled 2020-12-03: qty 50

## 2020-12-03 MED ORDER — SODIUM CHLORIDE 0.9 % IV SOLN
10.0000 mg | Freq: Once | INTRAVENOUS | Status: AC
  Administered 2020-12-03: 10 mg via INTRAVENOUS
  Filled 2020-12-03: qty 10

## 2020-12-03 MED ORDER — POTASSIUM CHLORIDE CRYS ER 20 MEQ PO TBCR: 20.0000 meq | EXTENDED_RELEASE_TABLET | Freq: Every day | ORAL | 1 refills | Status: DC

## 2020-12-03 NOTE — Progress Notes (Signed)
[facility] OFFICE PROGRESS NOTE   Diagnosis: Non-small cell lung cancer  INTERVAL HISTORY:   Ronald [name] returns as scheduled.  He completed cycle 1 Taxol/carboplatin/Pembrolizumab 11/12/2020.  He developed neutropenia, ANC 0.0, on 11/22/2020.  He completed prophylactic Cipro.  Follow-up CBC on 11/25/2020 showed recovery of the [place] to 2.6.  He denies nausea/vomiting.  He developed a single ulcer at the right buccal mucosa.  He thinks this was related to an infection.  The ulcer has resolved.  No diarrhea or constipation.  No numbness or tingling in the hands or feet.  No rash.  He denies pain.  Cough and dyspnea at baseline.  No fever.  Objective:  Vital signs in last 24 hours:  Blood pressure 127/85, pulse 99, temperature 97.8 F (36.6 C), temperature source Tympanic, resp. rate 20, height '5\' 4"'  (1.626 m), weight 154 lb 11.2 oz (70.2 kg), SpO2 98 %.    HEENT: No thrush or ulcers. Resp: Distant breath sounds, scattered expiratory rhonchi.  No respiratory distress. Cardio: Regular rate and rhythm. GI: Abdomen soft and nontender.  No hepatomegaly. Vascular: No leg edema. Skin: No rash.   Lab Results:  Lab Results  Component Value Date   WBC 9.1 12/03/2020   HGB 13.5 12/03/2020   HCT 37.3 (L) 12/03/2020   MCV 94.0 12/03/2020   PLT 222 12/03/2020   NEUTROABS 6.7 12/03/2020    Imaging:  No results found.  Medications: I have reviewed the patient's current medications.  Assessment/Plan: 1.   Non-small cell lung cancer-squamous cell carcinoma  CT chest 09/19/2018 concerning for an obstructing left lower lobe mass, small left pleural effusion, 4 mm right lower lobe nodule  CT head 09/20/2018-no acute abnormality  Bronchoscopy 10/02/2018-complete occlusion of the left lower lobe with an obstructing mass, biopsies, washing, and brushing reveal squamous cell carcinoma, biopsies of level 7 and 4L nodes-negative  PET scan 10/08/2018-hypermetabolic left hilar mass  obstructing the left lower lobe bronchus with left lower lobe collapse, no evidence of metastatic lymphadenopathy or distant metastases  Clinical stage Ib (T2N0)  Radiation beginning 11/19/2018  Cycle 1 weekly Taxol/carboplatin 11/22/2018  Cycle 2 weekly Taxol/carboplatin 11/29/2018  Cycle 3 weekly Taxol/carboplatin2/04/2019  Cycle 4 weekly Taxol/carboplatin 12/13/2018  Cycle 5 weekly Taxol/carboplatin 12/20/2018  Cycle 6 weekly Taxol/carboplatin 12/27/2018  CT 02/24/2019-stable left lower lobe collapse, decreased left pleural effusion, abnormal soft tissue in the inferior left hilum  Durvalumab 03/28/2019 and 04/11/2019, patient then lost to follow-up  PET 09/30/2020-residual/recurrent disease of the left hilum with associated basilar collapse, slight increase in pleural fluid with small area of nodularity with equivocal uptake at the peripher of consolidated lung, hypermetabolic pancreas head mass, mild asymmetry at the left glossotonsillar region, cirrhosis  10/12/2020 bronchoscopy with cryotherapy, tumor debulking, lesional excision, and balloon dilatation-occluding tumor was noted in the left lower lobe with a small visible opening of the left upper lobe anterior segment, tumor occluded the lingula, no visible opening of the left lower lobe, the left mainstem bifurcation was not recognized due to necrotic tumor, biopsies were taken of level 7 nodes, tumor was debulked from the left upper lobe orifice and cryotherapy/dilation procedures were performed, the pathology revealed squamous cell carcinoma involving an endobronchial biopsy, the biopsy from the level 7 node revealed no malignant cells.  PD-L1 0%  EUS 10/28/2020-no esophageal varices, mild portal gastropathy, no gastric varices, 4.1 cm round mass in the pancreas head, a pancreatic and common bile duct were nondilated, no peripancreatic adenopathy, FNA biopsy-metastatic squamous cell carcinoma  Cycle 1 Taxol/carboplatin/Pembrolizumab  11/12/2020  Cycle 2 Taxol/carboplatin/Pembrolizumab 12/03/2020, carboplatin dose reduced, Fulphila 2.Tobacco and alcohol use 3.COPD 4.Weight loss 5.Anemia 6.History of tuberculosis 7.Changes of cirrhosis on the PET scan 10/08/2018 8.  Hypermetabolic pancreas head mass on PET scan 09/30/2020  Disposition: [name] appears stable.  He has completed 1 cycle of Taxol/carboplatin/pembrolizumab.  He tolerated well.  Plan to proceed with cycle 2 today as scheduled.  He will receive white cell growth factor support with this cycle due to neutropenia day 11.  We reviewed potential toxicities with white cell growth factor support including bone pain, rash, splenic rupture.  Carboplatin will be dose reduced.  He agrees to proceed.  He understands to contact the office with fever, chills, other signs of infection, bleeding.  He will return for a nadir CBC on 12/13/2020.  We reviewed the CBC from today.  Counts adequate to proceed as above with white cell growth factor support.  He will return for lab, follow-up, cycle 3 Taxol/carboplatin/pembrolizumab in 3 weeks.  He will contact the office in the interim as outlined above or with any other problems.  Plan reviewed with Dr. Benay Spice.      Ned Card ANP/GNP-BC   12/03/2020  9:48 AM

## 2020-12-03 NOTE — Patient Instructions (Signed)
[facility] Discharge Instructions for Patients Receiving Chemotherapy  Today you received the following chemotherapy agents:  Keytruda/Taxol/Carboplatin.  To help prevent nausea and vomiting after your treatment, we encourage you to take your nausea medication as directed.   If you develop nausea and vomiting that is not controlled by your nausea medication, call the clinic.   BELOW ARE SYMPTOMS THAT SHOULD BE REPORTED IMMEDIATELY:  *FEVER GREATER THAN 100.5 F  *CHILLS WITH OR WITHOUT FEVER  NAUSEA AND VOMITING THAT IS NOT CONTROLLED WITH YOUR NAUSEA MEDICATION  *UNUSUAL SHORTNESS OF BREATH  *UNUSUAL BRUISING OR BLEEDING  TENDERNESS IN MOUTH AND THROAT WITH OR WITHOUT PRESENCE OF ULCERS  *URINARY PROBLEMS  *BOWEL PROBLEMS  UNUSUAL RASH Items with * indicate a potential emergency and should be followed up as soon as possible.  Feel free to call the clinic should you have any questions or concerns. The clinic phone number is [phone].  Please show the [facility] at check-in to the Emergency Department and triage nurse.

## 2020-12-03 NOTE — Telephone Encounter (Signed)
Left VM for sister, that Ronald [name]'s K+ level is low and he needs to start taking oral K+ daily. Script sent to [facility]. Requested return call to confirm receipt.

## 2020-12-03 NOTE — Telephone Encounter (Signed)
Scheduled appointments per 2/4 los. Spoke to patient's sister who is aware of appointments dates and times. Gave her an updated calendar.

## 2020-12-06 ENCOUNTER — Ambulatory Visit: Payer: Medicare Other

## 2020-12-07 ENCOUNTER — Inpatient Hospital Stay: Payer: Medicare Other

## 2020-12-07 ENCOUNTER — Ambulatory Visit: Payer: Medicare Other

## 2020-12-07 ENCOUNTER — Other Ambulatory Visit: Payer: Self-pay

## 2020-12-07 VITALS — BP 133/80 | HR 116 | Temp 98.8°F | Resp 18

## 2020-12-07 DIAGNOSIS — Z5112 Encounter for antineoplastic immunotherapy: Secondary | ICD-10-CM | POA: Diagnosis not present

## 2020-12-07 DIAGNOSIS — C3432 Malignant neoplasm of lower lobe, left bronchus or lung: Secondary | ICD-10-CM

## 2020-12-07 MED ORDER — PEGFILGRASTIM-JMDB 6 MG/0.6ML ~~LOC~~ SOSY
6.0000 mg | PREFILLED_SYRINGE | Freq: Once | SUBCUTANEOUS | Status: AC
  Administered 2020-12-07: 6 mg via SUBCUTANEOUS

## 2020-12-07 MED ORDER — PEGFILGRASTIM-JMDB 6 MG/0.6ML ~~LOC~~ SOSY
PREFILLED_SYRINGE | SUBCUTANEOUS | Status: AC
  Filled 2020-12-07: qty 0.6

## 2020-12-07 NOTE — Patient Instructions (Signed)
Pegfilgrastim injection What is this medicine? PEGFILGRASTIM (PEG fil gra stim) is a long-acting granulocyte colony-stimulating factor that stimulates the growth of neutrophils, a type of white blood cell important in the body's fight against infection. It is used to reduce the incidence of fever and infection in patients with certain types of cancer who are receiving chemotherapy that affects the bone marrow, and to increase survival after being exposed to high doses of radiation. This medicine may be used for other purposes; ask your health care provider or pharmacist if you have questions. COMMON BRAND NAME(S): Fulphila, Neulasta, Nyvepria, UDENYCA, Ziextenzo What should I tell my health care provider before I take this medicine? They need to know if you have any of these conditions:  kidney disease  latex allergy  ongoing radiation therapy  sickle cell disease  skin reactions to acrylic adhesives (On-Body Injector only)  an unusual or allergic reaction to pegfilgrastim, filgrastim, other medicines, foods, dyes, or preservatives  pregnant or trying to get pregnant  breast-feeding How should I use this medicine? This medicine is for injection under the skin. If you get this medicine at home, you will be taught how to prepare and give the pre-filled syringe or how to use the On-body Injector. Refer to the patient Instructions for Use for detailed instructions. Use exactly as directed. Tell your healthcare provider immediately if you suspect that the On-body Injector may not have performed as intended or if you suspect the use of the On-body Injector resulted in a missed or partial dose. It is important that you put your used needles and syringes in a special sharps container. Do not put them in a trash can. If you do not have a sharps container, call your pharmacist or healthcare provider to get one. Talk to your pediatrician regarding the use of this medicine in children. While this drug  may be prescribed for selected conditions, precautions do apply. Overdosage: If you think you have taken too much of this medicine contact a poison control center or emergency room at once. NOTE: This medicine is only for you. Do not share this medicine with others. What if I miss a dose? It is important not to miss your dose. Call your doctor or health care professional if you miss your dose. If you miss a dose due to an On-body Injector failure or leakage, a new dose should be administered as soon as possible using a single prefilled syringe for manual use. What may interact with this medicine? Interactions have not been studied. This list may not describe all possible interactions. Give your health care provider a list of all the medicines, herbs, non-prescription drugs, or dietary supplements you use. Also tell them if you smoke, drink alcohol, or use illegal drugs. Some items may interact with your medicine. What should I watch for while using this medicine? Your condition will be monitored carefully while you are receiving this medicine. You may need blood work done while you are taking this medicine. Talk to your health care provider about your risk of cancer. You may be more at risk for certain types of cancer if you take this medicine. If you are going to need a MRI, CT scan, or other procedure, tell your doctor that you are using this medicine (On-Body Injector only). What side effects may I notice from receiving this medicine? Side effects that you should report to your doctor or health care professional as soon as possible:  allergic reactions (skin rash, itching or hives, swelling of   the face, lips, or tongue)  back pain  dizziness  fever  pain, redness, or irritation at site where injected  pinpoint red spots on the skin  red or dark-brown urine  shortness of breath or breathing problems  stomach or side pain, or pain at the shoulder  swelling  tiredness  trouble  passing urine or change in the amount of urine  unusual bruising or bleeding Side effects that usually do not require medical attention (report to your doctor or health care professional if they continue or are bothersome):  bone pain  muscle pain This list may not describe all possible side effects. Call your doctor for medical advice about side effects. You may report side effects to FDA at 1-800-FDA-1088. Where should I keep my medicine? Keep out of the reach of children. If you are using this medicine at home, you will be instructed on how to store it. Throw away any unused medicine after the expiration date on the label. NOTE: This sheet is a summary. It may not cover all possible information. If you have questions about this medicine, talk to your doctor, pharmacist, or health care provider.  2021 Elsevier/Gold Standard (2019-11-07 13:20:51)  

## 2020-12-14 ENCOUNTER — Other Ambulatory Visit: Payer: Self-pay

## 2020-12-14 ENCOUNTER — Inpatient Hospital Stay: Payer: Medicare Other

## 2020-12-14 DIAGNOSIS — C3432 Malignant neoplasm of lower lobe, left bronchus or lung: Secondary | ICD-10-CM

## 2020-12-14 DIAGNOSIS — Z5112 Encounter for antineoplastic immunotherapy: Secondary | ICD-10-CM | POA: Diagnosis not present

## 2020-12-14 LAB — CBC WITH DIFFERENTIAL (CANCER CENTER ONLY)
Abs Immature Granulocytes: 1.27 10*3/uL — ABNORMAL HIGH (ref 0.00–0.07)
Basophils Absolute: 0 10*3/uL (ref 0.0–0.1)
Basophils Relative: 0 %
Eosinophils Absolute: 0.1 10*3/uL (ref 0.0–0.5)
Eosinophils Relative: 0 %
HCT: 34.8 % — ABNORMAL LOW (ref 39.0–52.0)
Hemoglobin: 12.2 g/dL — ABNORMAL LOW (ref 13.0–17.0)
Immature Granulocytes: 5 %
Lymphocytes Relative: 5 %
Lymphs Abs: 1.4 10*3/uL (ref 0.7–4.0)
MCH: 33.5 pg (ref 26.0–34.0)
MCHC: 35.1 g/dL (ref 30.0–36.0)
MCV: 95.6 fL (ref 80.0–100.0)
Monocytes Absolute: 2.6 10*3/uL — ABNORMAL HIGH (ref 0.1–1.0)
Monocytes Relative: 10 %
Neutro Abs: 21.2 10*3/uL — ABNORMAL HIGH (ref 1.7–7.7)
Neutrophils Relative %: 80 %
Platelet Count: 98 10*3/uL — ABNORMAL LOW (ref 150–400)
RBC: 3.64 MIL/uL — ABNORMAL LOW (ref 4.22–5.81)
RDW: 11.6 % (ref 11.5–15.5)
WBC Count: 26.5 10*3/uL — ABNORMAL HIGH (ref 4.0–10.5)
nRBC: 0 % (ref 0.0–0.2)

## 2020-12-18 ENCOUNTER — Other Ambulatory Visit: Payer: Self-pay | Admitting: Oncology

## 2020-12-20 ENCOUNTER — Other Ambulatory Visit: Payer: Self-pay

## 2020-12-20 ENCOUNTER — Ambulatory Visit (INDEPENDENT_AMBULATORY_CARE_PROVIDER_SITE_OTHER): Payer: Medicare Other | Admitting: Pulmonary Disease

## 2020-12-20 ENCOUNTER — Encounter: Payer: Self-pay | Admitting: Pulmonary Disease

## 2020-12-20 VITALS — BP 112/56 | HR 118 | Temp 98.0°F | Ht 64.0 in | Wt 155.2 lb

## 2020-12-20 DIAGNOSIS — Z9889 Other specified postprocedural states: Secondary | ICD-10-CM | POA: Diagnosis not present

## 2020-12-20 DIAGNOSIS — Z87891 Personal history of nicotine dependence: Secondary | ICD-10-CM

## 2020-12-20 DIAGNOSIS — J449 Chronic obstructive pulmonary disease, unspecified: Secondary | ICD-10-CM | POA: Diagnosis not present

## 2020-12-20 DIAGNOSIS — C3492 Malignant neoplasm of unspecified part of left bronchus or lung: Secondary | ICD-10-CM

## 2020-12-20 DIAGNOSIS — R918 Other nonspecific abnormal finding of lung field: Secondary | ICD-10-CM

## 2020-12-20 DIAGNOSIS — E43 Unspecified severe protein-calorie malnutrition: Secondary | ICD-10-CM

## 2020-12-20 MED ORDER — TRELEGY ELLIPTA 100-62.5-25 MCG/INH IN AEPB: 1.0000 | INHALATION_SPRAY | Freq: Every day | RESPIRATORY_TRACT | 0 refills | Status: DC

## 2020-12-20 MED ORDER — ALBUTEROL SULFATE HFA 108 (90 BASE) MCG/ACT IN AERS: INHALATION_SPRAY | RESPIRATORY_TRACT | 5 refills | Status: AC

## 2020-12-20 MED ORDER — TRELEGY ELLIPTA 100-62.5-25 MCG/INH IN AEPB: 1.0000 | INHALATION_SPRAY | Freq: Every day | RESPIRATORY_TRACT | 3 refills | Status: AC

## 2020-12-20 NOTE — Progress Notes (Signed)
Synopsis: Referred in December 2019 for lower lobe lung mass by Dr. Halford Chessman.  Subjective:   PATIENT ID: Ronald [name] GENDER: male DOB: [birth year], MRN: [id number]  Chief Complaint  Patient presents with  . Follow-up    Productive cough with clear phlegm   Patient was recently admitted to the hospital and found to have an obstructing left lower lobe mass. The patient was taken for bronchoscopy on 10/02/2018 for evaluation of the left lower lobe endobronchial lung mass and mediastinal adenopathy.  Patient underwent under endobronchial ultrasound-guided transbronchial needle aspiration biopsies of station 4L and 7 as well as forcep biopsies of the left lower lobe endobronchial mass, cytology washing, and brushings of the left lower lobe.  Both lymph nodes station 7 and 4L were negative for malignancy and to the left lower lobe endobronchial mass samplings were consistent with squamous cell carcinoma.  The patient's hospitalization he has quit smoking.  Patient had PET scan completed this morning which revealed a T2 N0 M0 disease with no evidence of metastatic focus.  PFTs were completed which reveals severe COPD, FEV1 1.37 L, 49% predicted, DLCO 59% predicted.  Currently managed with albuterol as needed.  Baseline functional status he is self-sufficient able to care for himself at this time.  Over the past couple weeks has been sick and a little debilitated.  Overall doing much better since hospitalization.  Patient tolerated bronchoscopy well.  Patient denies hemoptysis.  OV 05/01/2019: Since his diagnosis of lung cancer patient has been followed by [facility] closely.  Has underwent chemotherapy for clinical stage Ib (T2N0) with radiation in January 2020 followed by 6 cycles of Taxol carboplatinum.  Started on durvalumab in May 2020. Today, patient has been doing well.  He did state that once he started immunotherapy he felt sick afterwards.  He is not sure that he wants to continue that.  I  told him to have that discussion with his oncologist.  As for his breathing he has been using his as needed albuterol.  He does state that [place] is affordable and does make his breathing better.  He would like refills of this today.  We are happy to do so.  We also gave him some samples to help reduced in his pharmacy cost.  Overall he only has significant dyspnea with exertion.  Otherwise is able to complete his activities of daily living.  He states that he has still quit smoking.  OV 09/15/2020: This is a 65 year old gentleman here for follow-up.  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  He had pulmonary function tests with a reduced FEV1.  He had chemotherapy and radiation.  Follows with Dr. Benay Spice.  Patient was last seen by medical oncology in June 2020.  He was started on immunotherapy.  He did not do well with the immunotherapy and felt like it made him feel bad.  At the time he was also drinking 3-5 beers per day.  He had a 0% PD-L1 expression.  He was started on Darvulumab.  He had 2 cycles of this.  And at that point per the patient he decided not to return to see the oncologist.  And subsequently has not had follow-up till seeing me in July 2020.  He is currently on Stiolto plus as needed albuterol for his COPD.  And has failed to follow-up for greater than a year and now returns today to discuss next steps.  OV 12/20/2020: Here today for follow-up after recent bronchoscopy and restaging of squamous cell carcinoma. Patient has reestablished care with medical oncology. Last seen by them on 12/03/2020 office note reviewed, Ned Card, NP. Patient was restarted on treatments with Taxol/carboplatinum/pembrolizumab. This was started on 11/12/2020. Initially when we first diagnosed his lung cancer in 2019 was treated as a T2N0 disease with radiation +6 cycles of Taxol plus carboplatinum. Overall doing well with initiation of his repeat chemotherapy.  Patient states he has completed 2 cycles at this point. He does have some shortness of breath. He was having trouble using his previous inhaler. This is mainly mechanical issue and had trouble making sure he was even using the Stiolto device correctly. He still uses his albuterol as needed for shortness of breath and wheezing.   Past Medical History:  Diagnosis Date  . Alcoholism ([place])   . Anemia    iron deficiency  . Cancer ([place])    lung Stage I- 10/11/20  . Collapse of left lung    lower lobe  . COPD (chronic obstructive pulmonary disease) ([place])   . Depression   . Dyspnea    Exertion  . Family history of adverse reaction to anesthesia    sister had a bad headache  . Hypertension    10/11/20 - not in years  . Mediastinal adenopathy     and endobronchial lesion  . Pneumonia   . TB (pulmonary tuberculosis)   . Wears dentures      Family History  Problem Relation Age of Onset  . Heart disease Mother   . Heart disease Father   . Diabetes Other   . Hypertension Other   . Ovarian cancer Maternal Grandmother      Past Surgical History:  Procedure Laterality Date  . ESOPHAGOGASTRODUODENOSCOPY (EGD) WITH PROPOFOL N/A 10/28/2020   Procedure: ESOPHAGOGASTRODUODENOSCOPY (EGD) WITH PROPOFOL;  Surgeon: Milus Banister, MD;  Location: [facility];  Service: Endoscopy;  Laterality: N/A;  . EUS N/A 10/28/2020   Procedure: UPPER ENDOSCOPIC ULTRASOUND (EUS) RADIAL;  Surgeon: Milus Banister, MD;  Location: [facility];  Service: Endoscopy;  Laterality: N/A;  . FINE NEEDLE ASPIRATION N/A 10/28/2020   Procedure: FINE NEEDLE ASPIRATION (FNA) LINEAR;  Surgeon: Milus Banister, MD;  Location: [facility];  Service: Endoscopy;  Laterality: N/A;  . MULTIPLE TOOTH EXTRACTIONS    . VIDEO BRONCHOSCOPY N/A 10/12/2020   Procedure: VIDEO BRONCHOSCOPY WITH CRYOTHERAPY AND BALLOON DILATATION;  Surgeon: Garner Nash, DO;  Location: [facility];  Service: Pulmonary;  Laterality: N/A;  . VIDEO  BRONCHOSCOPY WITH ENDOBRONCHIAL ULTRASOUND N/A 10/02/2018   Procedure: VIDEO BRONCHOSCOPY WITH ENDOBRONCHIAL ULTRASOUND;  Surgeon: Garner Nash, DO;  Location: [facility];  Service: Thoracic;  Laterality: N/A;  . VIDEO BRONCHOSCOPY WITH ENDOBRONCHIAL ULTRASOUND N/A 10/12/2020   Procedure: VIDEO BRONCHOSCOPY WITH ENDOBRONCHIAL ULTRASOUND;  Surgeon: Garner Nash, DO;  Location: [facility];  Service: Pulmonary;  Laterality: N/A;    Social History   Socioeconomic History  . Marital status: Legally Separated    Spouse name: Not on file  . Number of children: Not on file  . Years of education: Not on file  . Highest education level: Not on file  Occupational History  . Not on file  Tobacco Use  . Smoking status: Former Smoker    Packs/day: 2.00    Types: Cigarettes    Quit date: 09/19/2018    Years since quitting: 2.2  . Smokeless tobacco: Current User    Types: Snuff  Vaping Use  . Vaping Use: Never used  Substance and Sexual Activity  . Alcohol use: Yes    Alcohol/week: 4.0 standard drinks    Types: 4 Cans of beer per week  . Drug use: No  . Sexual activity: Never  Other Topics Concern  . Not on file  Social History Narrative  . Not on file   Social Determinants of Health   Financial Resource Strain: Not on file  Food Insecurity: Not on file  Transportation Needs: Not on file  Physical Activity: Not on file  Stress: Not on file  Social Connections: Not on file  Intimate Partner Violence: Not on file     No Known Allergies   Outpatient Medications Prior to Visit  Medication Sig Dispense Refill  . albuterol (PROVENTIL) (2.5 MG/3ML) 0.083% nebulizer solution TAKE 3ML BY MOUTH VIA NEBULIZER EVERY 4 HOURS AS NEEDED FOR WHEEZING OR SHORTNESS OF BREATH (Patient taking differently: Take 2.5 mg by nebulization every 4 (four) hours as needed (wheezing/shortness of breath.).) 75 mL 3  . albuterol (VENTOLIN HFA) 108 (90 Base) MCG/ACT inhaler INHALE 2 PUFFS BY MOUTH EVERY 6 HOURS AS  NEEDED FOR SHORTNESS OF BREATH (Patient taking differently: Inhale 2 puffs into the lungs every 6 (six) hours as needed for wheezing or shortness of breath.) 18 g 5  . ascorbic acid (VITAMIN C) 500 MG tablet Take 500 mg by mouth daily.    . cholecalciferol (VITAMIN D3) 25 MCG (1000 UNIT) tablet Take 1,000 Units by mouth daily.    . feeding supplement, ENSURE ENLIVE, (ENSURE ENLIVE) LIQD Take 237 mLs by mouth 2 (two) times daily between meals. (Patient taking differently: Take 237 mLs by mouth daily as needed (supplement).) 237 mL 12  . fexofenadine (ALLEGRA) 180 MG tablet Take 180 mg by mouth daily.    Marland Kitchen ibuprofen (ADVIL,MOTRIN) 200 MG tablet Take 400 mg by mouth daily as needed for headache or moderate pain.    Marland Kitchen ondansetron (ZOFRAN) 8 MG tablet Take 1 tablet (8 mg total) by mouth every 8 (eight) hours as needed for nausea or vomiting. 20 tablet 1  . potassium chloride SA (KLOR-CON) 20 MEQ tablet Take 1 tablet (20 mEq total) by mouth daily. 30 tablet 1  . prochlorperazine (COMPAZINE) 10 MG tablet Take 1 tablet (10 mg total) by mouth every 6 (six) hours as needed for nausea or vomiting. 30 tablet 1  . Tiotropium Bromide-Olodaterol (STIOLTO RESPIMAT) 2.5-2.5 MCG/ACT AERS Inhale 2 puffs into the lungs daily. 1 each 0   No facility-administered medications prior to visit.    Review of Systems  Constitutional: Negative for chills, fever, malaise/fatigue and weight loss.  HENT: Negative for hearing loss, sore throat and tinnitus.   Eyes: Negative for blurred vision and double vision.  Respiratory: Positive for cough and shortness of breath. Negative for hemoptysis, sputum production, wheezing and stridor.   Cardiovascular: Negative for chest pain, palpitations, orthopnea, leg swelling and PND.  Gastrointestinal: Negative for abdominal pain, constipation, diarrhea, heartburn, nausea and vomiting.  Genitourinary: Negative for dysuria, hematuria and urgency.  Musculoskeletal: Negative for joint pain  and myalgias.  Skin: Negative for itching and rash.  Neurological: Negative for dizziness, tingling, weakness and headaches.  Endo/Heme/Allergies: Negative for environmental allergies. Does not bruise/bleed easily.  Psychiatric/Behavioral: Negative for depression. The patient is not nervous/anxious and does not have insomnia.   All other systems reviewed and are negative.    Objective:   Physical Exam Constitutional:      Appearance: He is  normal weight.  HENT:     Mouth/Throat:     Mouth: Mucous membranes are moist.  Eyes:     Extraocular Movements: Extraocular movements intact.     Pupils: Pupils are equal, round, and reactive to light.  Cardiovascular:     Rate and Rhythm: Normal rate.     Pulses: Normal pulses.     Heart sounds: No murmur heard.   Pulmonary:     Effort: Pulmonary effort is normal. No respiratory distress.     Breath sounds: Normal breath sounds. No wheezing or rales.  Abdominal:     General: There is no distension.     Palpations: Abdomen is soft.  Musculoskeletal:        General: Normal range of motion.  Skin:    General: Skin is warm and dry.  Neurological:     General: No focal deficit present.     Mental Status: He is alert and oriented to person, place, and time.  Psychiatric:        Mood and Affect: Mood normal.        Judgment: Judgment normal.        Vitals:   12/20/20 1524  BP: (!) 112/56  Pulse: (!) 118  Temp: 98 F (36.7 C)  TempSrc: Temporal  SpO2: 96%  Weight: 155 lb 3.2 oz (70.4 kg)  Height: '5\' 4"'  (1.626 m)   96% on RA BMI Readings from Last 3 Encounters:  12/20/20 26.64 kg/m  12/03/20 26.55 kg/m  11/12/20 26.86 kg/m   Wt Readings from Last 3 Encounters:  12/20/20 155 lb 3.2 oz (70.4 kg)  12/03/20 154 lb 11.2 oz (70.2 kg)  11/12/20 156 lb 8 oz (71 kg)     CBC    Component Value Date/Time   WBC 26.5 (H) 12/14/2020 0726   WBC 8.4 10/12/2020 0629   RBC 3.64 (L) 12/14/2020 0726   HGB 12.2 (L) 12/14/2020  0726   HCT 34.8 (L) 12/14/2020 0726   PLT 98 (L) 12/14/2020 0726   MCV 95.6 12/14/2020 0726   MCH 33.5 12/14/2020 0726   MCHC 35.1 12/14/2020 0726   RDW 11.6 12/14/2020 0726   LYMPHSABS 1.4 12/14/2020 0726   MONOABS 2.6 (H) 12/14/2020 0726   EOSABS 0.1 12/14/2020 0726   BASOSABS 0.0 12/14/2020 0726    Chest Imaging: 10/08/2018 pet imaging No evidence of metastatic disease, T2 N0 M0 by pet imaging. The patient's images have been independently reviewed by me.    02/24/2019 CT chest: Persistent left lower lobe collapse, left infrahilar mass stable.  No significant progression of disease. The patient's images have been independently reviewed by me.    09/30/2020: NM PET  Left hilar disease PET avid where previous malignancy was identified. Lesion within the pancreas already had work-up. The patient's images have been independently reviewed by me.     Pulmonary Functions Testing Results: PFT Results Latest Ref Rng & Units 10/07/2018 09/23/2018  FVC-Pre L 2.52 -  FVC-Predicted Pre % 68 35  FVC-Post L 2.28 -  FVC-Predicted Post % 61 -  Pre FEV1/FVC % % 56 56  Post FEV1/FCV % % 60 -  FEV1-Pre L 1.41 0.74  FEV1-Predicted Pre % 50 26  FEV1-Post L 1.37 -  DLCO uncorrected ml/min/mmHg 14.53 -  DLCO UNC% % 59 -  DLVA Predicted % 93 -    FeNO: None   Pathology:    Bronchoscopy image: 100% occlusion of the LLL Tumor at the level of the left main subcarina  08/02/2018: Bronchoscopy pathology Cytology, washings, brushings and forcep biopsies of the left lower lobe endobronchial mass consistent with squamous cell carcinoma Station 4L and 7 negative for malignancy  Echocardiogram: None   Heart Catheterization: None     Assessment & Plan:      ICD-10-CM   1. Stage 3 severe COPD by GOLD classification ([place])  J44.9   2. S/P bronchoscopy  Z98.890   3. Squamous cell carcinoma lung, left (HCC)  C34.92   4. Hilar mass  R91.8   5. Protein-calorie malnutrition, severe  E43   6.  Former smoker  Z87.891     Discussion:  65 year old gentleman, T2, N0, M0 squamous cell carcinoma of the lung status post initially 6 cycles of chemo plus radiation in 2019/2020. Now with recurrence of disease status post bronchoscopy in December 2021. Currently undergoing repeat chemo treatments with medical oncology. Baseline has severe COPD stage III FEV1 49% predicted, DLCO 59% predicted. He is a former smoker.  Plan: He needs to be on a daily maintenance inhaler. We discussed this today in the office New start to Trelegy 100 I hope this is a simpler medication for him to use. Continue albuterol for shortness of breath and wheezing He tried and failed Stiolto. Needs continued followed up with medical oncology regarding cancer treatments.  Patient to return to see me in 6 months for COPD follow-up.    Current Outpatient Medications:  .  albuterol (PROVENTIL) (2.5 MG/3ML) 0.083% nebulizer solution, TAKE 3ML BY MOUTH VIA NEBULIZER EVERY 4 HOURS AS NEEDED FOR WHEEZING OR SHORTNESS OF BREATH (Patient taking differently: Take 2.5 mg by nebulization every 4 (four) hours as needed (wheezing/shortness of breath.).), Disp: 75 mL, Rfl: 3 .  albuterol (VENTOLIN HFA) 108 (90 Base) MCG/ACT inhaler, INHALE 2 PUFFS BY MOUTH EVERY 6 HOURS AS NEEDED FOR SHORTNESS OF BREATH (Patient taking differently: Inhale 2 puffs into the lungs every 6 (six) hours as needed for wheezing or shortness of breath.), Disp: 18 g, Rfl: 5 .  ascorbic acid (VITAMIN C) 500 MG tablet, Take 500 mg by mouth daily., Disp: , Rfl:  .  cholecalciferol (VITAMIN D3) 25 MCG (1000 UNIT) tablet, Take 1,000 Units by mouth daily., Disp: , Rfl:  .  feeding supplement, ENSURE ENLIVE, (ENSURE ENLIVE) LIQD, Take 237 mLs by mouth 2 (two) times daily between meals. (Patient taking differently: Take 237 mLs by mouth daily as needed (supplement).), Disp: 237 mL, Rfl: 12 .  fexofenadine (ALLEGRA) 180 MG tablet, Take 180 mg by mouth daily., Disp: ,  Rfl:  .  ibuprofen (ADVIL,MOTRIN) 200 MG tablet, Take 400 mg by mouth daily as needed for headache or moderate pain., Disp: , Rfl:  .  ondansetron (ZOFRAN) 8 MG tablet, Take 1 tablet (8 mg total) by mouth every 8 (eight) hours as needed for nausea or vomiting., Disp: 20 tablet, Rfl: 1 .  potassium chloride SA (KLOR-CON) 20 MEQ tablet, Take 1 tablet (20 mEq total) by mouth daily., Disp: 30 tablet, Rfl: 1 .  prochlorperazine (COMPAZINE) 10 MG tablet, Take 1 tablet (10 mg total) by mouth every 6 (six) hours as needed for nausea or vomiting., Disp: 30 tablet, Rfl: 1 .  Tiotropium Bromide-Olodaterol (STIOLTO RESPIMAT) 2.5-2.5 MCG/ACT AERS, Inhale 2 puffs into the lungs daily., Disp: 1 each, Rfl: 0  I spent 30 minutes dedicated to the care of this patient on the date of this encounter to include pre-visit review of records, face-to-face time with the patient discussing conditions above, post visit ordering of  testing, clinical documentation with the electronic health record, making appropriate referrals as documented, and communicating necessary findings to members of the patients care team.    Garner Nash, DO [facility] 12/20/2020 3:44 PM

## 2020-12-20 NOTE — Patient Instructions (Addendum)
Thank you for visiting Dr. Valeta Harms at [facility]. Today we recommend the following:  Trelegy 100 samples today and new prescription today   Return in about 3 months (around 03/19/2021) for with APP or Dr. Valeta Harms.    Please do your part to reduce the spread of COVID-19.

## 2020-12-24 ENCOUNTER — Inpatient Hospital Stay: Payer: Medicare Other

## 2020-12-24 ENCOUNTER — Inpatient Hospital Stay (HOSPITAL_BASED_OUTPATIENT_CLINIC_OR_DEPARTMENT_OTHER): Payer: Medicare Other | Admitting: Oncology

## 2020-12-24 ENCOUNTER — Other Ambulatory Visit: Payer: Self-pay

## 2020-12-24 VITALS — BP 112/68 | HR 92 | Resp 18

## 2020-12-24 VITALS — BP 109/92 | HR 120 | Temp 97.9°F | Resp 18 | Ht 64.0 in | Wt 152.2 lb

## 2020-12-24 DIAGNOSIS — C3432 Malignant neoplasm of lower lobe, left bronchus or lung: Secondary | ICD-10-CM

## 2020-12-24 DIAGNOSIS — Z5112 Encounter for antineoplastic immunotherapy: Secondary | ICD-10-CM | POA: Diagnosis not present

## 2020-12-24 DIAGNOSIS — R635 Abnormal weight gain: Secondary | ICD-10-CM

## 2020-12-24 LAB — CMP (CANCER CENTER ONLY)
ALT: 10 U/L (ref 0–44)
AST: 16 U/L (ref 15–41)
Albumin: 2.9 g/dL — ABNORMAL LOW (ref 3.5–5.0)
Alkaline Phosphatase: 125 U/L (ref 38–126)
Anion gap: 12 (ref 5–15)
BUN: 9 mg/dL (ref 8–23)
CO2: 21 mmol/L — ABNORMAL LOW (ref 22–32)
Calcium: 8.8 mg/dL — ABNORMAL LOW (ref 8.9–10.3)
Chloride: 103 mmol/L (ref 98–111)
Creatinine: 0.73 mg/dL (ref 0.61–1.24)
GFR, Estimated: >60 mL/min (ref >60)
Glucose, Bld: 122 mg/dL — ABNORMAL HIGH (ref 70–99)
Potassium: 3.6 mmol/L (ref 3.5–5.1)
Sodium: 136 mmol/L (ref 135–145)
Total Bilirubin: 0.4 mg/dL (ref 0.3–1.2)
Total Protein: 6.9 g/dL (ref 6.5–8.1)

## 2020-12-24 LAB — CBC WITH DIFFERENTIAL (CANCER CENTER ONLY)
Abs Immature Granulocytes: 0.09 10*3/uL — ABNORMAL HIGH (ref 0.00–0.07)
Basophils Absolute: 0.1 10*3/uL (ref 0.0–0.1)
Basophils Relative: 1 %
Eosinophils Absolute: 0 10*3/uL (ref 0.0–0.5)
Eosinophils Relative: 0 %
HCT: 33.4 % — ABNORMAL LOW (ref 39.0–52.0)
Hemoglobin: 11.6 g/dL — ABNORMAL LOW (ref 13.0–17.0)
Immature Granulocytes: 1 %
Lymphocytes Relative: 11 %
Lymphs Abs: 1.2 10*3/uL (ref 0.7–4.0)
MCH: 33.6 pg (ref 26.0–34.0)
MCHC: 34.7 g/dL (ref 30.0–36.0)
MCV: 96.8 fL (ref 80.0–100.0)
Monocytes Absolute: 1.2 10*3/uL — ABNORMAL HIGH (ref 0.1–1.0)
Monocytes Relative: 10 %
Neutro Abs: 8.6 10*3/uL — ABNORMAL HIGH (ref 1.7–7.7)
Neutrophils Relative %: 77 %
Platelet Count: 225 10*3/uL (ref 150–400)
RBC: 3.45 MIL/uL — ABNORMAL LOW (ref 4.22–5.81)
RDW: 13.2 % (ref 11.5–15.5)
WBC Count: 11.1 10*3/uL — ABNORMAL HIGH (ref 4.0–10.5)
nRBC: 0 % (ref 0.0–0.2)

## 2020-12-24 MED ORDER — DIPHENHYDRAMINE HCL 50 MG/ML IJ SOLN
INTRAMUSCULAR | Status: AC
  Filled 2020-12-24: qty 1

## 2020-12-24 MED ORDER — SODIUM CHLORIDE 0.9 % IV SOLN
10.0000 mg | Freq: Once | INTRAVENOUS | Status: AC
  Administered 2020-12-24: 10 mg via INTRAVENOUS
  Filled 2020-12-24: qty 10

## 2020-12-24 MED ORDER — SODIUM CHLORIDE 0.9 % IV SOLN
Freq: Once | INTRAVENOUS | Status: AC
  Filled 2020-12-24: qty 250

## 2020-12-24 MED ORDER — FAMOTIDINE IN NACL 20-0.9 MG/50ML-% IV SOLN
INTRAVENOUS | Status: AC
  Filled 2020-12-24: qty 50

## 2020-12-24 MED ORDER — SODIUM CHLORIDE 0.9 % IV SOLN
200.0000 mg | Freq: Once | INTRAVENOUS | Status: AC
  Administered 2020-12-24: 200 mg via INTRAVENOUS
  Filled 2020-12-24: qty 8

## 2020-12-24 MED ORDER — MIRTAZAPINE 7.5 MG PO TABS: 7.5000 mg | ORAL_TABLET | Freq: Every day | ORAL | 2 refills | Status: DC

## 2020-12-24 MED ORDER — SODIUM CHLORIDE 0.9 % IV SOLN
392.0000 mg | Freq: Once | INTRAVENOUS | Status: AC
  Administered 2020-12-24: 390 mg via INTRAVENOUS
  Filled 2020-12-24: qty 39

## 2020-12-24 MED ORDER — PALONOSETRON HCL INJECTION 0.25 MG/5ML
0.2500 mg | Freq: Once | INTRAVENOUS | Status: AC
  Administered 2020-12-24: 0.25 mg via INTRAVENOUS

## 2020-12-24 MED ORDER — SODIUM CHLORIDE 0.9 % IV SOLN
150.0000 mg | Freq: Once | INTRAVENOUS | Status: AC
  Administered 2020-12-24: 150 mg via INTRAVENOUS
  Filled 2020-12-24: qty 150

## 2020-12-24 MED ORDER — FAMOTIDINE IN NACL 20-0.9 MG/50ML-% IV SOLN
20.0000 mg | Freq: Once | INTRAVENOUS | Status: AC
  Administered 2020-12-24: 20 mg via INTRAVENOUS

## 2020-12-24 MED ORDER — DIPHENHYDRAMINE HCL 50 MG/ML IJ SOLN
50.0000 mg | Freq: Once | INTRAMUSCULAR | Status: AC
  Administered 2020-12-24: 50 mg via INTRAVENOUS

## 2020-12-24 MED ORDER — SODIUM CHLORIDE 0.9 % IV SOLN
175.0000 mg/m2 | Freq: Once | INTRAVENOUS | Status: AC
  Administered 2020-12-24: 312 mg via INTRAVENOUS
  Filled 2020-12-24: qty 52

## 2020-12-24 MED ORDER — PALONOSETRON HCL INJECTION 0.25 MG/5ML
INTRAVENOUS | Status: AC
  Filled 2020-12-24: qty 5

## 2020-12-24 NOTE — Patient Instructions (Signed)
[facility] Discharge Instructions for Patients Receiving Chemotherapy  Today you received the following chemotherapy agents: Pembrolizumab Beryle Flock), Paclitaxel (Taxol), and Carboplatin  To help prevent nausea and vomiting after your treatment, we encourage you to take your nausea medication  as prescribed.    If you develop nausea and vomiting that is not controlled by your nausea medication, call the clinic.   BELOW ARE SYMPTOMS THAT SHOULD BE REPORTED IMMEDIATELY:  *FEVER GREATER THAN 100.5 F  *CHILLS WITH OR WITHOUT FEVER  NAUSEA AND VOMITING THAT IS NOT CONTROLLED WITH YOUR NAUSEA MEDICATION  *UNUSUAL SHORTNESS OF BREATH  *UNUSUAL BRUISING OR BLEEDING  TENDERNESS IN MOUTH AND THROAT WITH OR WITHOUT PRESENCE OF ULCERS  *URINARY PROBLEMS  *BOWEL PROBLEMS  UNUSUAL RASH Items with * indicate a potential emergency and should be followed up as soon as possible.  Feel free to call the clinic should you have any questions or concerns. The clinic phone number is [phone].  Please show the [facility] at check-in to the Emergency Department and triage nurse.

## 2020-12-24 NOTE — Progress Notes (Signed)
[facility] OFFICE PROGRESS NOTE   Diagnosis: Non-small cell lung cancer  INTERVAL HISTORY:   Ronald [name] completed another cycle of Taxol/carboplatin/pembrolizumab on 12/03/2020.  No nausea/vomiting, rash, diarrhea, or neuropathy symptoms.  He has been depressed due to the death of his dog.  Objective:  Vital signs in last 24 hours:  Blood pressure (!) 109/92, pulse (!) 120, temperature 97.9 F (36.6 C), temperature source Tympanic, resp. rate 18, height '5\' 4"'  (1.626 m), weight 152 lb 3.2 oz (69 kg), SpO2 100 %.    HEENT: No thrush or ulcers Resp: Distant breath sounds, bilateral inspiratory/expiratory wheeze, no respiratory distress Cardio: Regular rate and rhythm, tachycardia GI: No hepatomegaly Vascular: No leg edema  Skin: No rash    Lab Results:  Lab Results  Component Value Date   WBC 11.1 (H) 12/24/2020   HGB 11.6 (L) 12/24/2020   HCT 33.4 (L) 12/24/2020   MCV 96.8 12/24/2020   PLT 225 12/24/2020   NEUTROABS 8.6 (H) 12/24/2020    CMP  Lab Results  Component Value Date   NA 133 (L) 12/03/2020   K 3.1 (L) 12/03/2020   CL 99 12/03/2020   CO2 20 (L) 12/03/2020   GLUCOSE 111 (H) 12/03/2020   BUN <4 (L) 12/03/2020   CREATININE 0.68 12/03/2020   CALCIUM 9.1 12/03/2020   PROT 6.8 12/03/2020   ALBUMIN 3.0 (L) 12/03/2020   AST 24 12/03/2020   ALT 17 12/03/2020   ALKPHOS 118 12/03/2020   BILITOT 0.4 12/03/2020   GFRNONAA >60 12/03/2020   GFRAA >60 04/11/2019    Medications: I have reviewed the patient's current medications.   Assessment/Plan:   1. Non-small cell lung cancer-squamous cell carcinoma  CT chest 09/19/2018 concerning for an obstructing left lower lobe mass, small left pleural effusion, 4 mm right lower lobe nodule  CT head 09/20/2018-no acute abnormality  Bronchoscopy 10/02/2018-complete occlusion of the left lower lobe with an obstructing mass, biopsies, washing, and brushing reveal squamous cell carcinoma, biopsies of level  7 and 4L nodes-negative  PET scan 10/08/2018-hypermetabolic left hilar mass obstructing the left lower lobe bronchus with left lower lobe collapse, no evidence of metastatic lymphadenopathy or distant metastases  Clinical stage Ib (T2N0)  Radiation beginning 11/19/2018  Cycle 1 weekly Taxol/carboplatin 11/22/2018  Cycle 2 weekly Taxol/carboplatin 11/29/2018  Cycle 3 weekly Taxol/carboplatin2/04/2019  Cycle 4 weekly Taxol/carboplatin 12/13/2018  Cycle 5 weekly Taxol/carboplatin 12/20/2018  Cycle 6 weekly Taxol/carboplatin 12/27/2018  CT 02/24/2019-stable left lower lobe collapse, decreased left pleural effusion, abnormal soft tissue in the inferior left hilum  Durvalumab 03/28/2019 and 04/11/2019, patient then lost to follow-up  PET 09/30/2020-residual/recurrent disease of the left hilum with associated basilar collapse, slight increase in pleural fluid with small area of nodularity with equivocal uptake at the peripher of consolidated lung, hypermetabolic pancreas head mass, mild asymmetry at the left glossotonsillar region, cirrhosis  10/12/2020 bronchoscopy with cryotherapy, tumor debulking, lesional excision, and balloon dilatation-occluding tumor was noted in the left lower lobe with a small visible opening of the left upper lobe anterior segment, tumor occluded the lingula, no visible opening of the left lower lobe, the left mainstem bifurcation was not recognized due to necrotic tumor, biopsies were taken of level 7 nodes, tumor was debulked from the left upper lobe orifice and cryotherapy/dilation procedures were performed, the pathology revealed squamous cell carcinoma involving an endobronchial biopsy, the biopsy from the level 7 node revealed no malignant cells.  PD-L1 0%  EUS 10/28/2020-no esophageal varices, mild portal gastropathy, no  gastric varices, 4.1 cm round mass in the pancreas head, a pancreatic and common bile duct were nondilated, no peripancreatic adenopathy, FNA  biopsy-metastatic squamous cell carcinoma  Cycle 1 Taxol/carboplatin/Pembrolizumab 11/12/2020  Cycle 2 Taxol/carboplatin/Pembrolizumab 12/03/2020, carboplatin dose reduced, Fulphila  Cycle 3 Taxol/carboplatin/pembrolizumab 12/24/2020, Fulphila 2.Tobacco and alcohol use 3.COPD 4.Weight loss 5.Anemia 6.History of tuberculosis 7.Changes of cirrhosis on the PET scan 10/08/2018 8.  Hypermetabolic pancreas head mass on PET scan 09/30/2020    Disposition: [name] appears stable.  He has completed 2 treatments with Taxol/carboplatin/pembrolizumab.  He is tolerating treatment well.  He will complete cycle 3 today.  He would like a trial of an antidepressant.  He will begin Remeron.  Mr. Pulse will return for an office visit and cycle 4 chemotherapy in 3 weeks.  He will undergo restaging CTs after cycle 4.  Betsy Coder, MD  12/24/2020  8:05 AM

## 2020-12-24 NOTE — Progress Notes (Signed)
Per Dr. Benay Spice: OK to treat w/pulse 116.

## 2020-12-27 ENCOUNTER — Telehealth: Payer: Self-pay | Admitting: Oncology

## 2020-12-27 NOTE — Telephone Encounter (Signed)
Scheduled appointments per 2/25 los. Called patient, no answer. Left message with appointments date and times.

## 2021-01-03 ENCOUNTER — Telehealth: Payer: Self-pay

## 2021-01-03 NOTE — Telephone Encounter (Signed)
Received call from patient's sister regarding new onset syncope with loss of bowel control, black stools, and BIL lower extremity weakness. Spoke with Sandi Mealy, PA, and advised caller to take patient to the ED. Verbalized understanding.

## 2021-01-04 ENCOUNTER — Telehealth: Payer: Self-pay | Admitting: *Deleted

## 2021-01-04 NOTE — Telephone Encounter (Signed)
Called sister, [name] to f/u on why patient did not go to ER as instructed. She reports he refused to go. Had to call EMS to home last night due to temp 102.5, tachycardia at 123/min. Had syncope episode and very weak . Has significant cough as well. Black stools and was incontinent as well. Strongly encouraged her to get him to hospital--could have covid, pneumonia, GI bleed and all of these can take his life if he does not get medical attention. Most likely will need admission to deal with his issues. She will try again to get him to allow them to take him. He may be more receptive to [facility]. She will keep [place] posted.

## 2021-01-05 ENCOUNTER — Encounter (HOSPITAL_COMMUNITY): Payer: Self-pay | Admitting: Internal Medicine

## 2021-01-05 ENCOUNTER — Emergency Department (HOSPITAL_COMMUNITY): Payer: Medicare Other

## 2021-01-05 ENCOUNTER — Inpatient Hospital Stay (HOSPITAL_COMMUNITY): Payer: Medicare Other

## 2021-01-05 ENCOUNTER — Other Ambulatory Visit: Payer: Self-pay

## 2021-01-05 ENCOUNTER — Inpatient Hospital Stay (HOSPITAL_COMMUNITY)
Admission: EM | Admit: 2021-01-05 | Discharge: 2021-01-07 | DRG: 378 | Disposition: A | Discharge status: Home / Self Care | Payer: Medicare Other | Attending: Internal Medicine | Admitting: Internal Medicine

## 2021-01-05 DIAGNOSIS — Z8611 Personal history of tuberculosis: Secondary | ICD-10-CM

## 2021-01-05 DIAGNOSIS — F1020 Alcohol dependence, uncomplicated: Secondary | ICD-10-CM | POA: Diagnosis present

## 2021-01-05 DIAGNOSIS — R627 Adult failure to thrive: Secondary | ICD-10-CM | POA: Diagnosis present

## 2021-01-05 DIAGNOSIS — R55 Syncope and collapse: Secondary | ICD-10-CM

## 2021-01-05 DIAGNOSIS — Z8249 Family history of ischemic heart disease and other diseases of the circulatory system: Secondary | ICD-10-CM

## 2021-01-05 DIAGNOSIS — K3189 Other diseases of stomach and duodenum: Secondary | ICD-10-CM | POA: Diagnosis present

## 2021-01-05 DIAGNOSIS — Z20822 Contact with and (suspected) exposure to covid-19: Secondary | ICD-10-CM | POA: Diagnosis present

## 2021-01-05 DIAGNOSIS — E871 Hypo-osmolality and hyponatremia: Secondary | ICD-10-CM | POA: Diagnosis present

## 2021-01-05 DIAGNOSIS — I10 Essential (primary) hypertension: Secondary | ICD-10-CM | POA: Diagnosis present

## 2021-01-05 DIAGNOSIS — K921 Melena: Principal | ICD-10-CM

## 2021-01-05 DIAGNOSIS — Z79899 Other long term (current) drug therapy: Secondary | ICD-10-CM | POA: Diagnosis not present

## 2021-01-05 DIAGNOSIS — K7030 Alcoholic cirrhosis of liver without ascites: Secondary | ICD-10-CM | POA: Diagnosis present

## 2021-01-05 DIAGNOSIS — Z6824 Body mass index (BMI) 24.0-24.9, adult: Secondary | ICD-10-CM | POA: Diagnosis not present

## 2021-01-05 DIAGNOSIS — K922 Gastrointestinal hemorrhage, unspecified: Secondary | ICD-10-CM | POA: Diagnosis not present

## 2021-01-05 DIAGNOSIS — C7889 Secondary malignant neoplasm of other digestive organs: Secondary | ICD-10-CM | POA: Diagnosis present

## 2021-01-05 DIAGNOSIS — Z66 Do not resuscitate: Secondary | ICD-10-CM | POA: Diagnosis present

## 2021-01-05 DIAGNOSIS — Z8041 Family history of malignant neoplasm of ovary: Secondary | ICD-10-CM | POA: Diagnosis not present

## 2021-01-05 DIAGNOSIS — Z85118 Personal history of other malignant neoplasm of bronchus and lung: Secondary | ICD-10-CM | POA: Diagnosis not present

## 2021-01-05 DIAGNOSIS — Z7951 Long term (current) use of inhaled steroids: Secondary | ICD-10-CM | POA: Diagnosis not present

## 2021-01-05 DIAGNOSIS — Z87891 Personal history of nicotine dependence: Secondary | ICD-10-CM | POA: Diagnosis not present

## 2021-01-05 DIAGNOSIS — R000 Tachycardia, unspecified: Secondary | ICD-10-CM

## 2021-01-05 DIAGNOSIS — C3490 Malignant neoplasm of unspecified part of unspecified bronchus or lung: Secondary | ICD-10-CM

## 2021-01-05 DIAGNOSIS — D649 Anemia, unspecified: Secondary | ICD-10-CM | POA: Diagnosis present

## 2021-01-05 DIAGNOSIS — E876 Hypokalemia: Secondary | ICD-10-CM | POA: Diagnosis present

## 2021-01-05 DIAGNOSIS — C3432 Malignant neoplasm of lower lobe, left bronchus or lung: Secondary | ICD-10-CM | POA: Diagnosis not present

## 2021-01-05 DIAGNOSIS — J449 Chronic obstructive pulmonary disease, unspecified: Secondary | ICD-10-CM | POA: Diagnosis present

## 2021-01-05 LAB — COMPREHENSIVE METABOLIC PANEL
ALT: 25 U/L (ref 0–44)
AST: 72 U/L — ABNORMAL HIGH (ref 15–41)
Albumin: 2.7 g/dL — ABNORMAL LOW (ref 3.5–5.0)
Alkaline Phosphatase: 87 U/L (ref 38–126)
Anion gap: 15 (ref 5–15)
BUN: 9 mg/dL (ref 8–23)
CO2: 22 mmol/L (ref 22–32)
Calcium: 8.3 mg/dL — ABNORMAL LOW (ref 8.9–10.3)
Chloride: 85 mmol/L — ABNORMAL LOW (ref 98–111)
Creatinine, Ser: 0.65 mg/dL (ref 0.61–1.24)
GFR, Estimated: >60 mL/min (ref >60)
Glucose, Bld: 125 mg/dL — ABNORMAL HIGH (ref 70–99)
Potassium: 3.3 mmol/L — ABNORMAL LOW (ref 3.5–5.1)
Sodium: 122 mmol/L — ABNORMAL LOW (ref 135–145)
Total Bilirubin: 1.4 mg/dL — ABNORMAL HIGH (ref 0.3–1.2)
Total Protein: 6.2 g/dL — ABNORMAL LOW (ref 6.5–8.1)

## 2021-01-05 LAB — MAGNESIUM: Magnesium: 1.5 mg/dL — ABNORMAL LOW (ref 1.7–2.4)

## 2021-01-05 LAB — CBC WITH DIFFERENTIAL/PLATELET
Abs Immature Granulocytes: 0.05 10*3/uL (ref 0.00–0.07)
Basophils Absolute: 0 10*3/uL (ref 0.0–0.1)
Basophils Relative: 1 %
Eosinophils Absolute: 0.1 10*3/uL (ref 0.0–0.5)
Eosinophils Relative: 1 %
HCT: 25.9 % — ABNORMAL LOW (ref 39.0–52.0)
Hemoglobin: 9.3 g/dL — ABNORMAL LOW (ref 13.0–17.0)
Immature Granulocytes: 1 %
Lymphocytes Relative: 9 %
Lymphs Abs: 0.5 10*3/uL — ABNORMAL LOW (ref 0.7–4.0)
MCH: 34.2 pg — ABNORMAL HIGH (ref 26.0–34.0)
MCHC: 35.9 g/dL (ref 30.0–36.0)
MCV: 95.2 fL (ref 80.0–100.0)
Monocytes Absolute: 1.1 10*3/uL — ABNORMAL HIGH (ref 0.1–1.0)
Monocytes Relative: 22 %
Neutro Abs: 3.3 10*3/uL (ref 1.7–7.7)
Neutrophils Relative %: 66 %
Platelets: 144 10*3/uL — ABNORMAL LOW (ref 150–400)
RBC: 2.72 MIL/uL — ABNORMAL LOW (ref 4.22–5.81)
RDW: 14.6 % (ref 11.5–15.5)
WBC: 5 10*3/uL (ref 4.0–10.5)
nRBC: 0 % (ref 0.0–0.2)

## 2021-01-05 LAB — LACTIC ACID, PLASMA
Lactic Acid, Venous: 1.5 mmol/L (ref 0.5–1.9)
Lactic Acid, Venous: 1.4 mmol/L (ref 0.5–1.9)

## 2021-01-05 LAB — LIPASE, BLOOD: Lipase: 111 U/L — ABNORMAL HIGH (ref 11–51)

## 2021-01-05 LAB — ETHANOL: Alcohol, Ethyl (B): <10 mg/dL (ref <10)

## 2021-01-05 LAB — HIV ANTIBODY (ROUTINE TESTING W REFLEX): HIV Screen 4th Generation wRfx: NONREACTIVE

## 2021-01-05 LAB — PROTIME-INR
INR: 1.3 — ABNORMAL HIGH (ref 0.8–1.2)
Prothrombin Time: 15.5 seconds — ABNORMAL HIGH (ref 11.4–15.2)

## 2021-01-05 LAB — RESP PANEL BY RT-PCR (FLU A&B, COVID) ARPGX2
Influenza A by PCR: NEGATIVE
Influenza B by PCR: NEGATIVE
SARS Coronavirus 2 by RT PCR: NEGATIVE

## 2021-01-05 LAB — PREPARE RBC (CROSSMATCH)

## 2021-01-05 LAB — HEMOGLOBIN AND HEMATOCRIT, BLOOD
HCT: 21.7 % — ABNORMAL LOW (ref 39.0–52.0)
HCT: 22.6 % — ABNORMAL LOW (ref 39.0–52.0)
Hemoglobin: 7.5 g/dL — ABNORMAL LOW (ref 13.0–17.0)
Hemoglobin: 7.8 g/dL — ABNORMAL LOW (ref 13.0–17.0)

## 2021-01-05 LAB — PHOSPHORUS: Phosphorus: 2.8 mg/dL (ref 2.5–4.6)

## 2021-01-05 LAB — APTT: aPTT: 28 seconds (ref 24–36)

## 2021-01-05 MED ORDER — UMECLIDINIUM BROMIDE 62.5 MCG/INH IN AEPB
1.0000 | INHALATION_SPRAY | Freq: Every day | RESPIRATORY_TRACT | Status: DC
  Administered 2021-01-05 – 2021-01-07 (×3): 1 via RESPIRATORY_TRACT
  Filled 2021-01-05: qty 7

## 2021-01-05 MED ORDER — SODIUM CHLORIDE 0.9 % IV BOLUS
500.0000 mL | Freq: Once | INTRAVENOUS | Status: AC
  Administered 2021-01-05: 500 mL via INTRAVENOUS

## 2021-01-05 MED ORDER — THIAMINE HCL 100 MG PO TABS
100.0000 mg | ORAL_TABLET | Freq: Every day | ORAL | Status: DC
  Administered 2021-01-07: 100 mg via ORAL
  Filled 2021-01-05: qty 1

## 2021-01-05 MED ORDER — ALBUTEROL SULFATE (2.5 MG/3ML) 0.083% IN NEBU: 2.5000 mg | INHALATION_SOLUTION | RESPIRATORY_TRACT | Status: DC | PRN

## 2021-01-05 MED ORDER — FOLIC ACID 1 MG PO TABS
1.0000 mg | ORAL_TABLET | Freq: Every day | ORAL | Status: DC
  Administered 2021-01-07: 1 mg via ORAL
  Filled 2021-01-05: qty 1

## 2021-01-05 MED ORDER — IOHEXOL 300 MG/ML  SOLN
100.0000 mL | Freq: Once | INTRAMUSCULAR | Status: AC | PRN
  Administered 2021-01-05: 75 mL via INTRAVENOUS

## 2021-01-05 MED ORDER — SODIUM CHLORIDE 0.9 % IV SOLN: INTRAVENOUS | Status: AC

## 2021-01-05 MED ORDER — PANTOPRAZOLE SODIUM 40 MG IV SOLR
40.0000 mg | Freq: Two times a day (BID) | INTRAVENOUS | Status: DC
  Administered 2021-01-05 – 2021-01-06 (×3): 40 mg via INTRAVENOUS
  Filled 2021-01-05 (×3): qty 40

## 2021-01-05 MED ORDER — SODIUM CHLORIDE 0.9% IV SOLUTION: Freq: Once | INTRAVENOUS | Status: AC

## 2021-01-05 MED ORDER — ADULT MULTIVITAMIN W/MINERALS CH
1.0000 | ORAL_TABLET | Freq: Every day | ORAL | Status: DC
  Administered 2021-01-07: 1 via ORAL
  Filled 2021-01-05: qty 1

## 2021-01-05 MED ORDER — MORPHINE SULFATE (PF) 2 MG/ML IV SOLN: 2.0000 mg | INTRAVENOUS | Status: DC | PRN

## 2021-01-05 MED ORDER — PANTOPRAZOLE SODIUM 40 MG IV SOLR
40.0000 mg | Freq: Once | INTRAVENOUS | Status: AC
  Administered 2021-01-05: 40 mg via INTRAVENOUS
  Filled 2021-01-05: qty 40

## 2021-01-05 MED ORDER — ONDANSETRON HCL 4 MG/2ML IJ SOLN: 4.0000 mg | Freq: Four times a day (QID) | INTRAMUSCULAR | Status: DC | PRN

## 2021-01-05 MED ORDER — SODIUM CHLORIDE 0.9 % IV SOLN
1.0000 g | INTRAVENOUS | Status: DC
  Administered 2021-01-05 – 2021-01-06 (×2): 1 g via INTRAVENOUS
  Filled 2021-01-05 (×3): qty 10

## 2021-01-05 MED ORDER — IOHEXOL 350 MG/ML SOLN: 100.0000 mL | Freq: Once | INTRAVENOUS | Status: DC | PRN

## 2021-01-05 MED ORDER — LORAZEPAM 1 MG PO TABS
1.0000 mg | ORAL_TABLET | ORAL | Status: DC | PRN
Start: 2021-01-05 — End: 2021-01-07

## 2021-01-05 MED ORDER — ONDANSETRON HCL 4 MG PO TABS: 4.0000 mg | ORAL_TABLET | Freq: Four times a day (QID) | ORAL | Status: DC | PRN

## 2021-01-05 MED ORDER — SODIUM CHLORIDE 0.9% FLUSH
3.0000 mL | Freq: Two times a day (BID) | INTRAVENOUS | Status: DC
  Administered 2021-01-05 – 2021-01-06 (×4): 3 mL via INTRAVENOUS

## 2021-01-05 MED ORDER — LORAZEPAM 2 MG/ML IJ SOLN: 1.0000 mg | INTRAMUSCULAR | Status: DC | PRN

## 2021-01-05 MED ORDER — THIAMINE HCL 100 MG/ML IJ SOLN
100.0000 mg | Freq: Every day | INTRAMUSCULAR | Status: DC
  Administered 2021-01-06: 100 mg via INTRAVENOUS
  Filled 2021-01-05: qty 2

## 2021-01-05 MED ORDER — FLUTICASONE-UMECLIDIN-VILANT 100-62.5-25 MCG/INH IN AEPB: 1.0000 | INHALATION_SPRAY | Freq: Every day | RESPIRATORY_TRACT | Status: DC

## 2021-01-05 MED ORDER — FLUTICASONE FUROATE-VILANTEROL 100-25 MCG/INH IN AEPB
1.0000 | INHALATION_SPRAY | Freq: Every day | RESPIRATORY_TRACT | Status: DC
  Administered 2021-01-05 – 2021-01-07 (×3): 1 via RESPIRATORY_TRACT
  Filled 2021-01-05: qty 28

## 2021-01-05 NOTE — ED Provider Notes (Signed)
[facility] DEPT Provider Note   CSN: [id number] Arrival date & time: 01/05/21  0818     History Chief Complaint  Patient presents with   Abdominal Pain    Ronald [name] is a 65 y.o. male presenting for evaluation of weakness, syncope, decreased appetite, and black stool.  Patient states for the past 4 days, he has not been feeling well.  He reports worsening weakness and dizziness.  When he stands up and because he has passed out, this is happened twice.  He did hit his head once.  He also noticed black stools and diarrhea.  No nausea, vomiting, or abdominal pain.  He had a fever yesterday, but has not had any fevers today.  He denies chest pain.  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  He is not currently on a blood thinner.  He has been taking ibuprofen as needed, only 1 dose in the past several days.  Decreased urination, none since yesterday.  Additional history obtained from chart review.  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   HPI     Past Medical History:  Diagnosis Date   Alcoholism ([place])    Anemia    iron deficiency   Cancer ([place])    lung Stage I- 10/11/20   Collapse of left lung    lower lobe   COPD (chronic obstructive pulmonary disease) ([facility])    Depression    Dyspnea    Exertion   Family history of adverse reaction to anesthesia    sister had a bad headache   Hypertension    10/11/20 - not in years   Mediastinal adenopathy     and endobronchial lesion   Pneumonia    TB (pulmonary tuberculosis)    Wears dentures     Patient Active Problem List   Diagnosis Date Noted   Goals of care, counseling/discussion 11/03/2020   S/P bronchoscopy 10/14/2020   Endobronchial cancer, left ([place])    Squamous cell carcinoma lung,  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   Lung cancer ([place]) 11/05/2018   Mediastinal adenopathy    Thrush, oral 09/27/2018   Protein-calorie malnutrition, severe 09/23/2018   Abnormal ECG 09/20/2018   CAD (coronary artery disease) 09/20/2018   Obstructive pneumonia 09/19/2018   Abnormal LFTs 09/19/2018   Hyponatremia 09/19/2018   Stage 3 severe COPD by GOLD classification ([place]) 09/19/2018   Hypertension 09/19/2018   Alcoholism ([place]) 09/19/2018   Anemia 09/19/2018   Lung mass 09/19/2018    Past Surgical History:  Procedure Laterality Date   ESOPHAGOGASTRODUODENOSCOPY (EGD) WITH PROPOFOL N/A 10/28/2020   Procedure: ESOPHAGOGASTRODUODENOSCOPY (EGD) WITH PROPOFOL;  Surgeon: Milus Banister, MD;  Location: [facility];  Service: Endoscopy;  Laterality: N/A;   EUS N/A 10/28/2020   Procedure: UPPER ENDOSCOPIC ULTRASOUND (EUS) RADIAL;  Surgeon: Milus Banister, MD;  Location: [facility];  Service: Endoscopy;  Laterality: N/A;   FINE NEEDLE ASPIRATION N/A 10/28/2020   Procedure: FINE NEEDLE ASPIRATION (FNA) LINEAR;  Surgeon: Milus Banister, MD;  Location: [facility];  Service: Endoscopy;  Laterality: N/A;   MULTIPLE TOOTH EXTRACTIONS     VIDEO BRONCHOSCOPY N/A 10/12/2020   Procedure: VIDEO BRONCHOSCOPY WITH CRYOTHERAPY AND BALLOON DILATATION;  Surgeon: Garner Nash, DO;  Location: [facility];  Service: Pulmonary;  Laterality: N/A;   VIDEO BRONCHOSCOPY  WITH ENDOBRONCHIAL ULTRASOUND N/A 10/02/2018   Procedure: VIDEO BRONCHOSCOPY WITH ENDOBRONCHIAL ULTRASOUND;  Surgeon: Garner Nash, DO;  Location: [facility];  Service: Thoracic;  Laterality: N/A;   VIDEO BRONCHOSCOPY WITH ENDOBRONCHIAL ULTRASOUND N/A 10/12/2020   Procedure: VIDEO BRONCHOSCOPY WITH ENDOBRONCHIAL ULTRASOUND;  Surgeon: Garner Nash, DO;  Location: [facility];  Service: Pulmonary;  Laterality: N/A;       Family History  Problem Relation Age of Onset   Heart disease Mother    Heart disease Father    Diabetes Other     Hypertension Other    Ovarian cancer Maternal Grandmother     Social History   Tobacco Use   Smoking status: Former Smoker    Packs/day: 2.00    Types: Cigarettes    Quit date: 09/19/2018    Years since quitting: 2.2   Smokeless tobacco: Current User    Types: Snuff  Vaping Use   Vaping Use: Never used  Substance Use Topics   Alcohol use: Yes    Alcohol/week: 4.0 standard drinks    Types: 4 Cans of beer per week   Drug use: No    Home Medications Prior to Admission medications   Medication Sig Start Date End Date Taking? Authorizing Provider  albuterol (PROVENTIL) (2.5 MG/3ML) 0.083% nebulizer solution TAKE 3ML BY MOUTH VIA NEBULIZER EVERY 4 HOURS AS NEEDED FOR WHEEZING OR SHORTNESS OF BREATH Patient taking differently: Take 2.5 mg by nebulization every 4 (four) hours as needed (wheezing/shortness of breath.). 09/15/20   Icard, Octavio Graves, DO  albuterol (VENTOLIN HFA) 108 (90 Base) MCG/ACT inhaler INHALE 2 PUFFS BY MOUTH EVERY 6 HOURS AS NEEDED FOR SHORTNESS OF BREATH 12/20/20   Icard, Octavio Graves, DO  ascorbic acid (VITAMIN C) 500 MG tablet Take 500 mg by mouth daily.    [provider]  cholecalciferol (VITAMIN D3) 25 MCG (1000 UNIT) tablet Take 1,000 Units by mouth daily.    [provider]  feeding supplement, ENSURE ENLIVE, (ENSURE ENLIVE) LIQD Take 237 mLs by mouth 2 (two) times daily between meals. Patient taking differently: Take 237 mLs by mouth daily as needed (supplement). 09/24/18   Florencia Reasons, MD  fexofenadine (ALLEGRA) 180 MG tablet Take 180 mg by mouth daily.    [provider]  Fluticasone-Umeclidin-Vilant (TRELEGY ELLIPTA) 100-62.5-25 MCG/INH AEPB Inhale 1 puff into the lungs daily. Patient not taking: Reported on 12/24/2020 12/20/20   Garner Nash, DO  Fluticasone-Umeclidin-Vilant (TRELEGY ELLIPTA) 100-62.5-25 MCG/INH AEPB Inhale 1 puff into the lungs daily. 12/20/20   Icard, Octavio Graves, DO  ibuprofen (ADVIL,MOTRIN) 200 MG tablet  Take 400 mg by mouth daily as needed for headache or moderate pain. Patient not taking: Reported on 12/24/2020    [provider]  mirtazapine (REMERON) 7.5 MG tablet Take 1 tablet (7.5 mg total) by mouth at bedtime. 12/24/20   Ladell Pier, MD  ondansetron (ZOFRAN) 8 MG tablet Take 1 tablet (8 mg total) by mouth every 8 (eight) hours as needed for nausea or vomiting. Patient not taking: Reported on 12/24/2020 11/12/20   Ladell Pier, MD  potassium chloride SA (KLOR-CON) 20 MEQ tablet Take 1 tablet (20 mEq total) by mouth daily. 12/03/20   Owens Shark, NP  prochlorperazine (COMPAZINE) 10 MG tablet Take 1 tablet (10 mg total) by mouth every 6 (six) hours as needed for nausea or vomiting. Patient not taking: Reported on 12/24/2020 11/12/20   Ladell Pier, MD    Allergies    Patient has  no known allergies.  Review of Systems   Review of Systems  Constitutional: Positive for appetite change.  Respiratory: Positive for cough and shortness of breath.   Gastrointestinal: Positive for blood in stool and diarrhea.  Genitourinary: Positive for decreased urine volume.  Allergic/Immunologic: Positive for immunocompromised state.  Neurological: Positive for dizziness, syncope and weakness.  All other systems reviewed and are negative.   Physical Exam Updated Vital Signs BP 111/75 (BP Location: Left Arm)    Pulse (!) 117    Temp 98.5 F (36.9 C) (Oral)    Resp 20    Ht 5\' 4"  (1.626 m)    Wt 65.8 kg    SpO2 100%    BMI 24.89 kg/m   Physical Exam Vitals and nursing note reviewed. Exam conducted with a chaperone present.  Constitutional:      Appearance: He is well-developed and well-nourished. He is ill-appearing.     Comments: Appears chronically ill  HENT:     Head: Normocephalic and atraumatic.     Mouth/Throat:     Mouth: Mucous membranes are dry.     Comments: MM dry Eyes:     Extraocular Movements: Extraocular movements intact and EOM normal.     Conjunctiva/sclera:  Conjunctivae normal.     Pupils: Pupils are equal, round, and reactive to light.  Cardiovascular:     Rate and Rhythm: Regular rhythm. Tachycardia present.     Pulses: Normal pulses and intact distal pulses.  Pulmonary:     Effort: Pulmonary effort is normal. No respiratory distress.     Breath sounds: Rhonchi present. No wheezing.     Comments: Rales in LLL. Speaking in full sentences. SpO2 stable on RA Abdominal:     General: There is distension.     Palpations: Abdomen is soft. There is no mass.     Tenderness: There is no abdominal tenderness. There is no guarding or rebound.     Comments: Mild distention, but no rigidity or guarding. No ttp. No peritonitis or rebound  Genitourinary:    Rectum: Guaiac result positive.     Comments: Melena noted on exam Musculoskeletal:        General: Normal range of motion.     Cervical back: Normal range of motion and neck supple.     Left lower leg: No edema.  Skin:    General: Skin is warm and dry.     Capillary Refill: Capillary refill takes less than 2 seconds.  Neurological:     Mental Status: He is alert and oriented to person, place, and time.  Psychiatric:        Mood and Affect: Mood and affect normal.     ED Results / Procedures / Treatments   Labs (all labs ordered are listed, but only abnormal results are displayed) Labs Reviewed - No data to display  EKG None  Radiology No results found.  Procedures .Critical Care Performed by: Franchot Heidelberg, PA-C Authorized by: Franchot Heidelberg, PA-C   Critical care provider statement:    Critical care time (minutes):  45   Critical care time was exclusive of:  Separately billable procedures and treating other patients and teaching time   Critical care was necessary to treat or prevent imminent or life-threatening deterioration of the following conditions:  Circulatory failure   Critical care was time spent personally by me on the following activities:  Blood draw for  specimens, discussions with consultants, development of treatment plan with patient or surrogate, evaluation of patient's  response to treatment, examination of patient, obtaining history from patient or surrogate, ordering and review of laboratory studies, ordering and performing treatments and interventions, ordering and review of radiographic studies, pulse oximetry, re-evaluation of patient's condition and review of old charts   I assumed direction of critical care for this patient from another provider in my specialty: no     Care discussed with: admitting provider   Comments:     Pt is anemic with gross melena. Admitted to the hospital      Medications Ordered in ED Medications - No data to display  ED Course  I have reviewed the triage vital signs and the nursing notes.  Pertinent labs & imaging results that were available during my care of the patient were reviewed by me and considered in my medical decision making (see chart for details).    MDM Rules/Calculators/A&P                          Patient presenting for evaluation of weakness, shortness of breath, dizziness/syncope, and black stools.  On exam, patient appears chronically ill.  He is tachycardic, and appears dehydrated.  He is however no signs of left lower lobe.  Guaiac positive with gross melena on exam.  In the setting of fever yesterday, consider sepsis.  Also consider GI bleed/anemia.  Consider electrolyte abnormality.  Consider worsening cancer.  Consider PE.  Will check CT head, chest x-ray, labs, EKG.  Labs show anemia, initially at 9.  However this was drawn with other specimens were hemolyzed, will repeat.  Patient has hyponatremia, will give fluids.  Additionally, lipase is elevated.  Will consult with GI and plan for admission to hospitalist.  Discussed with Dr. Neysa Bonito from [facility], pt to be admitted.   Discussed with P Guenter, PA-C, who will evaluate the pt.   Final Clinical Impression(s) /  ED Diagnoses Final diagnoses:  Melena  Anemia, unspecified type  Hyponatremia  Tachycardia  Primary malignant neoplasm of lung metastatic to other site, unspecified laterality [facility])    Rx / DC Orders ED Discharge Orders    None       Franchot Heidelberg, PA-C 01/05/21 1319    Blanchie Dessert, MD 01/05/21 2108

## 2021-01-05 NOTE — Consult Note (Addendum)
Referring Provider:  [facility]         Primary Care Physician:  [facility] [place] [facility]: Oretha Caprice, MD                 We were asked to see this patient for:  GI bleed        Attending physician's note   I have taken a history, examined the patient and reviewed the chart. I agree with the Advanced Practitioner's note, impression and recommendations.  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 All questions answered. The patient agrees to proceed.    The patient was provided an opportunity to ask questions and all were answered. The patient agreed with the plan and demonstrated an understanding of the instructions.  Damaris Hippo , MD [phone]     ASSESSMENT / PLAN:    # 65 yo male with squamous cell lung cancer with metastatic disease ( including pancreas) undergoing chemotherapy with Dr. Learta Codding  # GI bleed with black stool and decline in hgb from 11.6 to 9.3 in ED. Repeat hgb is down to 7.5 but that is after IV fluids. Rule out PUD, AVM. Rule out portal gastropathy given history of probable cirrhosis. Variceal bleed unlikely.   Right colonic bleed also possible.  --Needs EGD. Will plan to do it tomorrow if hyponatremia improves. The risks and benefits of EGD were discussed and the patient agrees to proceed.  --Continue BID PPI --Trend H+H --Rocephin for SBP prophylaxis  # Abdominal distention with tympany so could have ileus.  Doubt obstruction in absence of nausea / vomiting. Additionally could have a small amount of ascites.  --Ordering a CT scan w/ contrast.   # Probable cirrhosis based on PET scan in December. If he has cirrhosis it doesn't appear to be associated with portal HTN.  He does have thrombocytopenia but that could be related to chemotherapy. No esophageal or gastric varices seen at time of EUS late December 2021 while evaluating pancreatic mass.  --INR 1.3   # Hyponatremia, Na+ 122  # Weight loss, down 7 pounds since late February. Albumin 2.7.     HPI:                                                                                                                             Chief Complaint: GI bleed  Ronald [name] is a 65 y.o. male with history of COPD, lung cancer diagnosed in 2019.  He  started chemotherapy but then lost to follow up at some point in 2020. PET in December 2021 showed residual / recurrent disease , cirrhotic liver and hypermetabolic pancreas head mass. He underwent cryotherapy, tumor debulking and balloon dilation of an occluding tumor. Biopsies at that time c/w squamous cell carcinoma. EUS by Dr. Ardis Hughs  Negative for esophageal or gastric varices, 4.1 cm pancreatic head mass, no PD or CBD dilation, no peripancreatic adenopathy. FNA + for metastatic squamous cell carcinoma. He just completed cycle 3 of taxol/carboplatin/pembrolizumab  Patient presented to ED today with black stool and syncope. Black stool started on Saturday,  4 days ago. His sister call Oncologist on 3/7 to report the black stools and fever and ED evaluation was advised but patient declined. Patient says he isn't eating much and so not having many bowel movements but the ones he does have are black. No iron or bismuth use.  ED notes mention abdominal pain but patient tells me he hasn't really had any abdominal pain just gas.  No nausea or vomiting.  Denies NSAID use.   In ED patient was tachycardic, normotensive.   Presenting hgb 9.3, down from 11.6 on 2/25. INR 1.3, Na+ 122. BUN 9. Lipase 111, mild elevation in AST. Albumin 2.7.. Black stool on EDP exam.     PREVIOUS ENDOSCOPIC EVALUATIONS / PERTINENT STUDIES   Dec 2021 EUS - Dr. Ardis Hughs 4.1cm mass in the head of the pancreas that is not causing main pancreatic duct or biliary duct obstruction. Preliminary cytology review shows atypical appearing squamoid cells. Await final pathology testing however this is more likely to be a metastatic site from his known squamous lung cancer than a primary pancreatic tumor. Portal gastropathy changes noted. No esophageal or gastric varices.  FINAL MICROSCOPIC DIAGNOSIS:  - Malignant cells consistent with metastatic squamous cell carcinoma    Past Medical History:  Diagnosis Date  . Alcoholism ([place])   . Anemia    iron deficiency  . Cancer ([place])    lung Stage I- 10/11/20  . Collapse of left lung    lower lobe  . COPD (chronic obstructive pulmonary disease) ([place])   . Depression   . Dyspnea    Exertion  . Family history of adverse reaction to anesthesia    sister had a bad headache  . Hypertension    10/11/20 - not in years  . Mediastinal adenopathy     and endobronchial lesion  . Pneumonia   . TB (pulmonary tuberculosis)   . Wears dentures     Past Surgical History:  Procedure Laterality Date  . ESOPHAGOGASTRODUODENOSCOPY (EGD) WITH PROPOFOL N/A 10/28/2020   Procedure: ESOPHAGOGASTRODUODENOSCOPY (EGD) WITH PROPOFOL;  Surgeon: Milus Banister, MD;  Location: [facility];  Service: Endoscopy;  Laterality: N/A;  . EUS N/A 10/28/2020   Procedure: UPPER ENDOSCOPIC ULTRASOUND (EUS) RADIAL;  Surgeon: Milus Banister, MD;  Location: [facility];  Service: Endoscopy;  Laterality: N/A;  . FINE NEEDLE ASPIRATION N/A 10/28/2020   Procedure: FINE NEEDLE ASPIRATION (FNA) LINEAR;  Surgeon: Milus Banister, MD;  Location: [facility];  Service: Endoscopy;  Laterality: N/A;  . MULTIPLE TOOTH EXTRACTIONS    .  VIDEO BRONCHOSCOPY N/A 10/12/2020   Procedure: VIDEO BRONCHOSCOPY WITH CRYOTHERAPY AND BALLOON DILATATION;  Surgeon: Garner Nash, DO;  Location: [place];  Service: Pulmonary;  Laterality: N/A;  . VIDEO BRONCHOSCOPY WITH ENDOBRONCHIAL ULTRASOUND N/A 10/02/2018   Procedure: VIDEO BRONCHOSCOPY WITH ENDOBRONCHIAL ULTRASOUND;  Surgeon: Garner Nash, DO;  Location: [place];  Service: Thoracic;  Laterality: N/A;  . VIDEO BRONCHOSCOPY WITH ENDOBRONCHIAL ULTRASOUND N/A 10/12/2020   Procedure: VIDEO BRONCHOSCOPY WITH ENDOBRONCHIAL ULTRASOUND;  Surgeon: Garner Nash, DO;  Location: [place];  Service: Pulmonary;  Laterality: N/A;    Prior to Admission medications   Medication Sig Start Date End Date Taking? Authorizing Provider  albuterol (PROVENTIL) (2.5 MG/3ML) 0.083% nebulizer solution TAKE 3ML BY MOUTH VIA NEBULIZER EVERY 4 HOURS AS NEEDED FOR WHEEZING OR SHORTNESS OF BREATH Patient taking differently: Take 2.5 mg by nebulization every 4 (four) hours as needed (wheezing/shortness of breath.). 09/15/20  Yes Icard, Bradley L, DO  albuterol (VENTOLIN HFA) 108 (90 Base) MCG/ACT inhaler INHALE 2 PUFFS BY MOUTH EVERY 6 HOURS AS NEEDED FOR SHORTNESS OF BREATH Patient taking differently: Inhale 2 puffs into the lungs every 6 (six) hours as needed for shortness of breath. 12/20/20  Yes Icard, Bradley L, DO  ascorbic acid (VITAMIN C) 500 MG tablet Take 500 mg by mouth daily.   Yes [provider]  cholecalciferol (VITAMIN D3) 25 MCG (1000 UNIT) tablet Take 1,000 Units by mouth daily.   Yes [provider]  feeding supplement, ENSURE ENLIVE, (ENSURE ENLIVE) LIQD Take 237 mLs by mouth 2 (two) times daily between meals. Patient taking differently: Take 237 mLs by mouth daily as needed (supplement). 09/24/18  Yes Florencia Reasons, MD  fexofenadine (ALLEGRA) 180 MG tablet Take 180 mg by mouth daily.   Yes [provider]  Fluticasone-Umeclidin-Vilant (TRELEGY ELLIPTA) 100-62.5-25 MCG/INH AEPB  Inhale 1 puff into the lungs daily. 12/20/20  Yes Icard, Bradley L, DO  mirtazapine (REMERON) 7.5 MG tablet Take 1 tablet (7.5 mg total) by mouth at bedtime. 12/24/20  Yes Ladell Pier, MD  potassium chloride SA (KLOR-CON) 20 MEQ tablet Take 1 tablet (20 mEq total) by mouth daily. 12/03/20  Yes Owens Shark, NP    Current Facility-Administered Medications  Medication Dose Route Frequency Provider Last Rate Last Admin  . 0.9 %  sodium chloride infusion   Intravenous Continuous Harold Hedge, MD      . albuterol (PROVENTIL) (2.5 MG/3ML) 0.083% nebulizer solution 2.5 mg  2.5 mg Nebulization Q4H PRN Harold Hedge, MD      . Fluticasone-Umeclidin-Vilant 100-62.5-25 MCG/INH AEPB 1 puff  1 puff Inhalation Daily Harold Hedge, MD      . morphine 2 MG/ML injection 2 mg  2 mg Intravenous Q2H PRN Harold Hedge, MD      . ondansetron [facility]) tablet 4 mg  4 mg Oral Q6H PRN Harold Hedge, MD       Or  . ondansetron [facility]) injection 4 mg  4 mg Intravenous Q6H PRN Harold Hedge, MD      . pantoprazole (PROTONIX) injection 40 mg  40 mg Intravenous Q12H Marva Panda E, MD      . sodium chloride flush (NS) 0.9 % injection 3 mL  3 mL Intravenous Q12H Harold Hedge, MD       Current Outpatient Medications  Medication Sig Dispense Refill  . albuterol (PROVENTIL) (2.5 MG/3ML) 0.083% nebulizer solution TAKE 3ML BY MOUTH VIA NEBULIZER EVERY 4 HOURS AS NEEDED FOR WHEEZING OR SHORTNESS OF BREATH (Patient taking differently: Take 2.5 mg by nebulization every 4 (four) hours as needed (wheezing/shortness of breath.).) 75 mL 3  . albuterol (VENTOLIN HFA) 108 (90 Base) MCG/ACT inhaler INHALE 2 PUFFS BY MOUTH EVERY 6 HOURS AS NEEDED FOR SHORTNESS OF BREATH (Patient taking differently: Inhale 2 puffs into the  lungs every 6 (six) hours as needed for shortness of breath.) 18 g 5  . ascorbic acid (VITAMIN C) 500 MG tablet Take 500 mg by mouth daily.    . cholecalciferol (VITAMIN D3) 25 MCG (1000 UNIT) tablet Take 1,000  Units by mouth daily.    . feeding supplement, ENSURE ENLIVE, (ENSURE ENLIVE) LIQD Take 237 mLs by mouth 2 (two) times daily between meals. (Patient taking differently: Take 237 mLs by mouth daily as needed (supplement).) 237 mL 12  . fexofenadine (ALLEGRA) 180 MG tablet Take 180 mg by mouth daily.    . Fluticasone-Umeclidin-Vilant (TRELEGY ELLIPTA) 100-62.5-25 MCG/INH AEPB Inhale 1 puff into the lungs daily. 60 each 3  . mirtazapine (REMERON) 7.5 MG tablet Take 1 tablet (7.5 mg total) by mouth at bedtime. 30 tablet 2  . potassium chloride SA (KLOR-CON) 20 MEQ tablet Take 1 tablet (20 mEq total) by mouth daily. 30 tablet 1    Allergies as of 01/05/2021  . (No Known Allergies)    Family History  Problem Relation Age of Onset  . Heart disease Mother   . Heart disease Father   . Diabetes Other   . Hypertension Other   . Ovarian cancer Maternal Grandmother     Social History   Socioeconomic History  . Marital status: Legally Separated    Spouse name: Not on file  . Number of children: Not on file  . Years of education: Not on file  . Highest education level: Not on file  Occupational History  . Not on file  Tobacco Use  . Smoking status: Former Smoker    Packs/day: 2.00    Types: Cigarettes    Quit date: 09/19/2018    Years since quitting: 2.2  . Smokeless tobacco: Current User    Types: Snuff  Vaping Use  . Vaping Use: Never used  Substance and Sexual Activity  . Alcohol use: Yes    Alcohol/week: 4.0 standard drinks    Types: 4 Cans of beer per week  . Drug use: No  . Sexual activity: Never  Other Topics Concern  . Not on file  Social History Narrative  . Not on file   Social Determinants of Health   Financial Resource Strain: Not on file  Food Insecurity: Not on file  Transportation Needs: Not on file  Physical Activity: Not on file  Stress: Not on file  Social Connections: Not on file  Intimate Partner Violence: Not on file    Review of Systems: Positive  for weight loss and weakness. All other systems reviewed and negative except where noted in HPI.    OBJECTIVE:    Physical Exam: Vital signs in last 24 hours: Temp:  [98.5 F (36.9 C)] 98.5 F (36.9 C) (03/09 0839) Pulse Rate:  [45-117] 102 (03/09 1145) Resp:  [20-26] 22 (03/09 1030) BP: (108-114)/(70-79) 113/70 (03/09 1030) SpO2:  [96 %-100 %] 96 % (03/09 1145) Weight:  [65.8 kg] 65.8 kg (03/09 0347)   General:   Alert  male in NAD Psych:  Pleasant, cooperative. Normal mood and affect. Eyes:  Pupils equal, sclera clear, no icterus.   Conjunctiva pink. Ears:  Normal auditory acuity. Nose:  No deformity, discharge,  or lesions. Neck:  Supple; no masses Lungs:  Decreased breath sounds in LLL.   Heart:  Regular rate and rhythm;  no lower extremity edema Abdomen:  Soft, moderately distended with tympany, nontender, abnormal BS   Rectal:  Deferred  Msk:  Symmetrical without gross deformities. Marland Kitchen  Neurologic:  Alert and  oriented x4;  grossly normal neurologically. Skin:  Intact without significant lesions or rashes.  Filed Weights   01/05/21 0838  Weight: 65.8 kg     Scheduled inpatient medications . Fluticasone-Umeclidin-Vilant  1 puff Inhalation Daily  . pantoprazole (PROTONIX) IV  40 mg Intravenous Q12H  . sodium chloride flush  3 mL Intravenous Q12H      Intake/Output from previous day: No intake/output data recorded. Intake/Output this shift: No intake/output data recorded.   Lab Results: Recent Labs    01/05/21 0916  WBC 5.0  HGB 9.3*  HCT 25.9*  PLT 144*   BMET Recent Labs    01/05/21 0950  NA 122*  K 3.3*  CL 85*  CO2 22  GLUCOSE 125*  BUN 9  CREATININE 0.65  CALCIUM 8.3*   LFT Recent Labs    01/05/21 0950  PROT 6.2*  ALBUMIN 2.7*  AST 72*  ALT 25  ALKPHOS 87  BILITOT 1.4*   PT/INR Recent Labs    01/05/21 0916  LABPROT 15.5*  INR 1.3*   Hepatitis Panel No results for input(s): HEPBSAG, HCVAB, HEPAIGM, HEPBIGM in the last 72  hours.   . CBC Latest Ref Rng & Units 01/05/2021 12/24/2020 12/14/2020  WBC 4.0 - 10.5 K/uL 5.0 11.1(H) 26.5(H)  Hemoglobin 13.0 - 17.0 g/dL 9.3(L) 11.6(L) 12.2(L)  Hematocrit 39.0 - 52.0 % 25.9(L) 33.4(L) 34.8(L)  Platelets 150 - 400 K/uL 144(L) 225 98(L)    . CMP Latest Ref Rng & Units 01/05/2021 12/24/2020 12/03/2020  Glucose 70 - 99 mg/dL 125(H) 122(H) 111(H)  BUN 8 - 23 mg/dL 9 9 <4(L)  Creatinine 0.61 - 1.24 mg/dL 0.65 0.73 0.68  Sodium 135 - 145 mmol/L 122(L) 136 133(L)  Potassium 3.5 - 5.1 mmol/L 3.3(L) 3.6 3.1(L)  Chloride 98 - 111 mmol/L 85(L) 103 99  CO2 22 - 32 mmol/L 22 21(L) 20(L)  Calcium 8.9 - 10.3 mg/dL 8.3(L) 8.8(L) 9.1  Total Protein 6.5 - 8.1 g/dL 6.2(L) 6.9 6.8  Total Bilirubin 0.3 - 1.2 mg/dL 1.4(H) 0.4 0.4  Alkaline Phos 38 - 126 U/L 87 125 118  AST 15 - 41 U/L 72(H) 16 24  ALT 0 - 44 U/L 25 10 17    Studies/Results: DG Chest 2 View  Result Date: 01/05/2021 CLINICAL DATA:  Shortness of breath. EXAM: CHEST - 2 VIEW COMPARISON:  PET CT 09/30/2020.  Chest x-ray 01/10/2019. FINDINGS: Mediastinum is stable. Persistent left hilar/perihilar density is again noted. Persistent atelectasis left lung base. Small left pleural effusion again noted. No pneumothorax. No acute bony abnormality identified. IMPRESSION: Persistent left hilar/perihilar density is again noted. Persistent atelectasis left lung base. Small left pleural effusion again noted. Similar findings noted on PET-CT. Reference is made to PET-CT of 09/30/2020. No new findings present. Electronically Signed   By: Marcello Moores  Register   On: 01/05/2021 10:17   CT Head Wo Contrast  Result Date: 01/05/2021 CLINICAL DATA:  Head trauma.  Abdominal pain.  Abnormal stool. EXAM: CT HEAD WITHOUT CONTRAST TECHNIQUE: Contiguous axial images were obtained from the base of the skull through the vertex without intravenous contrast. COMPARISON:  CT head without contrast 09/20/2018 FINDINGS: Brain: Moderate generalized atrophy is present.  Minimal white matter changes are within normal limits for age. Atrophy is disc proportionate in the frontal lobes, similar the prior exam. No acute infarct, hemorrhage, or mass lesion is present. The basal ganglia are within normal limits. No acute or focal cortical abnormality is present. The brainstem and  cerebellum are within normal limits. Vascular: No hyperdense vessel or unexpected calcification. Skull: No significant extracranial soft tissue lesion is present. Calvarium is intact. No focal lytic or blastic lesions are present. Sinuses/Orbits: The paranasal sinuses and mastoid air cells are clear. The globes and orbits are within normal limits. IMPRESSION: 1. No acute intracranial abnormality or significant interval change. 2. Moderate generalized atrophy is proportionate in the frontal lobes, similar to the prior exam. This likely reflects the sequela of chronic microvascular ischemia. Electronically Signed   By: San Morelle M.D.   On: 01/05/2021 10:31    Active Problems:   Failure to thrive in adult   GI bleed    Tye Savoy, NP-C @  01/05/2021, 12:12 PM

## 2021-01-05 NOTE — H&P (Signed)
History and Physical        Hospital Admission Note Date: 01/05/2021  Patient name: Ronald [name] Medical record number: [id number] Date of birth: May 13, 1956 Age: 65 y.o. Gender: male  PCP: [facility]    Chief Complaint    Chief Complaint  Patient presents with  . 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 intake, falls and syncope.  Also reportedly had a fever yesterday but not today.  On 3/7 the patient's sister called his oncology office with new onset syncope and loss of bowel control, black stools and bilateral lower extremity weakness and he was advised to go to the ED however the patient had refused and so did not go.  EMS was called to the home on 3/7 due to 102.5 F fever, tachycardia 123 bpm with the syncopal episode.  Also with a cough and black stools.  This prompted him to come to the ED today.  Patient states that he had syncopal episodes after his bowel movements when he went to stand up.  He sustained a scalp laceration.  Currently without any chest pain, abdominal pain or any other complaints.   ED Course: Afebrile, tachycardic, tachypneic,  hemodynamically stable, on room air. Notable Labs: Sodium 122, K3.3, chloride 85, glucose 125, BUN 9, creatinine 0.65, lipase 111, AST 72, ALT 25, T bili 1.4, lactic acid 1.5, COVID-19 and flu negative. Notable Imaging: CXR-persistent left hilar/perihilar density noted with persistent atelectasis at left lung base, small left pleural effusion again noted similar  findings noted on PET/CT without any new findings present..  CT head-no acute intracranial abnormality with moderate generalized atrophy similar to prior exam likely chronic microvascular ischemia.  Patient received 500 cc NS bolus and Protonix.    Vitals:   01/05/21 1205 01/05/21 1230  BP: 105/72 111/72  Pulse: (!) 117 (!) 109  Resp: 18   Temp:    SpO2: 99% 98%     Review of Systems:  Review of Systems  All other systems reviewed and are negative.   Medical/Social/Family History   Past Medical History: Past Medical History:  Diagnosis Date  . Alcoholism ([place])   . Anemia    iron deficiency  . Cancer ([place])    lung Stage I- 10/11/20  . Collapse of left lung    lower lobe  . COPD (chronic obstructive pulmonary disease) ([place])   . Depression   . Dyspnea    Exertion  . Family history of adverse reaction to anesthesia    sister had a bad headache  . Hypertension    10/11/20 - not in years  . Mediastinal adenopathy     and endobronchial lesion  . Pneumonia   . TB (pulmonary tuberculosis)   . Wears dentures     Past Surgical History:  Procedure Laterality Date  . ESOPHAGOGASTRODUODENOSCOPY (EGD) WITH PROPOFOL N/A 10/28/2020   Procedure:  ESOPHAGOGASTRODUODENOSCOPY (EGD) WITH PROPOFOL;  Surgeon: Milus Banister, MD;  Location: [facility];  Service: Endoscopy;  Laterality: N/A;  . EUS N/A 10/28/2020   Procedure: UPPER ENDOSCOPIC ULTRASOUND (EUS) RADIAL;  Surgeon: Milus Banister, MD;  Location: [facility];  Service: Endoscopy;  Laterality: N/A;  . FINE NEEDLE ASPIRATION N/A 10/28/2020   Procedure: FINE NEEDLE ASPIRATION (FNA) LINEAR;  Surgeon: Milus Banister, MD;  Location: [facility];  Service: Endoscopy;  Laterality: N/A;  . MULTIPLE TOOTH EXTRACTIONS    . VIDEO BRONCHOSCOPY N/A 10/12/2020   Procedure: VIDEO BRONCHOSCOPY WITH CRYOTHERAPY AND BALLOON DILATATION;  Surgeon: Garner Nash, DO;  Location: [facility];  Service: Pulmonary;  Laterality: N/A;  . VIDEO  BRONCHOSCOPY WITH ENDOBRONCHIAL ULTRASOUND N/A 10/02/2018   Procedure: VIDEO BRONCHOSCOPY WITH ENDOBRONCHIAL ULTRASOUND;  Surgeon: Garner Nash, DO;  Location: [facility];  Service: Thoracic;  Laterality: N/A;  . VIDEO BRONCHOSCOPY WITH ENDOBRONCHIAL ULTRASOUND N/A 10/12/2020   Procedure: VIDEO BRONCHOSCOPY WITH ENDOBRONCHIAL ULTRASOUND;  Surgeon: Garner Nash, DO;  Location: [facility];  Service: Pulmonary;  Laterality: N/A;    Medications: Prior to Admission medications   Medication Sig Start Date End Date Taking? Authorizing Provider  albuterol (PROVENTIL) (2.5 MG/3ML) 0.083% nebulizer solution TAKE 3ML BY MOUTH VIA NEBULIZER EVERY 4 HOURS AS NEEDED FOR WHEEZING OR SHORTNESS OF BREATH Patient taking differently: Take 2.5 mg by nebulization every 4 (four) hours as needed (wheezing/shortness of breath.). 09/15/20   Icard, Octavio Graves, DO  albuterol (VENTOLIN HFA) 108 (90 Base) MCG/ACT inhaler INHALE 2 PUFFS BY MOUTH EVERY 6 HOURS AS NEEDED FOR SHORTNESS OF BREATH 12/20/20   Icard, Octavio Graves, DO  ascorbic acid (VITAMIN C) 500 MG tablet Take 500 mg by mouth daily.    [provider]  cholecalciferol (VITAMIN D3) 25 MCG (1000 UNIT) tablet Take 1,000 Units by mouth daily.    [provider]  feeding supplement, ENSURE ENLIVE, (ENSURE ENLIVE) LIQD Take 237 mLs by mouth 2 (two) times daily between meals. Patient taking differently: Take 237 mLs by mouth daily as needed (supplement). 09/24/18   Florencia Reasons, MD  fexofenadine (ALLEGRA) 180 MG tablet Take 180 mg by mouth daily.    [provider]  Fluticasone-Umeclidin-Vilant (TRELEGY ELLIPTA) 100-62.5-25 MCG/INH AEPB Inhale 1 puff into the lungs daily. Patient not taking: Reported on 12/24/2020 12/20/20   Garner Nash, DO  Fluticasone-Umeclidin-Vilant (TRELEGY ELLIPTA) 100-62.5-25 MCG/INH AEPB Inhale 1 puff into the lungs daily. 12/20/20   Icard, Octavio Graves, DO  ibuprofen (ADVIL,MOTRIN) 200 MG tablet Take 400 mg by mouth daily as needed  for headache or moderate pain. Patient not taking: Reported on 12/24/2020    [provider]  mirtazapine (REMERON) 7.5 MG tablet Take 1 tablet (7.5 mg total) by mouth at bedtime. 12/24/20   Ladell Pier, MD  ondansetron (ZOFRAN) 8 MG tablet Take 1 tablet (8 mg total) by mouth every 8 (eight) hours as needed for nausea or vomiting. Patient not taking: Reported on 12/24/2020 11/12/20   Ladell Pier, MD  potassium chloride SA (KLOR-CON) 20 MEQ tablet Take 1 tablet (20 mEq total) by mouth daily. 12/03/20   Owens Shark, NP  prochlorperazine (COMPAZINE) 10 MG tablet Take 1 tablet (10 mg total) by mouth every 6 (six) hours as needed for nausea or vomiting. Patient not taking: Reported on 12/24/2020 11/12/20   Ladell Pier, MD    Allergies:  No Known Allergies  Social History:  reports that he quit smoking about 2  years ago. His smoking use included cigarettes. He smoked 2.00 packs per day. His smokeless tobacco use includes snuff. He reports current alcohol use of about 4.0 standard drinks of alcohol per week. He reports that he does not use drugs.  Family History: Family History  Problem Relation Age of Onset  . Heart disease Mother   . Heart disease Father   . Diabetes Other   . Hypertension Other   . Ovarian cancer Maternal Grandmother      Objective   Physical Exam: Blood pressure 111/72, pulse (!) 109, temperature 98.5 F (36.9 C), temperature source Oral, resp. rate 18, height 5\' 4"  (1.626 m), weight 65.8 kg, SpO2 98 %.  Physical Exam Vitals and nursing note reviewed.  Constitutional:      Appearance: Normal appearance.  HENT:     Head: Normocephalic and atraumatic.  Eyes:     Conjunctiva/sclera: Conjunctivae normal.  Cardiovascular:     Rate and Rhythm: Normal rate and regular rhythm.  Pulmonary:     Effort: Pulmonary effort is normal.     Breath sounds: Normal breath sounds.  Abdominal:     General: Abdomen is flat.     Palpations: Abdomen is soft.   Musculoskeletal:        General: No swelling or tenderness.  Skin:    Coloration: Skin is pale. Skin is not jaundiced.  Neurological:     Mental Status: He is alert. Mental status is at baseline.  Psychiatric:        Mood and Affect: Mood normal.        Behavior: Behavior normal.     LABS on Admission: I have personally reviewed all the labs and imaging below    Basic Metabolic Panel: Recent Labs  Lab 01/05/21 0950  NA 122*  K 3.3*  CL 85*  CO2 22  GLUCOSE 125*  BUN 9  CREATININE 0.65  CALCIUM 8.3*   Liver Function Tests: Recent Labs  Lab 01/05/21 0950  AST 72*  ALT 25  ALKPHOS 87  BILITOT 1.4*  PROT 6.2*  ALBUMIN 2.7*   Recent Labs  Lab 01/05/21 0950  LIPASE 111*   No results for input(s): AMMONIA in the last 168 hours. CBC: Recent Labs  Lab 01/05/21 0916 01/05/21 1230  WBC 5.0  --   NEUTROABS 3.3  --   HGB 9.3* 7.5*  HCT 25.9* 21.7*  MCV 95.2  --   PLT 144*  --    Cardiac Enzymes: No results for input(s): CKTOTAL, CKMB, CKMBINDEX, TROPONINI in the last 168 hours. BNP: Invalid input(s): POCBNP CBG: No results for input(s): GLUCAP in the last 168 hours.  Radiological Exams on Admission:  DG Chest 2 View  Result Date: 01/05/2021 CLINICAL DATA:  Shortness of breath. EXAM: CHEST - 2 VIEW COMPARISON:  PET CT 09/30/2020.  Chest x-ray 01/10/2019. FINDINGS: Mediastinum is stable. Persistent left hilar/perihilar density is again noted. Persistent atelectasis left lung base. Small left pleural effusion again noted. No pneumothorax. No acute bony abnormality identified. IMPRESSION: Persistent left hilar/perihilar density is again noted. Persistent atelectasis left lung base. Small left pleural effusion again noted. Similar findings noted on PET-CT. Reference is made to PET-CT of 09/30/2020. No new findings present. Electronically Signed   By: Marcello Moores  Register   On: 01/05/2021 10:17   CT Head Wo Contrast  Result Date: 01/05/2021 CLINICAL DATA:  Head trauma.   Abdominal pain.  Abnormal stool. EXAM: CT HEAD WITHOUT CONTRAST TECHNIQUE: Contiguous axial images were obtained from the base  of the skull through the vertex without intravenous contrast. COMPARISON:  CT head without contrast 09/20/2018 FINDINGS: Brain: Moderate generalized atrophy is present. Minimal white matter changes are within normal limits for age. Atrophy is disc proportionate in the frontal lobes, similar the prior exam. No acute infarct, hemorrhage, or mass lesion is present. The basal ganglia are within normal limits. No acute or focal cortical abnormality is present. The brainstem and cerebellum are within normal limits. Vascular: No hyperdense vessel or unexpected calcification. Skull: No significant extracranial soft tissue lesion is present. Calvarium is intact. No focal lytic or blastic lesions are present. Sinuses/Orbits: The paranasal sinuses and mastoid air cells are clear. The globes and orbits are within normal limits. IMPRESSION: 1. No acute intracranial abnormality or significant interval change. 2. Moderate generalized atrophy is proportionate in the frontal lobes, similar to the prior exam. This likely reflects the sequela of chronic microvascular ischemia. Electronically Signed   By: San Morelle M.D.   On: 01/05/2021 10:31      EKG: Right axis, NSR without pathologic ST changes   A & P   Active Problems:   Symptomatic anemia   GI bleed   Syncope   1. Symptomatic anemia, concern for GI bleed a. Symptoms include syncope, generalized weakness with hypotension at bedside b. Hb 11.6 (2/25)-> 9.3-> 7.5 c. Transfuse 1 unit PRBCs, consult obtained d. Repeat H&H posttransfusion e. Protonix 40 mg IV twice daily f. N.p.o. g. GI consulted h. Hb goal > 8.0 given active malignancy  2. Syncope, suspect orthostatic in the setting of symptomatic anemia a. Occurred after standing up from having a BM b. MAP 70 at bedside c. Continue IV fluids  3. NSCLC - Squamous cell  carcinoma of the lung with mets to pancreas a. on Taxol/carboplatin/pembrolizumab with most recent treatment on 12/24/2020 b. will notify patient's oncologist, Dr. Benay Spice of patient's current status  4. Alcoholism a. Check ethanol level b. CIWA protocol  5. Reported fever a. Occurred at home, no recurrence here b. Continue to monitor  6. COPD, not in acute exacerbation a. Continue home albuterol and Trelegy Ellipta    DVT prophylaxis: SCDs   Code Status: DNR  Diet: N.p.o. Family Communication: Admission, patients condition and plan of care including tests being ordered have been discussed with the patient who indicates understanding and agrees with the plan and Code Status. Patient's sister was updated  Disposition Plan: The appropriate patient status for this patient is INPATIENT. Inpatient status is judged to be reasonable and necessary in order to provide the required intensity of service to ensure the patient's safety. The patient's presenting symptoms, physical exam findings, and initial radiographic and laboratory data in the context of their chronic comorbidities is felt to place them at high risk for further clinical deterioration. Furthermore, it is not anticipated that the patient will be medically stable for discharge from the hospital within 2 midnights of admission. The following factors support the patient status of inpatient.   " The patient's presenting symptoms include generalized weakness. " The worrisome physical exam findings include pallor, hypotension. " The initial radiographic and laboratory data are worrisome because of anemia. " The chronic co-morbidities include alcohol use, cirrhosis, COPD, malignancy.   * I certify that at the point of admission it is my clinical judgment that the patient will require inpatient hospital care spanning beyond 2 midnights from the point of admission due to high intensity of service, high risk for further deterioration and high  frequency of surveillance required.*  Status is: Inpatient  Remains inpatient appropriate because:IV treatments appropriate due to intensity of illness or inability to take PO and Inpatient level of care appropriate due to severity of illness   Dispo: The patient is from: Home              Anticipated d/c is to: Home              Patient currently is not medically stable to d/c.   Difficult to place patient No       Consultants  . GI . Hematology/oncology  Procedures  . 1 unit PRBCs  Time Spent on Admission: 68 minutes    Harold Hedge, DO [facility]  01/05/2021, 1:59 PM

## 2021-01-05 NOTE — Progress Notes (Signed)
Blood administration handed off to night shift RN Jake.

## 2021-01-05 NOTE — ED Triage Notes (Signed)
Pt reports lower abdominal pain that started yesterday. Pt reports black stool 2 days ago. Pt also reports not sleeping well and having a decreased appetite.

## 2021-01-06 ENCOUNTER — Encounter (HOSPITAL_COMMUNITY): Payer: Self-pay | Admitting: Internal Medicine

## 2021-01-06 ENCOUNTER — Inpatient Hospital Stay (HOSPITAL_COMMUNITY): Payer: Medicare Other | Admitting: Anesthesiology

## 2021-01-06 ENCOUNTER — Encounter (HOSPITAL_COMMUNITY)
Admission: EM | Disposition: A | Discharge status: Home / Self Care | Payer: Self-pay | Source: Home / Self Care | Attending: Internal Medicine

## 2021-01-06 DIAGNOSIS — C3432 Malignant neoplasm of lower lobe, left bronchus or lung: Secondary | ICD-10-CM

## 2021-01-06 DIAGNOSIS — K3189 Other diseases of stomach and duodenum: Secondary | ICD-10-CM

## 2021-01-06 HISTORY — PX: BIOPSY: SHX5522

## 2021-01-06 HISTORY — PX: ESOPHAGOGASTRODUODENOSCOPY (EGD) WITH PROPOFOL: SHX5813

## 2021-01-06 LAB — CBC
HCT: 26.8 % — ABNORMAL LOW (ref 39.0–52.0)
Hemoglobin: 9.8 g/dL — ABNORMAL LOW (ref 13.0–17.0)
MCH: 34 pg (ref 26.0–34.0)
MCHC: 36.6 g/dL — ABNORMAL HIGH (ref 30.0–36.0)
MCV: 93.1 fL (ref 80.0–100.0)
Platelets: 121 10*3/uL — ABNORMAL LOW (ref 150–400)
RBC: 2.88 MIL/uL — ABNORMAL LOW (ref 4.22–5.81)
RDW: 14.8 % (ref 11.5–15.5)
WBC: 5.4 10*3/uL (ref 4.0–10.5)
nRBC: 0 % (ref 0.0–0.2)

## 2021-01-06 LAB — TYPE AND SCREEN
ABO/RH(D): A POS
Antibody Screen: NEGATIVE
Unit division: 0

## 2021-01-06 LAB — BPAM RBC
Blood Product Expiration Date: 202204062359
ISSUE DATE / TIME: 202203091839
Unit Type and Rh: 6200

## 2021-01-06 LAB — BASIC METABOLIC PANEL
Anion gap: 12 (ref 5–15)
BUN: 8 mg/dL (ref 8–23)
CO2: 23 mmol/L (ref 22–32)
Calcium: 8.3 mg/dL — ABNORMAL LOW (ref 8.9–10.3)
Chloride: 92 mmol/L — ABNORMAL LOW (ref 98–111)
Creatinine, Ser: 0.85 mg/dL (ref 0.61–1.24)
GFR, Estimated: >60 mL/min (ref >60)
Glucose, Bld: 95 mg/dL (ref 70–99)
Potassium: 2.6 mmol/L — CL (ref 3.5–5.1)
Sodium: 127 mmol/L — ABNORMAL LOW (ref 135–145)

## 2021-01-06 LAB — MAGNESIUM: Magnesium: 1.4 mg/dL — ABNORMAL LOW (ref 1.7–2.4)

## 2021-01-06 SURGERY — ESOPHAGOGASTRODUODENOSCOPY (EGD) WITH PROPOFOL
Anesthesia: Monitor Anesthesia Care

## 2021-01-06 MED ORDER — POTASSIUM CHLORIDE 10 MEQ/100ML IV SOLN
10.0000 meq | INTRAVENOUS | Status: DC
Start: 2021-01-06 — End: 2021-01-06
  Administered 2021-01-06 (×2): 10 meq via INTRAVENOUS
  Filled 2021-01-06: qty 100

## 2021-01-06 MED ORDER — PROPOFOL 500 MG/50ML IV EMUL
INTRAVENOUS | Status: DC | PRN
  Administered 2021-01-06: 60 mg via INTRAVENOUS
  Administered 2021-01-06: 100 ug/kg/min via INTRAVENOUS

## 2021-01-06 MED ORDER — MAGNESIUM SULFATE 4 GM/100ML IV SOLN
4.0000 g | INTRAVENOUS | Status: AC
  Administered 2021-01-06: 4 g via INTRAVENOUS
  Filled 2021-01-06: qty 100

## 2021-01-06 MED ORDER — POTASSIUM CHLORIDE CRYS ER 20 MEQ PO TBCR
20.0000 meq | EXTENDED_RELEASE_TABLET | ORAL | Status: AC
  Administered 2021-01-06: 20 meq via ORAL
  Filled 2021-01-06: qty 1

## 2021-01-06 MED ORDER — LACTATED RINGERS IV SOLN: INTRAVENOUS | Status: DC | PRN

## 2021-01-06 MED ORDER — SODIUM CHLORIDE 0.9 % IV SOLN: INTRAVENOUS | Status: DC

## 2021-01-06 MED ORDER — POTASSIUM CHLORIDE 10 MEQ/100ML IV SOLN
10.0000 meq | INTRAVENOUS | Status: AC
  Administered 2021-01-06 (×3): 10 meq via INTRAVENOUS
  Filled 2021-01-06 (×3): qty 100

## 2021-01-06 MED ORDER — LIDOCAINE 2% (20 MG/ML) 5 ML SYRINGE
INTRAMUSCULAR | Status: DC | PRN
  Administered 2021-01-06: 60 mg via INTRAVENOUS

## 2021-01-06 SURGICAL SUPPLY — 14 items

## 2021-01-06 NOTE — Transfer of Care (Signed)
Immediate Anesthesia Transfer of Care Note  Patient: Ronald [name]  Procedure(s) Performed: ESOPHAGOGASTRODUODENOSCOPY (EGD) WITH PROPOFOL (N/A ) BIOPSY  Patient Location: Endoscopy Unit  Anesthesia Type:MAC  Level of Consciousness: awake, alert , oriented and patient cooperative  Airway & Oxygen Therapy: Patient Spontanous Breathing and Patient connected to face mask oxygen  Post-op Assessment: Report given to RN, Post -op Vital signs reviewed and stable and Patient moving all extremities  Post vital signs: Reviewed and stable  Last Vitals:  Vitals Value Taken Time  BP    Temp    Pulse    Resp    SpO2      Last Pain:  Vitals:   01/06/21 1059  TempSrc: Oral  PainSc: 0-No pain         Complications: No complications documented.

## 2021-01-06 NOTE — Progress Notes (Addendum)
HEMATOLOGY-ONCOLOGY PROGRESS NOTE  SUBJECTIVE: [facility] [name] is followed by [place] for non-small cell lung cancer.  He has been receiving treatment with Taxol/carboplatin/pembrolizumab every 3 weeks.  He received his third cycle on 12/24/2020.  [facility] with [facility].  He had a fever up to 102 at home earlier this week as well as a cough and black stools.  He was advised to come to [facility] emergency room but did not initially come as directed.  In [facility] ER, his sodium was low at 122, potassium 3.3, AST 72, ALT 25, T bili 1.4.  His hemoglobin was 9.3 and platelets were 144,000.  His hemoglobin dropped down to 7.5 and he received 1 unit PRBCs. Ronald [name] has been seen by GI with plans for EGD later today.  This morning, Ronald [name] has not seen any more melena in his stools.  He denies abdominal pain, nausea, vomiting.  No hematemesis.  Oncology History  Lung cancer ([place])  11/05/2018 Initial Diagnosis   Lung cancer ([place])   11/05/2018 Cancer Staging   Staging form: Lung, AJCC 8th Edition - Clinical: cT2, cN0, cM0 - Signed by Ladell Pier, MD on 11/05/2018   11/22/2018 - 12/27/2018 Chemotherapy   [facility]onald [name] had palonosetron (ALOXI) injection 0.25 mg, 0.25 mg, Intravenous,  Once, 1 of 1 cycle Administration: 0.25 mg (11/22/2018), 0.25 mg (11/29/2018), 0.25 mg (12/06/2018), 0.25 mg (12/13/2018), 0.25 mg (12/20/2018), 0.25 mg (12/27/2018) CARBOplatin (PARAPLATIN) 190 mg in sodium chloride 0.9 % 250 mL chemo infusion, 190 mg (97.5 % of original dose 197.6 mg), Intravenous,  Once, 1 of 1 cycle Dose modification:   (original dose 197.6 mg, Cycle 1) Administration: 190 mg (11/22/2018), 210 mg (11/29/2018), 210 mg (12/06/2018), 210 mg (12/13/2018), 210 mg (12/20/2018), 150 mg (12/27/2018) PACLitaxel (TAXOL) 84 mg in sodium chloride 0.9 % 250 mL chemo infusion (</= 41m/m2), 50 mg/m2 = 84 mg, Intravenous,  Once, 1 of 1 cycle Administration: 84 mg (11/22/2018), 84 mg (11/29/2018), 84 mg (12/06/2018), 84 mg  (12/13/2018), 84 mg (12/20/2018), 84 mg (12/27/2018)  for chemotherapy treatment.    03/28/2019 - 04/11/2019 Chemotherapy   Ronald [name] had durvalumab (IMFINZI) 620 mg in sodium chloride 0.9 % 100 mL chemo infusion, 9.4 mg/kg = 660 mg, Intravenous,  Once, 2 of 6 cycles Administration: 620 mg (03/28/2019), 620 mg (04/11/2019)  for chemotherapy treatment.    11/12/2020 -  Chemotherapy    [name] is on Treatment Plan: LUNG NSCLC CARBOPLATIN + PACLITAXEL + PEMBROLIZUMAB Q21D X 4 CYCLES / PEMBROLIZUMAB MAINTENANCE Q21D      Endobronchial cancer, left ([place]  10/12/2020 Initial Diagnosis   [place]   11/03/2020 Cancer Staging   Staging form: Lung, AJCC 8th Edition - Clinical: Stage IV (pM1) - Signed by SLadell Pier MD on 11/03/2020    PHYSICAL EXAMINATION:  Vitals:   01/06/21 0902 01/06/21 0926  BP:    Pulse:    Resp:    Temp:    SpO2: 99% 99%   Filed Weights   01/05/21 0838  Weight: 65.8 kg    Intake/Output from previous day: 03/09 0701 - 03/10 0700 In: 1465.1 [I.V.:462.4; Blood:364.6; IV Piggyback:638.2] Out: 220 [Urine:220]  GENERAL:alert, no distress and comfortable SKIN: skin color, texture, turgor are normal, no rashes or significant lesions EYES: normal, Conjunctiva are pink and non-injected, sclera clear OROPHARYNX:no exudate, no erythema and lips, buccal mucosa, and tongue normal  LUNGS: clear to auscultation and percussion with normal breathing effort HEART: regular rate & rhythm  and no murmurs and no lower extremity edema ABDOMEN:abdomen soft, non-tender and normal bowel sounds NEURO: alert & oriented x 3 with fluent speech, no focal motor/sensory deficits  LABORATORY DATA:  I have reviewed [facility] data as listed CMP Latest Ref Rng & Units 01/06/2021 01/05/2021 12/24/2020  Glucose 70 - 99 mg/dL 95 125(H) 122(H)  BUN 8 - 23 mg/dL '8 9 9  ' Creatinine 0.61 - 1.24 mg/dL 0.85 0.65 0.73  Sodium 135 - 145 mmol/L 127(L) 122(L) 136  Potassium 3.5 - 5.1  mmol/L 2.6(LL) 3.3(L) 3.6  Chloride 98 - 111 mmol/L 92(L) 85(L) 103  CO2 22 - 32 mmol/L 23 22 21(L)  Calcium 8.9 - 10.3 mg/dL 8.3(L) 8.3(L) 8.8(L)  Total Protein 6.5 - 8.1 g/dL - 6.2(L) 6.9  Total Bilirubin 0.3 - 1.2 mg/dL - 1.4(H) 0.4  Alkaline Phos 38 - 126 U/L - 87 125  AST 15 - 41 U/L - 72(H) 16  ALT 0 - 44 U/L - 25 10    Lab Results  Component Value Date   WBC 5.4 01/06/2021   HGB 9.8 (L) 01/06/2021   HCT 26.8 (L) 01/06/2021   MCV 93.1 01/06/2021   PLT 121 (L) 01/06/2021   NEUTROABS 3.3 01/05/2021    DG Chest 2 View  Result Date: 01/05/2021 CLINICAL DATA:  Shortness of breath. EXAM: CHEST - 2 VIEW COMPARISON:  PET CT 09/30/2020.  Chest x-ray 01/10/2019. FINDINGS: Mediastinum is stable. Persistent left hilar/perihilar density is again noted. Persistent atelectasis left lung base. Small left pleural effusion again noted. No pneumothorax. No acute bony abnormality identified. IMPRESSION: Persistent left hilar/perihilar density is again noted. Persistent atelectasis left lung base. Small left pleural effusion again noted. Similar findings noted on PET-CT. Reference is made to PET-CT of 09/30/2020. No new findings present. Electronically Signed   By: Marcello Moores  Register   On: 01/05/2021 10:17   CT Head Wo Contrast  Result Date: 01/05/2021 CLINICAL DATA:  Head trauma.  Abdominal pain.  Abnormal stool. EXAM: CT HEAD WITHOUT CONTRAST TECHNIQUE: Contiguous axial images were obtained from Ronald base of Ronald skull through Ronald vertex without intravenous contrast. COMPARISON:  CT head without contrast 09/20/2018 FINDINGS: Brain: Moderate generalized atrophy is present. Minimal white matter changes are within normal limits for age. Atrophy is disc proportionate in Ronald frontal lobes, similar Ronald prior exam. No acute infarct, hemorrhage, or mass lesion is present. Ronald basal ganglia are within normal limits. No acute or focal cortical abnormality is present. Ronald brainstem and cerebellum are within normal  limits. Vascular: No hyperdense vessel or unexpected calcification. Skull: No significant extracranial soft tissue lesion is present. Calvarium is intact. No focal lytic or blastic lesions are present. Sinuses/Orbits: Ronald paranasal sinuses and mastoid air cells are clear. Ronald globes and orbits are within normal limits. IMPRESSION: 1. No acute intracranial abnormality or significant interval change. 2. Moderate generalized atrophy is proportionate in Ronald frontal lobes, similar to Ronald prior exam. This likely reflects Ronald sequela of chronic microvascular ischemia. Electronically Signed   By: San Morelle M.D.   On: 01/05/2021 10:31   CT ABDOMEN PELVIS W CONTRAST  Result Date: 01/05/2021 CLINICAL DATA:  Abdominal distension. 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 EXAM: CT ABDOMEN AND PELVIS WITH CONTRAST TECHNIQUE: Multidetector CT imaging of Ronald abdomen and pelvis was performed using Ronald standard protocol following bolus administration of intravenous contrast. CONTRAST:  22m OMNIPAQUE IOHEXOL 300 MG/ML  SOLN COMPARISON:  PET-CT dated September 30, 2020. FINDINGS:  Lower chest: There is partial collapse of Ronald left lower lobe, similar to prior study.Ronald heart size is normal. Hepatobiliary: Ronald liver is normal. Normal gallbladder.There is apparent ductal dilatation of Ronald distal common bile duct. Pancreas: Ronald previously demonstrated pancreatic mass appears to have substantially decreased in size from Ronald prior study. An exact measurement is difficult to determine given respiratory motion artifact. There is an ill-defined masslike area adjacent to Ronald pancreatic body and head measuring approximately 4.9 x 5.2 cm. Spleen: Unremarkable. Adrenals/Urinary Tract: --Adrenal glands: Unremarkable. --Right kidney/ureter: No hydronephrosis or radiopaque kidney stones. --Left kidney/ureter: No hydronephrosis or radiopaque kidney stones. --Urinary bladder: Unremarkable.  Stomach/Bowel: --Stomach/Duodenum: No hiatal hernia or other gastric abnormality. Normal duodenal course and caliber. --Small bowel: Unremarkable. --Colon: There are air-fluid levels throughout Ronald colon. No evidence for an obstruction. --Appendix: Normal. Vascular/Lymphatic: Atherosclerotic calcification is present within Ronald non-aneurysmal abdominal aorta, without hemodynamically significant stenosis. --No retroperitoneal lymphadenopathy. --No mesenteric lymphadenopathy. --No pelvic or inguinal lymphadenopathy. Reproductive: Unremarkable Other: There is a trace amount of free fluid in Ronald upper abdomen. Ronald abdominal wall is normal. Musculoskeletal. No acute displaced fractures. IMPRESSION: 1. There are air-fluid levels throughout Ronald colon, which can be seen in patients with diarrhea. 2. Ronald previously demonstrated pancreatic mass appears to have substantially decreased in size from Ronald prior study. There is an ill-defined masslike area adjacent to Ronald pancreatic body and head measuring approximately 4.9 x 5.2 cm. This could represent a metastatic lesion versus sequela of pancreatitis. 3. Persistent partial collapse of Ronald left lower lobe. 4. Trace ascites. Aortic Atherosclerosis (ICD10-I70.0). Electronically Signed   By: Constance Holster M.D.   On: 01/05/2021 16:07    ASSESSMENT AND PLAN: 1. Non-small cell lung cancer-squamous cell carcinoma  CT chest 09/19/2018 concerning for an obstructing left lower lobe mass, small left pleural effusion, 4 mm right lower lobe nodule  CT head 09/20/2018-no acute abnormality  Bronchoscopy 10/02/2018-complete occlusion of Ronald left lower lobe with an obstructing mass, biopsies, washing, and brushing reveal squamous cell carcinoma, biopsies of level 7 and 4L nodes-negative  PET scan 10/08/2018-hypermetabolic left hilar mass obstructing Ronald left lower lobe bronchus with left lower lobe collapse, no evidence of metastatic lymphadenopathy or distant  metastases  Clinical stage Ib (T2N0)  Radiation beginning 11/19/2018  Cycle 1 weekly Taxol/carboplatin 11/22/2018  Cycle 2 weekly Taxol/carboplatin 11/29/2018  Cycle 3 weekly Taxol/carboplatin2/04/2019  Cycle 4 weekly Taxol/carboplatin 12/13/2018  Cycle 5 weekly Taxol/carboplatin 12/20/2018  Cycle 6 weekly Taxol/carboplatin 12/27/2018  CT 02/24/2019-stable left lower lobe collapse, decreased left pleural effusion, abnormal soft tissue in Ronald inferior left hilum  Durvalumab 03/28/2019 and 04/11/2019, [name] then lost to follow-up  PET 09/30/2020-residual/recurrent disease of Ronald left hilum with associated basilar collapse, slight increase in pleural fluid with small area of nodularity with equivocal uptake at Ronald peripher of consolidated lung, hypermetabolic pancreas head mass, mild asymmetry at Ronald left glossotonsillar region, cirrhosis  10/12/2020 bronchoscopy with cryotherapy, tumor debulking, lesional excision, and balloon dilatation-occluding tumor was noted in Ronald left lower lobe with a small visible opening of Ronald left upper lobe anterior segment, tumor occluded Ronald lingula, no visible opening of Ronald left lower lobe, Ronald left mainstem bifurcation was not recognized due to necrotic tumor, biopsies were taken of level 7 nodes, tumor was debulked from Ronald left upper lobe orifice and cryotherapy/dilation procedures were performed, Ronald pathology revealed squamous cell carcinoma involving an endobronchial biopsy, Ronald biopsy from Ronald level 7 node revealed no malignant cells.PD-L1 0%  EUS 10/28/2020-no esophageal varices, mild  portal gastropathy, no gastric varices, 4.1 cm round mass in Ronald pancreas head, a pancreatic and common bile duct were nondilated, no peripancreatic adenopathy, FNA biopsy-metastatic squamous cell carcinoma  Cycle 1 Taxol/carboplatin/Pembrolizumab 11/12/2020  Cycle 2 Taxol/carboplatin/Pembrolizumab 12/03/2020, carboplatin dose reduced, Fulphila  Cycle 3  Taxol/carboplatin/pembrolizumab 12/24/2020, Fulphila  CT abdomen/pelvis 01/05/2021-previously demonstrated pancreatic mass appears to have substantially decreased in size from Ronald prior study, ill-defined masslike area adjacent to Ronald pancreatic body and head measuring 4.9 x 5.2 cm which could represent a metastatic lesion versus sequela of pancreatitis, persistent partial collapse of Ronald left lower lobe. 2.Tobacco and alcohol use 3.COPD 4.Weight loss 5.Anemia 6.History of tuberculosis 7.Changes of cirrhosis on Ronald PET scan 10/08/2018 8. Hypermetabolic pancreas head mass on PET scan 09/30/2020 9.  Hospital admission 01/05/2021-symptomatic anemia, melena  [name] has been admitted to [facility] for symptomatic anemia and melena.  His hemoglobin dropped to 7.5 this admission and due to concern for GI bleed, received 1 unit PRBCs.  He denies further melena.  [facility]onald [name] has been seen by GI with plans for EGD later today.  [facility]onald [name] is currently receiving treatment with Taxol/carboplatin/pembrolizumab.  Symptomatic anemia and melena unlikely to be related to current treatment.  Recommendations: 1.  EGD by GI later today 2.  Monitor hemoglobin and transfuse for hemoglobin less than 8 3.  Keep outpatient follow-up as previously scheduled at [facility].   LOS: 1 day   Mikey Bussing, DNP, AGPCNP-BC, AOCNP 01/06/21 Mr. Behan was interviewed and examined.  He is now at day 14 following cycle 3 systemic therapy for treatment of metastatic non-small cell lung cancer.  He was admitted with a fever and anemia.  He reports black stool.  He may have GI bleeding related to peptic ulcer disease or portal gastropathy.  I doubt Ronald bleeding is directly related to Ronald systemic therapy.  It is also possible he has developed tumor involving Ronald GI tract.  Ronald CT yesterday was read as revealing a significant decrease in Ronald size of Ronald pancreas mass.  I cannot appreciate a significant  difference on review of Ronald images.  We will plan to continue Taxol/carboplatin/pembrolizumab.  He is scheduled for an upper endoscopy today.  [facility].  I was present for greater than 50% of today's visit.  I before medical decision making.  Julieanne Manson, MD

## 2021-01-06 NOTE — TOC Progression Note (Signed)
Transition of Care [facility]) - Progression Note    Patient Details  Name: Ronald [name] MRN: [id number] Date of Birth: August 22, 1956  [facility] CM/SW Contact  Purcell Mouton, RN Phone Number: 01/06/2021, 3:18 PM  Clinical Narrative:    At present time pt plan to discharge home. Daughter is living with pt. TOC will continue to follow.    Expected Discharge Plan: Home/Self Care Barriers to Discharge: No Barriers Identified  Expected Discharge Plan and Services Expected Discharge Plan: Home/Self Care       Living arrangements for the past 2 months: Single Family Home                                       Social Determinants of Health (SDOH) Interventions    Readmission Risk Interventions No flowsheet data found.

## 2021-01-06 NOTE — Plan of Care (Signed)
  Problem: Education: Goal: Knowledge of General Education information will improve Description: Including pain rating scale, medication(s)/side effects and non-pharmacologic comfort measures Outcome: Progressing   Problem: Health Behavior/Discharge Planning: Goal: Ability to manage health-related needs will improve Outcome: Progressing   Problem: Pain Managment: Goal: General experience of comfort will improve Outcome: Progressing   

## 2021-01-06 NOTE — Progress Notes (Signed)
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 intake, falls and syncope. On 3/7 the patient's sister called his oncology office with new onset syncope and loss of bowel control, black stools and bilateral lower extremity weakness and he was advised to go to the ED however the patient had refused and so did not go.  EMS was called to the home on 3/7 due to 102.5 F fever, tachycardia 123 bpm with the syncopal episode.  Also with a cough and black stools.  This prompted him to come to the ED, patient admitted for further work-up for possible upper GI bleed, GI consulted at intake.  Assessment & Plan:   Active Problems:   Symptomatic anemia   GI bleed   Syncope  Acute symptomatic anemia, concern for GI bleed Symptoms include syncope, generalized weakness with hypotension at bedside Hemoglobin baseline around 11-12, 7.5 at admission, status post 1 unit PRBC  Repeat H&H 9.8 Protonix 40 mg IV twice daily N.p.o. GI consulted -follow for possible endoscopy, patient denies any history of upper or lower endoscopy; unclear if he will agree to endoscopy here  Syncope, suspect orthostatic in the setting of symptomatic anemia Occurred after standing up from having a BM MAP 70 at bedside Continue IV fluids Follow orthostatic blood pressure  Hypokalemia, questionably symptomatic Likely in the setting of GI losses Repleted, follow morning labs  NSCLC - Squamous cell carcinoma of the lung with mets to pancreas on  Taxol/carboplatin/pembrolizumab with most recent treatment on 12/24/2020 Oncology aware  Reported alcohol abuse/use Ethanol level low CIWA protocol ongoing  COPD, not in acute exacerbation Continue home albuterol and Trelegy Ellipta   DVT prophylaxis: SCDs only given above Code Status: DNR Family Communication: None present  Status is: Inpatient  Dispo: The patient is from: Home              Anticipated d/c is to: Home              Anticipated d/c date is: 24 to 48 hours              Patient currently not medically stable for discharge  Consultants:   GI  Procedures:   Endoscopy, pending eval  Antimicrobials:  None  Subjective: No acute issues or events overnight denies nausea vomiting diarrhea constipation headache fevers or chills.  Objective: Vitals:   01/05/21 2155 01/05/21 2330 01/06/21 0327 01/06/21 0504  BP: (!) 105/56 (!) 102/51 104/63 (!) 104/56  Pulse: (!) 103 95 98 97  Resp: 20 20 (!) 21 (!) 21  Temp: 98.2 F (36.8 C) 99.8 F (37.7 C) 98.2 F (36.8 C) 98.6 F (37 C)  TempSrc: Oral Oral Oral Oral  SpO2: 99% 98% 98% 97%  Weight:      Height:        Intake/Output Summary (Last 24 hours) at 01/06/2021 0750 Last data filed at 01/06/2021 8119 Gross per 24 hour  Intake 1465.12 ml  Output 220 ml  Net 1245.12 ml   Filed Weights   01/05/21 0838  Weight: 65.8 kg  Examination:  General:  Pleasantly resting in bed, No acute distress. HEENT:  Normocephalic atraumatic.  Sclerae nonicteric, noninjected.  Extraocular movements intact bilaterally. Neck:  Without mass or deformity.  Trachea is midline. Lungs:  Clear to auscultate bilaterally without rhonchi, wheeze, or rales. Heart:  Regular rate and rhythm.  Without murmurs, rubs, or gallops. Abdomen:  Soft, nontender, nondistended.  Without guarding or rebound. Extremities: Without cyanosis, clubbing, edema, or obvious deformity. Vascular:  Dorsalis pedis and posterior tibial pulses palpable  bilaterally. Skin:  Warm and dry, no erythema, no ulcerations.  Data Reviewed: I have personally reviewed following labs and imaging studies  CBC: Recent Labs  Lab 01/05/21 0916 01/05/21 1230 01/05/21 1620 01/06/21 0440  WBC 5.0  --   --  5.4  NEUTROABS 3.3  --   --   --   HGB 9.3* 7.5* 7.8* 9.8*  HCT 25.9* 21.7* 22.6* 26.8*  MCV 95.2  --   --  93.1  PLT 144*  --   --  742*   Basic Metabolic Panel: Recent Labs  Lab 01/05/21 0950 01/06/21 0440  NA 122* 127*  K 3.3* 2.6*  CL 85* 92*  CO2 22 23  GLUCOSE 125* 95  BUN 9 8  CREATININE 0.65 0.85  CALCIUM 8.3* 8.3*  MG 1.5* 1.4*  PHOS 2.8  --    GFR: Estimated Creatinine Clearance: 71.6 mL/min (by C-G formula based on SCr of 0.85 mg/dL). Liver Function Tests: Recent Labs  Lab 01/05/21 0950  AST 72*  ALT 25  ALKPHOS 87  BILITOT 1.4*  PROT 6.2*  ALBUMIN 2.7*   Recent Labs  Lab 01/05/21 0950  LIPASE 111*   No results for input(s): AMMONIA in the last 168 hours. Coagulation Profile: Recent Labs  Lab 01/05/21 0916  INR 1.3*   Cardiac Enzymes: No results for input(s): CKTOTAL, CKMB, CKMBINDEX, TROPONINI in the last 168 hours. BNP (last 3 results) No results for input(s): PROBNP in the last 8760 hours. HbA1C: No results for input(s): HGBA1C in the last 72 hours. CBG: No results for input(s): GLUCAP in the last 168 hours. Lipid Profile: No results for input(s): CHOL, HDL, LDLCALC, TRIG, CHOLHDL, LDLDIRECT in the last 72 hours. Thyroid Function Tests: No results for input(s): TSH, T4TOTAL, FREET4, T3FREE, THYROIDAB in the last 72 hours. Anemia Panel: No results for input(s): VITAMINB12, FOLATE, FERRITIN, TIBC, IRON, RETICCTPCT in the last 72 hours. Sepsis Labs: Recent Labs  Lab 01/05/21 0950  LATICACIDVEN 1.4  1.5    Recent Results (from the past 240 hour(s))  Resp Panel by RT-PCR (Flu A&B, Covid) Nasopharyngeal Swab     Status: None   Collection Time: 01/05/21  9:50 AM   Specimen: Nasopharyngeal  Swab; Nasopharyngeal(NP) swabs in vial transport medium  Result Value Ref Range Status   SARS Coronavirus 2 by RT PCR NEGATIVE NEGATIVE Final    Comment: (NOTE) SARS-CoV-2 target nucleic acids are NOT DETECTED.  The SARS-CoV-2 RNA is generally detectable in upper respiratory specimens during the acute phase of infection. The lowest concentration of SARS-CoV-2 viral copies this assay can detect is 138 copies/mL. A negative result does not preclude SARS-Cov-2 infection and should not be used as the sole basis for treatment or other patient management decisions. A negative result may occur with  improper specimen collection/handling, submission of specimen other than nasopharyngeal swab, presence of viral mutation(s) within the areas targeted by this assay, and inadequate number of viral copies(<138 copies/mL). A negative result must be combined with clinical observations, patient  history, and epidemiological information. The expected result is Negative.  Fact Sheet for Patients:  EntrepreneurPulse.com.au  Fact Sheet for Healthcare Providers:  IncredibleEmployment.be  This test is no t yet approved or cleared by the [place] FDA and  has been authorized for detection and/or diagnosis of SARS-CoV-2 by FDA under an Emergency Use Authorization (EUA). This EUA will remain  in effect (meaning this test can be used) for the duration of the COVID-19 declaration under Section 564(b)(1) of the Act, 21 U.S.C.section 360bbb-3(b)(1), unless the authorization is terminated  or revoked sooner.       Influenza A by PCR NEGATIVE NEGATIVE Final   Influenza B by PCR NEGATIVE NEGATIVE Final    Comment: (NOTE) The Xpert Xpress SARS-CoV-2/FLU/RSV plus assay is intended as an aid in the diagnosis of influenza from Nasopharyngeal swab specimens and should not be used as a sole basis for treatment. Nasal washings and aspirates are unacceptable for Xpert Xpress  SARS-CoV-2/FLU/RSV testing.  Fact Sheet for Patients: EntrepreneurPulse.com.au  Fact Sheet for Healthcare Providers: IncredibleEmployment.be  This test is not yet approved or cleared by the [place] FDA and has been authorized for detection and/or diagnosis of SARS-CoV-2 by FDA under an Emergency Use Authorization (EUA). This EUA will remain in effect (meaning this test can be used) for the duration of the COVID-19 declaration under Section 564(b)(1) of the Act, 21 U.S.C. section 360bbb-3(b)(1), unless the authorization is terminated or revoked.  Performed at [facility], [place] [address], [place], [place] [address]          Radiology Studies: DG Chest 2 View  Result Date: 01/05/2021 CLINICAL DATA:  Shortness of breath. EXAM: CHEST - 2 VIEW COMPARISON:  PET CT 09/30/2020.  Chest x-ray 01/10/2019. FINDINGS: Mediastinum is stable. Persistent left hilar/perihilar density is again noted. Persistent atelectasis left lung base. Small left pleural effusion again noted. No pneumothorax. No acute bony abnormality identified. IMPRESSION: Persistent left hilar/perihilar density is again noted. Persistent atelectasis left lung base. Small left pleural effusion again noted. Similar findings noted on PET-CT. Reference is made to PET-CT of 09/30/2020. No new findings present. Electronically Signed   By: Marcello Moores  Register   On: 01/05/2021 10:17   CT Head Wo Contrast  Result Date: 01/05/2021 CLINICAL DATA:  Head trauma.  Abdominal pain.  Abnormal stool. EXAM: CT HEAD WITHOUT CONTRAST TECHNIQUE: Contiguous axial images were obtained from the base of the skull through the vertex without intravenous contrast. COMPARISON:  CT head without contrast 09/20/2018 FINDINGS: Brain: Moderate generalized atrophy is present. Minimal white matter changes are within normal limits for age. Atrophy is disc proportionate in the frontal lobes, similar the prior  exam. No acute infarct, hemorrhage, or mass lesion is present. The basal ganglia are within normal limits. No acute or focal cortical abnormality is present. The brainstem and cerebellum are within normal limits. Vascular: No hyperdense vessel or unexpected calcification. Skull: No significant extracranial soft tissue lesion is present. Calvarium is intact. No focal lytic or blastic lesions are present. Sinuses/Orbits: The paranasal sinuses and mastoid air cells are clear. The globes and orbits are within normal limits. IMPRESSION: 1. No acute intracranial abnormality or significant interval change. 2. Moderate generalized atrophy is proportionate in the frontal lobes, similar to the prior exam. This likely reflects the sequela of chronic microvascular ischemia. Electronically Signed   By: San Morelle M.D.   On: 01/05/2021 10:31   CT ABDOMEN PELVIS W CONTRAST  Result Date: 01/05/2021 CLINICAL DATA:  Abdominal distension. History  of alcoholism and cirrhosis and squamous cell carcinoma with Mets to the pancreas. EXAM: CT ABDOMEN AND PELVIS WITH CONTRAST TECHNIQUE: Multidetector CT imaging of the abdomen and pelvis was performed using the standard protocol following bolus administration of intravenous contrast. CONTRAST:  58mL OMNIPAQUE IOHEXOL 300 MG/ML  SOLN COMPARISON:  PET-CT dated September 30, 2020. FINDINGS: Lower chest: There is partial collapse of the left lower lobe, similar to prior study.The heart size is normal. Hepatobiliary: The liver is normal. Normal gallbladder.There is apparent ductal dilatation of the distal common bile duct. Pancreas: The previously demonstrated pancreatic mass appears to have substantially decreased in size from the prior study. An exact measurement is difficult to determine given respiratory motion artifact. There is an ill-defined masslike area adjacent to the pancreatic body and head measuring approximately 4.9 x 5.2 cm. Spleen: Unremarkable. Adrenals/Urinary Tract:  --Adrenal glands: Unremarkable. --Right kidney/ureter: No hydronephrosis or radiopaque kidney stones. --Left kidney/ureter: No hydronephrosis or radiopaque kidney stones. --Urinary bladder: Unremarkable. Stomach/Bowel: --Stomach/Duodenum: No hiatal hernia or other gastric abnormality. Normal duodenal course and caliber. --Small bowel: Unremarkable. --Colon: There are air-fluid levels throughout the colon. No evidence for an obstruction. --Appendix: Normal. Vascular/Lymphatic: Atherosclerotic calcification is present within the non-aneurysmal abdominal aorta, without hemodynamically significant stenosis. --No retroperitoneal lymphadenopathy. --No mesenteric lymphadenopathy. --No pelvic or inguinal lymphadenopathy. Reproductive: Unremarkable Other: There is a trace amount of free fluid in the upper abdomen. The abdominal wall is normal. Musculoskeletal. No acute displaced fractures. IMPRESSION: 1. There are air-fluid levels throughout the colon, which can be seen in patients with diarrhea. 2. The previously demonstrated pancreatic mass appears to have substantially decreased in size from the prior study. There is an ill-defined masslike area adjacent to the pancreatic body and head measuring approximately 4.9 x 5.2 cm. This could represent a metastatic lesion versus sequela of pancreatitis. 3. Persistent partial collapse of the left lower lobe. 4. Trace ascites. Aortic Atherosclerosis (ICD10-I70.0). Electronically Signed   By: Constance Holster M.D.   On: 01/05/2021 16:07        Scheduled Meds: . fluticasone furoate-vilanterol  1 puff Inhalation Daily   And  . umeclidinium bromide  1 puff Inhalation Daily  . folic acid  1 mg Oral Daily  . multivitamin with minerals  1 tablet Oral Daily  . pantoprazole (PROTONIX) IV  40 mg Intravenous Q12H  . sodium chloride flush  3 mL Intravenous Q12H  . thiamine  100 mg Oral Daily   Or  . thiamine  100 mg Intravenous Daily   Continuous Infusions: . sodium  chloride 20 mL/hr at 01/06/21 0533  . cefTRIAXone (ROCEPHIN)  IV 1 g (01/05/21 1829)  . potassium chloride 10 mEq (01/06/21 0535)     LOS: 1 day   Time spent: 8min  Trebor Galdamez C Wade Asebedo, DO [facility]  If 7PM-7AM, please contact night-coverage [email or web address]  01/06/2021, 7:50 AM

## 2021-01-06 NOTE — Anesthesia Postprocedure Evaluation (Signed)
Anesthesia Post Note  Patient: Ronald [name]  Procedure(s) Performed: ESOPHAGOGASTRODUODENOSCOPY (EGD) WITH PROPOFOL (N/A ) BIOPSY     Patient location during evaluation: PACU Anesthesia Type: MAC Level of consciousness: awake and alert Pain management: pain level controlled Vital Signs Assessment: post-procedure vital signs reviewed and stable Respiratory status: spontaneous breathing, nonlabored ventilation, respiratory function stable and patient connected to nasal cannula oxygen Cardiovascular status: stable and blood pressure returned to baseline Postop Assessment: no apparent nausea or vomiting Anesthetic complications: no   No complications documented.  Last Vitals:  Vitals:   01/06/21 1220 01/06/21 1230  BP: 118/69 (!) 147/59  Pulse: (!) 105 100  Resp: (!) 22 (!) 23  Temp:    SpO2: 98% 96%    Last Pain:  Vitals:   01/06/21 1230  TempSrc:   PainSc: 0-No pain                 Taryll Reichenberger S

## 2021-01-06 NOTE — Plan of Care (Signed)

## 2021-01-06 NOTE — Anesthesia Preprocedure Evaluation (Signed)
Anesthesia Evaluation  Patient identified by MRN, date of birth, ID band Patient awake  General Assessment Comment:squamous cell lung cancer with metastatic disease ( including pancreas)   Reviewed: Allergy & Precautions, NPO status , Patient's Chart, lab work & pertinent test results  Airway Mallampati: II  TM Distance: >3 FB Neck ROM: Full    Dental no notable dental hx.    Pulmonary COPD, former smoker,    Pulmonary exam normal breath sounds clear to auscultation       Cardiovascular hypertension, Normal cardiovascular exam Rhythm:Regular Rate:Normal     Neuro/Psych negative neurological ROS  negative psych ROS   GI/Hepatic (+) Cirrhosis     substance abuse  alcohol use, Hyponatremia and hypokalemia   Endo/Other  negative endocrine ROS  Renal/GU negative Renal ROS  negative genitourinary   Musculoskeletal negative musculoskeletal ROS (+)   Abdominal   Peds negative pediatric ROS (+)  Hematology  (+) anemia ,   Anesthesia Other Findings   Reproductive/Obstetrics negative OB ROS                             Anesthesia Physical Anesthesia Plan  ASA: IV  Anesthesia Plan: MAC   Post-op Pain Management:    Induction: Intravenous  PONV Risk Score and Plan: 1 and Propofol infusion and Treatment may vary due to age or medical condition  Airway Management Planned: Simple Face Mask  Additional Equipment:   Intra-op Plan:   Post-operative Plan:   Informed Consent: I have reviewed the patients History and Physical, chart, labs and discussed the procedure including the risks, benefits and alternatives for the proposed anesthesia with the patient or authorized representative who has indicated his/her understanding and acceptance.     Dental advisory given  Plan Discussed with: CRNA and Surgeon  Anesthesia Plan Comments:         Anesthesia Quick Evaluation

## 2021-01-06 NOTE — Op Note (Signed)
[facility] Patient Name: Ronald [name] Procedure Date: 01/06/2021 MRN: [id number] Attending MD: Milus Banister , MD Date of Birth: [birth year] CSN: [id number] Age: 65 Admit Type: Inpatient Procedure:                Upper GI endoscopy Indications:              Melena (metastatic squamous cell cancer of the                            lung, recently proven metastatic squamous cell                            tumor in the head of pancreas) Providers:                Milus Banister, MD, Doristine Johns, RN, Lesia Sago, Technician Referring MD:              Medicines:                Monitored Anesthesia Care Complications:            No immediate complications. Estimated blood loss:                            None. Estimated Blood Loss:     Estimated blood loss: none. Procedure:                After obtaining informed consent, the endoscope was                            passed under direct vision. Throughout the                            procedure, the patient's blood pressure, pulse, and                            oxygen saturations were monitored continuously. The                            [id number]-[id number] ([id number]) was introduced through the                            mouth, and advanced to the second part of duodenum.                            The Olympus [id number]) was introduced                            through the and advanced to the. The upper GI                            endoscopy was accomplished without difficulty. The  patient tolerated the procedure well. Scope In: Scope Out: Findings:      GE junction was slightly snug but the mucosa was normal throughout the       esophagus.      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      No esophageal or gastric varices.      The duodenal mucosa just proximal to the major papilla orifice was       unusual appearing for  about 3cm (friable, edematous, somewhat papillary       appearing). This was best seen with side viewing duodenoscopy. It was       not clearly neoplastic but with the known squamous cell metastatic mass       directly deep to this mucosa in the head of the pancreas I took       extensive mucosal biospies.      The exam was otherwise without abnormality. Impression:               - GE junction was slightly snug but the mucosa was                            normal throughout the esophagus.                           - Spontaneously friable portal gastropathy changes                            throughout the stomach.                           - No esophageal or gastric varices.                           - The duodenal mucosa just proximal to the major                            papilla orifice was unusual appearing for about 3cm                            (friable, edematous, somewhat papillary appearing).                            This was best seen with side viewing duodenoscopy.                            It was not clearly neoplastic but with the known                            squamous cell metastatic mass directly deep to this                            mucosa in the head of the pancreas I took extensive                            mucosal biospies. Moderate Sedation:      Not Applicable - Patient had care per  Anesthesia. Recommendation:           - Unclear if any of the above findings explain his                            dark stools, anemia. I discussed eventual                            colonoscopy with him because he has never had one                            but he quite adamantly declined, even if the                            eventual biopsy results are non-diagnostic.                           - I will allow him to eat.                           - Follow blood counts serially (CBC in AM). Procedure Code(s):        --- Professional ---                           [phone],  Esophagogastroduodenoscopy, flexible,                            transoral; with biopsy, single or multiple Diagnosis Code(s):        --- Professional ---                           K31.89, Other diseases of stomach and duodenum                           K92.1, Melena (includes Hematochezia) CPT copyright 2019 American Medical Association. All rights reserved. The codes documented in this report are preliminary and upon coder review may  be revised to meet current compliance requirements. Milus Banister, MD 01/06/2021 12:24:53 PM This report has been signed electronically. Number of Addenda: 0

## 2021-01-06 NOTE — Progress Notes (Signed)
Initial Nutrition Assessment  DOCUMENTATION CODES:   Not applicable  INTERVENTION:  - diet advancement as medically feasible. - will provide appropriate interventions with diet advancement. - complete NFPE when feasible.  Once diet advanced, monitor magnesium, potassium, and phosphorus daily for at least 3 days, MD to replete as needed, as pt is at risk for refeeding syndrome given documented hx of alcohol abuse, significant weight loss in the past 2 weeks.   NUTRITION DIAGNOSIS:   Increased nutrient needs related to acute illness,chronic illness,cancer and cancer related treatments as evidenced by estimated needs.  GOAL:   Patient will meet greater than or equal to 90% of their needs  MONITOR:   Diet advancement,Labs,Weight trends  REASON FOR ASSESSMENT:   Malnutrition Screening Tool  ASSESSMENT:   65 year old male with medical history of alcoholism, cirrhosis, iron deficiency, anemia, COPD, and squamous cell carcinoma/NSCLC with mets to pancreas on Taxol/carboplatin/pembrolizumab with most last chemo on 2/25. He presented to the ED due to lower abdominal pain, blood stools x2 days, decreased/poor PO intake, falls, and recurrent syncope which patient reported would occur after BMs.  Patient has been NPO since admission. He was sleeping at the time of attempted visit late morning and no family or visitors present. Patient now out of the room to Endoscopy.   He has not been seen by a [place] RD since 09/23/18.   Weight yesterday was 145 lb, weight on 2/25 was 152 lb, and weight on 2/4 was 154 lb. Weight had been stable 10/12/20-12/25/19. This indicates 7 lb weight loss (4.5% body weight) in the past 2 weeks; significant for time frame.   No information documented in the edema section of flow sheet.  Per notes: - symptomatic anemia with concern for GIB - recurrent syncope in the setting of symptomatic anemia - NSCLC with pancreatic mets - alcohol abuse   Labs  reviewed; Na: 127 mmol/l, K: 2.6 mmol/l, Cl: 92 mmol/l, Ca: 8.3 mg/dl, Mg: 1.4 mg/dl. Medications reviewed; 1 mg folvite/day, 4 g IV Mg sulfate x1 run 3/10, 1 tablet multivitamin with minerals/day, 40 mg IV protonix BID, 10 mEq IV KCl x3 runs 3/10, 100 mg thiamine/day.     NUTRITION - FOCUSED PHYSICAL EXAM:  unable to complete at this time.   Diet Order:   Diet Order            Diet NPO time specified  Diet effective now                 EDUCATION NEEDS:   Not appropriate for education at this time  Skin:  Skin Assessment: Reviewed RN Assessment  Last BM:  PTA/unknown  Height:   Ht Readings from Last 1 Encounters:  01/05/21 5\' 4"  (1.626 m)    Weight:   Wt Readings from Last 1 Encounters:  01/05/21 65.8 kg     Estimated Nutritional Needs:  Kcal:  2000-2200 kcal Protein:  100-115 grams Fluid:  >/= 2 L/day      Jarome Matin, MS, RD, LDN, CNSC Inpatient Clinical Dietitian RD pager # available in [place]  After hours/weekend pager # available in [facility]

## 2021-01-06 NOTE — Progress Notes (Signed)
   01/06/21 1216  Provider Notification  Provider Name/Title Sharion Settler  Date Provider Notified 01/06/21  Time Provider Notified [phone]  Notification Type Page  Notification Reason Critical result  Test performed and critical result Potassium 2.6  Date Critical Result Received 01/06/21  Time Critical Result Received 9507  Provider response See new orders  Date of Provider Response 01/06/21  Time of Provider Response [phone]

## 2021-01-07 ENCOUNTER — Encounter (HOSPITAL_COMMUNITY): Payer: Self-pay | Admitting: Gastroenterology

## 2021-01-07 LAB — CBC
HCT: 26.5 % — ABNORMAL LOW (ref 39.0–52.0)
Hemoglobin: 9 g/dL — ABNORMAL LOW (ref 13.0–17.0)
MCH: 33 pg (ref 26.0–34.0)
MCHC: 34 g/dL (ref 30.0–36.0)
MCV: 97.1 fL (ref 80.0–100.0)
Platelets: 135 10*3/uL — ABNORMAL LOW (ref 150–400)
RBC: 2.73 MIL/uL — ABNORMAL LOW (ref 4.22–5.81)
RDW: 15.4 % (ref 11.5–15.5)
WBC: 6 10*3/uL (ref 4.0–10.5)
nRBC: 0 % (ref 0.0–0.2)

## 2021-01-07 LAB — BASIC METABOLIC PANEL
Anion gap: 12 (ref 5–15)
BUN: 7 mg/dL — ABNORMAL LOW (ref 8–23)
CO2: 22 mmol/L (ref 22–32)
Calcium: 8.2 mg/dL — ABNORMAL LOW (ref 8.9–10.3)
Chloride: 95 mmol/L — ABNORMAL LOW (ref 98–111)
Creatinine, Ser: 0.7 mg/dL (ref 0.61–1.24)
GFR, Estimated: >60 mL/min (ref >60)
Glucose, Bld: 106 mg/dL — ABNORMAL HIGH (ref 70–99)
Potassium: 2.7 mmol/L — CL (ref 3.5–5.1)
Sodium: 129 mmol/L — ABNORMAL LOW (ref 135–145)

## 2021-01-07 LAB — SURGICAL PATHOLOGY

## 2021-01-07 MED ORDER — POTASSIUM CHLORIDE CRYS ER 20 MEQ PO TBCR: 20.0000 meq | EXTENDED_RELEASE_TABLET | Freq: Two times a day (BID) | ORAL | 0 refills | Status: DC

## 2021-01-07 MED ORDER — POTASSIUM CHLORIDE 10 MEQ/100ML IV SOLN
10.0000 meq | INTRAVENOUS | Status: AC
  Administered 2021-01-07: 10 meq via INTRAVENOUS
  Filled 2021-01-07: qty 100

## 2021-01-07 MED ORDER — POTASSIUM CHLORIDE CRYS ER 20 MEQ PO TBCR
40.0000 meq | EXTENDED_RELEASE_TABLET | ORAL | Status: AC
  Administered 2021-01-07 (×3): 40 meq via ORAL
  Filled 2021-01-07 (×3): qty 2

## 2021-01-07 NOTE — Progress Notes (Signed)
Awaiting biopsies. Will discontinue Rocephin for  SBP prophylaxis since only trace ascites on CT scan.

## 2021-01-07 NOTE — Care Management Important Message (Signed)
Important Message  Patient Details IM Letter given to the Patient. Name: Ronald [name] MRN: [id number] Date of Birth: 07-28-1956   Medicare Important Message Given:  Yes     Kerin Salen 01/07/2021, 10:55 AM

## 2021-01-07 NOTE — Progress Notes (Signed)
   01/07/21 3794  Provider Notification  Provider Name/Title Lovey Newcomer NP  Date Provider Notified 01/07/21  Time Provider Notified 0530  Notification Type Page  Notification Reason Critical result  Test performed and critical result Potassium > 2.7  Date Critical Result Received 01/07/21  Time Critical Result Received 0525  Provider response See new orders  Date of Provider Response 01/07/21  Time of Provider Response [phone]

## 2021-01-07 NOTE — Discharge Summary (Signed)
Physician Discharge Summary  Ronald [name] FWY:[id number] DOB: [birth year] DOA: 01/05/2021  PCP: [facility] date: 01/05/2021 Discharge date: 01/07/2021  Admitted From: Home Disposition: Home  Recommendations for Outpatient Follow-up:  1. Follow up with PCP in 1-2 weeks 2. Please obtain BMP/CBC in one week 3. 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 intake, falls and syncope. On 3/7 the patient's sister called his oncology office with new onset syncope and loss of bowel control, black stools and bilateral lower extremity weakness and he was advised to go to the ED however the patient had refused and so did not go. EMS was called to the home on 3/7 due to 102.5 F fever, tachycardia 123 bpm with the syncopal episode. Also with a cough and black stools. This prompted him to come to the ED, patient admitted for further work-up for possible upper GI bleed, GI consulted at intake.  Patient met as above with acute dark stools and profound anemia.  Upper endoscopy was unremarkable for any bleeding, there was atypical friable tissue biopsies currently pending, patient has refused any further work-up imaging or colonoscopy to further evaluate for possible bleeding.  Patient has had no melena since admission, hemoglobin and potassium continue to remain low but somewhat stable.  Recommend very close follow-up with PCP and oncology early next  week for repeat labs, will increase patient's potassium at discharge home per his request given he no longer wants to undergo any further imaging labs or intervention in the inpatient setting.  We discussed that he may still continue to bleed that this does increase his risk of morbidity mortality, patient states "I want to focus on my chemotherapy with Ronald [name]" which we reminded him that profound anemia or ongoing GI bleed would likely limit his chemotherapy options if he were to become symptomatic or profoundly anemic again.  He again disagreed to undergo any further evaluation in the hospital and otherwise requested discharge home.  Potassium increased to 20 twice daily in setting of GI losses, follow-up closely within 3 days for repeat labs to follow hemoglobin and potassium.  Discharge Diagnoses:  Active Problems:   Symptomatic anemia   GI bleed   Syncope    Discharge Instructions  Discharge Instructions    Call MD for:  difficulty breathing, headache or visual disturbances   Complete by: As directed    Call MD for:  persistant nausea and vomiting   Complete by: As directed    Call MD for:  severe uncontrolled pain   Complete by: As directed    Diet - low sodium heart healthy   Complete by: As directed    Increase activity slowly   Complete by: As directed      Allergies as of 01/07/2021   No Known Allergies     Medication List    TAKE these medications   albuterol (2.5 MG/3ML) 0.083% nebulizer solution Commonly known as: PROVENTIL TAKE 3ML BY MOUTH VIA NEBULIZER EVERY 4 HOURS AS NEEDED FOR WHEEZING OR SHORTNESS OF BREATH  What changed:   how much to take  how to take this  when to take this  reasons to take this  additional instructions   albuterol 108 (90 Base) MCG/ACT inhaler Commonly known as: Ventolin HFA INHALE 2 PUFFS BY MOUTH EVERY 6 HOURS AS NEEDED FOR SHORTNESS OF BREATH What changed:   how much to take  how to take this  when to take  this  reasons to take this  additional instructions   ascorbic acid 500 MG tablet Commonly known as: VITAMIN C Take 500 mg by mouth daily.   cholecalciferol 25 MCG (1000 UNIT) tablet Commonly known as: VITAMIN D3 Take 1,000 Units by mouth daily.   feeding supplement Liqd Take 237 mLs by mouth 2 (two) times daily between meals. What changed:   when to take this  reasons to take this   fexofenadine 180 MG tablet Commonly known as: ALLEGRA Take 180 mg by mouth daily.   mirtazapine 7.5 MG tablet Commonly known as: REMERON Take 1 tablet (7.5 mg total) by mouth at bedtime.   potassium chloride SA 20 MEQ tablet Commonly known as: KLOR-CON Take 1 tablet (20 mEq total) by mouth 2 (two) times daily. What changed: when to take this   Trelegy Ellipta 100-62.5-25 MCG/INH Aepb Generic drug: Fluticasone-Umeclidin-Vilant Inhale 1 puff into the lungs daily.       No Known Allergies  Consultations:  Oncology, GI   Procedures/Studies: DG Chest 2 View  Result Date: 01/05/2021 CLINICAL DATA:  Shortness of breath. EXAM: CHEST - 2 VIEW COMPARISON:  PET CT 09/30/2020.  Chest x-ray 01/10/2019. FINDINGS: Mediastinum is stable. Persistent left hilar/perihilar density is again noted. Persistent atelectasis left lung base. Small left pleural effusion again noted. No pneumothorax. No acute bony abnormality identified. IMPRESSION: Persistent left hilar/perihilar density is again noted. Persistent atelectasis left lung base. Small left pleural effusion again noted. Similar findings noted on PET-CT. Reference is made to PET-CT of 09/30/2020. No new findings present. Electronically Signed   By: Marcello Moores  Register   On: 01/05/2021 10:17   CT Head Wo Contrast  Result Date: 01/05/2021 CLINICAL DATA:  Head trauma.  Abdominal pain.  Abnormal stool. EXAM: CT HEAD WITHOUT CONTRAST TECHNIQUE: Contiguous axial images were obtained from the base of the skull through the vertex without intravenous contrast.  COMPARISON:  CT head without contrast 09/20/2018 FINDINGS: Brain: Moderate generalized atrophy is present. Minimal white matter changes are within normal limits for age. Atrophy is disc proportionate in the frontal lobes, similar the prior exam. No acute infarct, hemorrhage, or mass lesion is present. The basal ganglia are within normal limits. No acute or focal cortical abnormality is present. The brainstem and cerebellum are within normal limits. Vascular: No hyperdense vessel or unexpected calcification. Skull: No significant extracranial soft tissue lesion is present. Calvarium is intact. No focal lytic or blastic lesions are present. Sinuses/Orbits: The paranasal sinuses and mastoid air cells are clear. The globes and orbits are within normal limits. IMPRESSION: 1. No acute intracranial abnormality or significant interval change. 2. Moderate generalized atrophy is proportionate in the frontal lobes, similar to the prior exam. This likely reflects the sequela of chronic microvascular ischemia. Electronically Signed   By: San Morelle M.D.   On: 01/05/2021 10:31   CT ABDOMEN PELVIS W CONTRAST  Result Date: 01/05/2021 CLINICAL DATA:  Abdominal distension. History of alcoholism and cirrhosis and squamous cell carcinoma with Mets to the pancreas. EXAM: CT ABDOMEN AND PELVIS WITH CONTRAST TECHNIQUE: Multidetector CT imaging of the  abdomen and pelvis was performed using the standard protocol following bolus administration of intravenous contrast. CONTRAST:  62m OMNIPAQUE IOHEXOL 300 MG/ML  SOLN COMPARISON:  PET-CT dated September 30, 2020. FINDINGS: Lower chest: There is partial collapse of the left lower lobe, similar to prior study.The heart size is normal. Hepatobiliary: The liver is normal. Normal gallbladder.There is apparent ductal dilatation of the distal common bile duct. Pancreas: The previously demonstrated pancreatic mass appears to have substantially decreased in size from the prior study. An  exact measurement is difficult to determine given respiratory motion artifact. There is an ill-defined masslike area adjacent to the pancreatic body and head measuring approximately 4.9 x 5.2 cm. Spleen: Unremarkable. Adrenals/Urinary Tract: --Adrenal glands: Unremarkable. --Right kidney/ureter: No hydronephrosis or radiopaque kidney stones. --Left kidney/ureter: No hydronephrosis or radiopaque kidney stones. --Urinary bladder: Unremarkable. Stomach/Bowel: --Stomach/Duodenum: No hiatal hernia or other gastric abnormality. Normal duodenal course and caliber. --Small bowel: Unremarkable. --Colon: There are air-fluid levels throughout the colon. No evidence for an obstruction. --Appendix: Normal. Vascular/Lymphatic: Atherosclerotic calcification is present within the non-aneurysmal abdominal aorta, without hemodynamically significant stenosis. --No retroperitoneal lymphadenopathy. --No mesenteric lymphadenopathy. --No pelvic or inguinal lymphadenopathy. Reproductive: Unremarkable Other: There is a trace amount of free fluid in the upper abdomen. The abdominal wall is normal. Musculoskeletal. No acute displaced fractures. IMPRESSION: 1. There are air-fluid levels throughout the colon, which can be seen in patients with diarrhea. 2. The previously demonstrated pancreatic mass appears to have substantially decreased in size from the prior study. There is an ill-defined masslike area adjacent to the pancreatic body and head measuring approximately 4.9 x 5.2 cm. This could represent a metastatic lesion versus sequela of pancreatitis. 3. Persistent partial collapse of the left lower lobe. 4. Trace ascites. Aortic Atherosclerosis (ICD10-I70.0). Electronically Signed   By: CConstance HolsterM.D.   On: 01/05/2021 16:07      Subjective: No acute issues or events overnight denies any further episodes of melena or hematochezia.  Otherwise requesting discharge home denies chest pain shortness of breath nausea vomiting  diarrhea constipation headache fevers chills.   Discharge Exam: Vitals:   01/06/21 2100 01/07/21 0532  BP: 124/88 114/88  Pulse: 89 90  Resp:    Temp: 98 F (36.7 C) 98.6 F (37 C)  SpO2: 100% 96%   Vitals:   01/06/21 1303 01/06/21 1422 01/06/21 2100 01/07/21 0532  BP: 123/67  124/88 114/88  Pulse: 93  89 90  Resp: 18     Temp:  98.3 F (36.8 C) 98 F (36.7 C) 98.6 F (37 C)  TempSrc:  Oral Oral Oral  SpO2: 100%  100% 96%  Weight:      Height:        General: Pt is alert, awake, not in acute distress Cardiovascular: RRR, S1/S2 +, no rubs, no gallops Respiratory: CTA bilaterally, no wheezing, no rhonchi Abdominal: Soft, NT, ND, bowel sounds + Extremities: no edema, no cyanosis    The results of significant diagnostics from this hospitalization (including imaging, microbiology, ancillary and laboratory) are listed below for reference.     Microbiology: Recent Results (from the past 240 hour(s))  Blood culture (routine x 2)     Status: None (Preliminary result)   Collection Time: 01/05/21  9:50 AM   Specimen: BLOOD  Result Value Ref Range Status   Specimen Description   Final    BLOOD RIGHT ARM Performed at [id number] [place] [address]   Special Requests  Final    BOTTLES DRAWN AEROBIC AND ANAEROBIC Blood Culture adequate volume Performed at [facility] [address] [place], [place] [address]    Culture   Final    NO GROWTH 1 DAY Performed at [facility], [place] [address], [place], [place] [address]    Report Status PENDING  Incomplete  Resp Panel by RT-PCR (Flu A&B, Covid) Nasopharyngeal Swab     Status: None   Collection Time: 01/05/21  9:50 AM   Specimen: Nasopharyngeal Swab; Nasopharyngeal(NP) swabs in vial transport medium  Result Value Ref Range Status   SARS Coronavirus 2 by RT PCR NEGATIVE NEGATIVE Final    Comment: (NOTE) SARS-CoV-2 target nucleic acids are NOT  DETECTED.  The SARS-CoV-2 RNA is generally detectable in upper respiratory specimens during the acute phase of infection. The lowest concentration of SARS-CoV-2 viral copies this assay can detect is 138 copies/mL. A negative result does not preclude SARS-Cov-2 infection and should not be used as the sole basis for treatment or other patient management decisions. A negative result may occur with  improper specimen collection/handling, submission of specimen other than nasopharyngeal swab, presence of viral mutation(s) within the areas targeted by this assay, and inadequate number of viral copies(<138 copies/mL). A negative result must be combined with clinical observations, patient history, and epidemiological information. The expected result is Negative.  Fact Sheet for Patients:  EntrepreneurPulse.com.au  Fact Sheet for Healthcare Providers:  IncredibleEmployment.be  This test is no t yet approved or cleared by the [place] FDA and  has been authorized for detection and/or diagnosis of SARS-CoV-2 by FDA under an Emergency Use Authorization (EUA). This EUA will remain  in effect (meaning this test can be used) for the duration of the COVID-19 declaration under Section 564(b)(1) of the Act, 21 U.S.C.section 360bbb-3(b)(1), unless the authorization is terminated  or revoked sooner.       Influenza A by PCR NEGATIVE NEGATIVE Final   Influenza B by PCR NEGATIVE NEGATIVE Final    Comment: (NOTE) The Xpert Xpress SARS-CoV-2/FLU/RSV plus assay is intended as an aid in the diagnosis of influenza from Nasopharyngeal swab specimens and should not be used as a sole basis for treatment. Nasal washings and aspirates are unacceptable for Xpert Xpress SARS-CoV-2/FLU/RSV testing.  Fact Sheet for Patients: EntrepreneurPulse.com.au  Fact Sheet for Healthcare Providers: IncredibleEmployment.be  This test is not yet  approved or cleared by the [place] FDA and has been authorized for detection and/or diagnosis of SARS-CoV-2 by FDA under an Emergency Use Authorization (EUA). This EUA will remain in effect (meaning this test can be used) for the duration of the COVID-19 declaration under Section 564(b)(1) of the Act, 21 U.S.C. section 360bbb-3(b)(1), unless the authorization is terminated or revoked.  Performed at [facility], [place] [address], [place], [place] [address]   Blood culture (routine x 2)     Status: None (Preliminary result)   Collection Time: 01/05/21  9:55 AM   Specimen: BLOOD RIGHT HAND  Result Value Ref Range Status   Specimen Description   Final    BLOOD RIGHT HAND Performed at [facility] [address] [place], [place] [address]    Special Requests   Final    BOTTLES DRAWN AEROBIC AND ANAEROBIC Blood Culture adequate volume Performed at [facility] [address], [place], [place] [address]    Culture   Final    NO GROWTH 1 DAY Performed at [facility], [address], [place], [place] [address]    Report Status PENDING  Incomplete     Labs: BNP (last 3 results) No results for input(s): BNP in the last 8760 hours. Basic Metabolic Panel: Recent Labs  Lab 01/05/21 0950 01/06/21 0440 01/07/21 0447  NA 122* 127* 129*  K 3.3* 2.6* 2.7*  CL 85* 92* 95*  CO2 '22 23 22  ' GLUCOSE 125* 95 106*  BUN 9 8 7*  CREATININE 0.65 0.85 0.70  CALCIUM 8.3* 8.3* 8.2*  MG 1.5* 1.4*  --   PHOS 2.8  --   --    Liver Function Tests: Recent Labs  Lab 01/05/21 0950  AST 72*  ALT 25  ALKPHOS 87  BILITOT 1.4*  PROT 6.2*  ALBUMIN 2.7*   Recent Labs  Lab 01/05/21 0950  LIPASE 111*   No results for input(s): AMMONIA in the last 168 hours. CBC: Recent Labs  Lab 01/05/21 0916 01/05/21 1230 01/05/21 1620 01/06/21 0440 01/07/21 0447  WBC 5.0  --   --  5.4 6.0  NEUTROABS 3.3  --   --   --   --   HGB  9.3* 7.5* 7.8* 9.8* 9.0*  HCT 25.9* 21.7* 22.6* 26.8* 26.5*  MCV 95.2  --   --  93.1 97.1  PLT 144*  --   --  121* 135*   Cardiac Enzymes: No results for input(s): CKTOTAL, CKMB, CKMBINDEX, TROPONINI in the last 168 hours. BNP: Invalid input(s): POCBNP CBG: No results for input(s): GLUCAP in the last 168 hours. D-Dimer No results for input(s): DDIMER in the last 72 hours. Hgb A1c No results for input(s): HGBA1C in the last 72 hours. Lipid Profile No results for input(s): CHOL, HDL, LDLCALC, TRIG, CHOLHDL, LDLDIRECT in the last 72 hours. Thyroid function studies No results for input(s): TSH, T4TOTAL, T3FREE, THYROIDAB in the last 72 hours.  Invalid input(s): FREET3 Anemia work up No results for input(s): VITAMINB12, FOLATE, FERRITIN, TIBC, IRON, RETICCTPCT in the last 72 hours. Urinalysis    Component Value Date/Time   COLORURINE AMBER (A) 09/19/2018 1640   APPEARANCEUR HAZY (A) 09/19/2018 1640   LABSPEC 1.020 09/19/2018 1640   PHURINE 6.0 09/19/2018 1640   GLUCOSEU NEGATIVE 09/19/2018 1640   HGBUR NEGATIVE 09/19/2018 1640   BILIRUBINUR SMALL (A) 09/19/2018 1640   KETONESUR NEGATIVE 09/19/2018 1640   PROTEINUR 30 (A) 09/19/2018 1640   UROBILINOGEN 0.2 09/30/2014 1958   NITRITE NEGATIVE 09/19/2018 1640   LEUKOCYTESUR NEGATIVE 09/19/2018 1640   Sepsis Labs Invalid input(s): PROCALCITONIN,  WBC,  LACTICIDVEN Microbiology Recent Results (from the past 240 hour(s))  Blood culture (routine x 2)     Status: None (Preliminary result)   Collection Time: 01/05/21  9:50 AM   Specimen: BLOOD  Result Value Ref Range Status   Specimen Description   Final    BLOOD RIGHT ARM Performed at [facility], [place] [address], [place], [place] [address]    Special Requests   Final    BOTTLES DRAWN AEROBIC AND ANAEROBIC Blood Culture adequate volume Performed at [facility] [address] [place], [place] [address]    Culture   Final    NO GROWTH 1  DAY Performed at [facility], [place] [address] [place], [place] [address]    Report Status PENDING  Incomplete  Resp Panel by RT-PCR (Flu A&B, Covid) Nasopharyngeal Swab     Status: None   Collection Time: 01/05/21  9:50 AM   Specimen:  Nasopharyngeal Swab; Nasopharyngeal(NP) swabs in vial transport medium  Result Value Ref Range Status   SARS Coronavirus 2 by RT PCR NEGATIVE NEGATIVE Final    Comment: (NOTE) SARS-CoV-2 target nucleic acids are NOT DETECTED.  The SARS-CoV-2 RNA is generally detectable in upper respiratory specimens during the acute phase of infection. The lowest concentration of SARS-CoV-2 viral copies this assay can detect is 138 copies/mL. A negative result does not preclude SARS-Cov-2 infection and should not be used as the sole basis for treatment or other patient management decisions. A negative result may occur with  improper specimen collection/handling, submission of specimen other than nasopharyngeal swab, presence of viral mutation(s) within the areas targeted by this assay, and inadequate number of viral copies(<138 copies/mL). A negative result must be combined with clinical observations, patient history, and epidemiological information. The expected result is Negative.  Fact Sheet for Patients:  EntrepreneurPulse.com.au  Fact Sheet for Healthcare Providers:  IncredibleEmployment.be  This test is no t yet approved or cleared by the [place] FDA and  has been authorized for detection and/or diagnosis of SARS-CoV-2 by FDA under an Emergency Use Authorization (EUA). This EUA will remain  in effect (meaning this test can be used) for the duration of the COVID-19 declaration under Section 564(b)(1) of the Act, 21 U.S.C.section 360bbb-3(b)(1), unless the authorization is terminated  or revoked sooner.       Influenza A by PCR NEGATIVE NEGATIVE Final   Influenza B by PCR NEGATIVE NEGATIVE Final    Comment:  (NOTE) The Xpert Xpress SARS-CoV-2/FLU/RSV plus assay is intended as an aid in the diagnosis of influenza from Nasopharyngeal swab specimens and should not be used as a sole basis for treatment. Nasal washings and aspirates are unacceptable for Xpert Xpress SARS-CoV-2/FLU/RSV testing.  Fact Sheet for Patients: EntrepreneurPulse.com.au  Fact Sheet for Healthcare Providers: IncredibleEmployment.be  This test is not yet approved or cleared by the [place] FDA and has been authorized for detection and/or diagnosis of SARS-CoV-2 by FDA under an Emergency Use Authorization (EUA). This EUA will remain in effect (meaning this test can be used) for the duration of the COVID-19 declaration under Section 564(b)(1) of the Act, 21 U.S.C. section 360bbb-3(b)(1), unless the authorization is terminated or revoked.  Performed at [facility], [place] [address] [place], [place] [address]   Blood culture (routine x 2)     Status: None (Preliminary result)   Collection Time: 01/05/21  9:55 AM   Specimen: BLOOD RIGHT HAND  Result Value Ref Range Status   Specimen Description   Final    BLOOD RIGHT HAND Performed at [place] [facility] [address] [place], [place] [address]    Special Requests   Final    BOTTLES DRAWN AEROBIC AND ANAEROBIC Blood Culture adequate volume Performed at [facility] [address], [place], [place] [address]    Culture   Final    NO GROWTH 1 DAY Performed at [facility], [place] [address] [place], [place] [address]    Report Status PENDING  Incomplete     Time coordinating discharge: Over 30 minutes  SIGNED:   Little Ishikawa, DO [facility] 01/07/2021, 10:29 AM Pager   If 7PM-7AM, please contact night-coverage [email or web address]

## 2021-01-09 ENCOUNTER — Other Ambulatory Visit: Payer: Self-pay | Admitting: Oncology

## 2021-01-10 ENCOUNTER — Telehealth: Payer: Self-pay

## 2021-01-10 LAB — CULTURE, BLOOD (ROUTINE X 2)
Culture: NO GROWTH
Culture: NO GROWTH
Special Requests: ADEQUATE
Special Requests: ADEQUATE

## 2021-01-10 NOTE — Telephone Encounter (Signed)
Please schedule ov for pt.

## 2021-01-10 NOTE — Telephone Encounter (Signed)
Pt's sister called. Pt was admitted at [facility] and discharged on Friday 01/07/21. Per pts sister, pt will need an TCS very soon. Pt is loosing blood and a TCS wasn't able to be completed due to pt eating per pts sister. Please advise if pt needs an apt.

## 2021-01-10 NOTE — Telephone Encounter (Signed)
Left a detailed message for pts sister with apt time, address and date. If they have any questions, they can contact our office back .

## 2021-01-10 NOTE — Telephone Encounter (Signed)
LMOM OV 3/16 with Dr Abbey Chatters

## 2021-01-10 NOTE — Telephone Encounter (Signed)
Yes, since pt is having problems, he definitely needs an office visit.

## 2021-01-12 ENCOUNTER — Ambulatory Visit (INDEPENDENT_AMBULATORY_CARE_PROVIDER_SITE_OTHER): Payer: Medicare Other | Admitting: Internal Medicine

## 2021-01-12 ENCOUNTER — Other Ambulatory Visit: Payer: Self-pay

## 2021-01-12 ENCOUNTER — Other Ambulatory Visit: Payer: Self-pay | Admitting: *Deleted

## 2021-01-12 ENCOUNTER — Encounter: Payer: Self-pay | Admitting: Internal Medicine

## 2021-01-12 VITALS — BP 110/64 | HR 115 | Temp 97.7°F | Ht 64.0 in | Wt 152.0 lb

## 2021-01-12 DIAGNOSIS — K7030 Alcoholic cirrhosis of liver without ascites: Secondary | ICD-10-CM | POA: Diagnosis not present

## 2021-01-12 DIAGNOSIS — E876 Hypokalemia: Secondary | ICD-10-CM

## 2021-01-12 DIAGNOSIS — K7460 Unspecified cirrhosis of liver: Secondary | ICD-10-CM | POA: Insufficient documentation

## 2021-01-12 DIAGNOSIS — D649 Anemia, unspecified: Secondary | ICD-10-CM | POA: Diagnosis not present

## 2021-01-12 MED ORDER — DIPHENOXYLATE-ATROPINE 2.5-0.025 MG PO TABS: 1.0000 | ORAL_TABLET | Freq: Four times a day (QID) | ORAL | 0 refills | Status: DC | PRN

## 2021-01-12 NOTE — Progress Notes (Signed)
Called in script for lomotil per Dr. Benay Spice.

## 2021-01-12 NOTE — Patient Instructions (Signed)
I am going to check blood counts today as well as your potassium levels.  We will schedule you for urgent colonoscopy to evaluate your worsening anemia.  We will discuss your cirrhosis further on follow-up visit.  I am going to do some basic liver test today.  At [facility] we value your feedback. You may receive a survey about your visit today. Please share your experience as we strive to create trusting relationships with our patients to provide genuine, compassionate, quality care.  We appreciate your understanding and patience as we review any laboratory studies, imaging, and other diagnostic tests that are ordered as we care for you. Our office policy is 5 business days for review of these results, and any emergent or urgent results are addressed in a timely manner for your best interest. If you do not hear from our office in 1 week, please contact us.   We also encourage the use of MyChart, which contains your medical information for your review as well. If you are not enrolled in this feature, an access code is on this after visit summary for your convenience. Thank you for allowing [place] to be involved in your care.  It was great to see you today!  I hope you have a great rest of your spring!!    Ronald [name]. Abbey Chatters, D.O. Gastroenterology and Hepatology [facility]

## 2021-01-13 ENCOUNTER — Telehealth: Payer: Self-pay

## 2021-01-13 ENCOUNTER — Other Ambulatory Visit: Payer: Self-pay

## 2021-01-13 MED ORDER — PEG 3350-KCL-NA BICARB-NACL 420 G PO SOLR: 4000.0000 mL | ORAL | 0 refills | Status: DC

## 2021-01-13 MED FILL — Fosaprepitant Dimeglumine For IV Infusion 150 MG (Base Eq): INTRAVENOUS | Qty: 5 | Status: AC

## 2021-01-13 MED FILL — Dexamethasone Sodium Phosphate Inj 100 MG/10ML: INTRAMUSCULAR | Qty: 1 | Status: AC

## 2021-01-13 NOTE — Telephone Encounter (Signed)
Pre-op/covid test 02/04/21 at 2:00pm. Appt letter mailed with procedure instructions.

## 2021-01-13 NOTE — Telephone Encounter (Signed)
Called sister [name]), TCS ASA 3/4 w/Dr. Abbey Chatters scheduled for 02/08/21--PM. Pt added to cancellation list per Dr. Tildon Husky aware. Rx for prep sent to pharmacy. Orders entered.

## 2021-01-14 ENCOUNTER — Inpatient Hospital Stay: Payer: Medicare Other | Admitting: Nurse Practitioner

## 2021-01-14 ENCOUNTER — Inpatient Hospital Stay: Payer: Medicare Other

## 2021-01-14 ENCOUNTER — Telehealth: Payer: Self-pay

## 2021-01-14 ENCOUNTER — Inpatient Hospital Stay: Payer: Medicare Other | Attending: Oncology

## 2021-01-14 NOTE — Telephone Encounter (Signed)
Called left message to inquire about missed appt this day encouraged pt to return call scheduling message sent to reschedule

## 2021-01-15 LAB — COMPREHENSIVE METABOLIC PANEL
AG Ratio: 1.1 (calc) (ref 1.0–2.5)
ALT: 12 U/L (ref 9–46)
AST: 19 U/L (ref 10–35)
Albumin: 3.1 g/dL — ABNORMAL LOW (ref 3.6–5.1)
Alkaline phosphatase (APISO): 102 U/L (ref 35–144)
BUN/Creatinine Ratio: 16 (calc) (ref 6–22)
BUN: 9 mg/dL (ref 7–25)
CO2: 23 mmol/L (ref 20–32)
Calcium: 8.7 mg/dL (ref 8.6–10.3)
Chloride: 99 mmol/L (ref 98–110)
Creat: 0.57 mg/dL — ABNORMAL LOW (ref 0.70–1.25)
Globulin: 2.8 g/dL (calc) (ref 1.9–3.7)
Glucose, Bld: 122 mg/dL — ABNORMAL HIGH (ref 65–99)
Potassium: 3.9 mmol/L (ref 3.5–5.3)
Sodium: 134 mmol/L — ABNORMAL LOW (ref 135–146)
Total Bilirubin: 0.5 mg/dL (ref 0.2–1.2)
Total Protein: 5.9 g/dL — ABNORMAL LOW (ref 6.1–8.1)

## 2021-01-15 LAB — CBC WITH DIFFERENTIAL/PLATELET
Absolute Monocytes: 1000 cells/uL — ABNORMAL HIGH (ref 200–950)
Basophils Absolute: 70 cells/uL (ref 0–200)
Basophils Relative: 0.7 %
Eosinophils Absolute: 30 cells/uL (ref 15–500)
Eosinophils Relative: 0.3 %
HCT: 31.8 % — ABNORMAL LOW (ref 38.5–50.0)
Hemoglobin: 11.4 g/dL — ABNORMAL LOW (ref 13.2–17.1)
Lymphs Abs: 1080 cells/uL (ref 850–3900)
MCH: 35.7 pg — ABNORMAL HIGH (ref 27.0–33.0)
MCHC: 35.8 g/dL (ref 32.0–36.0)
MCV: 99.7 fL (ref 80.0–100.0)
MPV: 11.1 fL (ref 7.5–12.5)
Monocytes Relative: 10 %
Neutro Abs: 7820 cells/uL — ABNORMAL HIGH (ref 1500–7800)
Neutrophils Relative %: 78.2 %
Platelets: 274 10*3/uL (ref 140–400)
RBC: 3.19 10*6/uL — ABNORMAL LOW (ref 4.20–5.80)
RDW: 15 % (ref 11.0–15.0)
Total Lymphocyte: 10.8 %
WBC: 10 10*3/uL (ref 3.8–10.8)

## 2021-01-15 LAB — PROTIME-INR
INR: 1.3 — ABNORMAL HIGH
Prothrombin Time: 13.5 s — ABNORMAL HIGH (ref 9.0–11.5)

## 2021-01-19 ENCOUNTER — Telehealth: Payer: Self-pay

## 2021-01-19 NOTE — Telephone Encounter (Signed)
Spoke to pt's sister, TCS moved up to 01/27/21--PM. Endo scheduler informed.

## 2021-01-19 NOTE — Telephone Encounter (Signed)
Pt on cancellation list to move up TCS sooner if possible. Dr. Abbey Chatters has opening 01/27/21. Tried to call sister [name]), LMOVM for return call.

## 2021-01-19 NOTE — Telephone Encounter (Signed)
Pre-op/covid test 01/25/21 at 10:45am.   Called and informed sister of pre-op/covid test appt. They will come by office to pick up procedure instructions 01/25/21. Informed her pt will start clear liquids after lunch 01/25/21.

## 2021-01-20 NOTE — H&P (View-Only) (Signed)
Primary Care Physician:  [facility] [place]:  Dr. Abbey Chatters  Chief Complaint  Patient presents with  . Consult    TCS never done prior.   Marland Kitchen HFU    Had dark stools recently at [facility]. Had EGD done    HPI:   Ronald [name] is a 65 y.o. male who presents to the clinic today as a new patient for hospital follow-up visit.  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  During that admission he underwent EGD without evidence of varices but did note portal hypertensive gastropathy and abnormal duodenal mucosa.  Duodenal biopsy showed lymphangiectasia.  Patient was given PRBCs and discharged home with stable hemoglobin of 9.  Potassium was 2.7 on day of discharge as well.  During admission patient also had a CT abdomen pelvis which showed the previously noted pancreatic mass actually appears to have decreased substantially compared to prior study.  The report makes note of normal liver though after personally reviewing the images it does appear cirrhotic to me.  He does have ascites as well.  Since his discharge, patient states he is doing okay.  Has had no gross melena or hematochezia.  Still mildly fatigued though this is improving.  Does have a stout alcohol abuse history   Past Medical History:  Diagnosis Date  . Alcoholism ([place])   . Anemia    iron deficiency  . Cancer ([place])    lung Stage I- 10/11/20  . Collapse of left lung    lower lobe  . COPD (chronic obstructive pulmonary disease) ([place])   . Depression   . Dyspnea    Exertion  . Family history of adverse reaction to anesthesia    sister had a bad headache  . Hypertension    10/11/20 - not in years  . Mediastinal adenopathy     and endobronchial lesion  . Pneumonia   . TB  (pulmonary tuberculosis)   . Wears dentures     Past Surgical History:  Procedure Laterality Date  . BIOPSY  01/06/2021   Procedure: BIOPSY;  Surgeon: Milus Banister, MD;  Location: [facility];  Service: Endoscopy;;  . ESOPHAGOGASTRODUODENOSCOPY (EGD) WITH PROPOFOL N/A 10/28/2020   Procedure: ESOPHAGOGASTRODUODENOSCOPY (EGD) WITH PROPOFOL;  Surgeon: Milus Banister, MD;  [facility];  Service: Endoscopy;  Laterality: N/A;  . ESOPHAGOGASTRODUODENOSCOPY (EGD) WITH PROPOFOL N/A 01/06/2021   Procedure: ESOPHAGOGASTRODUODENOSCOPY (EGD) WITH PROPOFOL;  Surgeon: Milus Banister, MD;  [facility];  Service: Endoscopy;  Laterality: N/A;  . EUS N/A 10/28/2020   Procedure: UPPER ENDOSCOPIC ULTRASOUND (EUS) RADIAL;  Surgeon: Milus Banister, MD;  [facility];  Service: Endoscopy;  Laterality: N/A;  . FINE NEEDLE ASPIRATION N/A 10/28/2020   Procedure: FINE NEEDLE ASPIRATION (FNA) LINEAR;  Surgeon: Milus Banister, MD;  [facility];  Service: Endoscopy;  Laterality: N/A;  . MULTIPLE TOOTH EXTRACTIONS    . VIDEO BRONCHOSCOPY N/A 10/12/2020   Procedure: VIDEO BRONCHOSCOPY WITH CRYOTHERAPY AND BALLOON DILATATION;  Surgeon: Garner Nash, DO;  Location: [facility];  Service: Pulmonary;  Laterality: N/A;  . VIDEO BRONCHOSCOPY WITH ENDOBRONCHIAL ULTRASOUND N/A 10/02/2018   Procedure: VIDEO BRONCHOSCOPY WITH ENDOBRONCHIAL ULTRASOUND;  Surgeon: Garner Nash, DO;  [facility];  Service: Thoracic;  Laterality: N/A;  . VIDEO BRONCHOSCOPY WITH  ENDOBRONCHIAL ULTRASOUND N/A 10/12/2020   Procedure: VIDEO BRONCHOSCOPY WITH ENDOBRONCHIAL ULTRASOUND;  Surgeon: Garner Nash, DO;  Location: [facility];  Service: Pulmonary;  Laterality: N/A;    Current Outpatient Medications  Medication Sig Dispense Refill  . albuterol (PROVENTIL) (2.5 MG/3ML) 0.083% nebulizer solution TAKE 3ML BY MOUTH VIA NEBULIZER EVERY 4 HOURS AS NEEDED FOR WHEEZING OR SHORTNESS OF BREATH (Patient  taking differently: Take 2.5 mg by nebulization every 4 (four) hours as needed (wheezing/shortness of breath.).) 75 mL 3  . albuterol (VENTOLIN HFA) 108 (90 Base) MCG/ACT inhaler INHALE 2 PUFFS BY MOUTH EVERY 6 HOURS AS NEEDED FOR SHORTNESS OF BREATH (Patient taking differently: Inhale 2 puffs into the lungs every 6 (six) hours as needed for shortness of breath.) 18 g 5  . ascorbic acid (VITAMIN C) 500 MG tablet Take 500 mg by mouth daily.    . cholecalciferol (VITAMIN D3) 25 MCG (1000 UNIT) tablet Take 1,000 Units by mouth daily.    . diphenoxylate-atropine (LOMOTIL) 2.5-0.025 MG tablet Take 1-2 tablets by mouth 4 (four) times daily as needed for diarrhea or loose stools (Maximum of 8 tablets/day). 60 tablet 0  . feeding supplement, ENSURE ENLIVE, (ENSURE ENLIVE) LIQD Take 237 mLs by mouth 2 (two) times daily between meals. (Patient taking differently: Take 237 mLs by mouth daily as needed (supplement).) 237 mL 12  . fexofenadine (ALLEGRA) 180 MG tablet Take 180 mg by mouth daily.    . Fluticasone-Umeclidin-Vilant (TRELEGY ELLIPTA) 100-62.5-25 MCG/INH AEPB Inhale 1 puff into the lungs daily. 60 each 3  . potassium chloride SA (KLOR-CON) 20 MEQ tablet Take 1 tablet (20 mEq total) by mouth 2 (two) times daily. 60 tablet 0  . mirtazapine (REMERON) 7.5 MG tablet Take 1 tablet (7.5 mg total) by mouth at bedtime. (Patient not taking: Reported on 01/12/2021) 30 tablet 2  . polyethylene glycol-electrolytes (TRILYTE) 420 g solution Take 4,000 mLs by mouth as directed. 4000 mL 0   No current facility-administered medications for this visit.    Allergies as of 01/12/2021  . (No Known Allergies)    Family History  Problem Relation Age of Onset  . Heart disease Mother   . Heart disease Father   . Diabetes Other   . Hypertension Other   . Ovarian cancer Maternal Grandmother     Social History   Socioeconomic History  . Marital status: Legally Separated    Spouse name: Not on file  . Number of  children: Not on file  . Years of education: Not on file  . Highest education level: Not on file  Occupational History  . Not on file  Tobacco Use  . Smoking status: Former Smoker    Packs/day: 2.00    Types: Cigarettes    Quit date: 09/19/2018    Years since quitting: 2.3  . Smokeless tobacco: Current User    Types: Snuff  Vaping Use  . Vaping Use: Never used  Substance and Sexual Activity  . Alcohol use: Yes    Alcohol/week: 4.0 standard drinks    Types: 4 Cans of beer per week    Comment: occas  . Drug use: No  . Sexual activity: Never  Other Topics Concern  . Not on file  Social History Narrative  . Not on file   Social Determinants of Health   Financial Resource Strain: Not on file  Food Insecurity: Not on file  Transportation Needs: Not on file  Physical Activity: Not on file  Stress: Not on file  Social Connections: Not on file  Intimate Partner Violence: Not on file    Subjective: Review of Systems  Constitutional: Positive for malaise/fatigue. Negative for chills and fever.  HENT: Negative for congestion and hearing loss.   Eyes: Negative for blurred vision and double vision.  Respiratory: Negative for cough and shortness of breath.   Cardiovascular: Negative for chest pain and palpitations.  Gastrointestinal: Negative for abdominal pain, blood in stool, constipation, diarrhea, heartburn, melena and vomiting.  Genitourinary: Negative for dysuria and urgency.  Musculoskeletal: Negative for joint pain and myalgias.  Skin: Negative for itching and rash.  Neurological: Negative for dizziness and headaches.  Psychiatric/Behavioral: Negative for depression. The patient is not nervous/anxious.        Objective: BP 110/64   Pulse (!) 115   Temp 97.7 F (36.5 C)   Ht 5\' 4"  (1.626 m)   Wt 152 lb (68.9 kg)   BMI 26.09 kg/m  Physical Exam Constitutional:      Appearance: Normal appearance.  HENT:     Head: Normocephalic and atraumatic.  Eyes:      Extraocular Movements: Extraocular movements intact.     Conjunctiva/sclera: Conjunctivae normal.  Cardiovascular:     Rate and Rhythm: Normal rate and regular rhythm.  Pulmonary:     Effort: Pulmonary effort is normal.     Breath sounds: Normal breath sounds.  Abdominal:     General: Bowel sounds are normal.     Palpations: Abdomen is soft.  Musculoskeletal:        General: Normal range of motion.     Cervical back: Normal range of motion and neck supple.  Skin:    General: Skin is warm.  Neurological:     General: No focal deficit present.     Mental Status: He is alert and oriented to person, place, and time.  Psychiatric:        Mood and Affect: Mood normal.        Behavior: Behavior normal.      Assessment: *Anemia-questionable GI blood loss *Cirrhosis -new diagnosis?  Likely alcohol induced *Hypokalemia  Plan: We will schedule patient for urgent colonoscopy to further evaluate his worsening anemia.  I will also check hemoglobin today in office.  Discussed cirrhosis briefly with patient today.  He was unaware of this diagnosis in the past.  We will check basic meld labs.  I believe his most pressing issue is above anemia which we will evaluate promptly.  We can discuss his cirrhosis more in depth on follow-up visit.  Check potassium as well given recent hypokalemia.  Further recommendations to follow.  01/20/2021 1:24 PM   Disclaimer: This note was dictated with voice recognition software. Similar sounding words can inadvertently be transcribed and may not be corrected upon review.

## 2021-01-20 NOTE — Progress Notes (Signed)
Primary Care Physician:  [facility] [place] [facility]:  Dr. Abbey Chatters  Chief Complaint  Patient presents with  . Consult    TCS never done prior.   Marland Kitchen HFU    Had dark stools recently at [facility]. Had EGD done    HPI:   Ronald [name] is a 65 y.o. male who presents to the clinic today as a new patient for hospital follow-up visit.  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  During that admission he underwent EGD without evidence of varices but did note portal hypertensive gastropathy and abnormal duodenal mucosa.  Duodenal biopsy showed lymphangiectasia.  Patient was given PRBCs and discharged home with stable hemoglobin of 9.  Potassium was 2.7 on day of discharge as well.  During admission patient also had a CT abdomen pelvis which showed the previously noted pancreatic mass actually appears to have decreased substantially compared to prior study.  The report makes note of normal liver though after personally reviewing the images it does appear cirrhotic to me.  He does have ascites as well.  Since his discharge, patient states he is doing okay.  Has had no gross melena or hematochezia.  Still mildly fatigued though this is improving.  Does have a stout alcohol abuse history   Past Medical History:  Diagnosis Date  . Alcoholism ([place])   . Anemia    iron deficiency  . Cancer ([place])    lung Stage I- 10/11/20  . Collapse of left lung    lower lobe  . COPD (chronic obstructive pulmonary disease) ([place])   . Depression   . Dyspnea    Exertion  . Family history of adverse reaction to anesthesia    sister had a bad headache  . Hypertension    10/11/20 - not in years  . Mediastinal adenopathy     and endobronchial lesion  . Pneumonia   . TB  (pulmonary tuberculosis)   . Wears dentures     Past Surgical History:  Procedure Laterality Date  . BIOPSY  01/06/2021   Procedure: BIOPSY;  Surgeon: Milus Banister, MD;  Location[facility];  Service: Endoscopy;;  . ESOPHAGOGASTRODUODENOSCOPY (EGD) WITH PROPOFOL N/A 10/28/2020   Procedure: ESOPHAGOGASTRODUODENOSCOPY (EGD) WITH PROPOFOL;  Surgeon: Milus Banister, MD;  [facility];  Service: Endoscopy;  Laterality: N/A;  . ESOPHAGOGASTRODUODENOSCOPY (EGD) WITH PROPOFOL N/A 01/06/2021   Procedure: ESOPHAGOGASTRODUODENOSCOPY (EGD) WITH PROPOFOL;  Surgeon: Milus Banister, MD;  [facility];  Service: Endoscopy;  Laterality: N/A;  . EUS N/A 10/28/2020   Procedure: UPPER ENDOSCOPIC ULTRASOUND (EUS) RADIAL;  Surgeon: Milus Banister, MD;  [facility];  Service: Endoscopy;  Laterality: N/A;  . FINE NEEDLE ASPIRATION N/A 10/28/2020   Procedure: FINE NEEDLE ASPIRATION (FNA) LINEAR;  Surgeon: Milus Banister, MD;  [facility];  Service: Endoscopy;  Laterality: N/A;  . MULTIPLE TOOTH EXTRACTIONS    . VIDEO BRONCHOSCOPY N/A 10/12/2020   Procedure: VIDEO BRONCHOSCOPY WITH CRYOTHERAPY AND BALLOON DILATATION;  Surgeon: Garner Nash, DO;  Location: [facility];  Service: Pulmonary;  Laterality: N/A;  . VIDEO BRONCHOSCOPY WITH ENDOBRONCHIAL ULTRASOUND N/A 10/02/2018   Procedure: VIDEO BRONCHOSCOPY WITH ENDOBRONCHIAL ULTRASOUND;  Surgeon: Garner Nash, DO;  [facility];  Service: Thoracic;  Laterality: N/A;  . VIDEO BRONCHOSCOPY WITH  ENDOBRONCHIAL ULTRASOUND N/A 10/12/2020   Procedure: VIDEO BRONCHOSCOPY WITH ENDOBRONCHIAL ULTRASOUND;  Surgeon: Garner Nash, DO;  Location: [facility];  Service: Pulmonary;  Laterality: N/A;    Current Outpatient Medications  Medication Sig Dispense Refill  . albuterol (PROVENTIL) (2.5 MG/3ML) 0.083% nebulizer solution TAKE 3ML BY MOUTH VIA NEBULIZER EVERY 4 HOURS AS NEEDED FOR WHEEZING OR SHORTNESS OF BREATH (Patient  taking differently: Take 2.5 mg by nebulization every 4 (four) hours as needed (wheezing/shortness of breath.).) 75 mL 3  . albuterol (VENTOLIN HFA) 108 (90 Base) MCG/ACT inhaler INHALE 2 PUFFS BY MOUTH EVERY 6 HOURS AS NEEDED FOR SHORTNESS OF BREATH (Patient taking differently: Inhale 2 puffs into the lungs every 6 (six) hours as needed for shortness of breath.) 18 g 5  . ascorbic acid (VITAMIN C) 500 MG tablet Take 500 mg by mouth daily.    . cholecalciferol (VITAMIN D3) 25 MCG (1000 UNIT) tablet Take 1,000 Units by mouth daily.    . diphenoxylate-atropine (LOMOTIL) 2.5-0.025 MG tablet Take 1-2 tablets by mouth 4 (four) times daily as needed for diarrhea or loose stools (Maximum of 8 tablets/day). 60 tablet 0  . feeding supplement, ENSURE ENLIVE, (ENSURE ENLIVE) LIQD Take 237 mLs by mouth 2 (two) times daily between meals. (Patient taking differently: Take 237 mLs by mouth daily as needed (supplement).) 237 mL 12  . fexofenadine (ALLEGRA) 180 MG tablet Take 180 mg by mouth daily.    . Fluticasone-Umeclidin-Vilant (TRELEGY ELLIPTA) 100-62.5-25 MCG/INH AEPB Inhale 1 puff into the lungs daily. 60 each 3  . potassium chloride SA (KLOR-CON) 20 MEQ tablet Take 1 tablet (20 mEq total) by mouth 2 (two) times daily. 60 tablet 0  . mirtazapine (REMERON) 7.5 MG tablet Take 1 tablet (7.5 mg total) by mouth at bedtime. (Patient not taking: Reported on 01/12/2021) 30 tablet 2  . polyethylene glycol-electrolytes (TRILYTE) 420 g solution Take 4,000 mLs by mouth as directed. 4000 mL 0   No current facility-administered medications for this visit.    Allergies as of 01/12/2021  . (No Known Allergies)    Family History  Problem Relation Age of Onset  . Heart disease Mother   . Heart disease Father   . Diabetes Other   . Hypertension Other   . Ovarian cancer Maternal Grandmother     Social History   Socioeconomic History  . Marital status: Legally Separated    Spouse name: Not on file  . Number of  children: Not on file  . Years of education: Not on file  . Highest education level: Not on file  Occupational History  . Not on file  Tobacco Use  . Smoking status: Former Smoker    Packs/day: 2.00    Types: Cigarettes    Quit date: 09/19/2018    Years since quitting: 2.3  . Smokeless tobacco: Current User    Types: Snuff  Vaping Use  . Vaping Use: Never used  Substance and Sexual Activity  . Alcohol use: Yes    Alcohol/week: 4.0 standard drinks    Types: 4 Cans of beer per week    Comment: occas  . Drug use: No  . Sexual activity: Never  Other Topics Concern  . Not on file  Social History Narrative  . Not on file   Social Determinants of Health   Financial Resource Strain: Not on file  Food Insecurity: Not on file  Transportation Needs: Not on file  Physical Activity: Not on file  Stress: Not on file  Social Connections: Not on file  Intimate Partner Violence: Not on file    Subjective: Review of Systems  Constitutional: Positive for malaise/fatigue. Negative for chills and fever.  HENT: Negative for congestion and hearing loss.   Eyes: Negative for blurred vision and double vision.  Respiratory: Negative for cough and shortness of breath.   Cardiovascular: Negative for chest pain and palpitations.  Gastrointestinal: Negative for abdominal pain, blood in stool, constipation, diarrhea, heartburn, melena and vomiting.  Genitourinary: Negative for dysuria and urgency.  Musculoskeletal: Negative for joint pain and myalgias.  Skin: Negative for itching and rash.  Neurological: Negative for dizziness and headaches.  Psychiatric/Behavioral: Negative for depression. The patient is not nervous/anxious.        Objective: BP 110/64   Pulse (!) 115   Temp 97.7 F (36.5 C)   Ht 5\' 4"  (1.626 m)   Wt 152 lb (68.9 kg)   BMI 26.09 kg/m  Physical Exam Constitutional:      Appearance: Normal appearance.  HENT:     Head: Normocephalic and atraumatic.  Eyes:      Extraocular Movements: Extraocular movements intact.     Conjunctiva/sclera: Conjunctivae normal.  Cardiovascular:     Rate and Rhythm: Normal rate and regular rhythm.  Pulmonary:     Effort: Pulmonary effort is normal.     Breath sounds: Normal breath sounds.  Abdominal:     General: Bowel sounds are normal.     Palpations: Abdomen is soft.  Musculoskeletal:        General: Normal range of motion.     Cervical back: Normal range of motion and neck supple.  Skin:    General: Skin is warm.  Neurological:     General: No focal deficit present.     Mental Status: He is alert and oriented to person, place, and time.  Psychiatric:        Mood and Affect: Mood normal.        Behavior: Behavior normal.      Assessment: *Anemia-questionable GI blood loss *Cirrhosis -new diagnosis?  Likely alcohol induced *Hypokalemia  Plan: We will schedule patient for urgent colonoscopy to further evaluate his worsening anemia.  I will also check hemoglobin today in office.  Discussed cirrhosis briefly with patient today.  He was unaware of this diagnosis in the past.  We will check basic meld labs.  I believe his most pressing issue is above anemia which we will evaluate promptly.  We can discuss his cirrhosis more in depth on follow-up visit.  Check potassium as well given recent hypokalemia.  Further recommendations to follow.  01/20/2021 1:24 PM   Disclaimer: This note was dictated with voice recognition software. Similar sounding words can inadvertently be transcribed and may not be corrected upon review.

## 2021-01-24 ENCOUNTER — Telehealth: Payer: Self-pay | Admitting: Oncology

## 2021-01-24 NOTE — Telephone Encounter (Signed)
Scheduled appt per 3/25 sch msg. Pt's sister is aware.

## 2021-01-24 NOTE — Patient Instructions (Signed)
Ronald [name]  01/24/2021     @PREFPERIOPPHARMACY @   Your procedure is scheduled on  01/27/2021   Report to [facility] at  22  A.M.   Call this number if you have problems the morning of surgery:  [phone]   Remember:  Follow the diet and prep instructions given to you by the office.                       Take these medicines the morning of surgery with A SIP OF WATER  [place].   Use your nebulizer and your inhalers before you come.     Please brush your teeth.  Do not wear jewelry, make-up or nail polish.  Do not wear lotions, powders, or perfumes, or deodorant.  Do not shave 48 hours prior to surgery.  Men may shave face and neck.  Do not bring valuables to the hospital.  [facility] is not responsible for any belongings or valuables.   Contacts, dentures or bridgework may not be worn into surgery.  Leave your suitcase in the car.  After surgery it may be brought to your room.  For patients admitted to the hospital, discharge time will be determined by your treatment team.  Patients discharged the day of surgery will not be allowed to drive home and must have someone with them for 24 hours.   Special instructions:  DO NOT smoke tobacco or vape the morning of your procedure.    Please read over the following fact sheets that you were given. Anesthesia Post-op Instructions and Care and Recovery After Surgery       Colonoscopy, Adult, Care After This sheet gives you information about how to care for yourself after your procedure. Your health care provider may also give you more specific instructions. If you have problems or questions, contact your health care provider. What can I expect after the procedure? After the procedure, it is common to have:  A small amount of blood in your stool for 24 hours after the procedure.  Some gas.  Mild cramping or bloating of your abdomen. Follow these instructions at home: Eating and drinking  Drink  enough fluid to keep your urine pale yellow.  Follow instructions from your health care provider about eating or drinking restrictions.  Resume your normal diet as instructed by your health care provider. Avoid heavy or fried foods that are hard to digest.   Activity  Rest as told by your health care provider.  Avoid sitting for a long time without moving. Get up to take short walks every 1-2 hours. This is important to improve blood flow and breathing. Ask for help if you feel weak or unsteady.  Return to your normal activities as told by your health care provider. Ask your health care provider what activities are safe for you. Managing cramping and bloating  Try walking around when you have cramps or feel bloated.  Apply heat to your abdomen as told by your health care provider. Use the heat source that your health care provider recommends, such as a moist heat pack or a heating pad. ? Place a towel between your skin and the heat source. ? Leave the heat on for 20-30 minutes. ? Remove the heat if your skin turns bright red. This is especially important if you are unable to feel pain, heat, or cold. You may have a greater risk of getting burned.   General instructions  If you were given a sedative during the procedure, it can affect you for several hours. Do not drive or operate machinery until your health care provider says that it is safe.  For the first 24 hours after the procedure: ? Do not sign important documents. ? Do not drink alcohol. ? Do your regular daily activities at a slower pace than normal. ? Eat soft foods that are easy to digest.  Take over-the-counter and prescription medicines only as told by your health care provider.  Keep all follow-up visits as told by your health care provider. This is important. Contact a health care provider if:  You have blood in your stool 2-3 days after the procedure. Get help right away if you have:  More than a small spotting of  blood in your stool.  Large blood clots in your stool.  Swelling of your abdomen.  Nausea or vomiting.  A fever.  Increasing pain in your abdomen that is not relieved with medicine. Summary  After the procedure, it is common to have a small amount of blood in your stool. You may also have mild cramping and bloating of your abdomen.  If you were given a sedative during the procedure, it can affect you for several hours. Do not drive or operate machinery until your health care provider says that it is safe.  Get help right away if you have a lot of blood in your stool, nausea or vomiting, a fever, or increased pain in your abdomen. This information is not intended to replace advice given to you by your health care provider. Make sure you discuss any questions you have with your health care provider. Document Revised: 10/10/2019 Document Reviewed: 05/12/2019 Elsevier Patient Education  2021 [place] After This sheet gives you information about how to care for yourself after your procedure. Your health care provider may also give you more specific instructions. If you have problems or questions, contact your health care provider. What can I expect after the procedure? After the procedure, it is common to have:  Tiredness.  Forgetfulness about what happened after the procedure.  Impaired judgment for important decisions.  Nausea or vomiting.  Some difficulty with balance. Follow these instructions at home: For the time period you were told by your health care provider:  Rest as needed.  Do not participate in activities where you could fall or become injured.  Do not drive or use machinery.  Do not drink alcohol.  Do not take sleeping pills or medicines that cause drowsiness.  Do not make important decisions or sign legal documents.  Do not take care of children on your own.      Eating and drinking  Follow the diet that is  recommended by your health care provider.  Drink enough fluid to keep your urine pale yellow.  If you vomit: ? Drink water, juice, or soup when you can drink without vomiting. ? Make sure you have little or no nausea before eating solid foods. General instructions  Have a responsible adult stay with you for the time you are told. It is important to have someone help care for you until you are awake and alert.  Take over-the-counter and prescription medicines only as told by your health care provider.  If you have sleep apnea, surgery and certain medicines can increase your risk for breathing problems. Follow instructions from your health care provider about wearing your sleep device: ? Anytime you are sleeping, including during  daytime naps. ? While taking prescription pain medicines, sleeping medicines, or medicines that make you drowsy.  Avoid smoking.  Keep all follow-up visits as told by your health care provider. This is important. Contact a health care provider if:  You keep feeling nauseous or you keep vomiting.  You feel light-headed.  You are still sleepy or having trouble with balance after 24 hours.  You develop a rash.  You have a fever.  You have redness or swelling around the IV site. Get help right away if:  You have trouble breathing.  You have new-onset confusion at home. Summary  For several hours after your procedure, you may feel tired. You may also be forgetful and have poor judgment.  Have a responsible adult stay with you for the time you are told. It is important to have someone help care for you until you are awake and alert.  Rest as told. Do not drive or operate machinery. Do not drink alcohol or take sleeping pills.  Get help right away if you have trouble breathing, or if you suddenly become confused. This information is not intended to replace advice given to you by your health care provider. Make sure you discuss any questions you have  with your health care provider. Document Revised: 07/01/2020 Document Reviewed: 09/18/2019 Elsevier Patient Education  2021 [employer].

## 2021-01-25 ENCOUNTER — Other Ambulatory Visit: Payer: Self-pay

## 2021-01-25 ENCOUNTER — Encounter (HOSPITAL_COMMUNITY)
Admission: RE | Admit: 2021-01-25 | Discharge: 2021-01-25 | Disposition: A | Discharge status: Home / Self Care | Payer: Medicare Other | Source: Ambulatory Visit | Attending: Internal Medicine | Admitting: Internal Medicine

## 2021-01-25 ENCOUNTER — Other Ambulatory Visit (HOSPITAL_COMMUNITY)
Admission: RE | Admit: 2021-01-25 | Discharge: 2021-01-25 | Disposition: A | Discharge status: Home / Self Care | Payer: Medicare Other | Source: Ambulatory Visit | Attending: Internal Medicine | Admitting: Internal Medicine

## 2021-01-25 DIAGNOSIS — Z20822 Contact with and (suspected) exposure to covid-19: Secondary | ICD-10-CM | POA: Diagnosis not present

## 2021-01-25 DIAGNOSIS — Z01812 Encounter for preprocedural laboratory examination: Secondary | ICD-10-CM | POA: Diagnosis present

## 2021-01-25 LAB — SARS CORONAVIRUS 2 (TAT 6-24 HRS): SARS Coronavirus 2: NEGATIVE

## 2021-01-27 ENCOUNTER — Ambulatory Visit (HOSPITAL_COMMUNITY)
Admission: RE | Admit: 2021-01-27 | Discharge: 2021-01-27 | Disposition: A | Discharge status: Home / Self Care | Payer: Medicare Other | Attending: Internal Medicine | Admitting: Internal Medicine

## 2021-01-27 ENCOUNTER — Encounter (HOSPITAL_COMMUNITY): Payer: Self-pay

## 2021-01-27 ENCOUNTER — Ambulatory Visit (HOSPITAL_COMMUNITY): Payer: Medicare Other | Admitting: Anesthesiology

## 2021-01-27 ENCOUNTER — Other Ambulatory Visit: Payer: Self-pay

## 2021-01-27 ENCOUNTER — Encounter (HOSPITAL_COMMUNITY)
Admission: RE | Disposition: A | Discharge status: Home / Self Care | Payer: Self-pay | Source: Home / Self Care | Attending: Internal Medicine

## 2021-01-27 DIAGNOSIS — K635 Polyp of colon: Secondary | ICD-10-CM

## 2021-01-27 DIAGNOSIS — Z79899 Other long term (current) drug therapy: Secondary | ICD-10-CM | POA: Insufficient documentation

## 2021-01-27 DIAGNOSIS — D124 Benign neoplasm of descending colon: Secondary | ICD-10-CM | POA: Insufficient documentation

## 2021-01-27 DIAGNOSIS — C3490 Malignant neoplasm of unspecified part of unspecified bronchus or lung: Secondary | ICD-10-CM | POA: Insufficient documentation

## 2021-01-27 DIAGNOSIS — C7889 Secondary malignant neoplasm of other digestive organs: Secondary | ICD-10-CM | POA: Diagnosis not present

## 2021-01-27 DIAGNOSIS — R188 Other ascites: Secondary | ICD-10-CM | POA: Insufficient documentation

## 2021-01-27 DIAGNOSIS — Z87891 Personal history of nicotine dependence: Secondary | ICD-10-CM | POA: Insufficient documentation

## 2021-01-27 DIAGNOSIS — K648 Other hemorrhoids: Secondary | ICD-10-CM | POA: Diagnosis not present

## 2021-01-27 DIAGNOSIS — D509 Iron deficiency anemia, unspecified: Secondary | ICD-10-CM

## 2021-01-27 DIAGNOSIS — Z7951 Long term (current) use of inhaled steroids: Secondary | ICD-10-CM | POA: Insufficient documentation

## 2021-01-27 HISTORY — PX: COLONOSCOPY WITH PROPOFOL: SHX5780

## 2021-01-27 HISTORY — PX: POLYPECTOMY: SHX149

## 2021-01-27 SURGERY — COLONOSCOPY WITH PROPOFOL
Anesthesia: General

## 2021-01-27 MED ORDER — PHENYLEPHRINE 40 MCG/ML (10ML) SYRINGE FOR IV PUSH (FOR BLOOD PRESSURE SUPPORT)
PREFILLED_SYRINGE | INTRAVENOUS | Status: DC | PRN
  Administered 2021-01-27 (×2): 80 ug via INTRAVENOUS

## 2021-01-27 MED ORDER — LIDOCAINE HCL (CARDIAC) PF 100 MG/5ML IV SOSY
PREFILLED_SYRINGE | INTRAVENOUS | Status: DC | PRN
  Administered 2021-01-27: 50 mg via INTRAVENOUS

## 2021-01-27 MED ORDER — STERILE WATER FOR IRRIGATION IR SOLN
Status: DC | PRN
  Administered 2021-01-27: 200 mL

## 2021-01-27 MED ORDER — PROPOFOL 10 MG/ML IV BOLUS
INTRAVENOUS | Status: DC | PRN
  Administered 2021-01-27: 100 mg via INTRAVENOUS

## 2021-01-27 MED ORDER — PROPOFOL 500 MG/50ML IV EMUL
INTRAVENOUS | Status: DC | PRN
  Administered 2021-01-27: 150 ug/kg/min via INTRAVENOUS

## 2021-01-27 MED ORDER — PHENYLEPHRINE 40 MCG/ML (10ML) SYRINGE FOR IV PUSH (FOR BLOOD PRESSURE SUPPORT)
PREFILLED_SYRINGE | INTRAVENOUS | Status: AC
  Filled 2021-01-27: qty 10

## 2021-01-27 MED ORDER — LACTATED RINGERS IV SOLN: INTRAVENOUS | Status: DC

## 2021-01-27 NOTE — Anesthesia Postprocedure Evaluation (Signed)
Anesthesia Post Note  Patient: Ronald [name]  Procedure(s) Performed: COLONOSCOPY WITH PROPOFOL (N/A ) POLYPECTOMY INTESTINAL  Patient location during evaluation: PACU Anesthesia Type: General Level of consciousness: awake and alert and oriented Pain management: pain level controlled Vital Signs Assessment: post-procedure vital signs reviewed and stable Respiratory status: spontaneous breathing and respiratory function stable Cardiovascular status: blood pressure returned to baseline and stable Postop Assessment: no apparent nausea or vomiting Anesthetic complications: no   No complications documented.   Last Vitals:  Vitals:   01/27/21 0907 01/27/21 1042  BP: 119/73 95/63  Pulse: (!) 102 83  Resp: 19 18  Temp: 36.8 C 36.8 C  SpO2: 100% 98%    Last Pain:  Vitals:   01/27/21 1042  TempSrc: Axillary  PainSc: 0-No pain                 Alvan Culpepper C Calil Amor

## 2021-01-27 NOTE — Op Note (Signed)
[facility] Patient Name: Ronald [name] Procedure Date: 01/27/2021 10:11 AM MRN: [id number] Date of Birth: January 26, 1956 Attending MD: Elon Alas. Abbey Chatters DO CSN: [id number] Age: 65 Admit Type: Outpatient Procedure:                Colonoscopy Indications:              Iron deficiency anemia Providers:                Elon Alas. Abbey Chatters, DO, Crystal Page, Aram Candela Referring MD:              Medicines:                See the Anesthesia note for documentation of the                            administered medications Complications:            No immediate complications. Estimated Blood Loss:     Estimated blood loss was minimal. Procedure:                Pre-Anesthesia Assessment:                           - The anesthesia plan was to use monitored                            anesthesia care (MAC).                           After obtaining informed consent, the colonoscope                            was passed under direct vision. Throughout the                            procedure, the patient's blood pressure, pulse, and                            oxygen saturations were monitored continuously. The                            [id number]-[id number] ([id number]) scope was introduced through                            the anus and advanced to the the cecum, identified                            by appendiceal orifice and ileocecal valve. The                            colonoscopy was performed without difficulty. The                            patient tolerated the procedure well. The quality                            of the bowel  preparation was evaluated using the                            BBPS [facility] Bowel Preparation Scale) with scores                            of: Right Colon = 2 (minor amount of residual                            staining, small fragments of stool and/or opaque                            liquid, but mucosa seen well), Transverse Colon = 3                            (entire  mucosa seen well with no residual staining,                            small fragments of stool or opaque liquid) and Left                            Colon = 3 (entire mucosa seen well with no residual                            staining, small fragments of stool or opaque                            liquid). The total BBPS score equals 8. The quality                            of the bowel preparation was good. Scope In: 10:24:42 AM Scope Out: 10:37:33 AM Scope Withdrawal Time: 0 hours 10 minutes 26 seconds  Total Procedure Duration: 0 hours 12 minutes 51 seconds  Findings:      The perianal and digital rectal examinations were normal.      Non-bleeding internal hemorrhoids were found during endoscopy.      Four sessile polyps were found in the descending colon. The polyps were       5 to 8 mm in size. These polyps were removed with a cold snare.       Resection and retrieval were complete. Impression:               - Non-bleeding internal hemorrhoids.                           - Four 5 to 8 mm polyps in the descending colon,                            removed with a cold snare. Resected and retrieved. Moderate Sedation:      Per Anesthesia Care Recommendation:           - Patient has a contact number available for  emergencies. The signs and symptoms of potential                            delayed complications were discussed with the                            patient. Return to normal activities tomorrow.                            Written discharge instructions were provided to the                            patient.                           - Resume previous diet.                           - Continue present medications.                           - Await pathology results.                           - Repeat colonoscopy in 3 years for surveillance.                           - Return to GI clinic in 6 weeks. Procedure Code(s):        --- Professional  ---                           [phone], Colonoscopy, flexible; with removal of                            tumor(s), polyp(s), or other lesion(s) by snare                            technique Diagnosis Code(s):        --- Professional ---                           K63.5, Polyp of colon                           K64.8, Other hemorrhoids                           D50.9, Iron deficiency anemia, unspecified CPT copyright 2019 American Medical Association. All rights reserved. The codes documented in this report are preliminary and upon coder review may  be revised to meet current compliance requirements. Elon Alas. Abbey Chatters, DO [place] Abbey Chatters, DO 01/27/2021 10:40:22 AM This report has been signed electronically. Number of Addenda: 0

## 2021-01-27 NOTE — Interval H&P Note (Signed)
History and Physical Interval Note:  01/27/2021 9:26 AM  Ronald [name]  has presented today for surgery, with the diagnosis of anemia.  The various methods of treatment have been discussed with the patient and family. After consideration of risks, benefits and other options for treatment, the patient has consented to  Procedure(s) with comments: COLONOSCOPY WITH PROPOFOL (N/A) - PM as a surgical intervention.  The patient's history has been reviewed, patient examined, no change in status, stable for surgery.  I have reviewed the patient's chart and labs.  Questions were answered to the patient's satisfaction.     Eloise Harman

## 2021-01-27 NOTE — Anesthesia Procedure Notes (Signed)
Date/Time: 01/27/2021 10:23 AM Performed by: Orlie Dakin, CRNA Pre-anesthesia Checklist: Patient identified, Emergency Drugs available, Suction available and Patient being monitored Patient Re-evaluated:Patient Re-evaluated prior to induction Oxygen Delivery Method: Nasal cannula Induction Type: IV induction Placement Confirmation: positive ETCO2

## 2021-01-27 NOTE — Transfer of Care (Signed)
Immediate Anesthesia Transfer of Care Note  Patient: Ronald [name]  Procedure(s) Performed: COLONOSCOPY WITH PROPOFOL (N/A ) POLYPECTOMY INTESTINAL  Patient Location: Short Stay  Anesthesia Type:General  Level of Consciousness: awake, alert  and oriented  Airway & Oxygen Therapy: Patient Spontanous Breathing  Post-op Assessment: Report given to RN and Post -op Vital signs reviewed and stable  Post vital signs: Reviewed and stable  Last Vitals:  Vitals Value Taken Time  BP    Temp    Pulse    Resp    SpO2      Last Pain:  Vitals:   01/27/21 1017  TempSrc:   PainSc: 0-No pain      Patients Stated Pain Goal: 7 (07/28/56 4734)  Complications: No complications documented.

## 2021-01-27 NOTE — Discharge Instructions (Addendum)
Colonoscopy Discharge Instructions  Read the instructions outlined below and refer to this sheet in the next few weeks. These discharge instructions provide you with general information on caring for yourself after you leave the hospital. Your doctor may also give you specific instructions. While your treatment has been planned according to the most current medical practices available, unavoidable complications occasionally occur.   ACTIVITY  You may resume your regular activity, but move at a slower pace for the next 24 hours.   Take frequent rest periods for the next 24 hours.   Walking will help get rid of the air and reduce the bloated feeling in your belly (abdomen).   No driving for 24 hours (because of the medicine (anesthesia) used during the test).    Do not sign any important legal documents or operate any machinery for 24 hours (because of the anesthesia used during the test).  NUTRITION  Drink plenty of fluids.   You may resume your normal diet as instructed by your doctor.   Begin with a light meal and progress to your normal diet. Heavy or fried foods are harder to digest and may make you feel sick to your stomach (nauseated).   Avoid alcoholic beverages for 24 hours or as instructed.  MEDICATIONS  You may resume your normal medications unless your doctor tells you otherwise.  WHAT YOU CAN EXPECT TODAY  Some feelings of bloating in the abdomen.   Passage of more gas than usual.   Spotting of blood in your stool or on the toilet paper.  IF YOU HAD POLYPS REMOVED DURING THE COLONOSCOPY:  No aspirin products for 7 days or as instructed.   No alcohol for 7 days or as instructed.   Eat a soft diet for the next 24 hours.  FINDING OUT THE RESULTS OF YOUR TEST Not all test results are available during your visit. If your test results are not back during the visit, make an appointment with your caregiver to find out the results. Do not assume everything is normal if  you have not heard from your caregiver or the medical facility. It is important for you to follow up on all of your test results.  SEEK IMMEDIATE MEDICAL ATTENTION IF:  You have more than a spotting of blood in your stool.   Your belly is swollen (abdominal distention).   You are nauseated or vomiting.   You have a temperature over 101.   You have abdominal pain or discomfort that is severe or gets worse throughout the day.   Your colonoscopy revealed 4 polyp(s) which I removed successfully. Await pathology results, my office will contact you. I recommend repeating colonoscopy in 3 years for surveillance purposes. Follow up with GI in 4-6 weeks to discuss chronic liver disease.    I hope you have a great rest of your week!  Elon Alas. Abbey Chatters, D.O. Gastroenterology and Hepatology [facility]       Monitored Anesthesia Care, Care After This sheet gives you information about how to care for yourself after your procedure. Your health care provider may also give you more specific instructions. If you have problems or questions, contact your health care provider. What can I expect after the procedure? After the procedure, it is common to have:  Tiredness.  Forgetfulness about what happened after the procedure.  Impaired judgment for important decisions.  Nausea or vomiting.  Some difficulty with balance. Follow these instructions at home: For the time period you were told by  your health care provider:  Rest as needed.  Do not participate in activities where you could fall or become injured.  Do not drive or use machinery.  Do not drink alcohol.  Do not take sleeping pills or medicines that cause drowsiness.  Do not make important decisions or sign legal documents.  Do not take care of children on your own.      Eating and drinking  Follow the diet that is recommended by your health care provider.  Drink enough fluid to keep your  urine pale yellow.  If you vomit: ? Drink water, juice, or soup when you can drink without vomiting. ? Make sure you have little or no nausea before eating solid foods. General instructions  Have a responsible adult stay with you for the time you are told. It is important to have someone help care for you until you are awake and alert.  Take over-the-counter and prescription medicines only as told by your health care provider.  If you have sleep apnea, surgery and certain medicines can increase your risk for breathing problems. Follow instructions from your health care provider about wearing your sleep device: ? Anytime you are sleeping, including during daytime naps. ? While taking prescription pain medicines, sleeping medicines, or medicines that make you drowsy.  Avoid smoking.  Keep all follow-up visits as told by your health care provider. This is important. Contact a health care provider if:  You keep feeling nauseous or you keep vomiting.  You feel light-headed.  You are still sleepy or having trouble with balance after 24 hours.  You develop a rash.  You have a fever.  You have redness or swelling around the IV site. Get help right away if:  You have trouble breathing.  You have new-onset confusion at home. Summary  For several hours after your procedure, you may feel tired. You may also be forgetful and have poor judgment.  Have a responsible adult stay with you for the time you are told. It is important to have someone help care for you until you are awake and alert.  Rest as told. Do not drive or operate machinery. Do not drink alcohol or take sleeping pills.  Get help right away if you have trouble breathing, or if you suddenly become confused. This information is not intended to replace advice given to you by your health care provider. Make sure you discuss any questions you have with your health care provider. Document Revised: 07/01/2020 Document Reviewed:  09/18/2019 Elsevier Patient Education  2021 [employer].

## 2021-01-27 NOTE — Anesthesia Preprocedure Evaluation (Signed)
Anesthesia Evaluation  Patient identified by MRN, date of birth, ID band Patient awake    Reviewed: Allergy & Precautions, NPO status , Patient's Chart, lab work & pertinent test results  History of Anesthesia Complications (+) Family history of anesthesia reaction and history of anesthetic complications  Airway Mallampati: II  TM Distance: >3 FB Neck ROM: Full    Dental  (+) Dental Advisory Given, Chipped   Pulmonary shortness of breath and with exertion, pneumonia, COPD,  COPD inhaler, former smoker,  Left Lung cancer    Pulmonary exam normal breath sounds clear to auscultation       Cardiovascular hypertension, Pt. on medications + CAD  Normal cardiovascular exam Rhythm:Regular Rate:Normal     Neuro/Psych PSYCHIATRIC DISORDERS Depression    GI/Hepatic negative GI ROS, (+) Cirrhosis     substance abuse  alcohol use,   Endo/Other  negative endocrine ROS  Renal/GU negative Renal ROS     Musculoskeletal negative musculoskeletal ROS (+)   Abdominal   Peds  Hematology  (+) anemia ,   Anesthesia Other Findings   Reproductive/Obstetrics negative OB ROS                             Anesthesia Physical Anesthesia Plan  ASA: III  Anesthesia Plan: General   Post-op Pain Management:    Induction: Intravenous  PONV Risk Score and Plan: Propofol infusion  Airway Management Planned: Nasal Cannula and Natural Airway  Additional Equipment:   Intra-op Plan:   Post-operative Plan:   Informed Consent: I have reviewed the patients History and Physical, chart, labs and discussed the procedure including the risks, benefits and alternatives for the proposed anesthesia with the patient or authorized representative who has indicated his/her understanding and acceptance.       Plan Discussed with: CRNA and Surgeon  Anesthesia Plan Comments:         Anesthesia Quick Evaluation

## 2021-01-28 ENCOUNTER — Telehealth: Payer: Self-pay | Admitting: Oncology

## 2021-01-28 LAB — SURGICAL PATHOLOGY

## 2021-01-28 NOTE — Telephone Encounter (Signed)
Reminder call made

## 2021-01-31 ENCOUNTER — Inpatient Hospital Stay: Payer: Medicare Other | Attending: Oncology

## 2021-01-31 ENCOUNTER — Inpatient Hospital Stay: Payer: Medicare Other | Admitting: Oncology

## 2021-01-31 NOTE — Progress Notes (Deleted)
[facility] OFFICE PROGRESS NOTE   Diagnosis: Non-small cell lung cancer  INTERVAL HISTORY:   Ronald [name] was last treated with Taxol/carboplatin and pembrolizumab on 12/24/2020.  He was admitted 01/06/2021 with symptomatic anemia and melena.  An upper endoscopy and colonoscopy revealed no clear source for blood loss.  Objective:  Vital signs in last 24 hours:  There were no vitals taken for this visit.    HEENT: *** Lymphatics: *** Resp: *** Cardio: *** GI: *** Vascular: *** Neuro:***  Skin:***   Portacath/PICC-without erythema  Lab Results:  Lab Results  Component Value Date   WBC 10.0 01/14/2021   HGB 11.4 (L) 01/14/2021   HCT 31.8 (L) 01/14/2021   MCV 99.7 01/14/2021   PLT 274 01/14/2021   NEUTROABS 7,820 (H) 01/14/2021    CMP  Lab Results  Component Value Date   NA 134 (L) 01/14/2021   K 3.9 01/14/2021   CL 99 01/14/2021   CO2 23 01/14/2021   GLUCOSE 122 (H) 01/14/2021   BUN 9 01/14/2021   CREATININE 0.57 (L) 01/14/2021   CALCIUM 8.7 01/14/2021   PROT 5.9 (L) 01/14/2021   ALBUMIN 2.7 (L) 01/05/2021   AST 19 01/14/2021   ALT 12 01/14/2021   ALKPHOS 87 01/05/2021   BILITOT 0.5 01/14/2021   GFRNONAA >60 01/07/2021   GFRAA >60 04/11/2019    No results found for: CEA1  Lab Results  Component Value Date   INR 1.3 (H) 01/14/2021    Imaging:  No results found.  Medications: I have reviewed the patient's current medications.   Assessment/Plan: . Non-small cell lung cancer-squamous cell carcinoma  CT chest 09/19/2018 concerning for an obstructing left lower lobe mass, small left pleural effusion, 4 mm right lower lobe nodule  CT head 09/20/2018-no acute abnormality  Bronchoscopy 10/02/2018-complete occlusion of the left lower lobe with an obstructing mass, biopsies, washing, and brushing reveal squamous cell carcinoma, biopsies of level 7 and 4L nodes-negative  PET scan 10/08/2018-hypermetabolic left hilar mass obstructing the  left lower lobe bronchus with left lower lobe collapse, no evidence of metastatic lymphadenopathy or distant metastases  Clinical stage Ib (T2N0)  Radiation beginning 11/19/2018  Cycle 1 weekly Taxol/carboplatin 11/22/2018  Cycle 2 weekly Taxol/carboplatin 11/29/2018  Cycle 3 weekly Taxol/carboplatin2/04/2019  Cycle 4 weekly Taxol/carboplatin 12/13/2018  Cycle 5 weekly Taxol/carboplatin 12/20/2018  Cycle 6 weekly Taxol/carboplatin 12/27/2018  CT 02/24/2019-stable left lower lobe collapse, decreased left pleural effusion, abnormal soft tissue in the inferior left hilum  Durvalumab 03/28/2019 and 04/11/2019, patient then lost to follow-up  PET 09/30/2020-residual/recurrent disease of the left hilum with associated basilar collapse, slight increase in pleural fluid with small area of nodularity with equivocal uptake at the peripher of consolidated lung, hypermetabolic pancreas head mass, mild asymmetry at the left glossotonsillar region, cirrhosis  10/12/2020 bronchoscopy with cryotherapy, tumor debulking, lesional excision, and balloon dilatation-occluding tumor was noted in the left lower lobe with a small visible opening of the left upper lobe anterior segment, tumor occluded the lingula, no visible opening of the left lower lobe, the left mainstem bifurcation was not recognized due to necrotic tumor, biopsies were taken of level 7 nodes, tumor was debulked from the left upper lobe orifice and cryotherapy/dilation procedures were performed, the pathology revealed squamous cell carcinoma involving an endobronchial biopsy, the biopsy from the level 7 node revealed no malignant cells.  PD-L1 0%  EUS 10/28/2020-no esophageal varices, mild portal gastropathy, no gastric varices, 4.1 cm round mass in the pancreas head, a pancreatic  and common bile duct were nondilated, no peripancreatic adenopathy, FNA biopsy-metastatic squamous cell carcinoma  Cycle 1 Taxol/carboplatin/Pembrolizumab 11/12/2020  Cycle  2 Taxol/carboplatin/Pembrolizumab 12/03/2020, carboplatin dose reduced, Fulphila  Cycle 3 Taxol/carboplatin/pembrolizumab 12/24/2020, Fulphila  CT abdomen/pelvis 01/05/2021-previously demonstrated pancreatic mass appears to have substantially decreased in size from the prior study, ill-defined masslike area adjacent to the pancreatic body and head measuring 4.9 x 5.2 cm which could represent a metastatic lesion versus sequela of pancreatitis, persistent partial collapse of the left lower lobe.  2.Tobacco and alcohol use 3.COPD 4.Weight loss 5.Anemia 6.History of tuberculosis 7.Changes of cirrhosis on the PET scan 10/08/2018 8.  Hypermetabolic pancreas head mass on PET scan 09/30/2020  9.  Hospital admission 01/05/2021-symptomatic anemia, melena  Upper endoscopy 01/06/2021-friable portal gastropathy, no varices, friable edematous mucosa proximal to the major papilla orifice-biopsy lymphangiectasia, no malignancy identified  Colonoscopy 01/27/2021-polyps removed from the descending colon-tubular adenomas     Disposition: ***  Betsy Coder, MD  01/31/2021  8:34 AM

## 2021-02-01 ENCOUNTER — Encounter (HOSPITAL_COMMUNITY): Payer: Self-pay | Admitting: Internal Medicine

## 2021-02-04 ENCOUNTER — Other Ambulatory Visit (HOSPITAL_COMMUNITY): Payer: Medicare Other

## 2021-02-14 ENCOUNTER — Emergency Department (HOSPITAL_COMMUNITY): Payer: Medicare Other

## 2021-02-14 ENCOUNTER — Encounter (HOSPITAL_COMMUNITY): Payer: Self-pay

## 2021-02-14 ENCOUNTER — Inpatient Hospital Stay (HOSPITAL_COMMUNITY)
Admission: EM | Admit: 2021-02-14 | Discharge: 2021-02-16 | DRG: 433 | Disposition: A | Discharge status: Home / Self Care | Payer: Medicare Other | Attending: Internal Medicine | Admitting: Internal Medicine

## 2021-02-14 ENCOUNTER — Other Ambulatory Visit: Payer: Self-pay

## 2021-02-14 DIAGNOSIS — K7460 Unspecified cirrhosis of liver: Secondary | ICD-10-CM | POA: Diagnosis present

## 2021-02-14 DIAGNOSIS — R188 Other ascites: Secondary | ICD-10-CM | POA: Diagnosis present

## 2021-02-14 DIAGNOSIS — K7031 Alcoholic cirrhosis of liver with ascites: Secondary | ICD-10-CM | POA: Diagnosis not present

## 2021-02-14 DIAGNOSIS — J449 Chronic obstructive pulmonary disease, unspecified: Secondary | ICD-10-CM | POA: Diagnosis present

## 2021-02-14 DIAGNOSIS — K922 Gastrointestinal hemorrhage, unspecified: Secondary | ICD-10-CM | POA: Diagnosis present

## 2021-02-14 DIAGNOSIS — Z8249 Family history of ischemic heart disease and other diseases of the circulatory system: Secondary | ICD-10-CM

## 2021-02-14 DIAGNOSIS — E876 Hypokalemia: Secondary | ICD-10-CM | POA: Diagnosis present

## 2021-02-14 DIAGNOSIS — F1020 Alcohol dependence, uncomplicated: Secondary | ICD-10-CM | POA: Diagnosis not present

## 2021-02-14 DIAGNOSIS — I10 Essential (primary) hypertension: Secondary | ICD-10-CM | POA: Diagnosis present

## 2021-02-14 DIAGNOSIS — Z20822 Contact with and (suspected) exposure to covid-19: Secondary | ICD-10-CM | POA: Diagnosis present

## 2021-02-14 DIAGNOSIS — E440 Moderate protein-calorie malnutrition: Secondary | ICD-10-CM | POA: Diagnosis present

## 2021-02-14 DIAGNOSIS — Z6824 Body mass index (BMI) 24.0-24.9, adult: Secondary | ICD-10-CM

## 2021-02-14 DIAGNOSIS — I2510 Atherosclerotic heart disease of native coronary artery without angina pectoris: Secondary | ICD-10-CM | POA: Diagnosis present

## 2021-02-14 DIAGNOSIS — D500 Iron deficiency anemia secondary to blood loss (chronic): Secondary | ICD-10-CM | POA: Diagnosis present

## 2021-02-14 DIAGNOSIS — C3492 Malignant neoplasm of unspecified part of left bronchus or lung: Secondary | ICD-10-CM | POA: Diagnosis not present

## 2021-02-14 DIAGNOSIS — Z8041 Family history of malignant neoplasm of ovary: Secondary | ICD-10-CM

## 2021-02-14 DIAGNOSIS — C7889 Secondary malignant neoplasm of other digestive organs: Secondary | ICD-10-CM | POA: Diagnosis present

## 2021-02-14 DIAGNOSIS — Z87891 Personal history of nicotine dependence: Secondary | ICD-10-CM

## 2021-02-14 LAB — COMPREHENSIVE METABOLIC PANEL
ALT: 12 U/L (ref 0–44)
AST: 23 U/L (ref 15–41)
Albumin: 2.2 g/dL — ABNORMAL LOW (ref 3.5–5.0)
Alkaline Phosphatase: 118 U/L (ref 38–126)
Anion gap: 10 (ref 5–15)
BUN: 12 mg/dL (ref 8–23)
CO2: 24 mmol/L (ref 22–32)
Calcium: 7.9 mg/dL — ABNORMAL LOW (ref 8.9–10.3)
Chloride: 98 mmol/L (ref 98–111)
Creatinine, Ser: 0.89 mg/dL (ref 0.61–1.24)
GFR, Estimated: >60 mL/min (ref >60)
Glucose, Bld: 113 mg/dL — ABNORMAL HIGH (ref 70–99)
Potassium: 3.1 mmol/L — ABNORMAL LOW (ref 3.5–5.1)
Sodium: 132 mmol/L — ABNORMAL LOW (ref 135–145)
Total Bilirubin: 0.6 mg/dL (ref 0.3–1.2)
Total Protein: 5.9 g/dL — ABNORMAL LOW (ref 6.5–8.1)

## 2021-02-14 LAB — LIPASE, BLOOD: Lipase: 217 U/L — ABNORMAL HIGH (ref 11–51)

## 2021-02-14 LAB — CBC WITH DIFFERENTIAL/PLATELET
Abs Immature Granulocytes: 0.04 10*3/uL (ref 0.00–0.07)
Basophils Absolute: 0 10*3/uL (ref 0.0–0.1)
Basophils Relative: 0 %
Eosinophils Absolute: 0.1 10*3/uL (ref 0.0–0.5)
Eosinophils Relative: 1 %
HCT: 33.1 % — ABNORMAL LOW (ref 39.0–52.0)
Hemoglobin: 11 g/dL — ABNORMAL LOW (ref 13.0–17.0)
Immature Granulocytes: 0 %
Lymphocytes Relative: 10 %
Lymphs Abs: 1 10*3/uL (ref 0.7–4.0)
MCH: 33.3 pg (ref 26.0–34.0)
MCHC: 33.2 g/dL (ref 30.0–36.0)
MCV: 100.3 fL — ABNORMAL HIGH (ref 80.0–100.0)
Monocytes Absolute: 1 10*3/uL (ref 0.1–1.0)
Monocytes Relative: 10 %
Neutro Abs: 8 10*3/uL — ABNORMAL HIGH (ref 1.7–7.7)
Neutrophils Relative %: 79 %
Platelets: 280 10*3/uL (ref 150–400)
RBC: 3.3 MIL/uL — ABNORMAL LOW (ref 4.22–5.81)
RDW: 14.6 % (ref 11.5–15.5)
WBC: 10.2 10*3/uL (ref 4.0–10.5)
nRBC: 0 % (ref 0.0–0.2)

## 2021-02-14 LAB — PROTIME-INR
INR: 2.1 — ABNORMAL HIGH (ref 0.8–1.2)
Prothrombin Time: 23.6 seconds — ABNORMAL HIGH (ref 11.4–15.2)

## 2021-02-14 LAB — AMMONIA: Ammonia: 17 umol/L (ref 9–35)

## 2021-02-14 LAB — ETHANOL: Alcohol, Ethyl (B): <10 mg/dL (ref <10)

## 2021-02-14 LAB — MAGNESIUM: Magnesium: 1.3 mg/dL — ABNORMAL LOW (ref 1.7–2.4)

## 2021-02-14 MED ORDER — IOHEXOL 300 MG/ML  SOLN
100.0000 mL | Freq: Once | INTRAMUSCULAR | Status: AC | PRN
  Administered 2021-02-14: 100 mL via INTRAVENOUS

## 2021-02-14 MED ORDER — ENOXAPARIN SODIUM 40 MG/0.4ML ~~LOC~~ SOLN
40.0000 mg | SUBCUTANEOUS | Status: DC
  Administered 2021-02-14 – 2021-02-15 (×2): 40 mg via SUBCUTANEOUS
  Filled 2021-02-14 (×2): qty 0.4

## 2021-02-14 MED ORDER — ONDANSETRON HCL 4 MG/2ML IJ SOLN: 4.0000 mg | Freq: Four times a day (QID) | INTRAMUSCULAR | Status: DC | PRN

## 2021-02-14 MED ORDER — LORAZEPAM 1 MG PO TABS
1.0000 mg | ORAL_TABLET | ORAL | Status: DC | PRN
Start: 2021-02-14 — End: 2021-02-16

## 2021-02-14 MED ORDER — FLUTICASONE FUROATE-VILANTEROL 100-25 MCG/INH IN AEPB
1.0000 | INHALATION_SPRAY | Freq: Every day | RESPIRATORY_TRACT | Status: DC
  Administered 2021-02-15 – 2021-02-16 (×2): 1 via RESPIRATORY_TRACT
  Filled 2021-02-14: qty 28

## 2021-02-14 MED ORDER — THIAMINE HCL 100 MG/ML IJ SOLN
100.0000 mg | Freq: Every day | INTRAMUSCULAR | Status: DC
  Filled 2021-02-14: qty 2

## 2021-02-14 MED ORDER — POLYETHYLENE GLYCOL 3350 17 G PO PACK: 17.0000 g | PACK | Freq: Every day | ORAL | Status: DC | PRN

## 2021-02-14 MED ORDER — MIRTAZAPINE 15 MG PO TABS
7.5000 mg | ORAL_TABLET | Freq: Every day | ORAL | Status: DC
  Administered 2021-02-14 – 2021-02-15 (×2): 7.5 mg via ORAL
  Filled 2021-02-14 (×2): qty 1

## 2021-02-14 MED ORDER — POTASSIUM CHLORIDE CRYS ER 20 MEQ PO TBCR
40.0000 meq | EXTENDED_RELEASE_TABLET | ORAL | Status: AC
  Administered 2021-02-14 (×2): 40 meq via ORAL
  Filled 2021-02-14: qty 2

## 2021-02-14 MED ORDER — ADULT MULTIVITAMIN W/MINERALS CH
1.0000 | ORAL_TABLET | Freq: Every day | ORAL | Status: DC
  Administered 2021-02-15 – 2021-02-16 (×2): 1 via ORAL
  Filled 2021-02-14 (×2): qty 1

## 2021-02-14 MED ORDER — FOLIC ACID 1 MG PO TABS
1.0000 mg | ORAL_TABLET | Freq: Every day | ORAL | Status: DC
  Administered 2021-02-15 – 2021-02-16 (×2): 1 mg via ORAL
  Filled 2021-02-14 (×2): qty 1

## 2021-02-14 MED ORDER — LORAZEPAM 2 MG/ML IJ SOLN: 1.0000 mg | INTRAMUSCULAR | Status: DC | PRN

## 2021-02-14 MED ORDER — THIAMINE HCL 100 MG PO TABS
100.0000 mg | ORAL_TABLET | Freq: Every day | ORAL | Status: DC
  Administered 2021-02-15 – 2021-02-16 (×2): 100 mg via ORAL
  Filled 2021-02-14 (×2): qty 1

## 2021-02-14 MED ORDER — TRAMADOL HCL 50 MG PO TABS: 50.0000 mg | ORAL_TABLET | Freq: Three times a day (TID) | ORAL | Status: DC | PRN

## 2021-02-14 MED ORDER — ONDANSETRON HCL 4 MG PO TABS: 4.0000 mg | ORAL_TABLET | Freq: Four times a day (QID) | ORAL | Status: DC | PRN

## 2021-02-14 MED ORDER — UMECLIDINIUM BROMIDE 62.5 MCG/INH IN AEPB
1.0000 | INHALATION_SPRAY | Freq: Every day | RESPIRATORY_TRACT | Status: DC
  Administered 2021-02-15 – 2021-02-16 (×2): 1 via RESPIRATORY_TRACT
  Filled 2021-02-14: qty 7

## 2021-02-14 NOTE — ED Notes (Signed)
Called lab to add-on magnesium.

## 2021-02-14 NOTE — ED Notes (Signed)
Pt out of room for testing.

## 2021-02-14 NOTE — ED Provider Notes (Signed)
[facility] EMERGENCY DEPARTMENT Provider Note   CSN: [id number] Arrival date & time: 02/14/21  1208     History Chief Complaint  Patient presents with  . Bloated    Ronald [name] is a 65 y.o. male presents to ED for evaluation of abdominal bloating for 2-3 weeks. Constant, gradually worsening.  States every time he eats his abdomen swells up more.  Very uncomfortable now especially when trying to sleep. Associated with mild discomfort in lower abdomen.  Denies fevers, chills, nausea, vomiting, changes in BM. No dark urine, dysuria.  No chest pain, shortness of breath, leg swelling.  Reports history of "a touch of cirrhosis" but isn't really sure what this means.  Used to drink 6-7 beers daily, last drink was 2 weeks ago.  Infrequent use of tylenol. States he has always bruised easily but denies bleeding from gums, blood in urine, nose bleeds. Followed by gastroenterology, recently had EGD and colonoscopy. 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   HPI     Past Medical History:  Diagnosis Date  . Alcoholism ([place])   . Anemia    iron deficiency  . Cancer ([place])    lung Stage I- 10/11/20  . Collapse of left lung    lower lobe  . COPD (chronic obstructive pulmonary disease) ([place])   . Depression   . Dyspnea    Exertion  . Family history of adverse reaction to anesthesia    sister had a bad headache  . Hypertension    10/11/20 - not in years  . Mediastinal adenopathy     and endobronchial lesion  . Pneumonia   . TB (pulmonary tuberculosis)   . Wears dentures     Patient Active Problem List   Diagnosis Date Noted  . Ascites 02/14/2021  . Hepatic cirrhosis ([place]) 01/12/2021  . Symptomatic anemia 01/05/2021  . GI bleed 01/05/2021  . Syncope 01/05/2021  . Goals of care, counseling/discussion 11/03/2020  . S/P bronchoscopy 10/14/2020  . Endobronchial cancer, left ([place])   .  Squamous cell carcinoma lung, left ([place]) 05/01/2019  . Lung cancer ([place]) 11/05/2018  . Mediastinal adenopathy   . Thrush, oral 09/27/2018  . Protein-calorie malnutrition, severe 09/23/2018  . Abnormal ECG 09/20/2018  . CAD (coronary artery disease) 09/20/2018  . Obstructive pneumonia 09/19/2018  . Abnormal LFTs 09/19/2018  . Hyponatremia 09/19/2018  . Stage 3 severe COPD by GOLD classification ([place]) 09/19/2018  . Hypertension 09/19/2018  . Alcoholism ([place]) 09/19/2018  . Anemia 09/19/2018  . Lung mass 09/19/2018    Past Surgical History:  Procedure Laterality Date  . BIOPSY  01/06/2021   Procedure: BIOPSY;  Surgeon: Milus Banister, MD;  Location: [facility];  Service: Endoscopy;;  . COLONOSCOPY WITH PROPOFOL N/A 01/27/2021   Procedure: COLONOSCOPY WITH PROPOFOL;  Surgeon: Eloise Harman, DO;  [facility];  Service: Endoscopy;  Laterality: N/A;  PM  . ESOPHAGOGASTRODUODENOSCOPY (EGD) WITH PROPOFOL N/A 10/28/2020   Procedure: ESOPHAGOGASTRODUODENOSCOPY (EGD) WITH PROPOFOL;  Surgeon: Milus Banister, MD;  Location: [facility];  Service: Endoscopy;  Laterality: N/A;  . ESOPHAGOGASTRODUODENOSCOPY (EGD) WITH PROPOFOL N/A 01/06/2021   Procedure: ESOPHAGOGASTRODUODENOSCOPY (EGD) WITH PROPOFOL;  Surgeon: Milus Banister, MD;  Location: [facility];  Service: Endoscopy;  Laterality: N/A;  . EUS N/A 10/28/2020   Procedure: UPPER ENDOSCOPIC ULTRASOUND (EUS) RADIAL;  Surgeon: Milus Banister, MD;  Location: [facility];  Service: Endoscopy;  Laterality:  N/A;  . FINE NEEDLE ASPIRATION N/A 10/28/2020   Procedure: FINE NEEDLE ASPIRATION (FNA) LINEAR;  Surgeon: Milus Banister, MD;  Location: [facility];  Service: Endoscopy;  Laterality: N/A;  . MULTIPLE TOOTH EXTRACTIONS    . POLYPECTOMY  01/27/2021   Procedure: POLYPECTOMY INTESTINAL;  Surgeon: Eloise Harman, DO;  [facility];  Service: Endoscopy;;  . VIDEO BRONCHOSCOPY N/A 10/12/2020   Procedure: VIDEO  BRONCHOSCOPY WITH CRYOTHERAPY AND BALLOON DILATATION;  Surgeon: Garner Nash, DO;  Location: [facility];  Service: Pulmonary;  Laterality: N/A;  . VIDEO BRONCHOSCOPY WITH ENDOBRONCHIAL ULTRASOUND N/A 10/02/2018   Procedure: VIDEO BRONCHOSCOPY WITH ENDOBRONCHIAL ULTRASOUND;  Surgeon: Garner Nash, DO;  Location: [facility];  Service: Thoracic;  Laterality: N/A;  . VIDEO BRONCHOSCOPY WITH ENDOBRONCHIAL ULTRASOUND N/A 10/12/2020   Procedure: VIDEO BRONCHOSCOPY WITH ENDOBRONCHIAL ULTRASOUND;  Surgeon: Garner Nash, DO;  Location: [facility];  Service: Pulmonary;  Laterality: N/A;       Family History  Problem Relation Age of Onset  . Heart disease Mother   . Heart disease Father   . Diabetes Other   . Hypertension Other   . Ovarian cancer Maternal Grandmother     Social History   Tobacco Use  . Smoking status: Former Smoker    Packs/day: 2.00    Types: Cigarettes    Quit date: 09/19/2018    Years since quitting: 2.4  . Smokeless tobacco: Current User    Types: Snuff  Vaping Use  . Vaping Use: Never used  Substance Use Topics  . Alcohol use: Yes    Alcohol/week: 4.0 standard drinks    Types: 4 Cans of beer per week    Comment: occas  . Drug use: No    Home Medications Prior to Admission medications   Medication Sig Start Date End Date Taking? Authorizing Provider  albuterol (PROVENTIL) (2.5 MG/3ML) 0.083% nebulizer solution TAKE 3ML BY MOUTH VIA NEBULIZER EVERY 4 HOURS AS NEEDED FOR WHEEZING OR SHORTNESS OF BREATH Patient taking differently: Take 2.5 mg by nebulization every 4 (four) hours as needed (wheezing/shortness of breath.). 09/15/20   Icard, Octavio Graves, DO  albuterol (VENTOLIN HFA) 108 (90 Base) MCG/ACT inhaler INHALE 2 PUFFS BY MOUTH EVERY 6 HOURS AS NEEDED FOR SHORTNESS OF BREATH Patient taking differently: Inhale 2 puffs into the lungs every 6 (six) hours as needed for shortness of breath. 12/20/20   Icard, Leory Plowman L, DO  ascorbic acid (VITAMIN C) 500 MG tablet Take 500  mg by mouth daily.    [provider]  cholecalciferol (VITAMIN D3) 25 MCG (1000 UNIT) tablet Take 1,000 Units by mouth daily.    [provider]  diphenoxylate-atropine (LOMOTIL) 2.5-0.025 MG tablet Take 1-2 tablets by mouth 4 (four) times daily as needed for diarrhea or loose stools (Maximum of 8 tablets/day). 01/12/21   Ladell Pier, MD  feeding supplement, ENSURE ENLIVE, (ENSURE ENLIVE) LIQD Take 237 mLs by mouth 2 (two) times daily between meals. Patient taking differently: No sig reported 09/24/18   Florencia Reasons, MD  fexofenadine (ALLEGRA) 180 MG tablet Take 180 mg by mouth daily.    [provider]  Fluticasone-Umeclidin-Vilant (TRELEGY ELLIPTA) 100-62.5-25 MCG/INH AEPB Inhale 1 puff into the lungs daily. 12/20/20   Icard, Octavio Graves, DO  mirtazapine (REMERON) 7.5 MG tablet Take 1 tablet (7.5 mg total) by mouth at bedtime. Patient taking differently: Take 7.5 mg by mouth at bedtime as needed (sleep). 12/24/20   Ladell Pier, MD  polyethylene  glycol-electrolytes (TRILYTE) 420 g solution Take 4,000 mLs by mouth as directed. 01/13/21   Eloise Harman, DO  potassium chloride SA (KLOR-CON) 20 MEQ tablet Take 1 tablet (20 mEq total) by mouth 2 (two) times daily. 01/07/21   Little Ishikawa, MD    Allergies    Patient has no known allergies.  Review of Systems   Review of Systems  Gastrointestinal: Positive for abdominal distention and abdominal pain.  All other systems reviewed and are negative.   Physical Exam Updated Vital Signs BP 103/78   Pulse (!) 104   Temp 98.3 F (36.8 C) (Oral)   Resp (!) 21   Ht 5\' 4"  (1.626 m)   Wt 64 kg   SpO2 96%   BMI 24.20 kg/m   Physical Exam Vitals and nursing note reviewed.  Constitutional:      Appearance: He is well-developed.     Comments: Non toxic.  HENT:     Head: Normocephalic and atraumatic.     Nose: Nose normal.  Eyes:     Conjunctiva/sclera: Conjunctivae normal.  Cardiovascular:     Rate and  Rhythm: Normal rate and regular rhythm.     Heart sounds: Normal heart sounds.     Comments: No lower extremity edema Pulmonary:     Effort: Pulmonary effort is normal.     Breath sounds: Normal breath sounds.     Comments: Breathing is even and unlabored, lungs clear without crackles, wheezing Abdominal:     General: Bowel sounds are normal. There is distension.     Palpations: Abdomen is soft.     Tenderness: There is abdominal tenderness. There is guarding.     Comments: TTP mild with deep palpation in epigastric and RUQ, suprapubic area with guarding.  Marked distention with fluid wave. Umbilical hernia, reducible and non tender. no erythema, warmth of abdominal wall skin. No CVAT  Musculoskeletal:        General: Normal range of motion.     Cervical back: Normal range of motion.  Skin:    General: Skin is warm and dry.     Capillary Refill: Capillary refill takes less than 2 seconds.  Neurological:     Mental Status: He is alert.  Psychiatric:        Behavior: Behavior normal.     ED Results / Procedures / Treatments   Labs (all labs ordered are listed, but only abnormal results are displayed) Labs Reviewed  CBC WITH DIFFERENTIAL/PLATELET - Abnormal; Notable for the following components:      Result Value   RBC 3.30 (*)    Hemoglobin 11.0 (*)    HCT 33.1 (*)    MCV 100.3 (*)    Neutro Abs 8.0 (*)    All other components within normal limits  COMPREHENSIVE METABOLIC PANEL - Abnormal; Notable for the following components:   Sodium 132 (*)    Potassium 3.1 (*)    Glucose, Bld 113 (*)    Calcium 7.9 (*)    Total Protein 5.9 (*)    Albumin 2.2 (*)    All other components within normal limits  LIPASE, BLOOD - Abnormal; Notable for the following components:   Lipase 217 (*)    All other components within normal limits  PROTIME-INR - Abnormal; Notable for the following components:   Prothrombin Time 23.6 (*)    INR 2.1 (*)    All other components within normal limits   SARS CORONAVIRUS 2 (TAT 6-24 HRS)  ETHANOL  AMMONIA  URINALYSIS, ROUTINE W REFLEX MICROSCOPIC  MAGNESIUM    EKG EKG Interpretation  Date/Time:  Monday February 14 2021 15:12:49 EDT Ventricular Rate:  103 PR Interval:  185 QRS Duration: 76 QT Interval:  328 QTC Calculation: 430 R Axis:   83 Text Interpretation: Sinus tachycardia Borderline right axis deviation Low voltage, extremity and precordial leads Nonspecific T abnormalities, lateral leads Confirmed by Aletta Edouard [phone]) on 02/14/2021 4:09:31 PM   Radiology CT ABDOMEN PELVIS W CONTRAST  Result Date: 02/14/2021 CLINICAL DATA:  Abdominal distension, elevated lipase. History of metastatic lung cancer. EXAM: CT ABDOMEN AND PELVIS WITH CONTRAST TECHNIQUE: Multidetector CT imaging of the abdomen and pelvis was performed using the standard protocol following bolus administration of intravenous contrast. CONTRAST:  100 mL Omnipaque 300 intravenously. COMPARISON:  January 05, 2021. FINDINGS: Lower chest: Mild left basilar atelectasis or scarring is noted. Hepatobiliary: No gallstones are noted. Hepatic cirrhosis is noted. No biliary dilatation is noted. Pancreas: No ductal dilatation is noted. 3.8 x 3.3 cm complex but predominantly low density area is seen anterior and inferior to the pancreatic body which currently measures 3.8 x 3.3 cm which is decreased compared to prior exam. It is uncertain if this represents mass or possibly pseudocyst. Mass involving the inferior portion of pancreatic head is stable compared to prior exam. Spleen: Normal in size without focal abnormality. Adrenals/Urinary Tract: Adrenal glands appear normal. Stable right renal cyst is noted. No hydronephrosis or renal obstruction is noted. Urinary bladder is unremarkable. Stomach/Bowel: Stomach is within normal limits. Appendix appears normal. No evidence of bowel wall thickening, distention, or inflammatory changes. Vascular/Lymphatic: Aortic atherosclerosis. No enlarged  abdominal or pelvic lymph nodes. Reproductive: Prostate is unremarkable. Other: Interval development of mild to moderate ascites is noted in the pelvis and around the liver and spleen. Musculoskeletal: No acute or significant osseous findings. IMPRESSION: Interval development of mild to moderate ascites is noted. Hepatic cirrhosis is noted. Stable appearance of pancreatic head mass. 3.8 x 3.3 cm complex but predominantly low density area is seen anterior and inferior to the pancreatic body which currently measures 3.8 x 3.3 cm which is decreased compared to prior exam. It is uncertain if this represents mass or possibly pseudocyst. Aortic Atherosclerosis (ICD10-I70.0). Electronically Signed   By: Marijo Conception M.D.   On: 02/14/2021 15:55    Procedures Procedures   Medications Ordered in ED Medications  iohexol (OMNIPAQUE) 300 MG/ML solution 100 mL (100 mLs Intravenous Contrast Given 02/14/21 1543)    ED Course  I have reviewed the triage vital signs and the nursing notes.  Pertinent labs & imaging results that were available during my care of the patient were reviewed by me and considered in my medical decision making (see chart for details).  Clinical Course as of 02/14/21 1805  Mon Feb 14, 2021  1316 Upper EGD Mar 2022 Impression:                - GE junction was slightly snug but the mucosa was normal throughout the esophagus.  - Spontaneously friable portal gastropathy changes throughout the stomach.  - No esophageal or gastric varices.  - The duodenal mucosa just proximal to the major papilla orifice was unusual appearing for about 3cm (friable, edematous, somewhat papillary appearing). This was best seen with side viewing duodenoscopy.   It was not clearly neoplastic but with the known squamous cell metastatic mass directly deep to this mucosa in the head of the pancreas I took extensivenmucosal biospies. [CG]  1353 Colonoscopy Mar 2022 Impression:               - Non-bleeding  internal hemorrhoids. - Four 5 to 8 mm polyps in the descending colon, removed with a cold snare. Resected and retrieved [CG]  1354 CT [facility] Mar 2022  IMPRESSION: 1. There are air-fluid levels throughout the colon, which can be seen in patients with diarrhea. 2. The previously demonstrated pancreatic mass appears to have substantially decreased in size from the prior study. There is an ill-defined masslike area adjacent to the pancreatic body and head measuring approximately 4.9 x 5.2 cm. This could represent a metastatic lesion versus sequela of pancreatitis. 3. Persistent partial collapse of the left lower lobe. 4. Trace ascites.  Hepatobiliary: The liver is normal. Normal gallbladder.There is apparent ductal dilatation of the distal common bile duct [CG]  1447 Pulse Rate(!): 107 [CG]  1448 Lipase(!): 217 [CG]  1448 INR(!): 2.1 [CG]  1448 Hemoglobin(!): 11.0 [CG]  1448 WBC: 10.2 [CG]  1448 Potassium(!): 3.1 [CG]  1448 Albumin(!): 2.2 [CG]  1448 AST: 23 [CG]  1448 ALT: 12 [CG]  1448 Alkaline Phosphatase: 118 [CG]  1448 Alcohol, Ethyl (B): <10 [CG]  1448 Ammonia: 17 [CG]  1603 CT ABDOMEN PELVIS W CONTRAST IMPRESSION: Interval development of mild to moderate ascites is noted.  Hepatic cirrhosis is noted.  Stable appearance of pancreatic head mass.  3.8 x 3.3 cm complex but predominantly low density area is seen anterior and inferior to the pancreatic body which currently measures 3.8 x 3.3 cm which is decreased compared to prior exam. It is uncertain if this represents mass or possibly pseudocyst.  Aortic Atherosclerosis (ICD10-I70.0). [CG]    Clinical Course User Index [CG] Arlean Hopping   MDM Rules/Calculators/A&P                          65 yo M presents for abdominal distention for 3 weeks. Pertinent PMH includes alcohol abuse, lung cancer with metastasis for pancreas, recently diagnosed cirrhosis.   EMR triage and nursing notes reviewed  Had recent upper  EGD and colonoscopy as above - no esophageal or gastric varices, portal gastropathy and abnormal mucosa in duodenum that was normal on biopsy   Ddx - biliary obstruction vs cirrhotic ascites.  No fever, chills and exam not concerning for SBP.   Labs imaging ordered and personally interpreted by me  Labs reveal - normal WBC. Stable Hemoglobin. Lipase 111 > 217. He has diffuse abdominal tenderness.  LFT normal. Albumin 2.2. PT INR 23.6/2.1. Ammonia normal. K is 2.1.    Imaging reveals - CTAP with interval development of mild/moderate ascites, hepatic cirrhosis, stable pancreatic mass, improved size of density in pancreatic body from previous ?mass vs pseudocyst.  Consults in the ED - GI spoke to Dr Abbey Chatters agrees with admission, GI will see in the AM. Discussed with hospitalist who will admit. Will benefit from GI evaluation, diuresis, starting medicines for cirrhosis, IR for paracentesis. Discussed with EDP.  Final Clinical Impression(s) / ED Diagnoses Final diagnoses:  Ascites due to alcoholic cirrhosis [facility])    Rx / DC Orders ED Discharge Orders    None       Arlean Hopping 02/14/21 1805    Luna Fuse, MD 02/18/21 [phone]

## 2021-02-14 NOTE — TOC Initial Note (Signed)
Transition of Care [facility]) - Initial/Assessment Note    Patient Details  Name: Ronald [name] MRN: [id number] Date of Birth: 1956/10/07  Transition of Care [facility]) CM/SW Contact:    Iona Beard, [place] Phone Number: 02/14/2021, 8:21 PM  Clinical Narrative:                 [facility] consulted for substance use education. CSW visited pt in room to complete assessment. Pt states he lives alone. Pt can complete ADLs independently. Pt does not drive but has transportation provided when needed. Pt has not had [place] services. Pt has a rollator to use when needed. Pt states that he used to drink. CSW inquired about pts interest in substance use resources, pt states he will think about it. TOC to follow.   Expected Discharge Plan: Home/Self Care Barriers to Discharge: Continued Medical Work up   Patient Goals and CMS Choice Patient states their goals for this hospitalization and ongoing recovery are:: Return home CMS Medicare.gov Compare Post Acute Care list provided to:: Patient Choice offered to / list presented to : Patient  Expected Discharge Plan and Services Expected Discharge Plan: Home/Self Care In-house Referral: Clinical Social Work Discharge Planning Services: CM Consult Post Acute Care Choice: NA Living arrangements for the past 2 months: Single Family Home                                      Prior Living Arrangements/Services Living arrangements for the past 2 months: Single Family Home Lives with:: Self Patient language and need for interpreter reviewed:: Yes Do you feel safe going back to the place where you live?: Yes      Need for Family Participation in Patient Care: No (Comment) Care giver support system in place?: Yes (comment) Current home services: DME Criminal Activity/Legal Involvement Pertinent to Current Situation/Hospitalization: No - Comment as needed  Activities of Daily Living Home Assistive Devices/Equipment: Gilford Rile (specify type) ADL Screening  (condition at time of admission) Patient's cognitive ability adequate to safely complete daily activities?: Yes Is the patient deaf or have difficulty hearing?: No Does the patient have difficulty seeing, even when wearing glasses/contacts?: No Does the patient have difficulty concentrating, remembering, or making decisions?: No Patient able to express need for assistance with ADLs?: Yes Does the patient have difficulty dressing or bathing?: Yes Independently performs ADLs?: No Communication: Independent Dressing (OT): Needs assistance Is this a change from baseline?: Pre-admission baseline Grooming: Needs assistance Is this a change from baseline?: Pre-admission baseline Feeding: Independent Bathing: Needs assistance Is this a change from baseline?: Pre-admission baseline Toileting: Needs assistance Is this a change from baseline?: Pre-admission baseline In/Out Bed: Needs assistance Is this a change from baseline?: Pre-admission baseline Walks in Home: Independent Does the patient have difficulty walking or climbing stairs?: Yes Weakness of Legs: None Weakness of Arms/Hands: None  Permission Sought/Granted                  Emotional Assessment Appearance:: Appears stated age Attitude/Demeanor/Rapport: Engaged Affect (typically observed): Accepting Orientation: : Oriented to Self,Oriented to Place,Oriented to  Time,Oriented to Situation Alcohol / Substance Use: Alcohol Use Psych Involvement: No (comment)  Admission diagnosis:  Ascites [R18.8] Ascites due to alcoholic cirrhosis ([place]) [Q11.94] Patient Active Problem List   Diagnosis Date Noted  . Ascites 02/14/2021  . Hepatic cirrhosis ([place]) 01/12/2021  . Symptomatic anemia 01/05/2021  . GI bleed 01/05/2021  .  Syncope 01/05/2021  . Goals of care, counseling/discussion 11/03/2020  . S/P bronchoscopy 10/14/2020  . Endobronchial cancer, left ([place])   . Squamous cell carcinoma lung, left ([place]) 05/01/2019  . Lung cancer  ([place]) 11/05/2018  . Mediastinal adenopathy   . Thrush, oral 09/27/2018  . Protein-calorie malnutrition, severe 09/23/2018  . Abnormal ECG 09/20/2018  . CAD (coronary artery disease) 09/20/2018  . Obstructive pneumonia 09/19/2018  . Abnormal LFTs 09/19/2018  . Hyponatremia 09/19/2018  . Stage 3 severe COPD by GOLD classification ([place]) 09/19/2018  . Hypertension 09/19/2018  . Alcoholism ([place]) 09/19/2018  . Anemia 09/19/2018  . Lung mass 09/19/2018   PCP:  [facility]:   [place], [place] - [address] [place] [address] Phone: [phone] Fax: [phone]     Social Determinants of Health (SDOH) Interventions    Readmission Risk Interventions No flowsheet data found.

## 2021-02-14 NOTE — ED Triage Notes (Signed)
Appears jaundiced.

## 2021-02-14 NOTE — H&P (Signed)
History and Physical    Ronald [name] SWN:[id number] DOB: 03/23/1956 DOA: 02/14/2021  PCP: [facility]   Patient coming from: Home  I have personally briefly reviewed patient's old medical records in [place]  Chief Complaint: Abdominal distension  HPI: Ronald [name] is a 65 y.o. 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 Patient presented to the ED with complaints of increasing abdominal distention ongoing for the past 3 weeks.  Reports progressive increase in distention with each meal when he eats.  Reports some mild lower abdominal pain.  Denies fevers, no vomiting, no loose stools.  Appetite okay.  Last bowel movement was this morning and was normal consistency.  He denies difficulty breathing both at rest and with exertion, and even when lying on his back. He has a chronic unchanged cough.  No pain with urination.  Recent hospitalization 3/9-3/11 for symptomatic anemia secondary to GI bleed, EGD unremarkable for any bleed but showed- friable portal gastropathy, friable and edematous duodenal mucosa.  Patient deferred colonoscopy for outpatient.  Since discharge he has not had any black stools.  ED Course: Heart rate 113, temperature 98.1.  Respiratory rate 18-22.  Blood pressure systolic 93-818.  O2 sats greater than 96% on room air.  Potassium 3.1.  INR 2.1.  Blood alcohol level less than 10.  Ammonia 17.  Abdominal CT with contrast showed mild to moderate ascites, stable pancreatic head mass, hepatic cirrhosis. EDP talked to patient gastroenterologist Dr. Abbey Chatters, recommended admission, as this is a new diagnosis.  Review of Systems: As per HPI all other systems reviewed and negative.  Past Medical History:  Diagnosis Date  . Alcoholism ([place])   . Anemia    iron deficiency  . Cancer ([place])    lung Stage I- 10/11/20  . Collapse of left lung    lower lobe  . COPD (chronic  obstructive pulmonary disease) ([place])   . Depression   . Dyspnea    Exertion  . Family history of adverse reaction to anesthesia    sister had a bad headache  . Hypertension    10/11/20 - not in years  . Mediastinal adenopathy     and endobronchial lesion  . Pneumonia   . TB (pulmonary tuberculosis)   . Wears dentures     Past Surgical History:  Procedure Laterality Date  . BIOPSY  01/06/2021   Procedure: BIOPSY;  Surgeon: Milus Banister, MD;  Location: [facility];  Service: Endoscopy;;  . COLONOSCOPY WITH PROPOFOL N/A 01/27/2021   Procedure: COLONOSCOPY WITH PROPOFOL;  Surgeon: Eloise Harman, DO;  [facility];  Service: Endoscopy;  Laterality: N/A;  PM  . ESOPHAGOGASTRODUODENOSCOPY (EGD) WITH PROPOFOL N/A 10/28/2020   Procedure: ESOPHAGOGASTRODUODENOSCOPY (EGD) WITH PROPOFOL;  Surgeon: Milus Banister, MD;  Location: [facility];  Service: Endoscopy;  Laterality: N/A;  . ESOPHAGOGASTRODUODENOSCOPY (EGD) WITH PROPOFOL N/A 01/06/2021   Procedure: ESOPHAGOGASTRODUODENOSCOPY (EGD) WITH PROPOFOL;  Surgeon: Milus Banister, MD;  Location: [facility];  Service: Endoscopy;  Laterality: N/A;  . EUS N/A 10/28/2020   Procedure: UPPER ENDOSCOPIC ULTRASOUND (EUS) RADIAL;  Surgeon: Milus Banister, MD;  Location: [facility];  Service: Endoscopy;  Laterality: N/A;  . FINE NEEDLE ASPIRATION N/A 10/28/2020   Procedure: FINE NEEDLE ASPIRATION (FNA) LINEAR;  Surgeon: Milus Banister, MD;  Location: [facility];  Service: Endoscopy;  Laterality: N/A;  . MULTIPLE TOOTH EXTRACTIONS    .  POLYPECTOMY  01/27/2021   Procedure: POLYPECTOMY INTESTINAL;  Surgeon: Eloise Harman, DO;  [facility];  Service: Endoscopy;;  . VIDEO BRONCHOSCOPY N/A 10/12/2020   Procedure: VIDEO BRONCHOSCOPY WITH CRYOTHERAPY AND BALLOON DILATATION;  Surgeon: Garner Nash, DO;  Location: [facility];  Service: Pulmonary;  Laterality: N/A;  . VIDEO BRONCHOSCOPY WITH ENDOBRONCHIAL ULTRASOUND N/A  10/02/2018   Procedure: VIDEO BRONCHOSCOPY WITH ENDOBRONCHIAL ULTRASOUND;  Surgeon: Garner Nash, DO;  Location: [facility];  Service: Thoracic;  Laterality: N/A;  . VIDEO BRONCHOSCOPY WITH ENDOBRONCHIAL ULTRASOUND N/A 10/12/2020   Procedure: VIDEO BRONCHOSCOPY WITH ENDOBRONCHIAL ULTRASOUND;  Surgeon: Garner Nash, DO;  Location: [facility];  Service: Pulmonary;  Laterality: N/A;     reports that he quit smoking about 2 years ago. His smoking use included cigarettes. He smoked 2.00 packs per day. His smokeless tobacco use includes snuff. He reports current alcohol use of about 4.0 standard drinks of alcohol per week. He reports that he does not use drugs.  No Known Allergies  Family History  Problem Relation Age of Onset  . Heart disease Mother   . Heart disease Father   . Diabetes Other   . Hypertension Other   . Ovarian cancer Maternal Grandmother     Prior to Admission medications   Medication Sig Start Date End Date Taking? Authorizing Provider  albuterol (PROVENTIL) (2.5 MG/3ML) 0.083% nebulizer solution TAKE 3ML BY MOUTH VIA NEBULIZER EVERY 4 HOURS AS NEEDED FOR WHEEZING OR SHORTNESS OF BREATH Patient taking differently: Take 2.5 mg by nebulization every 4 (four) hours as needed (wheezing/shortness of breath.). 09/15/20   Icard, Octavio Graves, DO  albuterol (VENTOLIN HFA) 108 (90 Base) MCG/ACT inhaler INHALE 2 PUFFS BY MOUTH EVERY 6 HOURS AS NEEDED FOR SHORTNESS OF BREATH Patient taking differently: Inhale 2 puffs into the lungs every 6 (six) hours as needed for shortness of breath. 12/20/20   Icard, Leory Plowman L, DO  ascorbic acid (VITAMIN C) 500 MG tablet Take 500 mg by mouth daily.    [provider]  cholecalciferol (VITAMIN D3) 25 MCG (1000 UNIT) tablet Take 1,000 Units by mouth daily.    [provider]  diphenoxylate-atropine (LOMOTIL) 2.5-0.025 MG tablet Take 1-2 tablets by mouth 4 (four) times daily as needed for diarrhea or loose stools (Maximum of 8 tablets/day).  01/12/21   Ladell Pier, MD  feeding supplement, ENSURE ENLIVE, (ENSURE ENLIVE) LIQD Take 237 mLs by mouth 2 (two) times daily between meals. Patient taking differently: No sig reported 09/24/18   Florencia Reasons, MD  fexofenadine (ALLEGRA) 180 MG tablet Take 180 mg by mouth daily.    [provider]  Fluticasone-Umeclidin-Vilant (TRELEGY ELLIPTA) 100-62.5-25 MCG/INH AEPB Inhale 1 puff into the lungs daily. 12/20/20   Icard, Octavio Graves, DO  mirtazapine (REMERON) 7.5 MG tablet Take 1 tablet (7.5 mg total) by mouth at bedtime. Patient taking differently: Take 7.5 mg by mouth at bedtime as needed (sleep). 12/24/20   Ladell Pier, MD  polyethylene glycol-electrolytes (TRILYTE) 420 g solution Take 4,000 mLs by mouth as directed. 01/13/21   Eloise Harman, DO  potassium chloride SA (KLOR-CON) 20 MEQ tablet Take 1 tablet (20 mEq total) by mouth 2 (two) times daily. 01/07/21   Little Ishikawa, MD    Physical Exam: Vitals:   02/14/21 1400 02/14/21 1545 02/14/21 1630 02/14/21 1700  BP: 103/69 97/73 96/67  101/74  Pulse: (!) 107 (!) 101 (!) 104   Resp: 18 (!) 22 20 20   Temp:  98.3 F (36.8 C)     TempSrc: Oral     SpO2: 99% 96% 96%   Weight:      Height:        Constitutional: NAD, calm, comfortable Vitals:   02/14/21 1400 02/14/21 1545 02/14/21 1630 02/14/21 1700  BP: 103/69 97/73 96/67  101/74  Pulse: (!) 107 (!) 101 (!) 104   Resp: 18 (!) 22 20 20   Temp: 98.3 F (36.8 C)     TempSrc: Oral     SpO2: 99% 96% 96%   Weight:      Height:       Eyes: PERRL, lids and conjunctivae normal ENMT: Mucous membranes are moist.  Neck: normal, supple, no masses, no thyromegaly Respiratory: clear to auscultation bilaterally, no wheezing, no crackles. Normal respiratory effort. No accessory muscle use.  Cardiovascular: Regular rate and rhythm, no murmurs / rubs / gallops. No extremity edema. 2+ pedal pulses.   Abdomen: Moderately distended, no appreciable tenderness, abdomen soft to  slightly firm, no masses palpated. No hepatosplenomegaly. Bowel sounds positive.  Musculoskeletal: no clubbing / cyanosis. No joint deformity upper and lower extremities. Good ROM, no contractures. Normal muscle tone.  Skin: no rashes, lesions, ulcers. No induration Neurologic: No apparent cranial nerve abnormality Psychiatric: Normal judgment and insight. Alert and oriented x 3. Normal mood.   Labs on Admission: I have personally reviewed following labs and imaging studies  CBC: Recent Labs  Lab 02/14/21 1339  WBC 10.2  NEUTROABS 8.0*  HGB 11.0*  HCT 33.1*  MCV 100.3*  PLT 151   Basic Metabolic Panel: Recent Labs  Lab 02/14/21 1339  NA 132*  K 3.1*  CL 98  CO2 24  GLUCOSE 113*  BUN 12  CREATININE 0.89  CALCIUM 7.9*   Liver Function Tests: Recent Labs  Lab 02/14/21 1339  AST 23  ALT 12  ALKPHOS 118  BILITOT 0.6  PROT 5.9*  ALBUMIN 2.2*   Recent Labs  Lab 02/14/21 1339  LIPASE 217*   Recent Labs  Lab 02/14/21 1355  AMMONIA 17   Coagulation Profile: Recent Labs  Lab 02/14/21 1339  INR 2.1*    Radiological Exams on Admission: CT ABDOMEN PELVIS W CONTRAST  Result Date: 02/14/2021 CLINICAL DATA:  Abdominal distension, elevated lipase. History of metastatic lung cancer. EXAM: CT ABDOMEN AND PELVIS WITH CONTRAST TECHNIQUE: Multidetector CT imaging of the abdomen and pelvis was performed using the standard protocol following bolus administration of intravenous contrast. CONTRAST:  100 mL Omnipaque 300 intravenously. COMPARISON:  January 05, 2021. FINDINGS: Lower chest: Mild left basilar atelectasis or scarring is noted. Hepatobiliary: No gallstones are noted. Hepatic cirrhosis is noted. No biliary dilatation is noted. Pancreas: No ductal dilatation is noted. 3.8 x 3.3 cm complex but predominantly low density area is seen anterior and inferior to the pancreatic body which currently measures 3.8 x 3.3 cm which is decreased compared to prior exam. It is uncertain if  this represents mass or possibly pseudocyst. Mass involving the inferior portion of pancreatic head is stable compared to prior exam. Spleen: Normal in size without focal abnormality. Adrenals/Urinary Tract: Adrenal glands appear normal. Stable right renal cyst is noted. No hydronephrosis or renal obstruction is noted. Urinary bladder is unremarkable. Stomach/Bowel: Stomach is within normal limits. Appendix appears normal. No evidence of bowel wall thickening, distention, or inflammatory changes. Vascular/Lymphatic: Aortic atherosclerosis. No enlarged abdominal or pelvic lymph nodes. Reproductive: Prostate is unremarkable. Other: Interval development of mild to moderate ascites is noted in the pelvis and  around the liver and spleen. Musculoskeletal: No acute or significant osseous findings. IMPRESSION: Interval development of mild to moderate ascites is noted. Hepatic cirrhosis is noted. Stable appearance of pancreatic head mass. 3.8 x 3.3 cm complex but predominantly low density area is seen anterior and inferior to the pancreatic body which currently measures 3.8 x 3.3 cm which is decreased compared to prior exam. It is uncertain if this represents mass or possibly pseudocyst. Aortic Atherosclerosis (ICD10-I70.0). Electronically Signed   By: Marijo Conception M.D.   On: 02/14/2021 15:55    EKG: Independently reviewed.  Sinus tachycardia rate 103, QTc 430.  Assessment/Plan Principal Problem:   Ascites Active Problems:   Stage 3 severe COPD by GOLD classification (HCC)   Alcoholism (HCC)   Squamous cell carcinoma lung, left (HCC)   GI bleed   Hepatic cirrhosis (HCC)   Ascites- abdominal CT shows mild to moderate ascites. Likely secondary to hepatic cirrhosis, versus related to mets from lung cancer.  No peripheral signs of volume overload. Sats > 6% on room air. -Ultrasound-guided paracentesis in the morning, with fluid analysis to include cytology.  Hypokalemia-3.1.  Chronic issue.  Likely due to  alcoholism. - Replete Orally (patient prefers) -Check magnesium  History of alcohol abuse-drinks 6-7 beers daily, but has not drunk any alcoholic beverage in 2 weeks.  Blood alcohol level < 10. -CIWA as needed and scheduled -Multivitamins folic acid thiamine.  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 Benay Spice.  Currently undergoing chemo, last chemo February 2022, due to recent hospitalization he was unable to receive his third cycle of chemo. -Follow-up as outpatient  GI bleed- Hgb stable today.  Reports no melena since leaving the hospital.  EGD and subsequent outpatient colonoscopy did not reveal source of bleed.  COPD Gold stage III-not on home O2.  Stable without wheezing. -DuoNebs as needed  DVT prophylaxis: Lovenox  code Status: Full code.  Confirmed with patient and spouse at bedside. Family Communication: Spouse at bedside. Disposition Plan:  ~ 1 - 2 days Consults called: IR for paracentesis Admission status: Observation MedSurg.  [name] MD [facility]  02/14/2021, 6:27 PM

## 2021-02-14 NOTE — ED Triage Notes (Signed)
Pt reports abd bloating for about 2-3 weeks after he eats or drinks anything. Has not seen a doctor for this yet. States he stopped drinking alcohol l about 2 weeks ago. Before that he would drink several beers a day. Denies pmh of liver issues, or bloating before. Denies abd pain, n/v/d. No fevers. Abd is distended.

## 2021-02-15 ENCOUNTER — Encounter (HOSPITAL_COMMUNITY): Payer: Self-pay | Admitting: Internal Medicine

## 2021-02-15 ENCOUNTER — Observation Stay (HOSPITAL_COMMUNITY): Payer: Medicare Other

## 2021-02-15 DIAGNOSIS — I2510 Atherosclerotic heart disease of native coronary artery without angina pectoris: Secondary | ICD-10-CM | POA: Diagnosis present

## 2021-02-15 DIAGNOSIS — I10 Essential (primary) hypertension: Secondary | ICD-10-CM | POA: Diagnosis present

## 2021-02-15 DIAGNOSIS — Z8249 Family history of ischemic heart disease and other diseases of the circulatory system: Secondary | ICD-10-CM | POA: Diagnosis not present

## 2021-02-15 DIAGNOSIS — J449 Chronic obstructive pulmonary disease, unspecified: Secondary | ICD-10-CM | POA: Diagnosis present

## 2021-02-15 DIAGNOSIS — K7031 Alcoholic cirrhosis of liver with ascites: Principal | ICD-10-CM

## 2021-02-15 DIAGNOSIS — F1020 Alcohol dependence, uncomplicated: Secondary | ICD-10-CM | POA: Diagnosis present

## 2021-02-15 DIAGNOSIS — C3492 Malignant neoplasm of unspecified part of left bronchus or lung: Secondary | ICD-10-CM | POA: Diagnosis present

## 2021-02-15 DIAGNOSIS — C7889 Secondary malignant neoplasm of other digestive organs: Secondary | ICD-10-CM | POA: Diagnosis present

## 2021-02-15 DIAGNOSIS — D500 Iron deficiency anemia secondary to blood loss (chronic): Secondary | ICD-10-CM | POA: Diagnosis present

## 2021-02-15 DIAGNOSIS — Z6824 Body mass index (BMI) 24.0-24.9, adult: Secondary | ICD-10-CM | POA: Diagnosis not present

## 2021-02-15 DIAGNOSIS — E440 Moderate protein-calorie malnutrition: Secondary | ICD-10-CM | POA: Diagnosis present

## 2021-02-15 DIAGNOSIS — Z20822 Contact with and (suspected) exposure to covid-19: Secondary | ICD-10-CM | POA: Diagnosis present

## 2021-02-15 DIAGNOSIS — R188 Other ascites: Secondary | ICD-10-CM | POA: Diagnosis present

## 2021-02-15 DIAGNOSIS — Z8041 Family history of malignant neoplasm of ovary: Secondary | ICD-10-CM | POA: Diagnosis not present

## 2021-02-15 DIAGNOSIS — Z87891 Personal history of nicotine dependence: Secondary | ICD-10-CM | POA: Diagnosis not present

## 2021-02-15 DIAGNOSIS — E876 Hypokalemia: Secondary | ICD-10-CM | POA: Diagnosis present

## 2021-02-15 LAB — HEPATIC FUNCTION PANEL
ALT: 11 U/L (ref 0–44)
AST: 21 U/L (ref 15–41)
Albumin: 1.9 g/dL — ABNORMAL LOW (ref 3.5–5.0)
Alkaline Phosphatase: 103 U/L (ref 38–126)
Bilirubin, Direct: 0.1 mg/dL (ref 0.0–0.2)
Indirect Bilirubin: 0.4 mg/dL (ref 0.3–0.9)
Total Bilirubin: 0.5 mg/dL (ref 0.3–1.2)
Total Protein: 5.2 g/dL — ABNORMAL LOW (ref 6.5–8.1)

## 2021-02-15 LAB — BASIC METABOLIC PANEL
Anion gap: 9 (ref 5–15)
BUN: 13 mg/dL (ref 8–23)
CO2: 25 mmol/L (ref 22–32)
Calcium: 7.9 mg/dL — ABNORMAL LOW (ref 8.9–10.3)
Chloride: 100 mmol/L (ref 98–111)
Creatinine, Ser: 0.84 mg/dL (ref 0.61–1.24)
GFR, Estimated: >60 mL/min (ref >60)
Glucose, Bld: 99 mg/dL (ref 70–99)
Potassium: 3.7 mmol/L (ref 3.5–5.1)
Sodium: 134 mmol/L — ABNORMAL LOW (ref 135–145)

## 2021-02-15 LAB — BODY FLUID CELL COUNT WITH DIFFERENTIAL
Lymphs, Fluid: 13 %
Monocyte-Macrophage-Serous Fluid: 39 % — ABNORMAL LOW (ref 50–90)
Neutrophil Count, Fluid: 46 % — ABNORMAL HIGH (ref 0–25)
Other Cells, Fluid: 2 %
Total Nucleated Cell Count, Fluid: 430 cu mm (ref 0–1000)

## 2021-02-15 LAB — PROTIME-INR
INR: 2.3 — ABNORMAL HIGH (ref 0.8–1.2)
Prothrombin Time: 25.3 seconds — ABNORMAL HIGH (ref 11.4–15.2)

## 2021-02-15 LAB — ALBUMIN, PLEURAL OR PERITONEAL FLUID: Albumin, Fluid: 1 g/dL

## 2021-02-15 LAB — GLUCOSE, PLEURAL OR PERITONEAL FLUID: Glucose, Fluid: 116 mg/dL

## 2021-02-15 LAB — PROTEIN, PLEURAL OR PERITONEAL FLUID: Total protein, fluid: <3 g/dL

## 2021-02-15 LAB — GRAM STAIN: Gram Stain: NONE SEEN

## 2021-02-15 LAB — LACTATE DEHYDROGENASE, PLEURAL OR PERITONEAL FLUID: LD, Fluid: 56 U/L — ABNORMAL HIGH (ref 3–23)

## 2021-02-15 LAB — SARS CORONAVIRUS 2 (TAT 6-24 HRS): SARS Coronavirus 2: NEGATIVE

## 2021-02-15 MED ORDER — ENSURE ENLIVE PO LIQD
237.0000 mL | Freq: Two times a day (BID) | ORAL | Status: DC
  Administered 2021-02-15 – 2021-02-16 (×2): 237 mL via ORAL

## 2021-02-15 MED ORDER — SPIRONOLACTONE 25 MG PO TABS
50.0000 mg | ORAL_TABLET | Freq: Every day | ORAL | Status: DC
  Administered 2021-02-15 – 2021-02-16 (×2): 50 mg via ORAL
  Filled 2021-02-15 (×2): qty 2

## 2021-02-15 MED ORDER — FUROSEMIDE 20 MG PO TABS
20.0000 mg | ORAL_TABLET | Freq: Every day | ORAL | Status: DC
  Administered 2021-02-15 – 2021-02-16 (×2): 20 mg via ORAL
  Filled 2021-02-15 (×2): qty 1

## 2021-02-15 MED ORDER — PROSOURCE PLUS PO LIQD
30.0000 mL | Freq: Two times a day (BID) | ORAL | Status: DC
  Administered 2021-02-16: 30 mL via ORAL
  Filled 2021-02-15: qty 30

## 2021-02-15 NOTE — Discharge Instructions (Signed)
Paracentesis, Care After This sheet gives you information about how to care for yourself after your procedure. Your health care provider may also give you more specific instructions. If you have problems or questions, contact your health care provider. What can I expect after the procedure? After the procedure, it is common to have a small amount of clear fluid coming from the puncture site. Follow these instructions at home: Puncture site care  Follow instructions from your health care provider about how to take care of your puncture site. Make sure you: ? Wash your hands with soap and water before and after you change your bandage (dressing). If soap and water are not available, use hand sanitizer. ? Change your dressing as told by your health care provider.  Check your puncture area every day for signs of infection. Check for: ? Redness, swelling, or pain. ? More fluid or blood. ? Warmth. ? Pus or a bad smell.   General instructions  Return to your normal activities as told by your health care provider. Ask your health care provider what activities are safe for you.  Take over-the-counter and prescription medicines only as told by your health care provider.  Do not take baths, swim, or use a hot tub until your health care provider approves. Ask your health care provider if you may take showers. You may only be allowed to take sponge baths.  Keep all follow-up visits as told by your health care provider. This is important. Contact a health care provider if:  You have redness, swelling, or pain at your puncture site.  You have more fluid or blood coming from your puncture site.  Your puncture site feels warm to the touch.  You have pus or a bad smell coming from your puncture site.  You have a fever. Get help right away if:  You have chest pain or shortness of breath.  You develop increasing pain, discomfort, or swelling in your abdomen.  You feel dizzy or light-headed or  you faint. Summary  After the procedure, it is common to have a small amount of clear fluid coming from the puncture site.  Follow instructions from your health care provider about how to take care of your puncture site.  Check your puncture area every day signs of infection.  Keep all follow-up visits as told by your health care provider. This information is not intended to replace advice given to you by your health care provider. Make sure you discuss any questions you have with your health care provider. Document Revised: 04/29/2019 Document Reviewed: 08/06/2018 Elsevier Patient Education  2021 [employer]  

## 2021-02-15 NOTE — Progress Notes (Signed)
PROGRESS NOTE    Ronald [name]  BTD:[id number] DOB: 06-08-1956 DOA: 02/14/2021 PCP: [facility]   Brief Narrative:   Ronald [name] is a 65 y.o. 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 Patient presented to the ED with complaints of increasing abdominal distention ongoing for the past 3 weeks.  He was recently hospitalized in early March for symptomatic anemia secondary to GI bleed and EGD was unremarkable.  He has not been admitted for paracentesis and further evaluation of volume overload related to his cirrhosis.  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 Benay Spice  Prior GI bleed -EGD and subsequent outpatient colonoscopy with no source of bleeding -Hemoglobin remains stable  COPD -DuoNebs as needed with no acute bronchospasms noted   DVT prophylaxis: Lovenox Code Status: Full Family Communication: Spouse at bedside Disposition Plan:  Status is: Observation  The patient will require care spanning > 2 midnights and should be moved to inpatient because: Ongoing diagnostic testing needed not appropriate for outpatient work up, IV treatments appropriate due to intensity of illness or inability to take PO and Inpatient level of care appropriate due to severity of illness  Dispo: The  patient is from: Home              Anticipated d/c is to: Home              Patient currently is not medically stable to d/c.   Difficult to place patient No  Consultants:   GI  Procedures:   See below  Antimicrobials:   None   Subjective: Patient seen and evaluated today with no new acute complaints or concerns. No acute concerns or events noted overnight.  Objective: Vitals:   02/15/21 0242 02/15/21 0523 02/15/21 0721 02/15/21 1000  BP: 96/66 106/65  109/77  Pulse: 95 (!) 105  (!) 106  Resp: 20 18  18   Temp: 97.8 F (36.6 C) 97.7 F (36.5 C)  98.2 F (36.8 C)  TempSrc:  Oral  Oral  SpO2: 96% 100% 98% 100%  Weight:      Height:        Intake/Output Summary (Last 24 hours) at 02/15/2021 1009 Last data filed at 02/15/2021 0700 Gross per 24 hour  Intake --  Output 3 ml  Net -3 ml   Filed Weights   02/14/21 1313  Weight: 64 kg    Examination:  General exam: Appears calm and comfortable  Respiratory system: Clear to auscultation. Respiratory effort normal. Cardiovascular system: S1 & S2 heard, RRR.  Gastrointestinal system: Abdomen is moderately distended, nontender Central nervous system: Alert and awake Extremities: No edema Skin: No significant lesions noted Psychiatry: Flat affect.    Data Reviewed: I have personally reviewed following labs and imaging studies  CBC: Recent Labs  Lab 02/14/21 1339  WBC 10.2  NEUTROABS 8.0*  HGB 11.0*  HCT 33.1*  MCV 100.3*  PLT 115   Basic Metabolic Panel: Recent Labs  Lab 02/14/21 1339 02/15/21 0525  NA 132* 134*  K 3.1* 3.7  CL 98 100  CO2 24 25  GLUCOSE 113* 99  BUN 12 13  CREATININE 0.89 0.84  CALCIUM 7.9* 7.9*  MG 1.3*  --    GFR: Estimated Creatinine Clearance: 72.4 mL/min (by C-G formula based on SCr of 0.84 mg/dL). Liver Function Tests: Recent Labs  Lab 02/14/21 1339  AST 23  ALT 12  ALKPHOS 118  BILITOT 0.6  PROT 5.9*  ALBUMIN 2.2*   Recent Labs  Lab 02/14/21 1339  LIPASE  217*   Recent Labs  Lab 02/14/21 1355  AMMONIA 17   Coagulation Profile: Recent Labs  Lab 02/14/21 1339  INR 2.1*   Cardiac Enzymes: No results for input(s): CKTOTAL, CKMB, CKMBINDEX, TROPONINI in the last 168 hours. BNP (last 3 results) No results for input(s): PROBNP in the last 8760 hours. HbA1C: No results for input(s): HGBA1C in the last 72 hours. CBG: No results for input(s): GLUCAP in the last 168 hours. Lipid Profile: No results for input(s): CHOL, HDL, LDLCALC, TRIG, CHOLHDL, LDLDIRECT in the last 72 hours. Thyroid Function Tests: No results for input(s): TSH, T4TOTAL, FREET4, T3FREE, THYROIDAB in the last 72 hours. Anemia Panel: No results for input(s): VITAMINB12, FOLATE, FERRITIN, TIBC, IRON, RETICCTPCT in the last 72 hours. Sepsis Labs: No results for input(s): PROCALCITON, LATICACIDVEN in the last 168 hours.  Recent Results (from the past 240 hour(s))  SARS CORONAVIRUS 2 (TAT 6-24 HRS) Nasopharyngeal Nasopharyngeal Swab     Status: None   Collection Time: 02/14/21  5:37 PM   Specimen: Nasopharyngeal Swab  Result Value Ref Range Status   SARS Coronavirus 2 NEGATIVE NEGATIVE Final    Comment: (NOTE) SARS-CoV-2 target nucleic acids are NOT DETECTED.  The SARS-CoV-2 RNA is generally detectable in upper and lower respiratory specimens during the acute phase of infection. Negative results do not preclude SARS-CoV-2 infection, do not rule out co-infections with other pathogens, and should not be used as the sole basis for treatment or other patient management decisions. Negative results must be combined with clinical observations, patient history, and epidemiological information. The expected result is Negative.  Fact Sheet for Patients: SugarRoll.be  Fact Sheet for Healthcare Providers: https://www.woods-mathews.com/  This test is not yet approved or cleared by the [place] FDA and  has been authorized for  detection and/or diagnosis of SARS-CoV-2 by FDA under an Emergency Use Authorization (EUA). This EUA will remain  in effect (meaning this test can be used) for the duration of the COVID-19 declaration under Se ction 564(b)(1) of the Act, 21 U.S.C. section 360bbb-3(b)(1), unless the authorization is terminated or revoked sooner.  Performed at [facility], [place] [address] [place], [place] [address]          Radiology Studies: CT ABDOMEN PELVIS W CONTRAST  Result Date: 02/14/2021 CLINICAL DATA:  Abdominal distension, elevated lipase. History of metastatic lung cancer. EXAM: CT ABDOMEN AND PELVIS WITH CONTRAST TECHNIQUE: Multidetector CT imaging of the abdomen and pelvis was performed using the standard protocol following bolus administration of intravenous contrast. CONTRAST:  100 mL Omnipaque 300 intravenously. COMPARISON:  January 05, 2021. FINDINGS: Lower chest: Mild left basilar atelectasis or scarring is noted. Hepatobiliary: No gallstones are noted. Hepatic cirrhosis is noted. No biliary dilatation is noted. Pancreas: No ductal dilatation is noted. 3.8 x 3.3 cm complex but predominantly  low density area is seen anterior and inferior to the pancreatic body which currently measures 3.8 x 3.3 cm which is decreased compared to prior exam. It is uncertain if this represents mass or possibly pseudocyst. Mass involving the inferior portion of pancreatic head is stable compared to prior exam. Spleen: Normal in size without focal abnormality. Adrenals/Urinary Tract: Adrenal glands appear normal. Stable right renal cyst is noted. No hydronephrosis or renal obstruction is noted. Urinary bladder is unremarkable. Stomach/Bowel: Stomach is within normal limits. Appendix appears normal. No evidence of bowel wall thickening, distention, or inflammatory changes. Vascular/Lymphatic: Aortic atherosclerosis. No enlarged abdominal or pelvic lymph nodes. Reproductive: Prostate is unremarkable. Other: Interval  development of mild to moderate ascites is noted in the pelvis and around the liver and spleen. Musculoskeletal: No acute or significant osseous findings. IMPRESSION: Interval development of mild to moderate ascites is noted. Hepatic cirrhosis is noted. Stable appearance of pancreatic head mass. 3.8 x 3.3 cm complex but predominantly low density area is seen anterior and inferior to the pancreatic body which currently measures 3.8 x 3.3 cm which is decreased compared to prior exam. It is uncertain if this represents mass or possibly pseudocyst. Aortic Atherosclerosis (ICD10-I70.0). Electronically Signed   By: Marijo Conception M.D.   On: 02/14/2021 15:55        Scheduled Meds: . enoxaparin (LOVENOX) injection  40 mg Subcutaneous Q24H  . fluticasone furoate-vilanterol  1 puff Inhalation Daily  . folic acid  1 mg Oral Daily  . mirtazapine  7.5 mg Oral QHS  . multivitamin with minerals  1 tablet Oral Daily  . thiamine  100 mg Oral Daily   Or  . thiamine  100 mg Intravenous Daily  . umeclidinium bromide  1 puff Inhalation Daily    LOS: 0 days    Time spent: 35 minutes    Ronald Lauritsen Darleen Crocker, DO [facility]  If 7PM-7AM, please contact night-coverage [email or web address] 02/15/2021, 10:09 AM

## 2021-02-15 NOTE — Consult Note (Signed)
@LOGO @   Referring Provider: [facility]  Primary Care Physician:  [facility]:  Dr. Abbey Chatters  Date of Admission: 02/14/21 Date of Consultation: 02/15/21  Reason for Consultation:  New ascites  HPI:  Ronald [name] is a 65 y.o. 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  Patient presented to the emergency room with complaints of increasing abdominal distention x3 weeks.  ED Course: Heart rate 113, temperature 98.1.  Respiratory rate 18-22.  Blood pressure systolic 66-063.  O2 sats greater than 96% on room air.  Potassium 3.1.  INR 2.1.  Blood alcohol level less than 10.  Ammonia 17.  Abdominal CT with contrast showed mild to moderate ascites, stable pancreatic head mass, hepatic cirrhosis.  Paracentesis today yielding 3.8 L.  PMN 197.  Gram stain negative.  Culture pending.   Today:  Abdominal distension started 3 weeks ago. No trouble prior to this. No swelling in his legs. Has had some trouble with swelling in his legs in the past. No abdominal pain. Left side hurting a little since drawing the fluid off. Denies chagne in mental status or confusion. Denies yellowing of eyes and skin. Denies bruising, brbpr, or melena.   Denies nausea, vomiting, constipation, diarrhea, SOB or CP. Eats well at home. Drinks ensure. Eats canned foods and frozen dinners. Little fast food.   Last drank alcohol in about 3-4 weeks. Used to drink quite a bit daily.  Has 1 professional tattoo.  No history of IV or intranasal drug use.    Past Medical History:  Diagnosis Date  . Alcoholism ([place])   . Anemia    iron deficiency  . Cancer ([place])    lung  Stage I- 10/11/20  . Collapse of left lung    lower lobe  . COPD (chronic obstructive pulmonary disease) ([place])   . Depression   . Dyspnea    Exertion  . Family history of adverse reaction to anesthesia    sister had a bad headache  . Hypertension    10/11/20 - not in years  . Mediastinal adenopathy     and endobronchial lesion  . Pneumonia   . TB (pulmonary tuberculosis)   . Wears dentures     Past Surgical History:  Procedure Laterality Date  . BIOPSY  01/06/2021   Procedure: BIOPSY;  Surgeon: Milus Banister, MD;  Location: [facility];  Service: Endoscopy;;  . COLONOSCOPY WITH PROPOFOL N/A 01/27/2021   Procedure: COLONOSCOPY WITH PROPOFOL;  Surgeon: Eloise Harman, DO;  [facility];  Service: Endoscopy;  Laterality: N/A;  PM  . ESOPHAGOGASTRODUODENOSCOPY (EGD) WITH PROPOFOL N/A 10/28/2020   Procedure: ESOPHAGOGASTRODUODENOSCOPY (EGD) WITH PROPOFOL;  Surgeon: Milus Banister, MD;  Location: [facility];  Service: Endoscopy;  Laterality: N/A;  . ESOPHAGOGASTRODUODENOSCOPY (EGD) WITH PROPOFOL N/A 01/06/2021   Procedure: ESOPHAGOGASTRODUODENOSCOPY (EGD) WITH PROPOFOL;  Surgeon: Milus Banister, MD;  Location: [facility];  Service: Endoscopy;  Laterality: N/A;  . EUS N/A 10/28/2020   Procedure: UPPER ENDOSCOPIC ULTRASOUND (EUS) RADIAL;  Surgeon: Milus Banister, MD;  Location: [facility];  Service: Endoscopy;  Laterality: N/A;  . FINE NEEDLE ASPIRATION N/A 10/28/2020   Procedure: FINE NEEDLE ASPIRATION (FNA) LINEAR;  Surgeon: Milus Banister, MD;  Location: [facility];  Service: Endoscopy;  Laterality: N/A;  . MULTIPLE TOOTH EXTRACTIONS    . POLYPECTOMY  01/27/2021   Procedure: POLYPECTOMY INTESTINAL;  Surgeon: Eloise Harman, DO;  [facility];  Service: Endoscopy;;  . VIDEO BRONCHOSCOPY N/A 10/12/2020   Procedure: VIDEO BRONCHOSCOPY WITH CRYOTHERAPY AND BALLOON DILATATION;  Surgeon: Garner Nash, DO;  Location: [facility];  Service: Pulmonary;   Laterality: N/A;  . VIDEO BRONCHOSCOPY WITH ENDOBRONCHIAL ULTRASOUND N/A 10/02/2018   Procedure: VIDEO BRONCHOSCOPY WITH ENDOBRONCHIAL ULTRASOUND;  Surgeon: Garner Nash, DO;  Location: [facility];  Service: Thoracic;  Laterality: N/A;  . VIDEO BRONCHOSCOPY WITH ENDOBRONCHIAL ULTRASOUND N/A 10/12/2020   Procedure: VIDEO BRONCHOSCOPY WITH ENDOBRONCHIAL ULTRASOUND;  Surgeon: Garner Nash, DO;  Location: [facility];  Service: Pulmonary;  Laterality: N/A;    Prior to Admission medications   Medication Sig Start Date End Date Taking? Authorizing Provider  albuterol (PROVENTIL) (2.5 MG/3ML) 0.083% nebulizer solution TAKE 3ML BY MOUTH VIA NEBULIZER EVERY 4 HOURS AS NEEDED FOR WHEEZING OR SHORTNESS OF BREATH Patient taking differently: Take 2.5 mg by nebulization every 4 (four) hours as needed for shortness of breath. 09/15/20  Yes Icard, Bradley L, DO  albuterol (VENTOLIN HFA) 108 (90 Base) MCG/ACT inhaler INHALE 2 PUFFS BY MOUTH EVERY 6 HOURS AS NEEDED FOR SHORTNESS OF BREATH Patient taking differently: Inhale 2 puffs into the lungs every 6 (six) hours as needed for shortness of breath. 12/20/20  Yes Icard, Bradley L, DO  ascorbic acid (VITAMIN C) 500 MG tablet Take 500 mg by mouth daily.   Yes [provider]  cholecalciferol (VITAMIN D3) 25 MCG (1000 UNIT) tablet Take 1,000 Units by mouth daily.   Yes [provider]  diphenoxylate-atropine (LOMOTIL) 2.5-0.025 MG tablet Take 1-2 tablets by mouth 4 (four) times daily as needed for diarrhea or loose stools (Maximum of 8 tablets/day). 01/12/21  Yes Ladell Pier, MD  feeding supplement, ENSURE ENLIVE, (ENSURE ENLIVE) LIQD Take 237 mLs by mouth 2 (two) times daily between meals. Patient taking differently: Take 237 mLs by mouth daily. 09/24/18  Yes Florencia Reasons, MD  fexofenadine (ALLEGRA) 180 MG tablet Take 180 mg by mouth daily.   Yes [provider]  Fluticasone-Umeclidin-Vilant (TRELEGY ELLIPTA) 100-62.5-25 MCG/INH AEPB Inhale 1  puff into the lungs daily. 12/20/20  Yes Icard, Octavio Graves, DO  potassium chloride SA (KLOR-CON) 20 MEQ tablet Take 1 tablet (20 mEq total) by mouth 2 (two) times daily. 01/07/21  Yes Little Ishikawa, MD    Current Facility-Administered Medications  Medication Dose Route Frequency Provider Last Rate Last Admin  . enoxaparin (LOVENOX) injection 40 mg  40 mg Subcutaneous Q24H Emokpae, Ejiroghene E, MD   40 mg at 02/14/21 2141  . fluticasone furoate-vilanterol (BREO ELLIPTA) 100-25 MCG/INH 1 puff  1 puff Inhalation Daily Emokpae, Ejiroghene E, MD   1 puff at 02/15/21 0720  . folic acid (FOLVITE) tablet 1 mg  1 mg Oral Daily Emokpae, Ejiroghene E, MD   1 mg at 02/15/21 0843  . LORazepam (ATIVAN) tablet 1-4 mg  1-4 mg Oral Q1H PRN Emokpae, Ejiroghene E, MD       Or  . LORazepam (ATIVAN) injection 1-4 mg  1-4 mg Intravenous Q1H PRN Emokpae, Ejiroghene E, MD      . mirtazapine (REMERON) tablet 7.5 mg  7.5 mg Oral QHS Emokpae, Ejiroghene E, MD   7.5 mg at 02/14/21 2140  . multivitamin with minerals tablet  1 tablet  1 tablet Oral Daily Emokpae, Ejiroghene E, MD   1 tablet at 02/15/21 0843  . ondansetron (ZOFRAN) tablet 4 mg  4 mg Oral Q6H PRN Emokpae, Ejiroghene E, MD       Or  . ondansetron (ZOFRAN) injection 4 mg  4 mg Intravenous Q6H PRN Emokpae, Ejiroghene E, MD      . polyethylene glycol (MIRALAX / GLYCOLAX) packet 17 g  17 g Oral Daily PRN Emokpae, Ejiroghene E, MD      . thiamine tablet 100 mg  100 mg Oral Daily Emokpae, Ejiroghene E, MD   100 mg at 02/15/21 0843   Or  . thiamine (B-1) injection 100 mg  100 mg Intravenous Daily Emokpae, Ejiroghene E, MD      . traMADol (ULTRAM) tablet 50 mg  50 mg Oral Q8H PRN Emokpae, Ejiroghene E, MD      . umeclidinium bromide (INCRUSE ELLIPTA) 62.5 MCG/INH 1 puff  1 puff Inhalation Daily Emokpae, Ejiroghene E, MD   1 puff at 02/15/21 0720    Allergies as of 02/14/2021  . (No Known Allergies)    Family History  Problem Relation Age of Onset  . Heart  disease Mother   . Heart disease Father   . Diabetes Other   . Hypertension Other   . Ovarian cancer Maternal Grandmother     Social History   Socioeconomic History  . Marital status: Divorced    Spouse name: Not on file  . Number of children: Not on file  . Years of education: Not on file  . Highest education level: Not on file  Occupational History  . Not on file  Tobacco Use  . Smoking status: Former Smoker    Packs/day: 2.00    Types: Cigarettes    Quit date: 09/19/2018    Years since quitting: 2.4  . Smokeless tobacco: Current User    Types: Snuff  Vaping Use  . Vaping Use: Never used  Substance and Sexual Activity  . Alcohol use: Yes    Alcohol/week: 4.0 standard drinks    Types: 4 Cans of beer per week    Comment: occas  . Drug use: No  . Sexual activity: Never  Other Topics Concern  . Not on file  Social History Narrative  . Not on file   Social Determinants of Health   Financial Resource Strain: Not on file  Food Insecurity: Not on file  Transportation Needs: Not on file  Physical Activity: Not on file  Stress: Not on file  Social Connections: Not on file  Intimate Partner Violence: Not on file    Review of Systems: Gen: Denies fever, chills, cold or flulike symptoms, presyncope, syncope. CV: Denies chest pain or palpitations. Resp: Denies shortness of breath or cough. GI: See HPI Psych: Admits to depression and some trouble sleeping. Heme: See HPI  Physical Exam: Vital signs in last 24 hours: Temp:  [97.7 F (36.5 C)-98.4 F (36.9 C)] 98.2 F (36.8 C) (04/19 1000) Pulse Rate:  [95-113] 100 (04/19 1058) Resp:  [18-22] 18 (04/19 1058) BP: (96-115)/(62-81) 96/65 (04/19 1058) SpO2:  [96 %-100 %] 100 % (04/19 1058) Weight:  [64 kg] 64 kg (04/18 1313) Last BM Date: 02/14/21 General:   Alert,  pleasant and cooperative in NAD Head:  Normocephalic and atraumatic. Eyes:  Sclera clear.    Ears:  Normal auditory acuity. Lungs:  Clear throughout  to auscultation.   No wheezes, crackles, or rhonchi. No acute distress. Heart:  Regular rate and rhythm; no murmurs, clicks, rubs,  or gallops. Abdomen:  Soft without distension. Mild TTP across the upper abdomen with deep palpation. No TTP across lower abdomen with slight voluntary guarding , no rebound. No masses, hepatosplenomegaly. Small umbilical hernia. Normal bowel sounds.   Rectal:  Deferred  Msk:  Symmetrical without gross deformities. Normal posture. Extremities:  With trace bilateral LE edema.  Neurologic:  Alert and  oriented x4;  grossly normal neurologically. Skin:  Intact without significant lesions or rashes. Psych: Normal mood and affect.  Intake/Output from previous day: 04/18 0701 - 04/19 0700 In: 480 [P.O.:480] Out: 3 [Urine:3] Intake/Output this shift: No intake/output data recorded.  Lab Results: Recent Labs    02/14/21 1339  WBC 10.2  HGB 11.0*  HCT 33.1*  PLT 280   BMET Recent Labs    02/14/21 1339 02/15/21 0525  NA 132* 134*  K 3.1* 3.7  CL 98 100  CO2 24 25  GLUCOSE 113* 99  BUN 12 13  CREATININE 0.89 0.84  CALCIUM 7.9* 7.9*   LFT Recent Labs    02/14/21 1339  PROT 5.9*  ALBUMIN 2.2*  AST 23  ALT 12  ALKPHOS 118  BILITOT 0.6   PT/INR Recent Labs    02/14/21 1339  LABPROT 23.6*  INR 2.1*   Studies/Results: CT ABDOMEN PELVIS W CONTRAST  Result Date: 02/14/2021 CLINICAL DATA:  Abdominal distension, elevated lipase. History of metastatic lung cancer. EXAM: CT ABDOMEN AND PELVIS WITH CONTRAST TECHNIQUE: Multidetector CT imaging of the abdomen and pelvis was performed using the standard protocol following bolus administration of intravenous contrast. CONTRAST:  100 mL Omnipaque 300 intravenously. COMPARISON:  January 05, 2021. FINDINGS: Lower chest: Mild left basilar atelectasis or scarring is noted. Hepatobiliary: No gallstones are noted. Hepatic cirrhosis is noted. No biliary dilatation is noted. Pancreas: No ductal dilatation is noted.  3.8 x 3.3 cm complex but predominantly low density area is seen anterior and inferior to the pancreatic body which currently measures 3.8 x 3.3 cm which is decreased compared to prior exam. It is uncertain if this represents mass or possibly pseudocyst. Mass involving the inferior portion of pancreatic head is stable compared to prior exam. Spleen: Normal in size without focal abnormality. Adrenals/Urinary Tract: Adrenal glands appear normal. Stable right renal cyst is noted. No hydronephrosis or renal obstruction is noted. Urinary bladder is unremarkable. Stomach/Bowel: Stomach is within normal limits. Appendix appears normal. No evidence of bowel wall thickening, distention, or inflammatory changes. Vascular/Lymphatic: Aortic atherosclerosis. No enlarged abdominal or pelvic lymph nodes. Reproductive: Prostate is unremarkable. Other: Interval development of mild to moderate ascites is noted in the pelvis and around the liver and spleen. Musculoskeletal: No acute or significant osseous findings. IMPRESSION: Interval development of mild to moderate ascites is noted. Hepatic cirrhosis is noted. Stable appearance of pancreatic head mass. 3.8 x 3.3 cm complex but predominantly low density area is seen anterior and inferior to the pancreatic body which currently measures 3.8 x 3.3 cm which is decreased compared to prior exam. It is uncertain if this represents mass or possibly pseudocyst. Aortic Atherosclerosis (ICD10-I70.0). Electronically Signed   By: Marijo Conception M.D.   On: 02/14/2021 15:55   US Paracentesis  Result Date: 02/15/2021 INDICATION: Lung cancer on chemotherapy, ascites EXAM: ULTRASOUND GUIDED DIAGNOSTIC AND THERAPEUTIC PARACENTESIS MEDICATIONS: None COMPLICATIONS: None immediate at PROCEDURE: Informed written consent was obtained from the patient after a discussion of the risks, benefits and alternatives to treatment. A timeout was performed prior  to the initiation of the procedure. Initial  ultrasound scanning demonstrates a large amount of ascites within the right lower abdominal quadrant. The right lower abdomen was prepped and draped in the usual sterile fashion. 1% lidocaine was used for local anesthesia. Following this, a 8 [place] Yueh catheter was introduced. An ultrasound image was saved for documentation purposes. The paracentesis was performed. The catheter was removed and a dressing was applied. The patient tolerated the procedure well without immediate post procedural complication. FINDINGS: A total of approximately 3.8 L of yellow ascitic fluid was removed. Samples were sent to the laboratory as requested by the clinical team. IMPRESSION: Successful ultrasound-guided paracentesis yielding 3.8 liters of peritoneal fluid. Electronically Signed   By: Lavonia Dana M.D.   On: 02/15/2021 11:26    Impression: 65 y.o. 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    Ascites: Per patient, he has never had any trouble with abdominal distention in the past and no significant peripheral edema.  Admits to eating canned foods and frozen dinners frequently.   Denies any associated abdominal pain but on exam, he does have mild tenderness across the upper abdomen to deep palpation with mild guarding but no rebound.  Per patient, this is more of a chronic issue.  Suspect this could be related to metastatic cancer to his pancreas as this is most prominent in the epigastric area.  He underwent paracentesis today yielding 3.8 L. PMN 197.  Gram stain negative.  Culture pending. Cytology was also ordered. Per lab, cytology is sent out to [facility].   Suspect ascites is likely secondary to cirrhosis in the setting of dietary indiscretion.  Less likely malignant ascites.   PMNs are not consistent with SBP.  Additionally, he is afebrile and WBCs within normal limits.  We will plan to start him on low-dose diuretics with spironolactone 50 mg daily and Lasix 20 mg daily.  We will need to monitor kidney function while inpatient and repeat BMP in 1 week.  Counseled extensively on 2 g sodium diet.  Cirrhosis: Suspected to be secondary to alcohol.  He has never been screened for hepatitis.  Risk factor for hep C includes 1 tattoo.  Denies history of IV or intranasal drug use.  MELD 19 on admission. Aside from ascites discussed above, he has no other signs or symptoms of decompensated liver disease. Platelets remain within normal limits.  Updating meld labs today.  He should be screened for hepatitis B and C and evaluated for immunity to hepatitis A and B.  This can be completed as outpatient.  He has follow-up with Dr. Abbey Chatters on 5/4.  Plan: 1.  INR, HFP to recalculate MELD.  2.  Start Lasix 20 mg daily and spironolactone 50 mg daily. 3.  Strict 2 g sodium diet. 4.  Advised to monitor for worsening swelling in his abdomen or lower extremities, changes in mental status/confusion, yellowing of the eyes or skin, bruising or bleeding and let us know if this occurs. 5.  Monitor BMP while inpatient.  He will also need repeat BMP 1 week. 6.  Follow-up on pending paracentesis fluid analysis. 7.  Strict avoidance of alcohol.  8.  Keep follow-up appointment with Dr. Abbey Chatters on 5/4.     LOS: 0 days    02/15/2021,  12:42 PM   Aliene Altes, PA-C [facility]

## 2021-02-15 NOTE — Sedation Documentation (Signed)
Pt tolerated right sided paracentesis procedure well today and 3.8 Liters of clear dark yellow fluid removed with labs collected and sent for processing. PT verbalized understanding of post procedure instructions and vital signs remained stable. Dermabond and bandaid applied to right abdomen. PT returned to inpatient bed assignment on stretcher by transporter.

## 2021-02-15 NOTE — Procedures (Signed)
PreOperative Dx: Lung cancer on chemotherapy, ascites Postoperative Dx: Lung cancer on chemotherapy, ascites Procedure:   US guided paracentesis Radiologist:  Thornton Papas Anesthesia:  10 ml of1% lidocaine Specimen:  3.8 L of yellow ascitic fluid EBL:   < 1 ml Complications: None

## 2021-02-15 NOTE — Progress Notes (Addendum)
Initial Nutrition Assessment  DOCUMENTATION CODES:   Non-severe (moderate) malnutrition in context of chronic illness  INTERVENTION:  Ensure Daily BID  Protein modular:ProSource Plus 30 ml BID  Low sodium diet education  NUTRITION DIAGNOSIS:   Moderate Malnutrition related to catabolic illness (cirrhosis) as evidenced by estimated needs,mild fat depletion,moderate fat depletion,moderate muscle depletion.   GOAL:  Patient will meet greater than or equal to 90% of their needs  MONITOR:  Supplement acceptance,PO intake,Labs,I & O's,Weight trends  REASON FOR ASSESSMENT:   Malnutrition Screening Tool    ASSESSMENT: Patient is a 65 yo male with history of stage 4 lung cancer (completing chemotherapy), Cirrhosis of the liver and alcoholism. Presents with abdominal pain/distention. New onset ascites. Status post paracentesis removal of 3.8 liters clear dark yellow fluid.   Meal intake 100% at breakfast and 75-100% of lunch. Independent with feeding. Home diet is regular.  Good appetite up to the past 2-3 weeks when he began to experience increased abdominal distention.   Patient daughter lives with him and they both help with meals. He usually has cherrios for breakfast, sandwich for lunch (bologna and cheese), and dinner is meat and veggies. Reviewed low sodium diet and emphasized avoiding processed, packaged food products. Encouraged limiting or avoiding bologna and cheese and offered ideas for alternate lower sodium sandwich choices. Recommended 5-6 small meals daily which could include a high protein oral nutrition shake. Discussed the importance of adequate energy and protein daily.  Body weight ranging 69-71 kg. Currently (141 lb) 64 kg- loss of 7% in one month. Patient with significant fluid accumulation likely impacting weight change. Patient reports usual wt as 152 lb.   Medications: folic acid, lasix, remeron, MVI, Aldactone  Labs: BMP Latest Ref Rng & Units 02/15/2021  02/14/2021 01/14/2021  Glucose 70 - 99 mg/dL 99 113(H) 122(H)  BUN 8 - 23 mg/dL 13 12 9   Creatinine 0.61 - 1.24 mg/dL 0.84 0.89 0.57(L)  BUN/Creat Ratio 6 - 22 (calc) - - 16  Sodium 135 - 145 mmol/L 134(L) 132(L) 134(L)  Potassium 3.5 - 5.1 mmol/L 3.7 3.1(L) 3.9  Chloride 98 - 111 mmol/L 100 98 99  CO2 22 - 32 mmol/L 25 24 23   Calcium 8.9 - 10.3 mg/dL 7.9(L) 7.9(L) 8.7     NUTRITION - FOCUSED PHYSICAL EXAM: Nutrition-Focused physical exam completed. Findings are mild buccal and moderate orbital subcutaneous fat depletion, moderate acromion bone region muscle depletion, and no lower extremity edema.     Diet Order:   Diet Order            Diet regular Room service appropriate? Yes; Fluid consistency: Thin  Diet effective now                 EDUCATION NEEDS:  Education needs have been addressed  Skin:  Skin Assessment: Reviewed RN Assessment  Last BM:  4/18  Height:   Ht Readings from Last 1 Encounters:  02/14/21 5\' 4"  (1.626 m)    Weight:   Wt Readings from Last 1 Encounters:  02/14/21 64 kg    Ideal Body Weight:   59 kg  BMI:  Body mass index is 24.2 kg/m.  Estimated Nutritional Needs:   Kcal:  9476-5465  Protein:  96-109 gr  Fluid:  per MD goal   Colman Cater MS,RD,CSG,LDN Contact - [email or web address]

## 2021-02-16 DIAGNOSIS — K7031 Alcoholic cirrhosis of liver with ascites: Secondary | ICD-10-CM

## 2021-02-16 LAB — COMPREHENSIVE METABOLIC PANEL
ALT: 11 U/L (ref 0–44)
AST: 19 U/L (ref 15–41)
Albumin: 1.8 g/dL — ABNORMAL LOW (ref 3.5–5.0)
Alkaline Phosphatase: 100 U/L (ref 38–126)
Anion gap: 7 (ref 5–15)
BUN: 15 mg/dL (ref 8–23)
CO2: 26 mmol/L (ref 22–32)
Calcium: 7.8 mg/dL — ABNORMAL LOW (ref 8.9–10.3)
Chloride: 102 mmol/L (ref 98–111)
Creatinine, Ser: 0.85 mg/dL (ref 0.61–1.24)
GFR, Estimated: >60 mL/min (ref >60)
Glucose, Bld: 124 mg/dL — ABNORMAL HIGH (ref 70–99)
Potassium: 4 mmol/L (ref 3.5–5.1)
Sodium: 135 mmol/L (ref 135–145)
Total Bilirubin: 0.7 mg/dL (ref 0.3–1.2)
Total Protein: 5 g/dL — ABNORMAL LOW (ref 6.5–8.1)

## 2021-02-16 LAB — CBC
HCT: 32.8 % — ABNORMAL LOW (ref 39.0–52.0)
Hemoglobin: 10.7 g/dL — ABNORMAL LOW (ref 13.0–17.0)
MCH: 33.1 pg (ref 26.0–34.0)
MCHC: 32.6 g/dL (ref 30.0–36.0)
MCV: 101.5 fL — ABNORMAL HIGH (ref 80.0–100.0)
Platelets: 235 10*3/uL (ref 150–400)
RBC: 3.23 MIL/uL — ABNORMAL LOW (ref 4.22–5.81)
RDW: 14.8 % (ref 11.5–15.5)
WBC: 7.2 10*3/uL (ref 4.0–10.5)
nRBC: 0 % (ref 0.0–0.2)

## 2021-02-16 LAB — MAGNESIUM: Magnesium: 1.5 mg/dL — ABNORMAL LOW (ref 1.7–2.4)

## 2021-02-16 LAB — HEPATITIS PANEL, ACUTE
HCV Ab: NONREACTIVE
Hep A IgM: NONREACTIVE
Hep B C IgM: NONREACTIVE
Hepatitis B Surface Ag: NONREACTIVE

## 2021-02-16 LAB — PROTIME-INR
INR: 2.2 — ABNORMAL HIGH (ref 0.8–1.2)
Prothrombin Time: 24 seconds — ABNORMAL HIGH (ref 11.4–15.2)

## 2021-02-16 LAB — HEPATITIS A ANTIBODY, TOTAL: hep A Total Ab: NONREACTIVE

## 2021-02-16 LAB — HEPATITIS B SURFACE ANTIGEN: Hepatitis B Surface Ag: NONREACTIVE

## 2021-02-16 LAB — CYTOLOGY - NON PAP

## 2021-02-16 MED ORDER — SPIRONOLACTONE 25 MG PO TABS: 50.0000 mg | ORAL_TABLET | Freq: Every day | ORAL | 2 refills | Status: DC

## 2021-02-16 MED ORDER — MAGNESIUM SULFATE 2 GM/50ML IV SOLN
2.0000 g | Freq: Once | INTRAVENOUS | Status: AC
  Administered 2021-02-16: 2 g via INTRAVENOUS
  Filled 2021-02-16: qty 50

## 2021-02-16 MED ORDER — THIAMINE HCL 100 MG PO TABS: 100.0000 mg | ORAL_TABLET | Freq: Every day | ORAL | 0 refills | Status: AC

## 2021-02-16 MED ORDER — MIRTAZAPINE 7.5 MG PO TABS: 7.5000 mg | ORAL_TABLET | Freq: Every day | ORAL | 2 refills | Status: DC

## 2021-02-16 MED ORDER — FUROSEMIDE 20 MG PO TABS: 20.0000 mg | ORAL_TABLET | Freq: Every day | ORAL | 2 refills | Status: DC

## 2021-02-16 NOTE — Discharge Summary (Signed)
Physician Discharge Summary  Ronald [name] UXN:[id number] DOB: [birth year] DOA: 02/14/2021  PCP: [facility] date: 02/14/2021  Discharge date: 02/16/2021  Admitted From:Home  Disposition:  Home  Recommendations for Outpatient Follow-up:  1. Follow up with PCP in 1-2 weeks 2. Follow-up with gastroenterology Dr. Abbey Chatters in 2-3 weeks with appointment will be scheduled 3. Remain on low-sodium diet 4. Remain on Lasix 20 mg daily and spironolactone 50 mg daily as prescribed 5. Counseled on alcohol cessation 6. Please follow-up with oncology Dr. 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 Patient presented to the ED with complaints of increasing abdominal distention ongoing for the past 3 weeks.  He was recently hospitalized in early March for symptomatic anemia secondary to GI bleed and EGD was unremarkable.  He has not been admitted for paracentesis and had 3.8 L of fluid removed on 4/19 with improvement in his symptoms.  He is tolerating diet with no further complaints or concerns noted and has been started on Lasix and spironolactone per GI.  Further tests such as viral markers for hepatitis are currently pending, but he will have follow-up arranged in 2-3 weeks as noted above and is to remain on diuretics as ordered.  No other acute events noted this admission and he is stable for discharge.  Discharge Diagnoses:  Principal Problem:   Ascites Active Problems:   Stage 3 severe COPD by GOLD classification ([place])   Alcoholism ([place])   Squamous cell carcinoma lung, left (HCC)   GI bleed   Hepatic cirrhosis ([place])  Principal discharge  diagnosis: New onset ascites in the setting of alcoholic liver cirrhosis.  Discharge Instructions  Discharge Instructions    Diet - low sodium heart healthy   Complete by: As directed    Increase activity slowly   Complete by: As directed      Allergies as of 02/16/2021   No Known Allergies     Medication List    TAKE these medications   albuterol (2.5 MG/3ML) 0.083% nebulizer solution Commonly known as: PROVENTIL TAKE 3ML BY MOUTH VIA NEBULIZER EVERY 4 HOURS AS NEEDED FOR WHEEZING OR SHORTNESS OF BREATH What changed:   how much to take  how to take this  when to take this  reasons to take this  additional instructions   albuterol 108 (90 Base) MCG/ACT inhaler Commonly known as: Ventolin HFA INHALE 2 PUFFS BY MOUTH EVERY 6 HOURS AS NEEDED FOR SHORTNESS OF BREATH What changed:   how much to take  how to take this  when to take this  reasons to take this  additional instructions   ascorbic acid 500 MG tablet Commonly known as: VITAMIN C Take 500 mg by mouth daily.   cholecalciferol 25 MCG (1000 UNIT) tablet Commonly known as: VITAMIN D3 Take 1,000 Units by mouth daily.   diphenoxylate-atropine 2.5-0.025 MG tablet Commonly known as: LOMOTIL Take 1-2 tablets by mouth 4 (four) times daily as needed for diarrhea or loose stools (Maximum of 8 tablets/day).   feeding supplement Liqd Take 237 mLs by mouth 2 (two) times daily between meals. What changed: when to take this   fexofenadine 180 MG tablet Commonly known as: ALLEGRA Take 180 mg by mouth  daily.   furosemide 20 MG tablet Commonly known as: LASIX Take 1 tablet (20 mg total) by mouth daily. Start taking on: February 17, 2021   mirtazapine 7.5 MG tablet Commonly known as: REMERON Take 1 tablet (7.5 mg total) by mouth at bedtime.   potassium chloride SA 20 MEQ tablet Commonly known as: KLOR-CON Take 1 tablet (20 mEq total) by mouth 2 (two) times daily.   spironolactone 25 MG tablet Commonly  known as: ALDACTONE Take 2 tablets (50 mg total) by mouth daily. Start taking on: February 17, 2021   thiamine 100 MG tablet Take 1 tablet (100 mg total) by mouth daily. Start taking on: February 17, 2021   Trelegy Ellipta 100-62.5-25 MCG/INH Aepb Generic drug: Fluticasone-Umeclidin-Vilant Inhale 1 puff into the lungs daily.       Follow-up Information    [facility] Schedule an appointment as soon as possible for a visit in 1 week(s).   Contact information: [address] [place] [address] [phone]        Eloise Harman, DO Follow up in 2 week(s).   Specialty: Gastroenterology Contact information: [address] [place] [address] [phone]              No Known Allergies  Consultations:  GI   Procedures/Studies: CT ABDOMEN PELVIS W CONTRAST  Result Date: 02/14/2021 CLINICAL DATA:  Abdominal distension, elevated lipase. History of metastatic lung cancer. EXAM: CT ABDOMEN AND PELVIS WITH CONTRAST TECHNIQUE: Multidetector CT imaging of the abdomen and pelvis was performed using the standard protocol following bolus administration of intravenous contrast. CONTRAST:  100 mL Omnipaque 300 intravenously. COMPARISON:  January 05, 2021. FINDINGS: Lower chest: Mild left basilar atelectasis or scarring is noted. Hepatobiliary: No gallstones are noted. Hepatic cirrhosis is noted. No biliary dilatation is noted. Pancreas: No ductal dilatation is noted. 3.8 x 3.3 cm complex but predominantly low density area is seen anterior and inferior to the pancreatic body which currently measures 3.8 x 3.3 cm which is decreased compared to prior exam. It is uncertain if this represents mass or possibly pseudocyst. Mass involving the inferior portion of pancreatic head is stable compared to prior exam. Spleen: Normal in size without focal abnormality. Adrenals/Urinary Tract: Adrenal glands appear normal. Stable right renal cyst is noted. No hydronephrosis or renal  obstruction is noted. Urinary bladder is unremarkable. Stomach/Bowel: Stomach is within normal limits. Appendix appears normal. No evidence of bowel wall thickening, distention, or inflammatory changes. Vascular/Lymphatic: Aortic atherosclerosis. No enlarged abdominal or pelvic lymph nodes. Reproductive: Prostate is unremarkable. Other: Interval development of mild to moderate ascites is noted in the pelvis and around the liver and spleen. Musculoskeletal: No acute or significant osseous findings. IMPRESSION: Interval development of mild to moderate ascites is noted. Hepatic cirrhosis is noted. Stable appearance of pancreatic head mass. 3.8 x 3.3 cm complex but predominantly low density area is seen anterior and inferior to the pancreatic body which currently measures 3.8 x 3.3 cm which is decreased compared to prior exam. It is uncertain if this represents mass or possibly pseudocyst. Aortic Atherosclerosis (ICD10-I70.0). Electronically Signed   By: Marijo Conception M.D.   On: 02/14/2021 15:55   US Paracentesis  Result Date: 02/15/2021 INDICATION: Lung cancer on chemotherapy, ascites EXAM: ULTRASOUND GUIDED DIAGNOSTIC AND THERAPEUTIC PARACENTESIS MEDICATIONS: None COMPLICATIONS: None immediate at PROCEDURE: Informed written consent was obtained from the patient after a discussion of the risks, benefits and alternatives to treatment. A timeout was performed prior to  the initiation of the procedure. Initial ultrasound scanning demonstrates a large amount of ascites within the right lower abdominal quadrant. The right lower abdomen was prepped and draped in the usual sterile fashion. 1% lidocaine was used for local anesthesia. Following this, a 8 [place] Yueh catheter was introduced. An ultrasound image was saved for documentation purposes. The paracentesis was performed. The catheter was removed and a dressing was applied. The patient tolerated the procedure well without immediate post procedural complication.  FINDINGS: A total of approximately 3.8 L of yellow ascitic fluid was removed. Samples were sent to the laboratory as requested by the clinical team. IMPRESSION: Successful ultrasound-guided paracentesis yielding 3.8 liters of peritoneal fluid. Electronically Signed   By: Lavonia Dana M.D.   On: 02/15/2021 11:26      Discharge Exam: Vitals:   02/16/21 0543 02/16/21 0716  BP: 92/65   Pulse: 95   Resp: 18   Temp: 98 F (36.7 C)   SpO2: 99% 99%   Vitals:   02/15/21 1600 02/15/21 2109 02/16/21 0543 02/16/21 0716  BP:  (!) 82/50 92/65   Pulse: 91 98 95   Resp:  18 18   Temp:  98.5 F (36.9 C) 98 F (36.7 C)   TempSrc:  Oral Oral   SpO2:  100% 99% 99%  Weight:      Height:        General: Pt is alert, awake, not in acute distress Cardiovascular: RRR, S1/S2 +, no rubs, no gallops Respiratory: CTA bilaterally, no wheezing, no rhonchi Abdominal: Soft, NT, very minimally distended, bowel sounds + Extremities: no edema, no cyanosis    The results of significant diagnostics from this hospitalization (including imaging, microbiology, ancillary and laboratory) are listed below for reference.     Microbiology: Recent Results (from the past 240 hour(s))  SARS CORONAVIRUS 2 (TAT 6-24 HRS) Nasopharyngeal Nasopharyngeal Swab     Status: None   Collection Time: 02/14/21  5:37 PM   Specimen: Nasopharyngeal Swab  Result Value Ref Range Status   SARS Coronavirus 2 NEGATIVE NEGATIVE Final    Comment: (NOTE) SARS-CoV-2 target nucleic acids are NOT DETECTED.  The SARS-CoV-2 RNA is generally detectable in upper and lower respiratory specimens during the acute phase of infection. Negative results do not preclude SARS-CoV-2 infection, do not rule out co-infections with other pathogens, and should not be used as the sole basis for treatment or other patient management decisions. Negative results must be combined with clinical observations, patient history, and epidemiological information. The  expected result is Negative.  Fact Sheet for Patients: SugarRoll.be  Fact Sheet for Healthcare Providers: https://www.woods-mathews.com/  This test is not yet approved or cleared by the [place] FDA and  has been authorized for detection and/or diagnosis of SARS-CoV-2 by FDA under an Emergency Use Authorization (EUA). This EUA will remain  in effect (meaning this test can be used) for the duration of the COVID-19 declaration under Se ction 564(b)(1) of the Act, 21 U.S.C. section 360bbb-3(b)(1), unless the authorization is terminated or revoked sooner.  Performed at [facility], [place] [address], [place], [place] [address]   Culture, body fluid w Gram Stain-bottle     Status: None (Preliminary result)   Collection Time: 02/15/21 10:30 AM   Specimen: Peritoneal Washings  Result Value Ref Range Status   Specimen Description PERITONEAL  Final   Special Requests BOTTLES DRAWN AEROBIC AND ANAEROBIC 10CC  Final   Culture   Final    NO GROWTH < 24  HOURS Performed at [facility], [address], [place], [place] [address]    Report Status PENDING  Incomplete  Gram stain     Status: None   Collection Time: 02/15/21 10:30 AM   Specimen: Peritoneal Washings  Result Value Ref Range Status   Specimen Description PERITONEAL  Final   Special Requests NONE  Final   Gram Stain   Final    NO ORGANISMS SEEN CYTOSPIN SMEAR WBC PRESENT, PREDOMINANTLY PMN Performed at [facility], [address] [place], [place] [address]    Report Status 02/15/2021 FINAL  Final     Labs: BNP (last 3 results) No results for input(s): BNP in the last 8760 hours. Basic Metabolic Panel: Recent Labs  Lab 02/14/21 1339 02/15/21 0525 02/16/21 0529  NA 132* 134* 135  K 3.1* 3.7 4.0  CL 98 100 102  CO2 24 25 26   GLUCOSE 113* 99 124*  BUN 12 13 15   CREATININE 0.89 0.84 0.85  CALCIUM 7.9* 7.9* 7.8*  MG 1.3*  --  1.5*   Liver Function Tests: Recent  Labs  Lab 02/14/21 1339 02/15/21 1416 02/16/21 0529  AST 23 21 19   ALT 12 11 11   ALKPHOS 118 103 100  BILITOT 0.6 0.5 0.7  PROT 5.9* 5.2* 5.0*  ALBUMIN 2.2* 1.9* 1.8*   Recent Labs  Lab 02/14/21 1339  LIPASE 217*   Recent Labs  Lab 02/14/21 1355  AMMONIA 17   CBC: Recent Labs  Lab 02/14/21 1339 02/16/21 0529  WBC 10.2 7.2  NEUTROABS 8.0*  --   HGB 11.0* 10.7*  HCT 33.1* 32.8*  MCV 100.3* 101.5*  PLT 280 235   Cardiac Enzymes: No results for input(s): CKTOTAL, CKMB, CKMBINDEX, TROPONINI in the last 168 hours. BNP: Invalid input(s): POCBNP CBG: No results for input(s): GLUCAP in the last 168 hours. D-Dimer No results for input(s): DDIMER in the last 72 hours. Hgb A1c No results for input(s): HGBA1C in the last 72 hours. Lipid Profile No results for input(s): CHOL, HDL, LDLCALC, TRIG, CHOLHDL, LDLDIRECT in the last 72 hours. Thyroid function studies No results for input(s): TSH, T4TOTAL, T3FREE, THYROIDAB in the last 72 hours.  Invalid input(s): FREET3 Anemia work up No results for input(s): VITAMINB12, FOLATE, FERRITIN, TIBC, IRON, RETICCTPCT in the last 72 hours. Urinalysis    Component Value Date/Time   COLORURINE AMBER (A) 09/19/2018 1640   APPEARANCEUR HAZY (A) 09/19/2018 1640   LABSPEC 1.020 09/19/2018 1640   PHURINE 6.0 09/19/2018 1640   GLUCOSEU NEGATIVE 09/19/2018 1640   HGBUR NEGATIVE 09/19/2018 1640   BILIRUBINUR SMALL (A) 09/19/2018 1640   KETONESUR NEGATIVE 09/19/2018 1640   PROTEINUR 30 (A) 09/19/2018 1640   UROBILINOGEN 0.2 09/30/2014 1958   NITRITE NEGATIVE 09/19/2018 1640   LEUKOCYTESUR NEGATIVE 09/19/2018 1640   Sepsis Labs Invalid input(s): PROCALCITONIN,  WBC,  LACTICIDVEN Microbiology Recent Results (from the past 240 hour(s))  SARS CORONAVIRUS 2 (TAT 6-24 HRS) Nasopharyngeal Nasopharyngeal Swab     Status: None   Collection Time: 02/14/21  5:37 PM   Specimen: Nasopharyngeal Swab  Result Value Ref Range Status   SARS  Coronavirus 2 NEGATIVE NEGATIVE Final    Comment: (NOTE) SARS-CoV-2 target nucleic acids are NOT DETECTED.  The SARS-CoV-2 RNA is generally detectable in upper and lower respiratory specimens during the acute phase of infection. Negative results do not preclude SARS-CoV-2 infection, do not rule out co-infections with other pathogens, and should not be used as the sole basis for treatment or other patient  management decisions. Negative results must be combined with clinical observations, patient history, and epidemiological information. The expected result is Negative.  Fact Sheet for Patients: SugarRoll.be  Fact Sheet for Healthcare Providers: https://www.woods-mathews.com/  This test is not yet approved or cleared by the [place] FDA and  has been authorized for detection and/or diagnosis of SARS-CoV-2 by FDA under an Emergency Use Authorization (EUA). This EUA will remain  in effect (meaning this test can be used) for the duration of the COVID-19 declaration under Se ction 564(b)(1) of the Act, 21 U.S.C. section 360bbb-3(b)(1), unless the authorization is terminated or revoked sooner.  Performed at [facility], [place] [address] [place], [place] [address]   Culture, body fluid w Gram Stain-bottle     Status: None (Preliminary result)   Collection Time: 02/15/21 10:30 AM   Specimen: Peritoneal Washings  Result Value Ref Range Status   Specimen Description PERITONEAL  Final   Special Requests BOTTLES DRAWN AEROBIC AND ANAEROBIC 10CC  Final   Culture   Final    NO GROWTH < 24 HOURS Performed at [facility], [address] [place], [place] [address]    Report Status PENDING  Incomplete  Gram stain     Status: None   Collection Time: 02/15/21 10:30 AM   Specimen: Peritoneal Washings  Result Value Ref Range Status   Specimen Description PERITONEAL  Final   Special Requests NONE  Final   Gram Stain   Final    NO ORGANISMS  SEEN CYTOSPIN SMEAR WBC PRESENT, PREDOMINANTLY PMN Performed at [facility], [address] [place], [place] [address]    Report Status 02/15/2021 FINAL  Final     Time coordinating discharge: 35 minutes  SIGNED:   Rodena Goldmann, DO [facility] 02/16/2021, 2:23 PM  If 7PM-7AM, please contact night-coverage [email or web address]

## 2021-02-16 NOTE — Progress Notes (Signed)
Subjective:  Patient has no complaints.  He denies nausea vomiting chest pain shortness of breath or abdominal pain.  He states he had soft stool this morning.  No melena or rectal bleeding. He says his appetite is good.  He ate all of his breakfast and most of his lunch.  Current Medications:  Current Facility-Administered Medications:  .  (feeding supplement) PROSource Plus liquid 30 mL, 30 mL, Oral, BID BM, Shah, Pratik D, DO, 30 mL at 02/16/21 0820 .  enoxaparin (LOVENOX) injection 40 mg, 40 mg, Subcutaneous, Q24H, Emokpae, Ejiroghene E, MD, 40 mg at 02/15/21 2139 .  feeding supplement (ENSURE ENLIVE / ENSURE PLUS) liquid 237 mL, 237 mL, Oral, BID BM, Shah, Pratik D, DO, 237 mL at 02/16/21 0820 .  fluticasone furoate-vilanterol (BREO ELLIPTA) 100-25 MCG/INH 1 puff, 1 puff, Inhalation, Daily, Emokpae, Ejiroghene E, MD, 1 puff at 02/16/21 0716 .  folic acid (FOLVITE) tablet 1 mg, 1 mg, Oral, Daily, Emokpae, Ejiroghene E, MD, 1 mg at 02/16/21 0820 .  furosemide (LASIX) tablet 20 mg, 20 mg, Oral, Daily, Aliene Altes S, PA-C, 20 mg at 02/16/21 0820 .  LORazepam (ATIVAN) tablet 1-4 mg, 1-4 mg, Oral, Q1H PRN **OR** LORazepam (ATIVAN) injection 1-4 mg, 1-4 mg, Intravenous, Q1H PRN, Emokpae, Ejiroghene E, MD .  mirtazapine (REMERON) tablet 7.5 mg, 7.5 mg, Oral, QHS, Emokpae, Ejiroghene E, MD, 7.5 mg at 02/15/21 2139 .  multivitamin with minerals tablet 1 tablet, 1 tablet, Oral, Daily, Emokpae, Ejiroghene E, MD, 1 tablet at 02/16/21 0905 .  ondansetron (ZOFRAN) tablet 4 mg, 4 mg, Oral, Q6H PRN **OR** ondansetron (ZOFRAN) injection 4 mg, 4 mg, Intravenous, Q6H PRN, Emokpae, Ejiroghene E, MD .  polyethylene glycol (MIRALAX / GLYCOLAX) packet 17 g, 17 g, Oral, Daily PRN, Emokpae, Ejiroghene E, MD .  spironolactone (ALDACTONE) tablet 50 mg, 50 mg, Oral, Daily, Aliene Altes S, PA-C, 50 mg at 02/16/21 4098 .  thiamine tablet 100 mg, 100 mg, Oral, Daily, 100 mg at 02/16/21 0821 **OR** thiamine (B-1)  injection 100 mg, 100 mg, Intravenous, Daily, Emokpae, Ejiroghene E, MD .  traMADol (ULTRAM) tablet 50 mg, 50 mg, Oral, Q8H PRN, Emokpae, Ejiroghene E, MD .  umeclidinium bromide (INCRUSE ELLIPTA) 62.5 MCG/INH 1 puff, 1 puff, Inhalation, Daily, Emokpae, Ejiroghene E, MD, 1 puff at 02/16/21 0716  Objective: Blood pressure 92/65, pulse 95, temperature 98 F (36.7 C), temperature source Oral, resp. rate 18, height '5\' 4"'  (1.626 m), weight 64 kg, SpO2 99 %. Patient is alert and in no acute distress. He does not have asterixis.. Abdomen is full but soft and nontender with organomegaly or masses. No LE edema or clubbing noted.  Labs/studies Results:  CBC Latest Ref Rng & Units 02/16/2021 02/14/2021 01/14/2021  WBC 4.0 - 10.5 K/uL 7.2 10.2 10.0  Hemoglobin 13.0 - 17.0 g/dL 10.7(L) 11.0(L) 11.4(L)  Hematocrit 39.0 - 52.0 % 32.8(L) 33.1(L) 31.8(L)  Platelets 150 - 400 K/uL 235 280 274    CMP Latest Ref Rng & Units 02/16/2021 02/15/2021 02/14/2021  Glucose 70 - 99 mg/dL 124(H) 99 113(H)  BUN 8 - 23 mg/dL '15 13 12  ' Creatinine 0.61 - 1.24 mg/dL 0.85 0.84 0.89  Sodium 135 - 145 mmol/L 135 134(L) 132(L)  Potassium 3.5 - 5.1 mmol/L 4.0 3.7 3.1(L)  Chloride 98 - 111 mmol/L 102 100 98  CO2 22 - 32 mmol/L '26 25 24  ' Calcium 8.9 - 10.3 mg/dL 7.8(L) 7.9(L) 7.9(L)  Total Protein 6.5 - 8.1 g/dL 5.0(L) 5.2(L) 5.9(L)  Total  Bilirubin 0.3 - 1.2 mg/dL 0.7 0.5 0.6  Alkaline Phos 38 - 126 U/L 100 103 118  AST 15 - 41 U/L '19 21 23  ' ALT 0 - 44 U/L '11 11 12    ' Hepatic Function Latest Ref Rng & Units 02/16/2021 02/15/2021 02/14/2021  Total Protein 6.5 - 8.1 g/dL 5.0(L) 5.2(L) 5.9(L)  Albumin 3.5 - 5.0 g/dL 1.8(L) 1.9(L) 2.2(L)  AST 15 - 41 U/L '19 21 23  ' ALT 0 - 44 U/L '11 11 12  ' Alk Phosphatase 38 - 126 U/L 100 103 118  Total Bilirubin 0.3 - 1.2 mg/dL 0.7 0.5 0.6  Bilirubin, Direct 0.0 - 0.2 mg/dL - 0.1 -    Ascitic fluid analysis Gram stain no organisms seen; WBCs present predominantly PMNs. Cell count  430 Neutrophils 46%, lymphs 13% monocyte macrophages 39% and 2% mesothelial cells. Albumin 1.0. Culture reveals no growth at 24 hours Cytology is negative for malignant cells.  Abdominal pelvic CT from 02/15/2019 reviewed. Liver contour consistent with cirrhosis.  Moderate ascites and pancreatic head mass measuring 3.8 x 3.3 cm.  Assessment:  #1.  New onset of ascites.  Patient with underlying cirrhosis thought to be due to alcohol.  Viral markers are pending.  Other potential source would be malignant ascites but cytology is negative.  Protein content is low.  Patient is on low-dose furosemide and spironolactone.  Dose to be adjusted at the time of office visit if renal function remained stable.  If ascites reaccumulate rapidly would consider albumin infusion as serum albumin is quite low. Patient stable for discharge from GI standpoint.  #2.  Cirrhosis.  Cirrhosis presumed to be due to prior alcohol use.  Viral markers for hepatitis are pending.  Recent EGD revealed portal gastropathy but no evidence of esophageal or gastric varices.  #3.  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  No evidence of biliary obstruction.  Transaminases and bilirubin are normal.  He is due for his chemotherapy.  He is to get in contact with Dr. Benay Spice.  #4.  He has a history of iron deficiency anemia.  He had EGD on 01/06/2021 and colonoscopy on 01/27/2021 revealing nonbleeding internal hemorrhoids and 4 polyps which are all tubular adenomas.  Recommendations  Patient aware to stay on low-sodium diet. Continue furosemide 20 mg daily and spironolactone 50 mg daily. Will arrange for office visit with Dr. Abbey Chatters in 2 to 3 weeks.

## 2021-02-17 LAB — AFP TUMOR MARKER: AFP, Serum, Tumor Marker: 2.5 ng/mL (ref 0.0–8.4)

## 2021-02-17 LAB — PH, BODY FLUID: pH, Body Fluid: 7.4

## 2021-02-20 LAB — CULTURE, BODY FLUID W GRAM STAIN -BOTTLE: Culture: NO GROWTH

## 2021-03-02 ENCOUNTER — Encounter: Payer: Self-pay | Admitting: Internal Medicine

## 2021-03-02 ENCOUNTER — Ambulatory Visit: Payer: Medicare Other | Admitting: Internal Medicine

## 2021-03-23 ENCOUNTER — Telehealth: Payer: Self-pay

## 2021-03-23 ENCOUNTER — Encounter: Payer: Self-pay | Admitting: Gastroenterology

## 2021-03-23 ENCOUNTER — Other Ambulatory Visit: Payer: Self-pay

## 2021-03-23 ENCOUNTER — Ambulatory Visit (INDEPENDENT_AMBULATORY_CARE_PROVIDER_SITE_OTHER): Admitting: Gastroenterology

## 2021-03-23 VITALS — BP 80/52 | HR 120 | Temp 97.5°F | Ht 64.0 in | Wt 146.2 lb

## 2021-03-23 DIAGNOSIS — K7031 Alcoholic cirrhosis of liver with ascites: Secondary | ICD-10-CM

## 2021-03-23 NOTE — Telephone Encounter (Signed)
Ronald [name] The pt's sister called back to say that the pt is on Furosemide 20 mg x 1 daily and Spironolactone 25 mg x 2 once a day. States if you have any questions please call her.

## 2021-03-23 NOTE — Patient Instructions (Signed)
Please have blood work done.  Please call me with the names of the fluid pills you are taking (we are wondering if you are taking furosemide and spironolactone).   We are arranging another paracentesis shortly.  Make sure to follow a low salt diet (no more than 2 grams per day) if possible.   We will see you in 6-8 weeks!  I enjoyed seeing you again today! As you know, I value our relationship and want to provide genuine, compassionate, and quality care. I welcome your feedback. If you receive a survey regarding your visit,  I greatly appreciate you taking time to fill this out. See you next time!  Annitta Needs, PhD, ANP-BC [facility]

## 2021-03-23 NOTE — Progress Notes (Signed)
Referring Provider: [facility]* Primary Care Physician:  [facility]: Dr. Abbey Chatters    Chief Complaint  Patient presents with  . Follow-up    Stomach pain    HPI:   Ronald [name] is a 65 y.o. 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 Acute hepatitis panel non-reactive. [name] Hep A/B immunizations. No SBP on ascitic fluid. CT Imaging in April 2022 during hospitalization with development of mild to moderate ascites, cirrhosis, stable appearance of pancreatic head mass. Also noted 3.8 by 3.3 complex low density area anterior and inferior to pancreatic body, decreased from prior exam, query mass or pseudocyst. [place] para with 3.8 liters removed during hospitalization.   Has lower abdominal pain, "aggravating". 9/10 pain. Takes a pain pill and it goes away. Started after leaving the hospital and went home. No fever/chills. No confusion or mental status changes. No ETOH. Last drink about a month ago. No constipation or diarrhea. No rectal bleeding. Diuretic therapy reported after visit: lasix 20 mg and aldactone 50 mg daily. He was unsure what he was taking at time of visit.  BP 102/66, repeat 80/52. Pulse 120. He states he took a nebulizer treatment in the morning and is convinced this has caused his pulse to be elevated (several hours ago). Nebulizer prior to visit.   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 Duodenal mucosa proximal to major papilla orifice friable and edematous appearing. Lymphangiectasia.      Past Medical History:  Diagnosis Date  . Alcoholism ([place])   . Anemia    iron deficiency  . Cancer ([place])    lung Stage I- 10/11/20  . Collapse of left lung    lower lobe   . COPD (chronic obstructive pulmonary disease) ([place])   . Depression   . Dyspnea    Exertion  . Family history of adverse reaction to anesthesia    sister had a bad headache  . Hypertension    10/11/20 - not in years  . Mediastinal adenopathy     and endobronchial lesion  . Pneumonia   . TB (pulmonary tuberculosis)   . Wears dentures     Past Surgical History:  Procedure Laterality Date  . BIOPSY  01/06/2021   Procedure: BIOPSY;  Surgeon: Milus Banister, MD;  Location: [facility];  Service: Endoscopy;;  . COLONOSCOPY WITH PROPOFOL N/A 01/27/2021    non-bleeding internal hemorrhoids, four sessile polyp 5-8 mm in size, tubular adenomas  . ESOPHAGOGASTRODUODENOSCOPY (EGD) WITH PROPOFOL N/A 10/28/2020   Procedure: ESOPHAGOGASTRODUODENOSCOPY (EGD) WITH PROPOFOL;  Surgeon: Milus Banister, MD;  Location: [facility];  Service: Endoscopy;  Laterality: N/A;  . 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 Duodenal mucosa proximal to major papilla orifice friable and edematous appearing. Lymphangiectasia.   . EUS N/A 10/28/2020   Procedure: UPPER ENDOSCOPIC ULTRASOUND (EUS) RADIAL;  Surgeon: Milus Banister, MD;  Location: [facility];  Service: Endoscopy;  Laterality: N/A;  . FINE NEEDLE ASPIRATION N/A 10/28/2020   Procedure: FINE NEEDLE ASPIRATION (FNA) LINEAR;  Surgeon: Milus Banister, MD;  Location: [facility];  Service: Endoscopy;  Laterality: N/A;  . MULTIPLE TOOTH EXTRACTIONS    . POLYPECTOMY  01/27/2021   Procedure: POLYPECTOMY INTESTINAL;  Surgeon: Eloise Harman, DO;  [facility];  Service: Endoscopy;;  . VIDEO BRONCHOSCOPY N/A 10/12/2020   Procedure: VIDEO BRONCHOSCOPY WITH CRYOTHERAPY AND BALLOON DILATATION;  Surgeon: Garner Nash, DO;  Location: [place];  Service: Pulmonary;  Laterality: N/A;  . VIDEO BRONCHOSCOPY WITH ENDOBRONCHIAL ULTRASOUND N/A 10/02/2018   Procedure: VIDEO BRONCHOSCOPY WITH  ENDOBRONCHIAL ULTRASOUND;  Surgeon: Garner Nash, DO;  Location: [place];  Service: Thoracic;  Laterality: N/A;  . VIDEO BRONCHOSCOPY WITH ENDOBRONCHIAL ULTRASOUND N/A 10/12/2020   Procedure: VIDEO BRONCHOSCOPY WITH ENDOBRONCHIAL ULTRASOUND;  Surgeon: Garner Nash, DO;  Location: [place];  Service: Pulmonary;  Laterality: N/A;    Current Outpatient Medications  Medication Sig Dispense Refill  . albuterol (PROVENTIL) (2.5 MG/3ML) 0.083% nebulizer solution TAKE 3ML BY MOUTH VIA NEBULIZER EVERY 4 HOURS AS NEEDED FOR WHEEZING OR SHORTNESS OF BREATH (Patient taking differently: Take 2.5 mg by nebulization every 4 (four) hours as needed for shortness of breath.) 75 mL 3  . albuterol (VENTOLIN HFA) 108 (90 Base) MCG/ACT inhaler INHALE 2 PUFFS BY MOUTH EVERY 6 HOURS AS NEEDED FOR SHORTNESS OF BREATH (Patient taking differently: Inhale 2 puffs into the lungs every 6 (six) hours as needed for shortness of breath.) 18 g 5  . ascorbic acid (VITAMIN C) 500 MG tablet Take 500 mg by mouth daily.    . ATIVAN 0.5 MG tablet Take 0.5 mg by mouth as needed.    . cholecalciferol (VITAMIN D3) 25 MCG (1000 UNIT) tablet Take 1,000 Units by mouth daily.    . feeding supplement, ENSURE ENLIVE, (ENSURE ENLIVE) LIQD Take 237 mLs by mouth 2 (two) times daily between meals. (Patient taking differently: Take 237 mLs by mouth daily.) 237 mL 12  . fexofenadine (ALLEGRA) 180 MG tablet Take 180 mg by mouth daily.    . Fluticasone-Umeclidin-Vilant (TRELEGY ELLIPTA) 100-62.5-25 MCG/INH AEPB Inhale 1 puff into the lungs daily. 60 each 3  . furosemide (LASIX) 20 MG tablet Take 1 tablet (20 mg total) by mouth daily. 30 tablet 2  . oxyCODONE (OXY IR/ROXICODONE) 5 MG immediate release tablet Take 5 mg by mouth as needed.    . potassium chloride SA (KLOR-CON) 20 MEQ tablet Take 1 tablet (20 mEq total) by mouth 2 (two) times daily. 60 tablet 0  . spironolactone (ALDACTONE) 25 MG tablet Take 2 tablets (50 mg total) by mouth daily. 60  tablet 2  . mirtazapine (REMERON) 7.5 MG tablet Take 1 tablet (7.5 mg total) by mouth at bedtime. (Patient not taking: Reported on 03/23/2021) 30 tablet 2   No current facility-administered medications for this visit.    Allergies as of 03/23/2021  . (No Known Allergies)    Family History  Problem Relation Age of Onset  . Heart disease Mother   . Heart disease Father   . Diabetes Other   . Hypertension Other   . Ovarian cancer Maternal Grandmother   . Liver disease Neg Hx   . Colon cancer Neg Hx     Social History   Socioeconomic History  . Marital status: Divorced    Spouse name: Not on file  . Number of children: Not on file  . Years of education: Not on file  . Highest education level: Not on file  Occupational History  . Not on file  Tobacco Use  . Smoking status: Former Smoker    Packs/day: 2.00    Types: Cigarettes    Quit date: 09/19/2018    Years since quitting: 2.5  .  Smokeless tobacco: Current User    Types: Snuff  Vaping Use  . Vaping Use: Never used  Substance and Sexual Activity  . Alcohol use: Yes    Alcohol/week: 4.0 standard drinks    Types: 4 Cans of beer per week    Comment: occas  . Drug use: No  . Sexual activity: Never  Other Topics Concern  . Not on file  Social History Narrative  . Not on file   Social Determinants of Health   Financial Resource Strain: Not on file  Food Insecurity: Not on file  Transportation [name]: Not on file  Physical Activity: Not on file  Stress: Not on file  Social Connections: Not on file    Review of Systems: See HPI   Physical Exam: BP (!) 80/52 (BP Location: Right Arm, Patient Position: Sitting, Cuff Size: Normal)   Pulse (!) 120   Temp (!) 97.5 F (36.4 C) (Temporal)   Ht 5\' 4"  (1.626 m)   Wt 146 lb 3.2 oz (66.3 kg)   BMI 25.10 kg/m  General:   Alert and oriented. Chronically ill-appearing, sallow complexion Head:  Normocephalic and atraumatic. Eyes:  Conjuctiva clear without scleral  icterus. Mouth:  Mask in place Abdomen:  Unable to get on exam table. +BS, protuberant, tense ascites, small umbilical hernia protruding Msk:  Symmetrical without gross deformities. Normal posture. Extremities:  Without edema. Neurologic:  Alert and  oriented x4 Psych:  Alert and cooperative.   ASSESSMENT: Ronald [name] is a 65 y.o. 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 Acute hepatitis panel non-reactive.  No SBP on ascitic fluid. CT Imaging in April 2022 during hospitalization with development of mild to moderate ascites, cirrhosis, stable appearance of pancreatic head mass. Also noted 3.8 by 3.3 complex low density area anterior and inferior to pancreatic body, decreased from prior exam, query mass or pseudocyst. [place] para with 3.8 liters removed during hospitalization. Returns for follow-up.   Noting today lower abdominal discomfort and I suspect secondary to tense ascites; although he notes 9/10 pain, his clinical exam and demeanor is not consistent with this. At time of visit, he was unsure what diuretic regimen he was taking but sister called after visit reporting Lasix 20 mg and Aldactone 50 mg. He is tachycardic and hypotensive, with pulse 120 and BP initially 102/66 with repeat 80/52. I discussed with him presenting to the ED in light of presentation, but he is declining this. We discussed need for medical attention, and his sister was present with him and witnessed our discussion with him regarding need for ED evaluation. He is declining this. Mentating well, able to make his own decisions, and he has elected to go home and aware of further signs/symptoms that would prompt medical attention.   He has elected to forgo any further chemo as well. I have messaged the Oncology nurse regarding this.     PLAN:  CBC, CMP, INR ordered US para in near  future. I have requested Albumin 25 grams IV prior to para and again 50 grams at 5 liters [name] Hep A/B immunizations: prescription provided Recommending ED evaluation but declining this. Sister is present and patient able to make own decisions He has palliative care involved who see him regularly: I have also asked him to check his BP at home At time of visit did not know diuretic regimen, so will reach  out to sister now and inquire update about BP and hold diuretic therapy if remaining notably hypotensive and refusing ED evaluation   Ronald Needs, PhD, ANP-BC [facility]

## 2021-03-24 ENCOUNTER — Telehealth: Payer: Self-pay | Admitting: Gastroenterology

## 2021-03-24 ENCOUNTER — Encounter: Payer: Self-pay | Admitting: Gastroenterology

## 2021-03-24 ENCOUNTER — Ambulatory Visit (HOSPITAL_COMMUNITY)
Admission: RE | Admit: 2021-03-24 | Discharge: 2021-03-24 | Disposition: A | Discharge status: Home / Self Care | Payer: Medicare Other | Source: Ambulatory Visit | Attending: Gastroenterology | Admitting: Gastroenterology

## 2021-03-24 ENCOUNTER — Other Ambulatory Visit: Payer: Self-pay

## 2021-03-24 DIAGNOSIS — K7031 Alcoholic cirrhosis of liver with ascites: Secondary | ICD-10-CM | POA: Diagnosis present

## 2021-03-24 LAB — BODY FLUID CELL COUNT WITH DIFFERENTIAL
Eos, Fluid: 0 %
Lymphs, Fluid: 27 %
Monocyte-Macrophage-Serous Fluid: 35 % — ABNORMAL LOW (ref 50–90)
Neutrophil Count, Fluid: 38 % — ABNORMAL HIGH (ref 0–25)
Other Cells, Fluid: 3 %
Total Nucleated Cell Count, Fluid: 149 cu mm (ref 0–1000)

## 2021-03-24 LAB — GRAM STAIN

## 2021-03-24 NOTE — Telephone Encounter (Signed)
Phoned LMOVM to return call

## 2021-03-24 NOTE — Telephone Encounter (Signed)
Dena/[facility] nurse:  Can we find out how patient is doing? He was hypotensive yesterday but declined ED evaluation. We did not know what diuretic regimen he was on yesterday, but he has since updated this.  Please find out what BP is at home. I would like for him to HOLD diuretic therapy today and tomorrow until nursing staff with palliative can see him and reassess BP. Let me know BP today if he is aware. If he is remaining with SBP less than 100, he needs to present to the ED (we recommended this yesterday but declined).

## 2021-03-24 NOTE — Telephone Encounter (Signed)
Pt's sister/pt returned call after leaving yesterday his BP came down. She checked it this morning and it was normal she said (didn't give numbers). States they just came from having a gallon of fluid drawn off pt today. Advised of your instructions regarding holding diuretics and when ED is needed.

## 2021-03-24 NOTE — Procedures (Signed)
   US guided RLQ paracentesis  4.1 L yellow fluid Sent for labs per MD Tolerated well  EBL: none

## 2021-03-25 LAB — PATHOLOGIST SMEAR REVIEW

## 2021-03-29 LAB — CULTURE, BODY FLUID W GRAM STAIN -BOTTLE: Culture: NO GROWTH

## 2021-03-29 NOTE — Telephone Encounter (Signed)
Thanks, Coralyn Helling! Do they have updated BP from when the nurses came? If improved, we can resume low dose diuretics that he was on.

## 2021-03-29 NOTE — Telephone Encounter (Signed)
Phoned and LMOVM for the pt/sister to return call regarding blood pressure readings

## 2021-03-30 ENCOUNTER — Encounter: Payer: Self-pay | Admitting: Oncology

## 2021-03-30 ENCOUNTER — Ambulatory Visit: Payer: Medicare Other | Admitting: Pulmonary Disease

## 2021-03-30 LAB — COMPLETE METABOLIC PANEL WITH GFR
AG Ratio: 0.6 (calc) — ABNORMAL LOW (ref 1.0–2.5)
ALT: 8 U/L — ABNORMAL LOW (ref 9–46)
AST: 20 U/L (ref 10–35)
Albumin: 2.3 g/dL — ABNORMAL LOW (ref 3.6–5.1)
Alkaline phosphatase (APISO): 198 U/L — ABNORMAL HIGH (ref 35–144)
BUN: 12 mg/dL (ref 7–25)
CO2: 26 mmol/L (ref 20–32)
Calcium: 7.7 mg/dL — ABNORMAL LOW (ref 8.6–10.3)
Chloride: 99 mmol/L (ref 98–110)
Creat: 0.85 mg/dL (ref 0.70–1.25)
GFR, Est African American: 105 mL/min/{1.73_m2} (ref >=60)
GFR, Est Non African American: 91 mL/min/{1.73_m2} (ref >=60)
Globulin: 3.8 g/dL (calc) — ABNORMAL HIGH (ref 1.9–3.7)
Glucose, Bld: 110 mg/dL — ABNORMAL HIGH (ref 65–99)
Potassium: 3.8 mmol/L (ref 3.5–5.3)
Sodium: 137 mmol/L (ref 135–146)
Total Bilirubin: 0.6 mg/dL (ref 0.2–1.2)
Total Protein: 6.1 g/dL (ref 6.1–8.1)

## 2021-03-30 LAB — CBC WITH DIFFERENTIAL/PLATELET
Absolute Monocytes: 713 cells/uL (ref 200–950)
Basophils Absolute: 49 cells/uL (ref 0–200)
Basophils Relative: 0.6 %
Eosinophils Absolute: 172 cells/uL (ref 15–500)
Eosinophils Relative: 2.1 %
HCT: 34.8 % — ABNORMAL LOW (ref 38.5–50.0)
Hemoglobin: 11.3 g/dL — ABNORMAL LOW (ref 13.2–17.1)
Lymphs Abs: 1353 cells/uL (ref 850–3900)
MCH: 31.3 pg (ref 27.0–33.0)
MCHC: 32.5 g/dL (ref 32.0–36.0)
MCV: 96.4 fL (ref 80.0–100.0)
MPV: 10 fL (ref 7.5–12.5)
Monocytes Relative: 8.7 %
Neutro Abs: 5912 cells/uL (ref 1500–7800)
Neutrophils Relative %: 72.1 %
Platelets: 321 10*3/uL (ref 140–400)
RBC: 3.61 10*6/uL — ABNORMAL LOW (ref 4.20–5.80)
RDW: 13.6 % (ref 11.0–15.0)
Total Lymphocyte: 16.5 %
WBC: 8.2 10*3/uL (ref 3.8–10.8)

## 2021-03-30 LAB — PROTIME-INR
INR: 1.2 — ABNORMAL HIGH
Prothrombin Time: 11.9 s — ABNORMAL HIGH (ref 9.0–11.5)

## 2021-03-31 LAB — ACID FAST SMEAR (AFB, MYCOBACTERIA): Acid Fast Smear: NEGATIVE

## 2021-04-01 NOTE — Telephone Encounter (Signed)
Phoned and spoke with the pt and was advised his BP did come down but he didn't remember what it was. The nurses are coming back out on Monday and he will have them write it down then. I will return the call to them on Tuesday when I return to work

## 2021-04-05 NOTE — Telephone Encounter (Signed)
Phoned and LMOVM for the pt/sister to return call regarding BP readings.

## 2021-04-06 ENCOUNTER — Encounter: Payer: Self-pay | Admitting: Oncology

## 2021-04-06 NOTE — Telephone Encounter (Signed)
Letter sent to the pt regarding his BP readings.

## 2021-04-18 ENCOUNTER — Encounter: Payer: Self-pay | Admitting: Oncology

## 2021-04-25 ENCOUNTER — Ambulatory Visit: Payer: Medicare Other | Admitting: Pulmonary Disease

## 2021-04-25 DIAGNOSIS — K8689 Other specified diseases of pancreas: Secondary | ICD-10-CM | POA: Insufficient documentation

## 2021-04-25 NOTE — Progress Notes (Deleted)
Synopsis: Referred in December 2019 for lower lobe lung mass by Dr. Halford Chessman.  Subjective:   PATIENT ID: Ronald [name] GENDER: male DOB: 12-24-1955, MRN: [id number]  No chief complaint on file.  Patient was recently admitted to the hospital and found to have an obstructing left lower lobe mass. The patient was taken for bronchoscopy on 10/02/2018 for evaluation of the left lower lobe endobronchial lung mass and mediastinal adenopathy.  Patient underwent under endobronchial ultrasound-guided transbronchial needle aspiration biopsies of station 4L and 7 as well as forcep biopsies of the left lower lobe endobronchial mass, cytology washing, and brushings of the left lower lobe.  Both lymph nodes station 7 and 4L were negative for malignancy and to the left lower lobe endobronchial mass samplings were consistent with squamous cell carcinoma.  The patient's hospitalization he has quit smoking.  Patient had PET scan completed this morning which revealed a T2 N0 M0 disease with no evidence of metastatic focus.  PFTs were completed which reveals severe COPD, FEV1 1.37 L, 49% predicted, DLCO 59% predicted.  Currently managed with albuterol as needed.  Baseline functional status he is self-sufficient able to care for himself at this time.  Over the past couple weeks has been sick and a little debilitated.  Overall doing much better since hospitalization.  Patient tolerated bronchoscopy well.  Patient denies hemoptysis.  OV 05/01/2019: Since his diagnosis of lung cancer patient has been followed by [facility] closely.  Has underwent chemotherapy for clinical stage Ib (T2N0) with radiation in January 2020 followed by 6 cycles of Taxol carboplatinum.  Started on durvalumab in May 2020. Today, patient has been doing well.  He did state that once he started immunotherapy he felt sick afterwards.  He is not sure that he wants to continue that.  I told him to have that discussion with his oncologist.  As for his  breathing he has been using his as needed albuterol.  He does state that [name] is affordable and does make his breathing better.  He would like refills of this today.  We are happy to do so.  We also gave him some samples to help reduced in his pharmacy cost.  Overall he only has significant dyspnea with exertion.  Otherwise is able to complete his activities of daily living.  He states that he has still quit smoking.  OV 09/15/2020: This is a 65 year old gentleman here for follow-up.  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  He had pulmonary function tests with a reduced FEV1.  He had chemotherapy and radiation.  Follows with Dr. Benay Spice.  Patient was last seen by medical oncology in June 2020.  He was started on immunotherapy.  He did not do well with the immunotherapy and felt like it made him feel bad.  At the time he was also drinking 3-5 beers per day.  He had a 0% PD-L1 expression.  He was started on Darvulumab.  He had 2 cycles of this.  And at that point per the patient he decided not to return to see the oncologist.  And subsequently has not had follow-up till seeing me in July 2020.  He is currently on Stiolto plus as needed albuterol for his COPD.  And has failed to follow-up for greater than a year and now returns today to discuss next steps.  OV 12/20/2020: Here today for follow-up after recent bronchoscopy and restaging of  squamous cell carcinoma. Patient has reestablished care with medical oncology. Last seen by them on 12/03/2020 office note reviewed, Ned Card, NP. Patient was restarted on treatments with Taxol/carboplatinum/pembrolizumab. This was started on 11/12/2020. Initially when we first diagnosed his lung cancer in 2019 was treated as a T2N0 disease with radiation +6 cycles of Taxol plus carboplatinum. Overall doing well with initiation of his repeat chemotherapy. Patient states he has completed 2 cycles at this point. He does have  some shortness of breath. He was having trouble using his previous inhaler. This is mainly mechanical issue and had trouble making sure he was even using the Stiolto device correctly. He still uses his albuterol as needed for shortness of breath and wheezing.  OV 04/25/2021: Here today for follow-up.  Patient was taken back for bronchoscopy in February 2022.  Recurrence of disease after radiation and started on chemotherapy.  Patient had recent hospitalization in April 2022 found to have worsening ascites concern for alcohol use.  Had 3.8 L removed from abdomen.  Had follow-up with gastroenterology discharge, 03/23/2021 office note reviewed.  Last seen in the office by Dr. Benay Spice on 12/24/2020 office note reviewed. ***  Oncology History  Lung cancer ([place])  11/05/2018 Initial Diagnosis   Lung cancer ([place])    11/05/2018 Cancer Staging   Staging form: Lung, AJCC 8th Edition - Clinical: cT2, cN0, cM0 - Signed by Ladell Pier, MD on 11/05/2018    11/22/2018 - 12/27/2018 Chemotherapy   The patient had palonosetron (ALOXI) injection 0.25 mg, 0.25 mg, Intravenous,  Once, 1 of 1 cycle Administration: 0.25 mg (11/22/2018), 0.25 mg (11/29/2018), 0.25 mg (12/06/2018), 0.25 mg (12/13/2018), 0.25 mg (12/20/2018), 0.25 mg (12/27/2018) CARBOplatin (PARAPLATIN) 190 mg in sodium chloride 0.9 % 250 mL chemo infusion, 190 mg (97.5 % of original dose 197.6 mg), Intravenous,  Once, 1 of 1 cycle Dose modification:   (original dose 197.6 mg, Cycle 1) Administration: 190 mg (11/22/2018), 210 mg (11/29/2018), 210 mg (12/06/2018), 210 mg (12/13/2018), 210 mg (12/20/2018), 150 mg (12/27/2018) PACLitaxel (TAXOL) 84 mg in sodium chloride 0.9 % 250 mL chemo infusion (</= 84m/m2), 50 mg/m2 = 84 mg, Intravenous,  Once, 1 of 1 cycle Administration: 84 mg (11/22/2018), 84 mg (11/29/2018), 84 mg (12/06/2018), 84 mg (12/13/2018), 84 mg (12/20/2018), 84 mg (12/27/2018)   for chemotherapy treatment.     03/28/2019 - 04/11/2019 Chemotherapy   The patient  had durvalumab (IMFINZI) 620 mg in sodium chloride 0.9 % 100 mL chemo infusion, 9.4 mg/kg = 660 mg, Intravenous,  Once, 2 of 6 cycles Administration: 620 mg (03/28/2019), 620 mg (04/11/2019)   for chemotherapy treatment.     11/12/2020 -  Chemotherapy    Patient is on Treatment Plan: LUNG NSCLC CARBOPLATIN + PACLITAXEL + PEMBROLIZUMAB Q21D X 4 CYCLES / PEMBROLIZUMAB MAINTENANCE Q21D       Endobronchial cancer, left ([facility]  10/12/2020 Initial Diagnosis   Endobronchial cancer, left ([facility]    11/03/2020 Cancer Staging   Staging form: Lung, AJCC 8th Edition - Clinical: Stage IV (pM1) - Signed by SLadell Pier MD on 11/03/2020       Past Medical History:  Diagnosis Date   Alcoholism ([facility]    Anemia    iron deficiency   Cancer ([facility]    lung Stage I- 10/11/20   Collapse of left lung    lower lobe   COPD (chronic obstructive pulmonary disease) (HCC)    Depression    Dyspnea    Exertion   Family history of  adverse reaction to anesthesia    sister had a bad headache   Hypertension    10/11/20 - not in years   Mediastinal adenopathy     and endobronchial lesion   Pneumonia    TB (pulmonary tuberculosis)    Wears dentures      Family History  Problem Relation Age of Onset   Heart disease Mother    Heart disease Father    Diabetes Other    Hypertension Other    Ovarian cancer Maternal Grandmother    Liver disease Neg Hx    Colon cancer Neg Hx      Past Surgical History:  Procedure Laterality Date   BIOPSY  01/06/2021   Procedure: BIOPSY;  Surgeon: Milus Banister, MD;  Location: [facility];  Service: Endoscopy;;   COLONOSCOPY WITH PROPOFOL N/A 01/27/2021    non-bleeding internal hemorrhoids, four sessile polyp 5-8 mm in size, tubular adenomas   ESOPHAGOGASTRODUODENOSCOPY (EGD) WITH PROPOFOL N/A 10/28/2020   Procedure: ESOPHAGOGASTRODUODENOSCOPY (EGD) WITH PROPOFOL;  Surgeon: Milus Banister, MD;  Location: [facility];  Service: Endoscopy;  Laterality: N/A;    ESOPHAGOGASTRODUODENOSCOPY (EGD) WITH PROPOFOL N/A 01/06/2021   portal gastropathy, no varices. Duodenal mucosa proximal to major papilla orifice friable and edematous appearing. Lymphangiectasia.    EUS N/A 10/28/2020   Procedure: UPPER ENDOSCOPIC ULTRASOUND (EUS) RADIAL;  Surgeon: Milus Banister, MD;  Location: [facility];  Service: Endoscopy;  Laterality: N/A;   FINE NEEDLE ASPIRATION N/A 10/28/2020   Procedure: FINE NEEDLE ASPIRATION (FNA) LINEAR;  Surgeon: Milus Banister, MD;  Location: [facility];  Service: Endoscopy;  Laterality: N/A;   MULTIPLE TOOTH EXTRACTIONS     POLYPECTOMY  01/27/2021   Procedure: POLYPECTOMY INTESTINAL;  Surgeon: Eloise Harman, DO;  [facility];  Service: Endoscopy;;   VIDEO BRONCHOSCOPY N/A 10/12/2020   Procedure: VIDEO BRONCHOSCOPY WITH CRYOTHERAPY AND BALLOON DILATATION;  Surgeon: Garner Nash, DO;  Location: [facility];  Service: Pulmonary;  Laterality: N/A;   VIDEO BRONCHOSCOPY WITH ENDOBRONCHIAL ULTRASOUND N/A 10/02/2018   Procedure: VIDEO BRONCHOSCOPY WITH ENDOBRONCHIAL ULTRASOUND;  Surgeon: Garner Nash, DO;  Location: [facility];  Service: Thoracic;  Laterality: N/A;   VIDEO BRONCHOSCOPY WITH ENDOBRONCHIAL ULTRASOUND N/A 10/12/2020   Procedure: VIDEO BRONCHOSCOPY WITH ENDOBRONCHIAL ULTRASOUND;  Surgeon: Garner Nash, DO;  Location: [facility];  Service: Pulmonary;  Laterality: N/A;    Social History   Socioeconomic History   Marital status: Divorced    Spouse name: Not on file   Number of children: Not on file   Years of education: Not on file   Highest education level: Not on file  Occupational History   Not on file  Tobacco Use   Smoking status: Former    Packs/day: 2.00    Pack years: 0.00    Types: Cigarettes    Quit date: 09/19/2018    Years since quitting: 2.6   Smokeless tobacco: Current    Types: Snuff  Vaping Use   Vaping Use: Never used  Substance and Sexual Activity   Alcohol use: Yes    Alcohol/week: 4.0  standard drinks    Types: 4 Cans of beer per week    Comment: occas   Drug use: No   Sexual activity: Never  Other Topics Concern   Not on file  Social History Narrative   Not on file   Social Determinants of Health   Financial Resource Strain: Not on file  Food Insecurity: Not on file  Transportation  Needs: Not on file  Physical Activity: Not on file  Stress: Not on file  Social Connections: Not on file  Intimate Partner Violence: Not on file     No Known Allergies   Outpatient Medications Prior to Visit  Medication Sig Dispense Refill   albuterol (PROVENTIL) (2.5 MG/3ML) 0.083% nebulizer solution TAKE 3ML BY MOUTH VIA NEBULIZER EVERY 4 HOURS AS NEEDED FOR WHEEZING OR SHORTNESS OF BREATH (Patient taking differently: Take 2.5 mg by nebulization every 4 (four) hours as needed for shortness of breath.) 75 mL 3   albuterol (VENTOLIN HFA) 108 (90 Base) MCG/ACT inhaler INHALE 2 PUFFS BY MOUTH EVERY 6 HOURS AS NEEDED FOR SHORTNESS OF BREATH (Patient taking differently: Inhale 2 puffs into the lungs every 6 (six) hours as needed for shortness of breath.) 18 g 5   ascorbic acid (VITAMIN C) 500 MG tablet Take 500 mg by mouth daily.     ATIVAN 0.5 MG tablet Take 0.5 mg by mouth as needed.     cholecalciferol (VITAMIN D3) 25 MCG (1000 UNIT) tablet Take 1,000 Units by mouth daily.     feeding supplement, ENSURE ENLIVE, (ENSURE ENLIVE) LIQD Take 237 mLs by mouth 2 (two) times daily between meals. (Patient taking differently: Take 237 mLs by mouth daily.) 237 mL 12   fexofenadine (ALLEGRA) 180 MG tablet Take 180 mg by mouth daily.     Fluticasone-Umeclidin-Vilant (TRELEGY ELLIPTA) 100-62.5-25 MCG/INH AEPB Inhale 1 puff into the lungs daily. 60 each 3   furosemide (LASIX) 20 MG tablet Take 1 tablet (20 mg total) by mouth daily. 30 tablet 2   mirtazapine (REMERON) 7.5 MG tablet Take 1 tablet (7.5 mg total) by mouth at bedtime. (Patient not taking: Reported on 03/23/2021) 30 tablet 2   oxyCODONE  (OXY IR/ROXICODONE) 5 MG immediate release tablet Take 5 mg by mouth as needed.     potassium chloride SA (KLOR-CON) 20 MEQ tablet Take 1 tablet (20 mEq total) by mouth 2 (two) times daily. 60 tablet 0   spironolactone (ALDACTONE) 25 MG tablet Take 2 tablets (50 mg total) by mouth daily. 60 tablet 2   No facility-administered medications prior to visit.    ROS   Objective:   Physical Exam     There were no vitals filed for this visit.    on RA BMI Readings from Last 3 Encounters:  03/23/21 25.10 kg/m  02/14/21 24.20 kg/m  01/12/21 26.09 kg/m   Wt Readings from Last 3 Encounters:  03/23/21 146 lb 3.2 oz (66.3 kg)  02/14/21 141 lb (64 kg)  01/12/21 152 lb (68.9 kg)     CBC    Component Value Date/Time   WBC 8.2 03/29/2021 0805   RBC 3.61 (L) 03/29/2021 0805   HGB 11.3 (L) 03/29/2021 0805   HGB 11.6 (L) 12/24/2020 0726   HCT 34.8 (L) 03/29/2021 0805   PLT 321 03/29/2021 0805   PLT 225 12/24/2020 0726   MCV 96.4 03/29/2021 0805   MCH 31.3 03/29/2021 0805   MCHC 32.5 03/29/2021 0805   RDW 13.6 03/29/2021 0805   LYMPHSABS 1,353 03/29/2021 0805   MONOABS 1.0 02/14/2021 1339   EOSABS 172 03/29/2021 0805   BASOSABS 49 03/29/2021 0805    Chest Imaging: 10/08/2018 pet imaging No evidence of metastatic disease, T2 N0 M0 by pet imaging. The patient's images have been independently reviewed by me.    02/24/2019 CT chest: Persistent left lower lobe collapse, left infrahilar mass stable.  No significant progression of disease. The  patient's images have been independently reviewed by me.    09/30/2020: NM PET  Left hilar disease PET avid where previous malignancy was identified. Lesion within the pancreas already had work-up. The patient's images have been independently reviewed by me.     Pulmonary Functions Testing Results: PFT Results Latest Ref Rng & Units 10/07/2018 09/23/2018  FVC-Pre L 2.52 -  FVC-Predicted Pre % 68 35  FVC-Post L 2.28 -  FVC-Predicted  Post % 61 -  Pre FEV1/FVC % % 56 56  Post FEV1/FCV % % 60 -  FEV1-Pre L 1.41 0.74  FEV1-Predicted Pre % 50 26  FEV1-Post L 1.37 -  DLCO uncorrected ml/min/mmHg 14.53 -  DLCO UNC% % 59 -  DLVA Predicted % 93 -    FeNO: None   Pathology:    Bronchoscopy image: 100% occlusion of the LLL Tumor at the level of the left main subcarina   08/02/2018: Bronchoscopy pathology Cytology, washings, brushings and forcep biopsies of the left lower lobe endobronchial mass consistent with squamous cell carcinoma Station 4L and 7 negative for malignancy  Echocardiogram: None   Heart Catheterization: None     Assessment & Plan:      ICD-10-CM   1. Squamous cell carcinoma lung, left (HCC)  C34.92     2. Endobronchial cancer, left ([place])  C34.92     3. Obstructive pneumonia  J18.9     4. Stage 3 severe COPD by GOLD classification ([place])  J44.9     5. S/P bronchoscopy  Z98.890     6. Alcoholic cirrhosis of liver with ascites ([place])  K70.31     7. Mediastinal adenopathy  R59.0     8. Pancreatic mass  K86.89       Discussion:  ***  65 year old gentleman, T2, N0, M0 squamous cell carcinoma of the lung status post initially 6 cycles of chemo plus radiation in 2019/2020. Now with recurrence of disease status post bronchoscopy in December 2021. Currently undergoing repeat chemo treatments with medical oncology. Baseline has severe COPD stage III FEV1 49% predicted, DLCO 59% predicted. He is a former smoker.  Plan: He needs to be on a daily maintenance inhaler. We discussed this today in the office New start to Trelegy 100 I hope this is a simpler medication for him to use. Continue albuterol for shortness of breath and wheezing He tried and failed Stiolto. Needs continued followed up with medical oncology regarding cancer treatments.  Patient to return to see me in 6 months for COPD follow-up.    Current Outpatient Medications:    albuterol (PROVENTIL) (2.5 MG/3ML) 0.083%  nebulizer solution, TAKE 3ML BY MOUTH VIA NEBULIZER EVERY 4 HOURS AS NEEDED FOR WHEEZING OR SHORTNESS OF BREATH (Patient taking differently: Take 2.5 mg by nebulization every 4 (four) hours as needed for shortness of breath.), Disp: 75 mL, Rfl: 3   albuterol (VENTOLIN HFA) 108 (90 Base) MCG/ACT inhaler, INHALE 2 PUFFS BY MOUTH EVERY 6 HOURS AS NEEDED FOR SHORTNESS OF BREATH (Patient taking differently: Inhale 2 puffs into the lungs every 6 (six) hours as needed for shortness of breath.), Disp: 18 g, Rfl: 5   ascorbic acid (VITAMIN C) 500 MG tablet, Take 500 mg by mouth daily., Disp: , Rfl:    ATIVAN 0.5 MG tablet, Take 0.5 mg by mouth as needed., Disp: , Rfl:    cholecalciferol (VITAMIN D3) 25 MCG (1000 UNIT) tablet, Take 1,000 Units by mouth daily., Disp: , Rfl:    feeding supplement, ENSURE ENLIVE, (ENSURE  ENLIVE) LIQD, Take 237 mLs by mouth 2 (two) times daily between meals. (Patient taking differently: Take 237 mLs by mouth daily.), Disp: 237 mL, Rfl: 12   fexofenadine (ALLEGRA) 180 MG tablet, Take 180 mg by mouth daily., Disp: , Rfl:    Fluticasone-Umeclidin-Vilant (TRELEGY ELLIPTA) 100-62.5-25 MCG/INH AEPB, Inhale 1 puff into the lungs daily., Disp: 60 each, Rfl: 3   furosemide (LASIX) 20 MG tablet, Take 1 tablet (20 mg total) by mouth daily., Disp: 30 tablet, Rfl: 2   mirtazapine (REMERON) 7.5 MG tablet, Take 1 tablet (7.5 mg total) by mouth at bedtime. (Patient not taking: Reported on 03/23/2021), Disp: 30 tablet, Rfl: 2   oxyCODONE (OXY IR/ROXICODONE) 5 MG immediate release tablet, Take 5 mg by mouth as needed., Disp: , Rfl:    potassium chloride SA (KLOR-CON) 20 MEQ tablet, Take 1 tablet (20 mEq total) by mouth 2 (two) times daily., Disp: 60 tablet, Rfl: 0   spironolactone (ALDACTONE) 25 MG tablet, Take 2 tablets (50 mg total) by mouth daily., Disp: 60 tablet, Rfl: 2  I spent 30 minutes dedicated to the care of this patient on the date of this encounter to include pre-visit review of records,  face-to-face time with the patient discussing conditions above, post visit ordering of testing, clinical documentation with the electronic health record, making appropriate referrals as documented, and communicating necessary findings to members of the patients care team.    Garner Nash, DO [facility] 04/25/2021 7:27 AM

## 2021-04-28 ENCOUNTER — Other Ambulatory Visit: Payer: Self-pay | Admitting: Oncology

## 2021-04-28 NOTE — Telephone Encounter (Signed)
Received refill request for ondansetron. Will discuss w/MD. Last OV 12/24/20 and did not f/u afterwards

## 2021-05-12 LAB — ACID FAST CULTURE WITH REFLEXED SENSITIVITIES (MYCOBACTERIA): Acid Fast Culture: NEGATIVE

## 2021-05-25 ENCOUNTER — Encounter: Payer: Self-pay | Admitting: Oncology

## 2021-05-25 ENCOUNTER — Inpatient Hospital Stay (HOSPITAL_COMMUNITY)
Admission: EM | Admit: 2021-05-25 | Discharge: 2021-05-31 | DRG: 640 | Disposition: A | Discharge status: Hospice | Attending: Internal Medicine | Admitting: Internal Medicine

## 2021-05-25 ENCOUNTER — Other Ambulatory Visit: Payer: Self-pay

## 2021-05-25 ENCOUNTER — Encounter (HOSPITAL_COMMUNITY): Payer: Self-pay | Admitting: *Deleted

## 2021-05-25 ENCOUNTER — Emergency Department (HOSPITAL_COMMUNITY)

## 2021-05-25 DIAGNOSIS — G9341 Metabolic encephalopathy: Secondary | ICD-10-CM | POA: Diagnosis present

## 2021-05-25 DIAGNOSIS — F32A Depression, unspecified: Secondary | ICD-10-CM | POA: Diagnosis present

## 2021-05-25 DIAGNOSIS — E871 Hypo-osmolality and hyponatremia: Principal | ICD-10-CM | POA: Diagnosis present

## 2021-05-25 DIAGNOSIS — K766 Portal hypertension: Secondary | ICD-10-CM | POA: Diagnosis present

## 2021-05-25 DIAGNOSIS — Z8611 Personal history of tuberculosis: Secondary | ICD-10-CM

## 2021-05-25 DIAGNOSIS — Z515 Encounter for palliative care: Secondary | ICD-10-CM

## 2021-05-25 DIAGNOSIS — E872 Acidosis: Secondary | ICD-10-CM | POA: Diagnosis present

## 2021-05-25 DIAGNOSIS — F1020 Alcohol dependence, uncomplicated: Secondary | ICD-10-CM | POA: Diagnosis present

## 2021-05-25 DIAGNOSIS — Z720 Tobacco use: Secondary | ICD-10-CM

## 2021-05-25 DIAGNOSIS — Z79899 Other long term (current) drug therapy: Secondary | ICD-10-CM

## 2021-05-25 DIAGNOSIS — C3490 Malignant neoplasm of unspecified part of unspecified bronchus or lung: Secondary | ICD-10-CM | POA: Diagnosis present

## 2021-05-25 DIAGNOSIS — Z7951 Long term (current) use of inhaled steroids: Secondary | ICD-10-CM

## 2021-05-25 DIAGNOSIS — Z20822 Contact with and (suspected) exposure to covid-19: Secondary | ICD-10-CM | POA: Diagnosis present

## 2021-05-25 DIAGNOSIS — Z66 Do not resuscitate: Secondary | ICD-10-CM | POA: Diagnosis present

## 2021-05-25 DIAGNOSIS — J449 Chronic obstructive pulmonary disease, unspecified: Secondary | ICD-10-CM | POA: Diagnosis present

## 2021-05-25 DIAGNOSIS — R188 Other ascites: Secondary | ICD-10-CM

## 2021-05-25 DIAGNOSIS — K921 Melena: Secondary | ICD-10-CM | POA: Diagnosis present

## 2021-05-25 DIAGNOSIS — C259 Malignant neoplasm of pancreas, unspecified: Secondary | ICD-10-CM | POA: Diagnosis present

## 2021-05-25 DIAGNOSIS — E860 Dehydration: Secondary | ICD-10-CM | POA: Diagnosis present

## 2021-05-25 DIAGNOSIS — K7031 Alcoholic cirrhosis of liver with ascites: Secondary | ICD-10-CM | POA: Diagnosis present

## 2021-05-25 DIAGNOSIS — Z8249 Family history of ischemic heart disease and other diseases of the circulatory system: Secondary | ICD-10-CM

## 2021-05-25 DIAGNOSIS — I10 Essential (primary) hypertension: Secondary | ICD-10-CM | POA: Diagnosis present

## 2021-05-25 LAB — BLOOD GAS, VENOUS
Acid-base deficit: 4 mmol/L — ABNORMAL HIGH (ref 0.0–2.0)
Bicarbonate: 19.9 mmol/L — ABNORMAL LOW (ref 20.0–28.0)
FIO2: 21
O2 Saturation: 10.1 %
Patient temperature: 36.4
pCO2, Ven: 32.4 mmHg — ABNORMAL LOW (ref 44.0–60.0)
pH, Ven: 7.405 (ref 7.250–7.430)
pO2, Ven: <31 mmHg — CL (ref 32.0–45.0)

## 2021-05-25 LAB — COMPREHENSIVE METABOLIC PANEL
ALT: 42 U/L (ref 0–44)
AST: 98 U/L — ABNORMAL HIGH (ref 15–41)
Albumin: 2.1 g/dL — ABNORMAL LOW (ref 3.5–5.0)
Alkaline Phosphatase: 658 U/L — ABNORMAL HIGH (ref 38–126)
Anion gap: 13 (ref 5–15)
BUN: <5 mg/dL — ABNORMAL LOW (ref 8–23)
CO2: 20 mmol/L — ABNORMAL LOW (ref 22–32)
Calcium: 7.1 mg/dL — ABNORMAL LOW (ref 8.9–10.3)
Chloride: 68 mmol/L — ABNORMAL LOW (ref 98–111)
Creatinine, Ser: 0.38 mg/dL — ABNORMAL LOW (ref 0.61–1.24)
GFR, Estimated: >60 mL/min (ref >60)
Glucose, Bld: 78 mg/dL (ref 70–99)
Potassium: 4.3 mmol/L (ref 3.5–5.1)
Sodium: 101 mmol/L — CL (ref 135–145)
Total Bilirubin: 7.1 mg/dL — ABNORMAL HIGH (ref 0.3–1.2)
Total Protein: 6.4 g/dL — ABNORMAL LOW (ref 6.5–8.1)

## 2021-05-25 LAB — CBC
HCT: 29.1 % — ABNORMAL LOW (ref 39.0–52.0)
Hemoglobin: 10.8 g/dL — ABNORMAL LOW (ref 13.0–17.0)
MCH: 33.8 pg (ref 26.0–34.0)
MCHC: 37.1 g/dL — ABNORMAL HIGH (ref 30.0–36.0)
MCV: 90.9 fL (ref 80.0–100.0)
Platelets: 242 10*3/uL (ref 150–400)
RBC: 3.2 MIL/uL — ABNORMAL LOW (ref 4.22–5.81)
RDW: 16.7 % — ABNORMAL HIGH (ref 11.5–15.5)
WBC: 22.9 10*3/uL — ABNORMAL HIGH (ref 4.0–10.5)
nRBC: 0 % (ref 0.0–0.2)

## 2021-05-25 LAB — CBG MONITORING, ED: Glucose-Capillary: 81 mg/dL (ref 70–99)

## 2021-05-25 LAB — POC OCCULT BLOOD, ED: Fecal Occult Bld: NEGATIVE

## 2021-05-25 LAB — AMMONIA: Ammonia: 24 umol/L (ref 9–35)

## 2021-05-25 LAB — ETHANOL: Alcohol, Ethyl (B): 73 mg/dL — ABNORMAL HIGH (ref <10)

## 2021-05-25 MED ORDER — SODIUM CHLORIDE 0.9 % IV BOLUS
1000.0000 mL | Freq: Once | INTRAVENOUS | Status: AC
  Administered 2021-05-26: 1000 mL via INTRAVENOUS

## 2021-05-25 NOTE — ED Provider Notes (Addendum)
[facility] EMERGENCY DEPARTMENT Provider Note   CSN: [id number] Arrival date & time: 05/25/21  2201     History Chief Complaint  Patient presents with   Altered Mental Status    Ronald [name] is a 65 y.o. male.  Patient is a 65 year old male with history of COPD, hypertension, alcoholism, lung cancer, pancreatic cancer, liver cirrhosis.  Patient brought by EMS for evaluation of altered mental status.  According to the patient's sister, he has been not quite himself for the past 2 days.  He has been confused, disoriented, and "talking out of his head".  Patient denies he is having any specific aches or pains.  I am told that he had a liquid bowel movement that was black in color, but patient denies abdominal pain.  Patient does have a history of alcoholism and sister is concerned he may have been drinking the past few days.  Apparently he has a brother who recently passed away and has been somewhat upset about this.  The sister tells me he has undergone treatment for his lung cancer, however has elected to forego further treatment.  I am told he is on palliative care.  The history is provided by the patient.  Altered Mental Status Presenting symptoms: confusion and disorientation   Severity:  Moderate Most recent episode:  2 days ago Timing:  Constant Progression:  Worsening Chronicity:  New     Past Medical History:  Diagnosis Date   Alcoholism ([place])    Anemia    iron deficiency   Cancer ([name])    lung Stage I- 10/11/20   Collapse of left lung    lower lobe   COPD (chronic obstructive pulmonary disease) (HCC)    Depression    Dyspnea    Exertion   Family history of adverse reaction to anesthesia    sister had a bad headache   Hypertension    10/11/20 - not in years   Mediastinal adenopathy     and endobronchial lesion   Pneumonia    TB (pulmonary tuberculosis)    Wears dentures     Patient Active Problem List   Diagnosis Date Noted   Pancreatic mass 04/25/2021    Ascites 02/14/2021   Hepatic cirrhosis ([place]) 01/12/2021   Symptomatic anemia 01/05/2021   GI bleed 01/05/2021   Syncope 01/05/2021   Goals of care, counseling/discussion 11/03/2020   S/P bronchoscopy 10/14/2020   Endobronchial cancer, left ([place])    Squamous cell carcinoma lung, 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   Lung cancer ([place]) 11/05/2018   Mediastinal adenopathy    Thrush, oral 09/27/2018   Protein-calorie malnutrition, severe 09/23/2018   Abnormal ECG 09/20/2018   CAD (coronary artery disease) 09/20/2018   Obstructive pneumonia 09/19/2018   Abnormal LFTs 09/19/2018   Hyponatremia 09/19/2018   Stage 3 severe COPD by GOLD classification ([place]) 09/19/2018   Hypertension 09/19/2018   Alcoholism ([place]) 09/19/2018   Anemia 09/19/2018   Lung mass 09/19/2018    Past Surgical History:  Procedure Laterality Date   BIOPSY  01/06/2021   Procedure: BIOPSY;  Surgeon: Milus Banister, MD;  Location: [facility];  Service: Endoscopy;;   COLONOSCOPY WITH PROPOFOL N/A 01/27/2021    non-bleeding internal hemorrhoids, four sessile polyp 5-8 mm in size, tubular adenomas   ESOPHAGOGASTRODUODENOSCOPY (EGD) WITH PROPOFOL N/A 10/28/2020   Procedure: ESOPHAGOGASTRODUODENOSCOPY (EGD) WITH PROPOFOL;  Surgeon: Milus Banister, MD;  Location: [facility];  Service: Endoscopy;  Laterality: N/A;   ESOPHAGOGASTRODUODENOSCOPY (EGD) WITH PROPOFOL N/A 01/06/2021  portal gastropathy, no varices. Duodenal mucosa proximal to major papilla orifice friable and edematous appearing. Lymphangiectasia.    EUS N/A 10/28/2020   Procedure: UPPER ENDOSCOPIC ULTRASOUND (EUS) RADIAL;  Surgeon: Milus Banister, MD;  Location: [facility];  Service: Endoscopy;  Laterality: N/A;   FINE NEEDLE ASPIRATION N/A 10/28/2020   Procedure: FINE NEEDLE ASPIRATION (FNA) LINEAR;  Surgeon: Milus Banister, MD;  Location: [facility];  Service: Endoscopy;  Laterality: N/A;   MULTIPLE TOOTH EXTRACTIONS     POLYPECTOMY  01/27/2021   Procedure:  POLYPECTOMY INTESTINAL;  Surgeon: Eloise Harman, DO;  [facility];  Service: Endoscopy;;   VIDEO BRONCHOSCOPY N/A 10/12/2020   Procedure: VIDEO BRONCHOSCOPY WITH CRYOTHERAPY AND BALLOON DILATATION;  Surgeon: Garner Nash, DO;  Location: [place];  Service: Pulmonary;  Laterality: N/A;   VIDEO BRONCHOSCOPY WITH ENDOBRONCHIAL ULTRASOUND N/A 10/02/2018   Procedure: VIDEO BRONCHOSCOPY WITH ENDOBRONCHIAL ULTRASOUND;  Surgeon: Garner Nash, DO;  Location: [place];  Service: Thoracic;  Laterality: N/A;   VIDEO BRONCHOSCOPY WITH ENDOBRONCHIAL ULTRASOUND N/A 10/12/2020   Procedure: VIDEO BRONCHOSCOPY WITH ENDOBRONCHIAL ULTRASOUND;  Surgeon: Garner Nash, DO;  Location: [place];  Service: Pulmonary;  Laterality: N/A;       Family History  Problem Relation Age of Onset   Heart disease Mother    Heart disease Father    Diabetes Other    Hypertension Other    Ovarian cancer Maternal Grandmother    Liver disease Neg Hx    Colon cancer Neg Hx     Social History   Tobacco Use   Smoking status: Former    Packs/day: 2.00    Types: Cigarettes    Quit date: 09/19/2018    Years since quitting: 2.6   Smokeless tobacco: Current    Types: Snuff  Vaping Use   Vaping Use: Never used  Substance Use Topics   Alcohol use: Yes    Alcohol/week: 4.0 standard drinks    Types: 4 Cans of beer per week    Comment: occas   Drug use: No    Home Medications Prior to Admission medications   Medication Sig Start Date End Date Taking? Authorizing Provider  albuterol (PROVENTIL) (2.5 MG/3ML) 0.083% nebulizer solution TAKE 3ML BY MOUTH VIA NEBULIZER EVERY 4 HOURS AS NEEDED FOR WHEEZING OR SHORTNESS OF BREATH Patient taking differently: Take 2.5 mg by nebulization every 4 (four) hours as needed for shortness of breath. 09/15/20   Icard, Octavio Graves, DO  albuterol (VENTOLIN HFA) 108 (90 Base) MCG/ACT inhaler INHALE 2 PUFFS BY MOUTH EVERY 6 HOURS AS NEEDED FOR SHORTNESS OF BREATH Patient taking  differently: Inhale 2 puffs into the lungs every 6 (six) hours as needed for shortness of breath. 12/20/20   Icard, Leory Plowman L, DO  ascorbic acid (VITAMIN C) 500 MG tablet Take 500 mg by mouth daily.    [provider]  ATIVAN 0.5 MG tablet Take 0.5 mg by mouth as needed. 03/02/21   [provider]  cholecalciferol (VITAMIN D3) 25 MCG (1000 UNIT) tablet Take 1,000 Units by mouth daily.    [provider]  feeding supplement, ENSURE ENLIVE, (ENSURE ENLIVE) LIQD Take 237 mLs by mouth 2 (two) times daily between meals. Patient taking differently: Take 237 mLs by mouth daily. 09/24/18   Florencia Reasons, MD  fexofenadine (ALLEGRA) 180 MG tablet Take 180 mg by mouth daily.    [provider]  Fluticasone-Umeclidin-Vilant (TRELEGY ELLIPTA) 100-62.5-25 MCG/INH AEPB Inhale 1 puff into the lungs  daily. 12/20/20   Icard, Octavio Graves, DO  furosemide (LASIX) 20 MG tablet Take 1 tablet (20 mg total) by mouth daily. 02/17/21 03/19/21  Manuella Ghazi, Pratik D, DO  mirtazapine (REMERON) 7.5 MG tablet Take 1 tablet (7.5 mg total) by mouth at bedtime. Patient not taking: Reported on 03/23/2021 02/16/21   Heath Lark D, DO  oxyCODONE (OXY IR/ROXICODONE) 5 MG immediate release tablet Take 5 mg by mouth as needed. 03/02/21   [provider]  potassium chloride SA (KLOR-CON) 20 MEQ tablet Take 1 tablet (20 mEq total) by mouth 2 (two) times daily. 01/07/21   Little Ishikawa, MD  spironolactone (ALDACTONE) 25 MG tablet Take 2 tablets (50 mg total) by mouth daily. 02/17/21 03/19/21  Heath Lark D, DO    Allergies    Patient has no known allergies.  Review of Systems   Review of Systems  Psychiatric/Behavioral:  Positive for confusion.   All other systems reviewed and are negative.  Physical Exam Updated Vital Signs BP 104/70   Pulse (!) 109   Temp (!) 97.5 F (36.4 C) (Axillary)   Resp (!) 24   Ht 5\' 4"  (1.626 m)   Wt 60.8 kg   SpO2 100%   BMI 23.00 kg/m   Physical Exam Vitals and  nursing note reviewed.  Constitutional:      General: He is not in acute distress.    Appearance: He is well-developed. He is not diaphoretic.     Comments: Patient is awake and alert.  He seems to have some difficulty with processing responses to questions and responding to commands.  He is thin, pale, and chronically ill-appearing.  HENT:     Head: Normocephalic and atraumatic.  Eyes:     General: Scleral icterus present.     Pupils: Pupils are equal, round, and reactive to light.  Cardiovascular:     Rate and Rhythm: Normal rate and regular rhythm.     Heart sounds: No murmur heard.   No friction rub.  Pulmonary:     Effort: Pulmonary effort is normal. No respiratory distress.     Breath sounds: Normal breath sounds. No wheezing or rales.  Abdominal:     General: Bowel sounds are normal. There is no distension.     Palpations: Abdomen is soft.     Tenderness: There is no abdominal tenderness.  Musculoskeletal:        General: Normal range of motion.     Cervical back: Normal range of motion and neck supple.     Right lower leg: No edema.     Left lower leg: No edema.  Skin:    General: Skin is warm and dry.  Neurological:     Mental Status: He is oriented to person, place, and time.     Coordination: Coordination normal.    ED Results / Procedures / Treatments   Labs (all labs ordered are listed, but only abnormal results are displayed) Labs Reviewed  BLOOD GAS, VENOUS - Abnormal; Notable for the following components:      Result Value   pCO2, Ven 32.4 (*)    pO2, Ven <31.0 (*)    Bicarbonate 19.9 (*)    Acid-base deficit 4.0 (*)    All other components within normal limits  RESP PANEL BY RT-PCR (FLU A&B, COVID) ARPGX2  COMPREHENSIVE METABOLIC PANEL  CBC  ETHANOL  URINALYSIS, ROUTINE W REFLEX MICROSCOPIC  RAPID URINE DRUG SCREEN, HOSP PERFORMED  AMMONIA  CBG MONITORING, ED  POC OCCULT  BLOOD, ED    EKG EKG Interpretation  Date/Time:  Wednesday May 25 2021  23:23:09 EDT Ventricular Rate:  109 PR Interval:  194 QRS Duration: 109 QT Interval:  340 QTC Calculation: 458 R Axis:   96 Text Interpretation: Sinus tachycardia Right axis deviation Low voltage, extremity and precordial leads Confirmed by Veryl Speak [phone]) on 05/25/2021 11:37:11 PM  Radiology CT Head Wo Contrast  Result Date: 05/25/2021 CLINICAL DATA:  Altered mental status. EXAM: CT HEAD WITHOUT CONTRAST TECHNIQUE: Contiguous axial images were obtained from the base of the skull through the vertex without intravenous contrast. COMPARISON:  January 05, 2021 FINDINGS: Brain: There is moderate severity cerebral atrophy with widening of the extra-axial spaces and ventricular dilatation. There are areas of decreased attenuation within the white matter tracts of the supratentorial brain, consistent with microvascular disease changes. Vascular: No hyperdense vessel or unexpected calcification. Skull: Normal. Negative for fracture or focal lesion. Sinuses/Orbits: No acute finding. Other: It should be noted study is there is difficult and subsequently limited secondary to extensive amount of patient motion. IMPRESSION: 1. Markedly limited study secondary to patient motion. 2. No acute intracranial abnormality. 3. Generalized cerebral atrophy. Electronically Signed   By: Virgina Norfolk M.D.   On: 05/25/2021 23:08    Procedures Procedures   Medications Ordered in ED Medications - No data to display  ED Course  I have reviewed the triage vital signs and the nursing notes.  Pertinent labs & imaging results that were available during my care of the patient were reviewed by me and considered in my medical decision making (see chart for details).    MDM Rules/Calculators/A&P  Patient brought by EMS for evaluation of increased confusion and "talking out of his head" per his sister.  This has worsened over the past several days.  He has extensive past medical history as described in the  HPI.  Work-up initiated including basic laboratory studies, head CT.  Patient found to have sodium of 101, white count of 23,000 but is afebrile, and elevated lactate of 4.3.  Blood cultures were obtained based on the leukocytosis and elevated lactate and broad-spectrum antibiotics administered.  1 L of normal saline was administered.  Care then discussed with Dr. Josephine Cables who does not feel as though the patient is appropriate for any pain.  He feels as though the patient will require hypertonic saline and this needs to be performed in the intensive care setting.  I have discussed the patient with Dr. Lamonte Sakai from critical care who agrees to accept the patient in transfer.  Per Dr. Agustina Caroli recommendations, I spoke with the pharmacist who recommended starting the hypertonic saline slowly at a rate of 30 cc/h.  This was initiated and patient will be transferred to [facility] for further treatment.  CRITICAL CARE Performed by: Veryl Speak Total critical care time: 45 minutes Critical care time was exclusive of separately billable procedures and treating other patients. Critical care was necessary to treat or prevent imminent or life-threatening deterioration. Critical care was time spent personally by me on the following activities: development of treatment plan with patient and/or surrogate as well as nursing, discussions with consultants, evaluation of patient's response to treatment, examination of patient, obtaining history from patient or surrogate, ordering and performing treatments and interventions, ordering and review of laboratory studies, ordering and review of radiographic studies, pulse oximetry and re-evaluation of patient's condition.   Final Clinical Impression(s) / ED Diagnoses Final diagnoses:  None    Rx / DC Orders  ED Discharge Orders     None        Veryl Speak, MD 05/26/21 [id number]    Veryl Speak, MD 05/26/21 [id number]

## 2021-05-25 NOTE — ED Notes (Signed)
Critical PO2 less than 31 reported to EDP [place] at this time. EDP in room .

## 2021-05-25 NOTE — ED Notes (Signed)
Family in room  

## 2021-05-25 NOTE — ED Triage Notes (Signed)
Sister states pt is a cancer pt and has been acting more confused than usual; pt has been having dark tarry diarrhea today

## 2021-05-25 NOTE — ED Notes (Signed)
Lab in room.

## 2021-05-25 NOTE — ED Notes (Signed)
Pt pulling on wires.

## 2021-05-25 NOTE — ED Provider Notes (Signed)
Emergency Medicine Provider Triage Evaluation Note  Ronald [name] , a 65 y.o. male  was evaluated in triage.  Pt complains of confusion for 2 days.  Review of Systems  Positive: Confusion, tarry stool, abdomen swelling Negative: No fever, no cough, no weakness  Physical Exam  BP 117/80 (BP Location: Left Arm)   Pulse (!) 114   Temp (!) 97.5 F (36.4 C) (Axillary)   Resp 18   Ht 5\' 4"  (1.626 m)   Wt 60.8 kg   SpO2 100%   BMI 23.00 kg/m  Gen:   Awake, confused, unable to give history Resp:  Normal effort  MSK:   Moves extremities without difficulty  Other:  Moderate abdomen swelling, consistent with ascites  Medical Decision Making  Medically screening exam initiated at 10:31 PM.  Appropriate orders placed.  Ronald [name] was informed that the remainder of the evaluation will be completed by another provider, this initial triage assessment does not replace that evaluation, and the importance of remaining in the ED until their evaluation is complete.     Ronald Bo, MD 05/25/21 2232

## 2021-05-25 NOTE — ED Notes (Signed)
EDP in room  

## 2021-05-25 NOTE — ED Notes (Signed)
Sodium of 101 reported to edp [place] at this time--- see orders

## 2021-05-25 NOTE — ED Notes (Signed)
CT here for pt

## 2021-05-26 DIAGNOSIS — Z515 Encounter for palliative care: Secondary | ICD-10-CM | POA: Diagnosis not present

## 2021-05-26 DIAGNOSIS — Z20822 Contact with and (suspected) exposure to covid-19: Secondary | ICD-10-CM | POA: Diagnosis present

## 2021-05-26 DIAGNOSIS — I10 Essential (primary) hypertension: Secondary | ICD-10-CM | POA: Diagnosis present

## 2021-05-26 DIAGNOSIS — C3490 Malignant neoplasm of unspecified part of unspecified bronchus or lung: Secondary | ICD-10-CM | POA: Diagnosis present

## 2021-05-26 DIAGNOSIS — Z79899 Other long term (current) drug therapy: Secondary | ICD-10-CM | POA: Diagnosis not present

## 2021-05-26 DIAGNOSIS — E872 Acidosis: Secondary | ICD-10-CM | POA: Diagnosis present

## 2021-05-26 DIAGNOSIS — F1020 Alcohol dependence, uncomplicated: Secondary | ICD-10-CM | POA: Diagnosis present

## 2021-05-26 DIAGNOSIS — C799 Secondary malignant neoplasm of unspecified site: Secondary | ICD-10-CM | POA: Diagnosis not present

## 2021-05-26 DIAGNOSIS — Z7189 Other specified counseling: Secondary | ICD-10-CM | POA: Diagnosis not present

## 2021-05-26 DIAGNOSIS — K7030 Alcoholic cirrhosis of liver without ascites: Secondary | ICD-10-CM | POA: Diagnosis not present

## 2021-05-26 DIAGNOSIS — C259 Malignant neoplasm of pancreas, unspecified: Secondary | ICD-10-CM | POA: Diagnosis present

## 2021-05-26 DIAGNOSIS — K8689 Other specified diseases of pancreas: Secondary | ICD-10-CM | POA: Diagnosis not present

## 2021-05-26 DIAGNOSIS — K7031 Alcoholic cirrhosis of liver with ascites: Secondary | ICD-10-CM

## 2021-05-26 DIAGNOSIS — K766 Portal hypertension: Secondary | ICD-10-CM | POA: Diagnosis present

## 2021-05-26 DIAGNOSIS — Z7951 Long term (current) use of inhaled steroids: Secondary | ICD-10-CM | POA: Diagnosis not present

## 2021-05-26 DIAGNOSIS — Z8611 Personal history of tuberculosis: Secondary | ICD-10-CM | POA: Diagnosis not present

## 2021-05-26 DIAGNOSIS — E871 Hypo-osmolality and hyponatremia: Secondary | ICD-10-CM | POA: Diagnosis not present

## 2021-05-26 DIAGNOSIS — E860 Dehydration: Secondary | ICD-10-CM | POA: Diagnosis present

## 2021-05-26 DIAGNOSIS — G9340 Encephalopathy, unspecified: Secondary | ICD-10-CM | POA: Diagnosis not present

## 2021-05-26 DIAGNOSIS — K921 Melena: Secondary | ICD-10-CM | POA: Diagnosis present

## 2021-05-26 DIAGNOSIS — Z720 Tobacco use: Secondary | ICD-10-CM | POA: Diagnosis not present

## 2021-05-26 DIAGNOSIS — Z8249 Family history of ischemic heart disease and other diseases of the circulatory system: Secondary | ICD-10-CM | POA: Diagnosis not present

## 2021-05-26 DIAGNOSIS — F32A Depression, unspecified: Secondary | ICD-10-CM | POA: Diagnosis present

## 2021-05-26 DIAGNOSIS — G9341 Metabolic encephalopathy: Secondary | ICD-10-CM | POA: Diagnosis present

## 2021-05-26 DIAGNOSIS — Z66 Do not resuscitate: Secondary | ICD-10-CM | POA: Diagnosis present

## 2021-05-26 DIAGNOSIS — J449 Chronic obstructive pulmonary disease, unspecified: Secondary | ICD-10-CM | POA: Diagnosis present

## 2021-05-26 LAB — URINALYSIS, ROUTINE W REFLEX MICROSCOPIC
Glucose, UA: 100 mg/dL — AB
Hgb urine dipstick: NEGATIVE
Ketones, ur: 15 mg/dL — AB
Leukocytes,Ua: NEGATIVE
Nitrite: NEGATIVE
Protein, ur: NEGATIVE mg/dL
Specific Gravity, Urine: 1.02 (ref 1.005–1.030)
pH: 5.5 (ref 5.0–8.0)

## 2021-05-26 LAB — BASIC METABOLIC PANEL
Anion gap: 13 (ref 5–15)
Anion gap: 10 (ref 5–15)
BUN: <5 mg/dL — ABNORMAL LOW (ref 8–23)
BUN: <5 mg/dL — ABNORMAL LOW (ref 8–23)
CO2: 22 mmol/L (ref 22–32)
CO2: 23 mmol/L (ref 22–32)
Calcium: 7.4 mg/dL — ABNORMAL LOW (ref 8.9–10.3)
Calcium: 7.3 mg/dL — ABNORMAL LOW (ref 8.9–10.3)
Chloride: 67 mmol/L — ABNORMAL LOW (ref 98–111)
Chloride: 77 mmol/L — ABNORMAL LOW (ref 98–111)
Creatinine, Ser: 0.49 mg/dL — ABNORMAL LOW (ref 0.61–1.24)
Creatinine, Ser: 0.56 mg/dL — ABNORMAL LOW (ref 0.61–1.24)
GFR, Estimated: >60 mL/min (ref >60)
GFR, Estimated: >60 mL/min (ref >60)
Glucose, Bld: 71 mg/dL (ref 70–99)
Glucose, Bld: 76 mg/dL (ref 70–99)
Potassium: 4.3 mmol/L (ref 3.5–5.1)
Potassium: 3.9 mmol/L (ref 3.5–5.1)
Sodium: 102 mmol/L — CL (ref 135–145)
Sodium: 110 mmol/L — CL (ref 135–145)

## 2021-05-26 LAB — RAPID URINE DRUG SCREEN, HOSP PERFORMED
Amphetamines: NOT DETECTED
Barbiturates: NOT DETECTED
Benzodiazepines: POSITIVE — AB
Cocaine: NOT DETECTED
Opiates: NOT DETECTED
Tetrahydrocannabinol: NOT DETECTED

## 2021-05-26 LAB — SODIUM
Sodium: 102 mmol/L — CL (ref 135–145)
Sodium: 105 mmol/L — CL (ref 135–145)
Sodium: 109 mmol/L — CL (ref 135–145)
Sodium: 111 mmol/L — CL (ref 135–145)

## 2021-05-26 LAB — MRSA NEXT GEN BY PCR, NASAL: MRSA by PCR Next Gen: NOT DETECTED

## 2021-05-26 LAB — GLUCOSE, CAPILLARY
Glucose-Capillary: 75 mg/dL (ref 70–99)
Glucose-Capillary: 88 mg/dL (ref 70–99)

## 2021-05-26 LAB — LACTIC ACID, PLASMA
Lactic Acid, Venous: 4.3 mmol/L (ref 0.5–1.9)
Lactic Acid, Venous: 3.1 mmol/L (ref 0.5–1.9)

## 2021-05-26 LAB — OSMOLALITY: Osmolality: 231 mOsm/kg — CL (ref 275–295)

## 2021-05-26 LAB — RESP PANEL BY RT-PCR (FLU A&B, COVID) ARPGX2
Influenza A by PCR: NEGATIVE
Influenza B by PCR: NEGATIVE
SARS Coronavirus 2 by RT PCR: NEGATIVE

## 2021-05-26 LAB — SODIUM, URINE, RANDOM: Sodium, Ur: <10 mmol/L

## 2021-05-26 LAB — OSMOLALITY, URINE: Osmolality, Ur: 388 mOsm/kg (ref 300–900)

## 2021-05-26 MED ORDER — UMECLIDINIUM BROMIDE 62.5 MCG/INH IN AEPB
1.0000 | INHALATION_SPRAY | Freq: Every day | RESPIRATORY_TRACT | Status: DC
  Administered 2021-05-26 – 2021-05-31 (×6): 1 via RESPIRATORY_TRACT
  Filled 2021-05-26: qty 7

## 2021-05-26 MED ORDER — ONDANSETRON HCL 4 MG/2ML IJ SOLN
4.0000 mg | Freq: Once | INTRAMUSCULAR | Status: AC
  Administered 2021-05-26: 4 mg via INTRAVENOUS
  Filled 2021-05-26: qty 2

## 2021-05-26 MED ORDER — CHLORHEXIDINE GLUCONATE CLOTH 2 % EX PADS
6.0000 | MEDICATED_PAD | Freq: Every day | CUTANEOUS | Status: DC
  Administered 2021-05-26: 6 via TOPICAL

## 2021-05-26 MED ORDER — SODIUM CHLORIDE 0.9 % IV SOLN: INTRAVENOUS | Status: DC

## 2021-05-26 MED ORDER — LORAZEPAM 2 MG/ML IJ SOLN
1.0000 mg | Freq: Once | INTRAMUSCULAR | Status: AC
  Administered 2021-05-26: 1 mg via INTRAVENOUS
  Filled 2021-05-26: qty 1

## 2021-05-26 MED ORDER — PIPERACILLIN-TAZOBACTAM 3.375 G IVPB
3.3750 g | Freq: Once | INTRAVENOUS | Status: AC
  Administered 2021-05-26: 3.375 g via INTRAVENOUS
  Filled 2021-05-26: qty 50

## 2021-05-26 MED ORDER — VANCOMYCIN HCL IN DEXTROSE 1-5 GM/200ML-% IV SOLN
1000.0000 mg | Freq: Once | INTRAVENOUS | Status: AC
  Administered 2021-05-26: 1000 mg via INTRAVENOUS
  Filled 2021-05-26: qty 200

## 2021-05-26 MED ORDER — PANTOPRAZOLE SODIUM 40 MG PO TBEC: 40.0000 mg | DELAYED_RELEASE_TABLET | Freq: Two times a day (BID) | ORAL | Status: DC

## 2021-05-26 MED ORDER — ENSURE ENLIVE PO LIQD
237.0000 mL | ORAL | Status: DC
  Administered 2021-05-27 – 2021-05-31 (×4): 237 mL via ORAL

## 2021-05-26 MED ORDER — HEPARIN SODIUM (PORCINE) 5000 UNIT/ML IJ SOLN: 5000.0000 [IU] | Freq: Three times a day (TID) | INTRAMUSCULAR | Status: DC

## 2021-05-26 MED ORDER — FLUTICASONE-UMECLIDIN-VILANT 100-62.5-25 MCG/INH IN AEPB: 1.0000 | INHALATION_SPRAY | Freq: Every day | RESPIRATORY_TRACT | Status: DC

## 2021-05-26 MED ORDER — FLUTICASONE FUROATE-VILANTEROL 100-25 MCG/INH IN AEPB
1.0000 | INHALATION_SPRAY | Freq: Every day | RESPIRATORY_TRACT | Status: DC
  Administered 2021-05-26 – 2021-05-31 (×6): 1 via RESPIRATORY_TRACT
  Filled 2021-05-26: qty 28

## 2021-05-26 MED ORDER — PANTOPRAZOLE SODIUM 40 MG IV SOLR
40.0000 mg | Freq: Every day | INTRAVENOUS | Status: DC
  Administered 2021-05-26 – 2021-05-30 (×5): 40 mg via INTRAVENOUS
  Filled 2021-05-26 (×5): qty 40

## 2021-05-26 MED ORDER — SODIUM CHLORIDE 3 % IV SOLN
INTRAVENOUS | Status: DC
  Filled 2021-05-26 (×3): qty 500

## 2021-05-26 MED ORDER — SODIUM CHLORIDE 0.9 % IV SOLN: INTRAVENOUS | Status: DC | PRN

## 2021-05-26 MED ORDER — ALBUTEROL SULFATE (2.5 MG/3ML) 0.083% IN NEBU: 2.5000 mg | INHALATION_SOLUTION | RESPIRATORY_TRACT | Status: DC | PRN

## 2021-05-26 MED ORDER — CHLORHEXIDINE GLUCONATE CLOTH 2 % EX PADS
6.0000 | MEDICATED_PAD | Freq: Every day | CUTANEOUS | Status: DC
  Administered 2021-05-27 – 2021-05-31 (×3): 6 via TOPICAL

## 2021-05-26 NOTE — ED Notes (Signed)
Critical lactic acid of 4.3 reported to edp [place] at this time. See orders

## 2021-05-26 NOTE — H&P (Signed)
NAME:  Ronald [name], MRN:  [id number], DOB:  05-21-1956, LOS: 0 ADMISSION DATE:  05/25/2021 CONSULTATION DATE:  05/26/2021 REFERRING MD:  Stark Jock - [facility] ED CHIEF COMPLAINT:  Hyponatremia   History of Present Illness:  65 year old man with PMHx significant for depression, HTN, COPD, decompensated EtOH cirrhosis (c/b ascites requiring paracentesis), lung CA and pancreatic CA who presented to [facility] 7/27 via EMS for disorientation, confusion. Found to be hyponatremic to Na 101 with concern for neurologic manifestations in the setting of mental status change.  Per chart review, patient's sister reported mental status changes from baseline including confusion and "talking out of his head". Additionally, concern for increased EtOH consumption over the last several days due to brother's recent passing.   PCCM consulted for admission to ICU and management of hyponatremia, including hypertonic saline 3%.  Pertinent Medical History:   Past Medical History:  Diagnosis Date   Alcoholism ([place])    Anemia    iron deficiency   Cancer ([place])    lung Stage I- 10/11/20   Collapse of left lung    lower lobe   COPD (chronic obstructive pulmonary disease) (HCC)    Depression    Dyspnea    Exertion   Family history of adverse reaction to anesthesia    sister had a bad headache   Hypertension    10/11/20 - not in years   Mediastinal adenopathy     and endobronchial lesion   Pneumonia    TB (pulmonary tuberculosis)    Wears dentures    Significant Hospital Events: Including procedures, antibiotic start and stop dates in addition to other pertinent events   7/27 - Presented to [facility] via EMS for AMS, confusion x 2 days; concern for active drinking in the setting of brother's recent passing. Na noted to be 101. Transferred to [facility] for ICU admission and hypertonic 3% saline administration.  Interim History / Subjective:  Transferred from [place] for ICU admission, Na management  Objective:  Blood pressure 99/69,  pulse 99, temperature 98 F (36.7 C), temperature source Axillary, resp. rate 19, height 5\' 4"  (1.626 m), weight 60.8 kg, SpO2 100 %.       No intake or output data in the 24 hours ending 05/26/21 0451 Filed Weights   05/25/21 2214  Weight: 60.8 kg   Physical Examination: General: Chronically ill-appearing middle-aged man, drowsy, in NAD. HEENT: [place]/AT, mildly sclera, PERRL, dry mucous membranes. Neuro: Lethargic. Responds to tactile stimuli. and Responds to noxious stimuli. Following commands intermittently. Moves all 4 extremities spontaneously. CV: Tachycardic, regular rhythm, no m/g/r. PULM: Breathing even and unlabored on RA. Lung fields CTAB anteriorly. GI: Soft, nontender, moderately distended. Dull to percussion with +fluid wave. Hypoactive bowel sounds. Extremities: No LE edema noted. Skin: Warm/dry, spider angiomas noted to chest.  Resolved Hospital Problem List:    Assessment & Plan:  Severe hyponatremia, secondary to cirrhosis vs. ?potomania - Admit to ICU as transfer from [place] hypertonic saline 3%, rate 34mL/hr max via PIV - Goal Na correction no more than 41mEq over 2 hours and no more than 39mEq over 4 hours - Na Q4H for now - Frequent neuro checks - Monitor for signs of neurologic compromise  Acute encephalopathy, likely multifactorial in the setting of hyponatremia and hepatic dysfunction CT Head completed 7/27 NAICA, showing generalized cerebral atrophy. - Correct Na as above - Monitor electrolytes, optimize as able - Frequent neuro checks - Monitor airway closely, low threshold for intubation if unable to protect airway -  Repeat head imaging not indicated at this juncture  Lactic acidosis Receved Zosyn/Vanc x 1 dose in [facility] ED. - Trend LA - F/u culture data  Decompensated EtOH cirrhosis Ascites EtOH abuse History of EtOH abuse with resultant liver dysfunction, progressing to decompensated EtOH cirrhosis. C/b portal HTN as evidenced by ascites,  requiring paracentesis (last 03/24/21 with 4L removed) in addition to diuretic therapy. Ethanol +73 on admission. - HOLD diuretics at present, given significant hypoNa - Consider CIWA protocol if increased agitation - Encourage cessation  COPD History of COPD, no baseline O2 requirement. Home regimen includes Trelegy Ellipta and albuterol. - Continue bronchodilators - Supplemental O2 as needed for goal sat 88-92%  Possible melena Per sister's report, patient had black tarry stool day prior to admission. Query if this is related to known PHG as a result of his cirrhosis. - PPI BID - Low threshold for GI consult if continued dark stools  Hypertension Not on any antihypertensives at home. - Goal MAP > 65 - Monitor BP closely  NSCLC, squamous cell lung cancer Pancreatic cancer History of lung and pancreatic cancer; per chart review, has chosen to forego further treatment. - Palliative care following in the past - Reconsult Palliative this admission for ongoing [place] discussion  Best Practice: (right click and "Reselect all SmartList Selections" daily)   Diet/type: Regular consistency (see orders) DVT prophylaxis: prophylactic heparin  GI prophylaxis: PPI Lines: N/A Foley:  N/A Code Status:  full code Last date of multidisciplinary goals of care discussion [Pending]  Labs:  CBC: Recent Labs  Lab 05/25/21 2241  WBC 22.9*  HGB 10.8*  HCT 29.1*  MCV 90.9  PLT 122   Basic Metabolic Panel: Recent Labs  Lab 05/25/21 2241 05/26/21 0008  NA 101* 102*  K 4.3 4.3  CL 68* 67*  CO2 20* 22  GLUCOSE 78 71  BUN <5* <5*  CREATININE 0.38* 0.49*  CALCIUM 7.1* 7.4*   GFR: Estimated Creatinine Clearance: 76.1 mL/min (A) (by C-G formula based on SCr of 0.49 mg/dL (L)). Recent Labs  Lab 05/25/21 2241 05/26/21 0003 05/26/21 0217  WBC 22.9*  --   --   LATICACIDVEN  --  4.3* 3.1*   Liver Function Tests: Recent Labs  Lab 05/25/21 2241  AST 98*  ALT 42  ALKPHOS 658*   BILITOT 7.1*  PROT 6.4*  ALBUMIN 2.1*   No results for input(s): LIPASE, AMYLASE in the last 168 hours. Recent Labs  Lab 05/25/21 2314  AMMONIA 24   ABG:    Component Value Date/Time   HCO3 19.9 (L) 05/25/2021 2241   ACIDBASEDEF 4.0 (H) 05/25/2021 2241   O2SAT 10.1 05/25/2021 2241    Coagulation Profile: No results for input(s): INR, PROTIME in the last 168 hours.  Cardiac Enzymes: No results for input(s): CKTOTAL, CKMB, CKMBINDEX, TROPONINI in the last 168 hours.  HbA1C: No results found for: HGBA1C  CBG: Recent Labs  Lab 05/25/21 2321  GLUCAP 81   Review of Systems:   Review of systems completed with pertinent positives/negatives outlined in above HPI.  Past Medical History:  He,  has a past medical history of Alcoholism ([place]), Anemia, Cancer ([place]), Collapse of left lung, COPD (chronic obstructive pulmonary disease) ([place]), Depression, Dyspnea, Family history of adverse reaction to anesthesia, Hypertension, Mediastinal adenopathy, Pneumonia, TB (pulmonary tuberculosis), and Wears dentures.   Surgical History:   Past Surgical History:  Procedure Laterality Date   BIOPSY  01/06/2021   Procedure: BIOPSY;  Surgeon: Milus Banister, MD;  Location:  [facility];  Service: Endoscopy;;   COLONOSCOPY WITH PROPOFOL N/A 01/27/2021    non-bleeding internal hemorrhoids, four sessile polyp 5-8 mm in size, tubular adenomas   ESOPHAGOGASTRODUODENOSCOPY (EGD) WITH PROPOFOL N/A 10/28/2020   Procedure: ESOPHAGOGASTRODUODENOSCOPY (EGD) WITH PROPOFOL;  Surgeon: Milus Banister, MD;  Location: [facility];  Service: Endoscopy;  Laterality: N/A;   ESOPHAGOGASTRODUODENOSCOPY (EGD) WITH PROPOFOL N/A 01/06/2021   portal gastropathy, no varices. Duodenal mucosa proximal to major papilla orifice friable and edematous appearing. Lymphangiectasia.    EUS N/A 10/28/2020   Procedure: UPPER ENDOSCOPIC ULTRASOUND (EUS) RADIAL;  Surgeon: Milus Banister, MD;  Location: [facility];  Service:  Endoscopy;  Laterality: N/A;   FINE NEEDLE ASPIRATION N/A 10/28/2020   Procedure: FINE NEEDLE ASPIRATION (FNA) LINEAR;  Surgeon: Milus Banister, MD;  Location: [facility];  Service: Endoscopy;  Laterality: N/A;   MULTIPLE TOOTH EXTRACTIONS     POLYPECTOMY  01/27/2021   Procedure: POLYPECTOMY INTESTINAL;  Surgeon: Eloise Harman, DO;  [facility];  Service: Endoscopy;;   VIDEO BRONCHOSCOPY N/A 10/12/2020   Procedure: VIDEO BRONCHOSCOPY WITH CRYOTHERAPY AND BALLOON DILATATION;  Surgeon: Garner Nash, DO;  Location: [place];  Service: Pulmonary;  Laterality: N/A;   VIDEO BRONCHOSCOPY WITH ENDOBRONCHIAL ULTRASOUND N/A 10/02/2018   Procedure: VIDEO BRONCHOSCOPY WITH ENDOBRONCHIAL ULTRASOUND;  Surgeon: Garner Nash, DO;  Location: [place];  Service: Thoracic;  Laterality: N/A;   VIDEO BRONCHOSCOPY WITH ENDOBRONCHIAL ULTRASOUND N/A 10/12/2020   Procedure: VIDEO BRONCHOSCOPY WITH ENDOBRONCHIAL ULTRASOUND;  Surgeon: Garner Nash, DO;  Location: [place];  Service: Pulmonary;  Laterality: N/A;    Social History:   reports that he quit smoking about 2 years ago. His smoking use included cigarettes. He smoked an average of 2 packs per day. His smokeless tobacco use includes snuff. He reports current alcohol use of about 4.0 standard drinks of alcohol per week. He reports that he does not use drugs.   Family History:  His family history includes Diabetes in an other family member; Heart disease in his father and mother; Hypertension in an other family member; Ovarian cancer in his maternal grandmother. There is no history of Liver disease or Colon cancer.   Allergies: No Known Allergies   Home Medications: Prior to Admission medications   Medication Sig Start Date End Date Taking? Authorizing Provider  albuterol (PROVENTIL) (2.5 MG/3ML) 0.083% nebulizer solution TAKE 3ML BY MOUTH VIA NEBULIZER EVERY 4 HOURS AS NEEDED FOR WHEEZING OR SHORTNESS OF BREATH Patient taking differently:  Take 2.5 mg by nebulization every 4 (four) hours as needed for shortness of breath. 09/15/20   Icard, Octavio Graves, DO  albuterol (VENTOLIN HFA) 108 (90 Base) MCG/ACT inhaler INHALE 2 PUFFS BY MOUTH EVERY 6 HOURS AS NEEDED FOR SHORTNESS OF BREATH Patient taking differently: Inhale 2 puffs into the lungs every 6 (six) hours as needed for shortness of breath. 12/20/20   Icard, Leory Plowman L, DO  ascorbic acid (VITAMIN C) 500 MG tablet Take 500 mg by mouth daily.    [provider]  ATIVAN 0.5 MG tablet Take 0.5 mg by mouth as needed. 03/02/21   [provider]  cholecalciferol (VITAMIN D3) 25 MCG (1000 UNIT) tablet Take 1,000 Units by mouth daily.    [provider]  feeding supplement, ENSURE ENLIVE, (ENSURE ENLIVE) LIQD Take 237 mLs by mouth 2 (two) times daily between meals. Patient taking differently: Take 237 mLs by mouth daily. 09/24/18   Florencia Reasons, MD  fexofenadine (ALLEGRA) 180 MG  tablet Take 180 mg by mouth daily.    [provider]  Fluticasone-Umeclidin-Vilant (TRELEGY ELLIPTA) 100-62.5-25 MCG/INH AEPB Inhale 1 puff into the lungs daily. 12/20/20   Icard, Octavio Graves, DO  furosemide (LASIX) 20 MG tablet Take 1 tablet (20 mg total) by mouth daily. 02/17/21 03/19/21  Manuella Ghazi, Pratik D, DO  mirtazapine (REMERON) 7.5 MG tablet Take 1 tablet (7.5 mg total) by mouth at bedtime. Patient not taking: Reported on 03/23/2021 02/16/21   Heath Lark D, DO  oxyCODONE (OXY IR/ROXICODONE) 5 MG immediate release tablet Take 5 mg by mouth as needed. 03/02/21   [provider]  potassium chloride SA (KLOR-CON) 20 MEQ tablet Take 1 tablet (20 mEq total) by mouth 2 (two) times daily. 01/07/21   Little Ishikawa, MD  spironolactone (ALDACTONE) 25 MG tablet Take 2 tablets (50 mg total) by mouth daily. 02/17/21 03/19/21  Heath Lark D, DO    Critical care time: 1 minutes   Lestine Mount, [facility] 05/26/21 4:51 AM  Please see [email or web address] for pager  details.  From 7A-7P if no response, please call [phone] After hours, please call ELink [phone]

## 2021-05-26 NOTE — ED Notes (Signed)
Patient finally asleep.

## 2021-05-26 NOTE — ED Notes (Signed)
edp aware of blood pressures

## 2021-05-26 NOTE — ED Notes (Signed)
Pt confused and unable to redirect. Family at bedside to assist. Foot of bed elevated. Pt repositioned. edp asked for medication to assist. Ativan given per orders.

## 2021-05-26 NOTE — Progress Notes (Signed)
PCCM brief progress   65 year old man with alcohol abuse and cirrhosis, hypertension, COPD, squamous cell lung cancer and pancreatic cancer (last Rx appears to be February 2022).  Presented to [facility] ED 7/27 with confusion, disorientation.  Per family he had apparently been confused and confabulating for several days prior to admission, had possibly increased his alcohol use due to the death of his brother.  Evaluation revealed Na 101.  CCM recommended initiation 3% NaCl and transfer to ICU at [facility].  Vitals:   05/26/21 0900 05/26/21 0924 05/26/21 1000 05/26/21 1138  BP: 100/62  94/65   Pulse: 99  (!) 102   Resp: 17  18   Temp:    98.2 F (36.8 C)  TempSrc:    Oral  SpO2: 97% 100% 100%   Weight:      Height:        Awaiting f/u chem -- RN has been called with critical value of Na 105, then 109 although not showing yet in chart   Drowsy but easily wakes, calm  VSS   Impression/plan -   Severe hyponatremia - likely r/t underlying cirrhosis, lung and pancreatic cancer.  Na 101>102>102>105?>109? - d/c 3% saline - NS @75ml /hr  - f/u Na q6h  Acute encephalopathy - improving. Chem pending  -monitor closely for s/s withdrawal - may need to resume CIWA - holding for now r/t somnolence -hold sedating meds   ETOH cirrhosis  - holding diuretics  - hold sedating meds   COPD - on trelegy as outpt  - continue BD   Squamous cell lung ca  Pancreatic ca  -palliative care consult when awake    Nickolas Madrid, NP Pulmonary/Critical Care Medicine  05/26/2021  1:55 PM

## 2021-05-27 DIAGNOSIS — C3490 Malignant neoplasm of unspecified part of unspecified bronchus or lung: Secondary | ICD-10-CM

## 2021-05-27 DIAGNOSIS — E871 Hypo-osmolality and hyponatremia: Secondary | ICD-10-CM | POA: Diagnosis not present

## 2021-05-27 DIAGNOSIS — K7030 Alcoholic cirrhosis of liver without ascites: Secondary | ICD-10-CM

## 2021-05-27 DIAGNOSIS — K7031 Alcoholic cirrhosis of liver with ascites: Secondary | ICD-10-CM

## 2021-05-27 DIAGNOSIS — C799 Secondary malignant neoplasm of unspecified site: Secondary | ICD-10-CM

## 2021-05-27 DIAGNOSIS — Z7189 Other specified counseling: Secondary | ICD-10-CM

## 2021-05-27 LAB — BASIC METABOLIC PANEL
Anion gap: 11 (ref 5–15)
BUN: <5 mg/dL — ABNORMAL LOW (ref 8–23)
CO2: 19 mmol/L — ABNORMAL LOW (ref 22–32)
Calcium: 7.7 mg/dL — ABNORMAL LOW (ref 8.9–10.3)
Chloride: 85 mmol/L — ABNORMAL LOW (ref 98–111)
Creatinine, Ser: 0.7 mg/dL (ref 0.61–1.24)
GFR, Estimated: >60 mL/min (ref >60)
Glucose, Bld: 89 mg/dL (ref 70–99)
Potassium: 3.8 mmol/L (ref 3.5–5.1)
Sodium: 115 mmol/L — CL (ref 135–145)

## 2021-05-27 LAB — SODIUM
Sodium: 115 mmol/L — CL (ref 135–145)
Sodium: 117 mmol/L — CL (ref 135–145)

## 2021-05-27 MED ORDER — ORAL CARE MOUTH RINSE
15.0000 mL | Freq: Two times a day (BID) | OROMUCOSAL | Status: DC
  Administered 2021-05-27 – 2021-05-31 (×8): 15 mL via OROMUCOSAL

## 2021-05-27 MED ORDER — POTASSIUM CHLORIDE 20 MEQ PO PACK
40.0000 meq | PACK | Freq: Once | ORAL | Status: AC
  Administered 2021-05-27: 40 meq via ORAL
  Filled 2021-05-27: qty 2

## 2021-05-27 MED ORDER — STERILE WATER FOR INJECTION IJ SOLN
INTRAMUSCULAR | Status: AC
  Filled 2021-05-27: qty 10

## 2021-05-27 NOTE — Progress Notes (Signed)
NAME:  Ronald [name], MRN:  [id number], DOB:  1956/08/25, LOS: 1 ADMISSION DATE:  05/25/2021, CONSULTATION DATE:  7/28 REFERRING MD:  Stark Jock, CHIEF COMPLAINT:  confusion   History of Present Illness:  65 y/o male admitted with confusion in the setting of hyponatremia.   Admitted to the ICU in the setting of 3% saline administration.  Pertinent  Medical History  Depression Hypertension COPD Decompensated EtOH cirrhosis Lung cancer Pancreatic cancer  Significant Hospital Events: Including procedures, antibiotic start and stop dates in addition to other pertinent events   7/28 admission  Interim History / Subjective:  More awake Eating Talking some Off hypertonic saline  Objective   Blood pressure (!) 83/57, pulse 96, temperature (!) 97.4 F (36.3 C), temperature source Axillary, resp. rate 16, height 5\' 4"  (1.626 m), weight 60.6 kg, SpO2 99 %.        Intake/Output Summary (Last 24 hours) at 05/27/2021 4782 Last data filed at 05/27/2021 0700 Gross per 24 hour  Intake 907.87 ml  Output 2900 ml  Net -1992.13 ml   Filed Weights   05/25/21 2214 05/26/21 0553  Weight: 60.8 kg 60.6 kg    Examination:  General:  Resting comfortably in bed HENT: NCAT OP clear PULM: CTA B, normal effort CV: RRR, no mgr GI: BS+, soft, nontender MSK: normal bulk and tone Neuro: drowsy but wakes up, [facility] Problem list     Assessment & Plan:  Severe hyponatremia due to cirrhosis and possibly potomania> improved Continue saline administration at this point Free water restrict Monitor BMET and UOP Replace electrolytes as needed  Acute encephalopathy due to hyponatremia, ? Hepatic encephalopathy Hold sedating medications Improving without treatment of hepatic encephalopathy, so will continue to hold lactulose/rifaximin Supportive care Keep K > 4  Decompensated EtOH cirrhosis Ascites EtOH abuse Holding lasix and spironolactone today while giving  fluids  COPD Incruise and Breo  Melena> resolved monitor  Hypertension Monitor hemodynamics today without antihypertensives  Squamous cell lung cancer Pancreatic cancer F/u with onc after  Goals of care : f/u palliative care, overall it seems reasonable to consider hospice with a comfort focus rather than maintaining full code.  Best Practice (right click and "Reselect all SmartList Selections" daily)   Diet/type: Regular consistency (see orders) DVT prophylaxis: prophylactic heparin  GI prophylaxis: N/A Lines: N/A Foley:  N/A Code Status:  full code Last date of multidisciplinary goals of care discussion [7/29]  Labs   CBC: Recent Labs  Lab 05/25/21 2241  WBC 22.9*  HGB 10.8*  HCT 29.1*  MCV 90.9  PLT 956    Basic Metabolic Panel: Recent Labs  Lab 05/25/21 2241 05/26/21 0008 05/26/21 0442 05/26/21 1256 05/26/21 1806 05/27/21 0027  NA 101* 102* 102* 110* 111* 115*  K 4.3 4.3  --  3.9  --   --   CL 68* 67*  --  77*  --   --   CO2 20* 22  --  23  --   --   GLUCOSE 78 71  --  76  --   --   BUN <5* <5*  --  <5*  --   --   CREATININE 0.38* 0.49*  --  0.56*  --   --   CALCIUM 7.1* 7.4*  --  7.3*  --   --    GFR: Estimated Creatinine Clearance: 76.1 mL/min (A) (by C-G formula based on SCr of 0.56 mg/dL (L)). Recent Labs  Lab 05/25/21 2241 05/26/21 0003  05/26/21 0217  WBC 22.9*  --   --   LATICACIDVEN  --  4.3* 3.1*    Liver Function Tests: Recent Labs  Lab 05/25/21 2241  AST 98*  ALT 42  ALKPHOS 658*  BILITOT 7.1*  PROT 6.4*  ALBUMIN 2.1*   No results for input(s): LIPASE, AMYLASE in the last 168 hours. Recent Labs  Lab 05/25/21 2314  AMMONIA 24    ABG    Component Value Date/Time   HCO3 19.9 (L) 05/25/2021 2241   ACIDBASEDEF 4.0 (H) 05/25/2021 2241   O2SAT 10.1 05/25/2021 2241     Coagulation Profile: No results for input(s): INR, PROTIME in the last 168 hours.  Cardiac Enzymes: No results for input(s): CKTOTAL, CKMB,  CKMBINDEX, TROPONINI in the last 168 hours.  HbA1C: No results found for: HGBA1C  CBG: Recent Labs  Lab 05/25/21 2321 05/26/21 0553 05/26/21 1913  GLUCAP 81 75 88    Critical care time: n/a    Roselie Awkward, MD [place] P[facility]M Pager: [phone] Cell: [phone] After 7:00 pm call Elink  [phone]

## 2021-05-27 NOTE — Consult Note (Signed)
Consultation Note Date: 05/27/2021   Patient Name: Ronald [name]  DOB: 14-Aug-1956  MRN: [id number]  Age / Sex: 65 y.o., male  PCP: [facility] Referring Physician: Juanito Doom, MD  Reason for Consultation: Establishing goals of care  HPI/Patient Profile: 65 y.o. 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 Palliative medicine consulted for [facility].    Clinical Assessment and Goals of Care: On evaluation patient awakes briefly to my voice. He does not answer any of my questions. Not able to participate in [place].  I called his sister- Ronald [name]. She notes that patient has been receiving Palliative services from [facility].  I called [facility]- he is actually on their Hospice service. Hospice nurse there stated that his previous goals had been not to return to hospital- however, he did not want an in home DNR- no reason noted for this contraindication in goals. He receives weekly visits from an aide and a nurse. Ronald [name] shares that patient's mental status and capacity has waxed and wanted for several months. He drinks heavily and is intoxicated most of the time. He is mostly bound to his couch. Family assists with ADL's.  Recently, he has a daughter- Ronald [name] who has moved in and been assisting in care.  He does not have an HCPOA- discussed with Ronald [name] that in [place] legally that makes Ronald [name] his primary Air traffic controller.  Ronald [name] feels that current GOC would be for him to return home with hospice- but needs to be discussed with other family members.  Not able to reach Ronald [name] by phone- Ronald [name] agrees to followup meeting this afternoon- she will try and reach Ronald [name] and have her  present.   Primary Decision Maker NEXT OF KIN- daughter- Ronald [name]    SUMMARY OF RECOMMENDATIONS -Continue current care -Plan to meet with daughter, Ronald [name]- and sister Ronald [name] this afternoon at 3:30 for further clarification of GOC- discussed that even if sodium is corrected- it is likely to quickly drop again after discharge due to chronic illness, will plan to discuss potential comfort care and residential hospice vs home with hospice- will also address code status at that time   Code Status/Advance Care Planning: Full code  Discharge Planning: To Be Determined  Primary Diagnoses: Present on Admission:  Hyponatremia   I have reviewed the medical record, interviewed the patient and family, and examined the patient. The following aspects are pertinent.  Past Medical History:  Diagnosis Date   Alcoholism ([place])    Anemia    iron deficiency   Cancer ([place])    lung Stage I- 10/11/20   Collapse of left lung    lower lobe   COPD (chronic obstructive pulmonary disease) (HCC)    Depression    Dyspnea    Exertion   Family history of adverse reaction to anesthesia    sister had  a bad headache   Hypertension    10/11/20 - not in years   Mediastinal adenopathy     and endobronchial lesion   Pneumonia    TB (pulmonary tuberculosis)    Wears dentures    Social History   Socioeconomic History   Marital status: Divorced    Spouse name: Not on file   Number of children: Not on file   Years of education: Not on file   Highest education level: Not on file  Occupational History   Not on file  Tobacco Use   Smoking status: Former    Packs/day: 2.00    Types: Cigarettes    Quit date: 09/19/2018    Years since quitting: 2.6   Smokeless tobacco: Current    Types: Snuff  Vaping Use   Vaping Use: Never used  Substance and Sexual Activity   Alcohol use: Yes    Alcohol/week: 4.0 standard drinks    Types: 4 Cans of beer per week    Comment: occas   Drug use: No   Sexual  activity: Never  Other Topics Concern   Not on file  Social History Narrative   Not on file   Social Determinants of Health   Financial Resource Strain: Not on file  Food Insecurity: Not on file  Transportation Needs: Not on file  Physical Activity: Not on file  Stress: Not on file  Social Connections: Not on file   Scheduled Meds:  Chlorhexidine Gluconate Cloth  6 each Topical Q0600   feeding supplement  237 mL Oral Q24H   fluticasone furoate-vilanterol  1 puff Inhalation Daily   pantoprazole (PROTONIX) IV  40 mg Intravenous Daily   umeclidinium bromide  1 puff Inhalation Daily   Continuous Infusions:  sodium chloride Stopped (05/26/21 0326)   sodium chloride 50 mL/hr at 05/27/21 0839   PRN Meds:.sodium chloride, albuterol Medications Prior to Admission:  Prior to Admission medications   Medication Sig Start Date End Date Taking? Authorizing Provider  acetaminophen (TYLENOL) 500 MG tablet Take 500 mg by mouth daily as needed for mild pain.   Yes [provider]  albuterol (PROVENTIL) (2.5 MG/3ML) 0.083% nebulizer solution TAKE 3ML BY MOUTH VIA NEBULIZER EVERY 4 HOURS AS NEEDED FOR WHEEZING OR SHORTNESS OF BREATH Patient taking differently: Take 2.5 mg by nebulization every 4 (four) hours as needed for shortness of breath. 09/15/20  Yes Icard, Bradley L, DO  albuterol (VENTOLIN HFA) 108 (90 Base) MCG/ACT inhaler INHALE 2 PUFFS BY MOUTH EVERY 6 HOURS AS NEEDED FOR SHORTNESS OF BREATH Patient taking differently: Inhale 2 puffs into the lungs every 6 (six) hours as needed for shortness of breath. 12/20/20  Yes Icard, Bradley L, DO  ascorbic acid (VITAMIN C) 500 MG tablet Take 500 mg by mouth daily.   Yes [provider]  ATIVAN 0.5 MG tablet Take 0.5 mg by mouth every 4 (four) hours as needed for anxiety. 03/02/21  Yes [provider]  cholecalciferol (VITAMIN D3) 25 MCG (1000 UNIT) tablet Take 1,000 Units by mouth daily.   Yes [provider]   fexofenadine (ALLEGRA) 180 MG tablet Take 180 mg by mouth daily.   Yes [provider]  Fluticasone-Umeclidin-Vilant (TRELEGY ELLIPTA) 100-62.5-25 MCG/INH AEPB Inhale 1 puff into the lungs daily. 12/20/20  Yes Icard, Bradley L, DO  furosemide (LASIX) 20 MG tablet Take 1 tablet (20 mg total) by mouth daily. 02/17/21 05/26/21 Yes Shah, Pratik D, DO  ondansetron (ZOFRAN) 8 MG tablet Take 8 mg  by mouth every 8 (eight) hours as needed for nausea/vomiting. 04/28/21  Yes [provider]  pantoprazole (PROTONIX) 20 MG tablet Take 20 mg by mouth daily. 05/04/21  Yes [provider]  potassium chloride SA (KLOR-CON) 20 MEQ tablet Take 1 tablet (20 mEq total) by mouth 2 (two) times daily. 01/07/21  Yes Little Ishikawa, MD  spironolactone (ALDACTONE) 25 MG tablet Take 2 tablets (50 mg total) by mouth daily. 02/17/21 05/26/21 Yes Shah, Pratik D, DO  traMADol (ULTRAM) 50 MG tablet Take 50 mg by mouth every 6 (six) hours as needed for pain. 05/03/21  Yes [provider]  feeding supplement, ENSURE ENLIVE, (ENSURE ENLIVE) LIQD Take 237 mLs by mouth 2 (two) times daily between meals. Patient not taking: Reported on 05/26/2021 09/24/18   Florencia Reasons, MD  mirtazapine (REMERON) 7.5 MG tablet Take 1 tablet (7.5 mg total) by mouth at bedtime. Patient not taking: No sig reported 02/16/21   Heath Lark D, DO   No Known Allergies Review of Systems  Physical Exam  Vital Signs: BP (!) 81/55   Pulse 95   Temp 98.1 F (36.7 C) (Oral)   Resp 14   Ht 5\' 4"  (1.626 m)   Wt 60.6 kg   SpO2 100%   BMI 22.93 kg/m  Pain Scale: 0-10   Pain Score: 0-No pain   SpO2: SpO2: 100 % O2 Device:SpO2: 100 % O2 Flow Rate: .   IO: Intake/output summary:  Intake/Output Summary (Last 24 hours) at 05/27/2021 1045 Last data filed at 05/27/2021 0900 Gross per 24 hour  Intake 1000 ml  Output 2375 ml  Net -1375 ml    LBM: Last BM Date: 05/27/21 Baseline Weight: Weight: 60.8 kg Most recent weight:  Weight: 60.6 kg     Palliative Assessment/Data: PPS: 10%     Thank you for this consult. Palliative medicine will continue to follow and assist as needed.   Time In: 1108 Time Out: 1219 Time Total: 71 mins Greater than 50%  of this time was spent counseling and coordinating care related to the above assessment and plan.  Signed by: Mariana Kaufman, AGNP-C Palliative Medicine    Please contact Palliative Medicine Team phone at [phone] for questions and concerns.  For individual provider: See Shea Evans

## 2021-05-27 NOTE — Progress Notes (Signed)
Additional non face to face encounter-  Met with patient's sister and daughter.  Ronald [name] has had ongoing decline for more than several weeks. He has only been eating jello and yogurt and drinking beer for weeks. Has been couch bound for almost a month.  They had initially hoped that he would be able to stop drinking and this would improve his health.  We discussed his current illness- the hyponatremia possibly related to his beer intake, his liver disease or his metastatic cancer. Regardless of the cause- he has several chronic illnesses that are limiting his time and quality of life and he is likely to die from in the next month or so.  We discussed the stages of dying and indications of imminent end of life.  Ronald [name] expressed that he would not want to return to the hospital if he went home and declined again- his preference would be to stay at home and comfortable and proceed with natural dying process. They previously had Hospice with [facility]. However, they have been unresponsive when Ronald [name] has called for assistance. They would like to change Hospice services. He lives in [place].  Code status was discussed- all agreed that DNR was in patient's best interest and aligned with his goals of care.   Recommendations:  -Stabilize as much as possible until patient can be safely discharged home with Hospice- noted he is eating and sodium is improving- hopefully will be within a few days -TOC consult to assist with finding new [place]- he will need to be admitted to Hospice before discharge so that family has support  Mariana Kaufman, AGNP-C Palliative Medicine  Please call Palliative Medicine team phone with any questions [phone]. For individual providers please see AMION.  Total time: 41 minutes

## 2021-05-27 NOTE — Progress Notes (Signed)
[place] Progress Note Patient Name: Ronald [name] DOB: 22-Jan-1956 MRN: [id number]   Date of Service  05/27/2021  HPI/Events of Note  Na+ = 110 --> 111 --> 115.  eICU Interventions  Plan: Decrease 0.9 NaCl IV infusion to 50 mL/hour. Continue to trend Na+.     Intervention Category Major Interventions: Electrolyte abnormality - evaluation and management  Chirag Krueger Eugene 05/27/2021, 4:24 AM

## 2021-05-27 NOTE — Progress Notes (Signed)
Date and time results received: 05/27/21 1:26 AM  (use smartphrase ".now" to insert current time)  Test: Sodium Critical Value: 115  Name of Provider Notified: Warren Lacy  Orders Received? Or Actions Taken?:  awaiting new orders

## 2021-05-28 LAB — CBC
HCT: 23.6 % — ABNORMAL LOW (ref 39.0–52.0)
Hemoglobin: 8.6 g/dL — ABNORMAL LOW (ref 13.0–17.0)
MCH: 33.6 pg (ref 26.0–34.0)
MCHC: 36.4 g/dL — ABNORMAL HIGH (ref 30.0–36.0)
MCV: 92.2 fL (ref 80.0–100.0)
Platelets: 135 10*3/uL — ABNORMAL LOW (ref 150–400)
RBC: 2.56 MIL/uL — ABNORMAL LOW (ref 4.22–5.81)
RDW: 18.3 % — ABNORMAL HIGH (ref 11.5–15.5)
WBC: 9 10*3/uL (ref 4.0–10.5)
nRBC: 0 % (ref 0.0–0.2)

## 2021-05-28 LAB — CBC WITH DIFFERENTIAL/PLATELET
Abs Immature Granulocytes: 0.08 10*3/uL — ABNORMAL HIGH (ref 0.00–0.07)
Basophils Absolute: 0 10*3/uL (ref 0.0–0.1)
Basophils Relative: 0 %
Eosinophils Absolute: 0 10*3/uL (ref 0.0–0.5)
Eosinophils Relative: 0 %
HCT: 22.6 % — ABNORMAL LOW (ref 39.0–52.0)
Hemoglobin: 8.3 g/dL — ABNORMAL LOW (ref 13.0–17.0)
Immature Granulocytes: 1 %
Lymphocytes Relative: 5 %
Lymphs Abs: 0.5 10*3/uL — ABNORMAL LOW (ref 0.7–4.0)
MCH: 33.9 pg (ref 26.0–34.0)
MCHC: 36.7 g/dL — ABNORMAL HIGH (ref 30.0–36.0)
MCV: 92.2 fL (ref 80.0–100.0)
Monocytes Absolute: 0.8 10*3/uL (ref 0.1–1.0)
Monocytes Relative: 8 %
Neutro Abs: 8.4 10*3/uL — ABNORMAL HIGH (ref 1.7–7.7)
Neutrophils Relative %: 86 %
Platelets: 148 10*3/uL — ABNORMAL LOW (ref 150–400)
RBC: 2.45 MIL/uL — ABNORMAL LOW (ref 4.22–5.81)
RDW: 18.2 % — ABNORMAL HIGH (ref 11.5–15.5)
WBC: 9.8 10*3/uL (ref 4.0–10.5)
nRBC: 0 % (ref 0.0–0.2)

## 2021-05-28 LAB — PHOSPHORUS: Phosphorus: 1.6 mg/dL — ABNORMAL LOW (ref 2.5–4.6)

## 2021-05-28 LAB — COMPREHENSIVE METABOLIC PANEL
ALT: 52 U/L — ABNORMAL HIGH (ref 0–44)
ALT: 61 U/L — ABNORMAL HIGH (ref 0–44)
AST: 115 U/L — ABNORMAL HIGH (ref 15–41)
AST: 147 U/L — ABNORMAL HIGH (ref 15–41)
Albumin: 1.5 g/dL — ABNORMAL LOW (ref 3.5–5.0)
Albumin: 1.5 g/dL — ABNORMAL LOW (ref 3.5–5.0)
Alkaline Phosphatase: 593 U/L — ABNORMAL HIGH (ref 38–126)
Alkaline Phosphatase: 607 U/L — ABNORMAL HIGH (ref 38–126)
Anion gap: 10 (ref 5–15)
Anion gap: 9 (ref 5–15)
BUN: <5 mg/dL — ABNORMAL LOW (ref 8–23)
BUN: 5 mg/dL — ABNORMAL LOW (ref 8–23)
CO2: 21 mmol/L — ABNORMAL LOW (ref 22–32)
CO2: 20 mmol/L — ABNORMAL LOW (ref 22–32)
Calcium: 7.7 mg/dL — ABNORMAL LOW (ref 8.9–10.3)
Calcium: 7.7 mg/dL — ABNORMAL LOW (ref 8.9–10.3)
Chloride: 87 mmol/L — ABNORMAL LOW (ref 98–111)
Chloride: 90 mmol/L — ABNORMAL LOW (ref 98–111)
Creatinine, Ser: 0.66 mg/dL (ref 0.61–1.24)
Creatinine, Ser: 0.72 mg/dL (ref 0.61–1.24)
GFR, Estimated: >60 mL/min (ref >60)
GFR, Estimated: >60 mL/min (ref >60)
Glucose, Bld: 111 mg/dL — ABNORMAL HIGH (ref 70–99)
Glucose, Bld: 160 mg/dL — ABNORMAL HIGH (ref 70–99)
Potassium: 4.3 mmol/L (ref 3.5–5.1)
Potassium: 4 mmol/L (ref 3.5–5.1)
Sodium: 118 mmol/L — CL (ref 135–145)
Sodium: 119 mmol/L — CL (ref 135–145)
Total Bilirubin: 5.2 mg/dL — ABNORMAL HIGH (ref 0.3–1.2)
Total Bilirubin: 5.7 mg/dL — ABNORMAL HIGH (ref 0.3–1.2)
Total Protein: 4.8 g/dL — ABNORMAL LOW (ref 6.5–8.1)
Total Protein: 5 g/dL — ABNORMAL LOW (ref 6.5–8.1)

## 2021-05-28 LAB — MAGNESIUM: Magnesium: 1.3 mg/dL — ABNORMAL LOW (ref 1.7–2.4)

## 2021-05-28 MED ORDER — LORAZEPAM 2 MG/ML IJ SOLN: 0.0000 mg | Freq: Four times a day (QID) | INTRAMUSCULAR | Status: AC

## 2021-05-28 MED ORDER — ADULT MULTIVITAMIN W/MINERALS CH
1.0000 | ORAL_TABLET | Freq: Every day | ORAL | Status: DC
  Administered 2021-05-28 – 2021-05-31 (×4): 1 via ORAL
  Filled 2021-05-28 (×4): qty 1

## 2021-05-28 MED ORDER — ACETAMINOPHEN 325 MG PO TABS: 650.0000 mg | ORAL_TABLET | Freq: Four times a day (QID) | ORAL | Status: DC | PRN

## 2021-05-28 MED ORDER — LORAZEPAM 2 MG/ML IJ SOLN: 1.0000 mg | INTRAMUSCULAR | Status: AC | PRN

## 2021-05-28 MED ORDER — LORAZEPAM 2 MG/ML IJ SOLN
0.0000 mg | Freq: Two times a day (BID) | INTRAMUSCULAR | Status: DC
Start: 2021-05-30 — End: 2021-06-01

## 2021-05-28 MED ORDER — THIAMINE HCL 100 MG PO TABS
100.0000 mg | ORAL_TABLET | Freq: Every day | ORAL | Status: DC
  Administered 2021-05-28 – 2021-05-31 (×4): 100 mg via ORAL
  Filled 2021-05-28 (×4): qty 1

## 2021-05-28 MED ORDER — MORPHINE SULFATE (PF) 2 MG/ML IV SOLN: 1.0000 mg | INTRAVENOUS | Status: DC | PRN

## 2021-05-28 MED ORDER — FOLIC ACID 1 MG PO TABS
1.0000 mg | ORAL_TABLET | Freq: Every day | ORAL | Status: DC
  Administered 2021-05-28 – 2021-05-31 (×4): 1 mg via ORAL
  Filled 2021-05-28 (×4): qty 1

## 2021-05-28 MED ORDER — THIAMINE HCL 100 MG/ML IJ SOLN: 100.0000 mg | Freq: Every day | INTRAMUSCULAR | Status: DC

## 2021-05-28 MED ORDER — LORAZEPAM 1 MG PO TABS: 1.0000 mg | ORAL_TABLET | ORAL | Status: AC | PRN

## 2021-05-28 NOTE — Progress Notes (Signed)
Date and time results received: 05/28/21 1758 Test: Sodium Critical Value: 119 Name of Provider Notified: Candiss Norse MD is aware, previous sodium 118.

## 2021-05-28 NOTE — Plan of Care (Signed)

## 2021-05-28 NOTE — Progress Notes (Signed)
PROGRESS NOTE                                                                                                                                                                                                             Patient Demographics:    Ronald [name], is a 65 y.o. male, DOB - 1955/12/04, OXB:[id number]  Outpatient Primary MD for the patient is [facility]    LOS - 2  Admit date - 05/25/2021    Chief Complaint  Patient presents with   Altered Mental Status       Brief Narrative (HPI from H&P) - 65 y/o male with history of ongoing alcohol abuse, alcoholic cirrhosis, with home hospice status currently, recently diagnosed with squamous cell lung cancer and pancreatic cancer, who still drinking large amounts of alcohol was admitted to [name] with confusion due to severe hyponatremia sodium of 101, he was transferred to [facility], ICU for 3% normal saline administration, he was stabilized and transferred to hospitalist service on 05/28/2021.    Subjective:    Ronald [name] today has, No headache, No chest pain, No abdominal pain - No Nausea, No new weakness tingling or numbness, no SOB.   Assessment  & Plan :      AMS by severe hyponatremia - he s/p 3% normal saline for a day, mentation has improved, continue gentle hydration and monitor, sodium gradually improving.  2.  History of ongoing alcohol abuse, alcoholic cirrhosis with home hospice status at home.  Confirmed with daughter he is DNR and will be discharged home with home hospice, counseled to quit alcohol place him on CIWA protocol last drink was 05/26/2021.  3.  Diagnosis of squamous cell lung cancer and pancreatic cancer.  Hospice status.  4.  History of COPD.  Supportive care.  Stable.  5.  HX of melena.  On PPI, supportive care not a candidate for EGD or colonoscopy.  Home hospice upon discharge.         Condition -  Extremely Guarded  Family Communication  :  daughter Ronald [name] [phone] - 05/28/21  Code Status :  DNR  Consults  :  PCCM, [place]  PUD Prophylaxis : PPI   Procedures  :            Disposition Plan  :  Status is: Inpatient  Remains inpatient appropriate because:IV treatments appropriate due to intensity of illness or inability to take PO  Dispo: The patient is from: Home              Anticipated d/c is to: Home              Patient currently is not medically stable to d/c.   Difficult to place patient No   DVT Prophylaxis  :    Place and maintain sequential compression device Start: 05/26/21 1305  Lab Results  Component Value Date   PLT 148 (L) 05/28/2021    Diet :  Diet Order             Diet Heart Room service appropriate? Yes; Fluid consistency: Thin  Diet effective now                    Inpatient Medications  Scheduled Meds:  Chlorhexidine Gluconate Cloth  6 each Topical Q0600   feeding supplement  237 mL Oral Q24H   fluticasone furoate-vilanterol  1 puff Inhalation Daily   mouth rinse  15 mL Mouth Rinse BID   pantoprazole (PROTONIX) IV  40 mg Intravenous Daily   umeclidinium bromide  1 puff Inhalation Daily   Continuous Infusions:  sodium chloride Stopped (05/26/21 0326)   sodium chloride 50 mL/hr at 05/27/21 0839   PRN Meds:.sodium chloride, acetaminophen, albuterol, morphine injection  Antibiotics  :    Anti-infectives (From admission, onward)    Start     Dose/Rate Route Frequency Ordered Stop   05/26/21 0130  vancomycin (VANCOCIN) IVPB 1000 mg/200 mL premix        1,000 mg 200 mL/hr over 60 Minutes Intravenous  Once 05/26/21 0120 05/26/21 0357   05/26/21 0130  piperacillin-tazobactam (ZOSYN) IVPB 3.375 g        3.375 g 12.5 mL/hr over 240 Minutes Intravenous  Once 05/26/21 0120 05/26/21 0506        Time Spent in minutes  30   Lala Lund M.D on 05/28/2021 at 1:07 PM  To page go to [email or web address]   [facility] -  Office  [phone]    See all Orders from today for further details    Objective:   Vitals:   05/28/21 0400 05/28/21 0634 05/28/21 0721 05/28/21 1111  BP: (!) 91/59 93/64 104/64 92/62  Pulse:   96 90  Resp: 16 18 18 18   Temp: 98.3 F (36.8 C) 98.2 F (36.8 C) 98.7 F (37.1 C) 98.9 F (37.2 C)  TempSrc: Oral Oral Oral Oral  SpO2: 95% 94% 94%   Weight:      Height:        Wt Readings from Last 3 Encounters:  05/26/21 60.6 kg  03/23/21 66.3 kg  02/14/21 64 kg     Intake/Output Summary (Last 24 hours) at 05/28/2021 1307 Last data filed at 05/28/2021 1100 Gross per 24 hour  Intake 320 ml  Output 850 ml  Net -530 ml     Physical Exam  Awake Alert, No new F.N deficits, Normal affect [place].AT,PERRAL Supple Neck,No JVD, No cervical lymphadenopathy appriciated.  Symmetrical Chest wall movement, Good air movement bilaterally, CTAB RRR,No Gallops,Rubs or new Murmurs, No Parasternal Heave +ve B.Sounds, Abd Soft, No tenderness, No organomegaly appriciated, No rebound - guarding or rigidity. No Cyanosis, Clubbing or edema, No new Rash or bruise       Data Review:    CBC Recent Labs  Lab 05/25/21 2241  05/28/21 0527  WBC 22.9* 9.8  HGB 10.8* 8.3*  HCT 29.1* 22.6*  PLT 242 148*  MCV 90.9 92.2  MCH 33.8 33.9  MCHC 37.1* 36.7*  RDW 16.7* 18.2*  LYMPHSABS  --  0.5*  MONOABS  --  0.8  EOSABS  --  0.0  BASOSABS  --  0.0    Recent Labs  Lab 05/25/21 2241 05/25/21 2314 05/26/21 0003 05/26/21 0008 05/26/21 0217 05/26/21 0442 05/26/21 1256 05/26/21 1806 05/27/21 0027 05/27/21 0614 05/27/21 1156 05/28/21 0102  NA 101*  --   --  102*  --    < > 110* 111* 115* 115* 117* 118*  K 4.3  --   --  4.3  --   --  3.9  --   --  3.8  --  4.3  CL 68*  --   --  67*  --   --  77*  --   --  85*  --  87*  CO2 20*  --   --  22  --   --  23  --   --  19*  --  21*  GLUCOSE 78  --   --  71  --   --  76  --   --  89  --  111*  BUN <5*  --   --  <5*  --   --  <5*   --   --  <5*  --  <5*  CREATININE 0.38*  --   --  0.49*  --   --  0.56*  --   --  0.70  --  0.66  CALCIUM 7.1*  --   --  7.4*  --   --  7.3*  --   --  7.7*  --  7.7*  AST 98*  --   --   --   --   --   --   --   --   --   --  115*  ALT 42  --   --   --   --   --   --   --   --   --   --  52*  ALKPHOS 658*  --   --   --   --   --   --   --   --   --   --  593*  BILITOT 7.1*  --   --   --   --   --   --   --   --   --   --  5.2*  ALBUMIN 2.1*  --   --   --   --   --   --   --   --   --   --  1.5*  LATICACIDVEN  --   --  4.3*  --  3.1*  --   --   --   --   --   --   --   AMMONIA  --  24  --   --   --   --   --   --   --   --   --   --    < > = values in this interval not displayed.    ------------------------------------------------------------------------------------------------------------------ No results for input(s): CHOL, HDL, LDLCALC, TRIG, CHOLHDL, LDLDIRECT in the last 72 hours.  No results found for: HGBA1C ------------------------------------------------------------------------------------------------------------------ No results for input(s): TSH, T4TOTAL, T3FREE, THYROIDAB in the last 72 hours.  Invalid input(s): FREET3  Cardiac Enzymes No results for input(s): CKMB, TROPONINI, MYOGLOBIN in the last 168 hours.  Invalid input(s): CK ------------------------------------------------------------------------------------------------------------------    Component Value Date/Time   BNP 79.0 10/27/2018 2254      Radiology Reports DG Chest 2 View  Result Date: 05/25/2021 CLINICAL DATA:  Altered mental status. EXAM: CHEST - 2 VIEW COMPARISON:  Chest plain film, dated January 05, 2021 FINDINGS: A stable linear and patchy opacity is seen within the mid left lung and left perihilar region. Mild left-sided volume loss is noted. An ill-defined nipple shadow is suspected overlying the right lung base. There is no evidence of a pleural effusion or pneumothorax. The heart size and  mediastinal contours are within normal limits. There is marked severity calcification of the aortic arch. A bone island is seen overlying the fourth right rib. The visualized skeletal structures are otherwise unremarkable. IMPRESSION: 1. Stable left perihilar/mid left lung opacity which may correlate to the findings consistent with recurrent disease within the left hilum seen on the prior nuclear medicine PET/CT (dated September 30, 2020). 2. No evidence of an acute infiltrate. Electronically Signed   By: Virgina Norfolk M.D.   On: 05/25/2021 23:22   CT Head Wo Contrast  Result Date: 05/25/2021 CLINICAL DATA:  Altered mental status. EXAM: CT HEAD WITHOUT CONTRAST TECHNIQUE: Contiguous axial images were obtained from the base of the skull through the vertex without intravenous contrast. COMPARISON:  January 05, 2021 FINDINGS: Brain: There is moderate severity cerebral atrophy with widening of the extra-axial spaces and ventricular dilatation. There are areas of decreased attenuation within the white matter tracts of the supratentorial brain, consistent with microvascular disease changes. Vascular: No hyperdense vessel or unexpected calcification. Skull: Normal. Negative for fracture or focal lesion. Sinuses/Orbits: No acute finding. Other: It should be noted study is there is difficult and subsequently limited secondary to extensive amount of patient motion. IMPRESSION: 1. Markedly limited study secondary to patient motion. 2. No acute intracranial abnormality. 3. Generalized cerebral atrophy. Electronically Signed   By: Virgina Norfolk M.D.   On: 05/25/2021 23:08

## 2021-05-28 NOTE — Progress Notes (Addendum)
CRITICAL VALUE STICKER  CRITICAL VALUE: Na 118  RECEIVER (on-site recipient of call): Margaretmary Bayley RN  DATE & TIME NOTIFIED: 05/28/21 @ [facility] (representative from lab):  MD NOTIFIED: [place]   TIME OF NOTIFICATION: 4859  RESPONSE:  Physician responded. No new orders received

## 2021-05-29 DIAGNOSIS — E871 Hypo-osmolality and hyponatremia: Secondary | ICD-10-CM | POA: Diagnosis not present

## 2021-05-29 LAB — BASIC METABOLIC PANEL
Anion gap: 8 (ref 5–15)
BUN: 6 mg/dL — ABNORMAL LOW (ref 8–23)
CO2: 20 mmol/L — ABNORMAL LOW (ref 22–32)
Calcium: 7.9 mg/dL — ABNORMAL LOW (ref 8.9–10.3)
Chloride: 91 mmol/L — ABNORMAL LOW (ref 98–111)
Creatinine, Ser: 0.76 mg/dL (ref 0.61–1.24)
GFR, Estimated: >60 mL/min (ref >60)
Glucose, Bld: 108 mg/dL — ABNORMAL HIGH (ref 70–99)
Potassium: 3.8 mmol/L (ref 3.5–5.1)
Sodium: 119 mmol/L — CL (ref 135–145)

## 2021-05-29 MED ORDER — POTASSIUM PHOSPHATES 15 MMOLE/5ML IV SOLN
30.0000 mmol | Freq: Once | INTRAVENOUS | Status: AC
  Administered 2021-05-29: 30 mmol via INTRAVENOUS
  Filled 2021-05-29: qty 10

## 2021-05-29 MED ORDER — MAGNESIUM SULFATE 2 GM/50ML IV SOLN
2.0000 g | Freq: Once | INTRAVENOUS | Status: AC
  Administered 2021-05-29: 2 g via INTRAVENOUS
  Filled 2021-05-29: qty 50

## 2021-05-29 MED ORDER — LACTATED RINGERS IV SOLN: INTRAVENOUS | Status: DC

## 2021-05-29 MED ORDER — LACTATED RINGERS IV SOLN: INTRAVENOUS | Status: AC

## 2021-05-29 MED ORDER — ZOLPIDEM TARTRATE 5 MG PO TABS
5.0000 mg | ORAL_TABLET | Freq: Every evening | ORAL | Status: DC | PRN
  Administered 2021-05-30: 5 mg via ORAL
  Filled 2021-05-29: qty 1

## 2021-05-29 NOTE — Progress Notes (Signed)
Date and time results received: 05/29/21 10:08 AM Test: Sodium Critical Value: 119 Name of Provider Notified: Candiss Norse MD aware, previous sodium 119.

## 2021-05-29 NOTE — Evaluation (Signed)
Physical Therapy Evaluation Patient Details Name: Ronald [name] MRN: [id number] DOB: 12-10-1955 Today's Date: 05/29/2021   History of Present Illness  Pt is a 65 y.o. M who was admitted due to severe hyponatremia and received 3% normal saline administration. 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  Clinical Impression  PTA, pt independent with mobility using a RW. Pt presents with decreased functional mobility secondary to generalized weakness, balance deficits and decreased activity tolerance. Requiring min assist for bed mobility/transfers, ambulating x 30 feet with a walker at a min guard assist level. HR peak 130 bpm. Would benefit from follow up HHPT to address deficits and maximize functional mobility.     Follow Up Recommendations Home health PT;Supervision for mobility/OOB (unsure if able to get with home hospice; asked CM, [facility])    Equipment Recommendations  None recommended by PT    Recommendations for Other Services       Precautions / Restrictions Precautions Precautions: Fall;Other (comment) Precaution Comments: watch HR Restrictions Weight Bearing Restrictions: No      Mobility  Bed Mobility Overal bed mobility: Needs Assistance Bed Mobility: Supine to Sit;Sit to Supine     Supine to sit: Min assist Sit to supine: Supervision   General bed mobility comments: MinA for trunk to upright    Transfers Overall transfer level: Needs assistance Equipment used: Rolling walker (2 wheeled) Transfers: Sit to/from Stand Sit to Stand: Min assist         General transfer comment: Light minA to boost to standing position from edge of bed  Ambulation/Gait Ambulation/Gait assistance: Min guard Gait Distance (Feet): 30 Feet Assistive device: Rolling walker (2 wheeled) Gait Pattern/deviations: Step-through pattern;Decreased stride  length Gait velocity: decreased   General Gait Details: Min guard for safety, dynamic instability  Stairs            Wheelchair Mobility    Modified Rankin (Stroke Patients Only)       Balance Overall balance assessment: Needs assistance Sitting-balance support: Feet supported Sitting balance-Leahy Scale: Good     Standing balance support: Bilateral upper extremity supported Standing balance-Leahy Scale: Poor Standing balance comment: reliant on RW                             Pertinent Vitals/Pain Pain Assessment: Faces Faces Pain Scale: Hurts little more Pain Location: back Pain Descriptors / Indicators: Grimacing;Discomfort Pain Intervention(s): Monitored during session;Limited activity within patient's tolerance    Home Living Family/patient expects to be discharged to:: Private residence Living Arrangements: Children (daughter) Available Help at Discharge: Family Type of Home: House         Home Equipment: Environmental consultant - 2 wheels;Shower seat      Prior Function Level of Independence: Needs assistance   Gait / Transfers Assistance Needed: using walker     Comments: home hospice 2x/wk     Hand Dominance        Extremity/Trunk Assessment   Upper Extremity Assessment Upper Extremity Assessment: Generalized weakness    Lower Extremity Assessment Lower Extremity Assessment: Generalized weakness       Communication   Communication: No difficulties  Cognition Arousal/Alertness: Awake/alert Behavior During Therapy: WFL for tasks assessed/performed Overall Cognitive Status: Within Functional Limits for tasks assessed  General Comments      Exercises     Assessment/Plan    PT Assessment Patient needs continued PT services  PT Problem List Decreased strength;Decreased activity tolerance;Decreased balance;Decreased mobility       PT Treatment Interventions DME  instruction;Gait training;Stair training;Functional mobility training;Therapeutic activities;Therapeutic exercise;Balance training;Patient/family education    PT Goals (Current goals can be found in the Care Plan section)  Acute Rehab PT Goals Patient Stated Goal: get stronger PT Goal Formulation: With patient Time For Goal Achievement: 06/12/21 Potential to Achieve Goals: Good    Frequency Min 3X/week   Barriers to discharge        Co-evaluation               AM-PAC PT "6 Clicks" Mobility  Outcome Measure Help needed turning from your back to your side while in a flat bed without using bedrails?: A Little Help needed moving from lying on your back to sitting on the side of a flat bed without using bedrails?: A Little Help needed moving to and from a bed to a chair (including a wheelchair)?: A Little Help needed standing up from a chair using your arms (e.g., wheelchair or bedside chair)?: A Little Help needed to walk in hospital room?: A Little Help needed climbing 3-5 steps with a railing? : A Lot 6 Click Score: 17    End of Session Equipment Utilized During Treatment: Gait belt Activity Tolerance: Patient tolerated treatment well Patient left: in bed;with call bell/phone within reach;with bed alarm set;with family/visitor present Nurse Communication: Mobility status PT Visit Diagnosis: Unsteadiness on feet (R26.81);Muscle weakness (generalized) (M62.81);Difficulty in walking, not elsewhere classified (R26.2)    Time: 4388-8757 PT Time Calculation (min) (ACUTE ONLY): 16 min   Charges:   PT Evaluation $PT Eval Moderate Complexity: 1 Mod          Wyona Almas, PT, DPT Acute Rehabilitation Services Pager [phone] Office [phone]   Deno Etienne 05/29/2021, 4:06 PM

## 2021-05-29 NOTE — Progress Notes (Signed)
PROGRESS NOTE                                                                                                                                                                                                             Patient Demographics:    Ronald [name], is a 65 y.o. 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    Subjective:   Patient in bed, appears comfortable, denies any headache, no fever, no chest pain or pressure, no shortness of breath , no abdominal pain. No new focal weakness.    Assessment  & Plan :      AMS by severe hyponatremia - he s/p 3% normal saline for a day, mentation has improved, urine sodium was less than 10 and numbers consistent with pure dehydration, adjusted IV fluids on 05/29/2021 continue to monitor BMP closely, sodium and chloride levels gradually improving.  2.  History of ongoing alcohol abuse, alcoholic cirrhosis with home hospice status at home.  Confirmed with daughter he is DNR and will be discharged home with home hospice, counseled to quit alcohol place him on CIWA protocol last drink was 05/26/2021.  3.  Diagnosis of squamous cell lung cancer and pancreatic cancer.  Hospice status.  4.  History of COPD.  Supportive  care.  Stable.  5.  HX of melena.  On PPI, supportive care not a candidate for EGD or colonoscopy.  Home hospice upon discharge.         Condition - Extremely Guarded  Family Communication  :  daughter [name] [phone] - 05/28/21  Code Status :  DNR  Consults  :  PCCM, [place]  PUD  Prophylaxis : PPI   Procedures  :            Disposition Plan  :    Status is: Inpatient  Remains inpatient appropriate because:IV treatments appropriate due to intensity of illness or inability to take PO  Dispo: The patient is from: Home              Anticipated d/c is to: Home              Patient currently is not medically stable to d/c.   Difficult to place patient No   DVT Prophylaxis  :    Place and maintain sequential compression device Start: 05/26/21 1305  Lab Results  Component Value Date   PLT 135 (L) 05/28/2021    Diet :  Diet Order             Diet Heart Room service appropriate? No; Fluid consistency: Thin; Fluid restriction: 1500 mL Fluid  Diet effective now                    Inpatient Medications  Scheduled Meds:  Chlorhexidine Gluconate Cloth  6 each Topical Q0600   feeding supplement  237 mL Oral Q24H   fluticasone furoate-vilanterol  1 puff Inhalation Daily   folic acid  1 mg Oral Daily   LORazepam  0-4 mg Intravenous Q6H   Followed by   Derrill Memo ON 05/30/2021] LORazepam  0-4 mg Intravenous Q12H   mouth rinse  15 mL Mouth Rinse BID   multivitamin with minerals  1 tablet Oral Daily   pantoprazole (PROTONIX) IV  40 mg Intravenous Daily   thiamine  100 mg Oral Daily   Or   thiamine  100 mg Intravenous Daily   umeclidinium bromide  1 puff Inhalation Daily   Continuous Infusions:  potassium PHOSPHATE IVPB (in mmol) 30 mmol (05/29/21 1000)   PRN Meds:.acetaminophen, albuterol, LORazepam **OR** LORazepam, morphine injection  Antibiotics  :    Anti-infectives (From admission, onward)    Start     Dose/Rate Route Frequency Ordered Stop    05/26/21 0130  vancomycin (VANCOCIN) IVPB 1000 mg/200 mL premix        1,000 mg 200 mL/hr over 60 Minutes Intravenous  Once 05/26/21 0120 05/26/21 0357   05/26/21 0130  piperacillin-tazobactam (ZOSYN) IVPB 3.375 g        3.375 g 12.5 mL/hr over 240 Minutes Intravenous  Once 05/26/21 0120 05/26/21 0506        Time Spent in minutes  30   Lala Lund M.D on 05/29/2021 at 10:41 AM  To page go to [email or web address]   [facility] -  Office  [phone]    See all Orders from today for further details    Objective:   Vitals:   05/29/21 0316 05/29/21 0746 05/29/21 0829 05/29/21 0830  BP: 96/70 96/69    Pulse: 96 93    Resp: 16 20    Temp:  98.1 F (36.7 C)    TempSrc: Oral Oral    SpO2: 96% 98% 97% 97%  Weight: 61 kg     Height:        Wt Readings from Last 3 Encounters:  05/29/21 61 kg  03/23/21 66.3 kg  02/14/21 64 kg     Intake/Output Summary (Last 24 hours) at 05/29/2021 1041 Last data filed at 05/29/2021 0341 Gross per 24 hour  Intake 240 ml  Output 1350 ml  Net -1110 ml     Physical  Exam  Awake Alert, No new F.N deficits, Normal affect [place].AT,PERRAL Supple Neck,No JVD, No cervical lymphadenopathy appriciated.  Symmetrical Chest wall movement, Good air movement bilaterally, CTAB RRR,No Gallops, Rubs or new Murmurs, No Parasternal Heave +ve B.Sounds, Abd Soft, No tenderness, No organomegaly appriciated, No rebound - guarding or rigidity. No Cyanosis, Clubbing or edema, No new Rash or bruise      Data Review:    CBC Recent Labs  Lab 05/25/21 2241 05/28/21 0527 05/28/21 1604  WBC 22.9* 9.8 9.0  HGB 10.8* 8.3* 8.6*  HCT 29.1* 22.6* 23.6*  PLT 242 148* 135*  MCV 90.9 92.2 92.2  MCH 33.8 33.9 33.6  MCHC 37.1* 36.7* 36.4*  RDW 16.7* 18.2* 18.3*  LYMPHSABS  --  0.5*  --   MONOABS  --  0.8  --   EOSABS  --  0.0  --   BASOSABS  --  0.0  --     Recent Labs  Lab 05/25/21 2241 05/25/21 2314 05/26/21 0003 05/26/21 0008 05/26/21 0217  05/26/21 0442 05/26/21 1256 05/26/21 1806 05/27/21 0614 05/27/21 1156 05/28/21 0102 05/28/21 1604 05/29/21 0815  NA 101*  --   --    < >  --    < > 110*   < > 115* 117* 118* 119* 119*  K 4.3  --   --    < >  --   --  3.9  --  3.8  --  4.3 4.0 3.8  CL 68*  --   --    < >  --   --  77*  --  85*  --  87* 90* 91*  CO2 20*  --   --    < >  --   --  23  --  19*  --  21* 20* 20*  GLUCOSE 78  --   --    < >  --   --  76  --  89  --  111* 160* 108*  BUN <5*  --   --    < >  --   --  <5*  --  <5*  --  <5* 5* 6*  CREATININE 0.38*  --   --    < >  --   --  0.56*  --  0.70  --  0.66 0.72 0.76  CALCIUM 7.1*  --   --    < >  --   --  7.3*  --  7.7*  --  7.7* 7.7* 7.9*  AST 98*  --   --   --   --   --   --   --   --   --  115* 147*  --   ALT 42  --   --   --   --   --   --   --   --   --  52* 61*  --   ALKPHOS 658*  --   --   --   --   --   --   --   --   --  593* 607*  --   BILITOT 7.1*  --   --   --   --   --   --   --   --   --  5.2* 5.7*  --   ALBUMIN 2.1*  --   --   --   --   --   --   --   --   --  1.5* 1.5*  --   MG  --   --   --   --   --   --   --   --   --   --   --  1.3*  --   LATICACIDVEN  --   --  4.3*  --  3.1*  --   --   --   --   --   --   --   --   AMMONIA  --  24  --   --   --   --   --   --   --   --   --   --   --    < > = values in this interval not displayed.    ------------------------------------------------------------------------------------------------------------------ No results for input(s): CHOL, HDL, LDLCALC, TRIG, CHOLHDL, LDLDIRECT in the last 72 hours.  No results found for: HGBA1C ------------------------------------------------------------------------------------------------------------------ No results for input(s): TSH, T4TOTAL, T3FREE, THYROIDAB in the last 72 hours.  Invalid input(s): FREET3  Cardiac Enzymes No results for input(s): CKMB, TROPONINI, MYOGLOBIN in the last 168 hours.  Invalid input(s):  CK ------------------------------------------------------------------------------------------------------------------    Component Value Date/Time   BNP 79.0 10/27/2018 2254      Radiology Reports DG Chest 2 View  Result Date: 05/25/2021 CLINICAL DATA:  Altered mental status. EXAM: CHEST - 2 VIEW COMPARISON:  Chest plain film, dated January 05, 2021 FINDINGS: A stable linear and patchy opacity is seen within the mid left lung and left perihilar region. Mild left-sided volume loss is noted. An ill-defined nipple shadow is suspected overlying the right lung base. There is no evidence of a pleural effusion or pneumothorax. The heart size and mediastinal contours are within normal limits. There is marked severity calcification of the aortic arch. A bone island is seen overlying the fourth right rib. The visualized skeletal structures are otherwise unremarkable. IMPRESSION: 1. Stable left perihilar/mid left lung opacity which may correlate to the findings consistent with recurrent disease within the left hilum seen on the prior nuclear medicine PET/CT (dated September 30, 2020). 2. No evidence of an acute infiltrate. Electronically Signed   By: Virgina Norfolk M.D.   On: 05/25/2021 23:22   CT Head Wo Contrast  Result Date: 05/25/2021 CLINICAL DATA:  Altered mental status. EXAM: CT HEAD WITHOUT CONTRAST TECHNIQUE: Contiguous axial images were obtained from the base of the skull through the vertex without intravenous contrast. COMPARISON:  January 05, 2021 FINDINGS: Brain: There is moderate severity cerebral atrophy with widening of the extra-axial spaces and ventricular dilatation. There are areas of decreased attenuation within the white matter tracts of the supratentorial brain, consistent with microvascular disease changes. Vascular: No hyperdense vessel or unexpected calcification. Skull: Normal. Negative for fracture or focal lesion. Sinuses/Orbits: No acute finding. Other: It should be noted study is there  is difficult and subsequently limited secondary to extensive amount of patient motion. IMPRESSION: 1. Markedly limited study secondary to patient motion. 2. No acute intracranial abnormality. 3. Generalized cerebral atrophy. Electronically Signed   By: Virgina Norfolk M.D.   On: 05/25/2021 23:08

## 2021-05-29 NOTE — TOC Initial Note (Signed)
Transition of Care [facility]) - Initial/Assessment Note    Patient Details  Name: Ronald [name] MRN: [id number] Date of Birth: [birth year]  [facility] CM/SW Contact:    Pollie Friar, RN Phone Number: 05/29/2021, 4:34 PM  Clinical Narrative:                 TOC with referral to help family change hospice agencies. CM spoke to patients sister and she asked to use [facility]. CM has called [facility] and spoken to on call RN. She is going to check into how the switch works and see if they are able to see patient in the [place] area. She will update CM tomorrow. TOC following.   Expected Discharge Plan: Home w Hospice Care Barriers to Discharge: Continued Medical Work up   Patient Goals and CMS Choice   CMS Medicare.gov Compare Post Acute Care list provided to:: Patient Represenative (must comment) Choice offered to / list presented to : Sibling  Expected Discharge Plan and Services Expected Discharge Plan: [place]   Discharge Planning Services: CM Consult Post Acute Care Choice: Hospice Living arrangements for the past 2 months: Single Family Home                                      Prior Living Arrangements/Services Living arrangements for the past 2 months: Single Family Home Lives with:: Relatives Patient language and need for interpreter reviewed:: Yes        Need for Family Participation in Patient Care: Yes (Comment) Care giver support system in place?: Yes (comment)   Criminal Activity/Legal Involvement Pertinent to Current Situation/Hospitalization: No - Comment as needed  Activities of Daily Living Home Assistive Devices/Equipment: None ADL Screening (condition at time of admission) Patient's cognitive ability adequate to safely complete daily activities?: Yes Is the patient deaf or have difficulty hearing?: No Does the patient have difficulty seeing, even when wearing glasses/contacts?: No Does the  patient have difficulty concentrating, remembering, or making decisions?: Yes Patient able to express need for assistance with ADLs?: Yes Does the patient have difficulty dressing or bathing?: No Independently performs ADLs?: Yes (appropriate for developmental age) Does the patient have difficulty walking or climbing stairs?: Yes Weakness of Legs: Both Weakness of Arms/Hands: Both  Permission Sought/Granted                  Emotional Assessment           Psych Involvement: No (comment)  Admission diagnosis:  Hyponatremia [E87.1] Patient Active Problem List   Diagnosis Date Noted   Pancreatic mass 04/25/2021   Ascites 02/14/2021   Hepatic cirrhosis ([place]) 01/12/2021   Symptomatic anemia 01/05/2021   GI bleed 01/05/2021   Syncope 01/05/2021   Goals of care, counseling/discussion 11/03/2020   S/P bronchoscopy 10/14/2020   Endobronchial cancer, left ([place])    Squamous cell carcinoma lung, left ([place]) 05/01/2019   Lung cancer ([place]) 11/05/2018   Mediastinal adenopathy    Thrush, oral 09/27/2018   Protein-calorie malnutrition, severe 09/23/2018   Abnormal ECG 09/20/2018   CAD (coronary artery disease) 09/20/2018   Obstructive pneumonia 09/19/2018   Abnormal LFTs 09/19/2018   Hyponatremia 09/19/2018   Stage 3 severe COPD by GOLD classification ([place]) 09/19/2018   Hypertension 09/19/2018   Alcoholism ([place]) 09/19/2018   Anemia 09/19/2018   Lung mass 09/19/2018   PCP:  [facility]  [place]:   [place], [place] - [address] [place] [address] Phone: [phone] Fax: [phone]     Social Determinants of Health (SDOH) Interventions    Readmission Risk Interventions No flowsheet data found.

## 2021-05-29 NOTE — Plan of Care (Signed)

## 2021-05-30 ENCOUNTER — Encounter: Payer: Self-pay | Admitting: Oncology

## 2021-05-30 ENCOUNTER — Inpatient Hospital Stay (HOSPITAL_COMMUNITY)

## 2021-05-30 DIAGNOSIS — E871 Hypo-osmolality and hyponatremia: Secondary | ICD-10-CM | POA: Diagnosis not present

## 2021-05-30 HISTORY — PX: IR PARACENTESIS: IMG2679

## 2021-05-30 LAB — COMPREHENSIVE METABOLIC PANEL WITH GFR
ALT: 71 U/L — ABNORMAL HIGH (ref 0–44)
AST: 151 U/L — ABNORMAL HIGH (ref 15–41)
Albumin: 1.5 g/dL — ABNORMAL LOW (ref 3.5–5.0)
Alkaline Phosphatase: 712 U/L — ABNORMAL HIGH (ref 38–126)
Anion gap: 7 (ref 5–15)
BUN: 6 mg/dL — ABNORMAL LOW (ref 8–23)
CO2: 22 mmol/L (ref 22–32)
Calcium: 7.7 mg/dL — ABNORMAL LOW (ref 8.9–10.3)
Chloride: 90 mmol/L — ABNORMAL LOW (ref 98–111)
Creatinine, Ser: 0.65 mg/dL (ref 0.61–1.24)
GFR, Estimated: >60 mL/min
Glucose, Bld: 120 mg/dL — ABNORMAL HIGH (ref 70–99)
Potassium: 4.3 mmol/L (ref 3.5–5.1)
Sodium: 119 mmol/L — CL (ref 135–145)
Total Bilirubin: 6.2 mg/dL — ABNORMAL HIGH (ref 0.3–1.2)
Total Protein: 5.1 g/dL — ABNORMAL LOW (ref 6.5–8.1)

## 2021-05-30 LAB — CBC WITH DIFFERENTIAL/PLATELET
Abs Immature Granulocytes: 0.31 K/uL — ABNORMAL HIGH (ref 0.00–0.07)
Basophils Absolute: 0.1 K/uL (ref 0.0–0.1)
Basophils Relative: 1 %
Eosinophils Absolute: 0 K/uL (ref 0.0–0.5)
Eosinophils Relative: 0 %
HCT: 26.5 % — ABNORMAL LOW (ref 39.0–52.0)
Hemoglobin: 8.9 g/dL — ABNORMAL LOW (ref 13.0–17.0)
Immature Granulocytes: 3 %
Lymphocytes Relative: 6 %
Lymphs Abs: 0.7 K/uL (ref 0.7–4.0)
MCH: 33.6 pg (ref 26.0–34.0)
MCHC: 33.6 g/dL (ref 30.0–36.0)
MCV: 90.8 fL (ref 80.0–100.0)
Monocytes Absolute: 1.1 K/uL — ABNORMAL HIGH (ref 0.1–1.0)
Monocytes Relative: 10 %
Neutro Abs: 8.7 K/uL — ABNORMAL HIGH (ref 1.7–7.7)
Neutrophils Relative %: 80 %
Platelets: 124 K/uL — ABNORMAL LOW (ref 150–400)
RBC: 2.65 MIL/uL — ABNORMAL LOW (ref 4.22–5.81)
RDW: 19.8 % — ABNORMAL HIGH (ref 11.5–15.5)
WBC: 10.8 K/uL — ABNORMAL HIGH (ref 4.0–10.5)
nRBC: 0 % (ref 0.0–0.2)

## 2021-05-30 LAB — CREATININE, URINE, RANDOM: Creatinine, Urine: 97.67 mg/dL

## 2021-05-30 LAB — OSMOLALITY, URINE: Osmolality, Ur: 434 mosm/kg (ref 300–900)

## 2021-05-30 LAB — OSMOLALITY: Osmolality: 251 mOsm/kg — ABNORMAL LOW (ref 275–295)

## 2021-05-30 LAB — BRAIN NATRIURETIC PEPTIDE: B Natriuretic Peptide: 158 pg/mL — ABNORMAL HIGH (ref 0.0–100.0)

## 2021-05-30 LAB — MAGNESIUM: Magnesium: 1.7 mg/dL (ref 1.7–2.4)

## 2021-05-30 LAB — SODIUM, URINE, RANDOM: Sodium, Ur: 32 mmol/L

## 2021-05-30 LAB — URIC ACID: Uric Acid, Serum: 1.8 mg/dL — ABNORMAL LOW (ref 3.7–8.6)

## 2021-05-30 MED ORDER — LIDOCAINE HCL (PF) 1 % IJ SOLN
INTRAMUSCULAR | Status: DC | PRN
  Administered 2021-05-30: 10 mL

## 2021-05-30 MED ORDER — PANTOPRAZOLE SODIUM 40 MG PO TBEC
40.0000 mg | DELAYED_RELEASE_TABLET | Freq: Every day | ORAL | Status: DC
  Administered 2021-05-31: 40 mg via ORAL
  Filled 2021-05-30: qty 1

## 2021-05-30 MED ORDER — ALBUMIN HUMAN 25 % IV SOLN
50.0000 g | Freq: Once | INTRAVENOUS | Status: AC | PRN
  Administered 2021-05-30: 50 g via INTRAVENOUS
  Filled 2021-05-30: qty 200

## 2021-05-30 MED ORDER — LACTATED RINGERS IV SOLN: INTRAVENOUS | Status: DC

## 2021-05-30 MED ORDER — LIDOCAINE HCL 1 % IJ SOLN
INTRAMUSCULAR | Status: AC
  Filled 2021-05-30: qty 20

## 2021-05-30 NOTE — Progress Notes (Signed)
PROGRESS NOTE                                                                                                                                                                                                             Patient Demographics:    Ronald [name], is a 65 y.o. 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    Subjective:   Patient in bed, appears comfortable, denies any headache, no fever, no chest pain or pressure, no shortness of breath , no abdominal pain. No new focal weakness.   Assessment  & Plan :   AMS by severe hyponatremia - he is post 3% normal saline for a day, mentation has improved, urine sodium was less than 10 and numbers consistent with pure dehydration, seemed IV fluids for 2 days now holding further IV fluids, sodium and chloride levels gradually improving. Will try ultrasound-guided paracentesis as well on 05/30/2021 to remove any third spaced fluid.  2.  History of ongoing alcohol abuse, alcoholic cirrhosis with home hospice status at home.  Confirmed with daughter he is DNR and will be discharged home with home hospice, counseled to quit alcohol place him on CIWA protocol last drink was 05/26/2021.  3.  Diagnosis of squamous cell lung  cancer and pancreatic cancer.  Hospice status.  If sodium levels above 120 likely discharge on 05/31/2021 with home hospice.  4.  History of COPD.  Supportive care.  Stable.  5.  HX of melena.  On PPI, supportive care not a candidate for EGD or colonoscopy.  Home hospice upon discharge.         Condition - Extremely Guarded  Family  Communication  :  daughter [name] [phone] - 05/28/21  Code Status :  DNR  Consults  :  PCCM, [place]  PUD Prophylaxis : PPI   Procedures  :            Disposition Plan  :    Status is: Inpatient  Remains inpatient appropriate because:IV treatments appropriate due to intensity of illness or inability to take PO  Dispo: The patient is from: Home              Anticipated d/c is to: Home              Patient currently is not medically stable to d/c.   Difficult to place patient No   DVT Prophylaxis  :    Place and maintain sequential compression device Start: 05/26/21 1305  Lab Results  Component Value Date   PLT 124 (L) 05/30/2021    Diet :  Diet Order             Diet Heart Room service appropriate? No; Fluid consistency: Thin; Fluid restriction: 1200 mL Fluid  Diet effective now                    Inpatient Medications  Scheduled Meds:  Chlorhexidine Gluconate Cloth  6 each Topical Q0600   feeding supplement  237 mL Oral Q24H   fluticasone furoate-vilanterol  1 puff Inhalation Daily   folic acid  1 mg Oral Daily   LORazepam  0-4 mg Intravenous Q6H   Followed by   LORazepam  0-4 mg Intravenous Q12H   mouth rinse  15 mL Mouth Rinse BID   multivitamin with minerals  1 tablet Oral Daily   [START ON 05/31/2021] pantoprazole  40 mg Oral Daily   thiamine  100 mg Oral Daily   Or   thiamine  100 mg Intravenous Daily   umeclidinium bromide  1 puff Inhalation Daily   Continuous Infusions:  albumin human     PRN Meds:.acetaminophen, albumin human, albuterol, LORazepam **OR** LORazepam, morphine injection,  zolpidem  Antibiotics  :    Anti-infectives (From admission, onward)    Start     Dose/Rate Route Frequency Ordered Stop   05/26/21 0130  vancomycin (VANCOCIN) IVPB 1000 mg/200 mL premix        1,000 mg 200 mL/hr over 60 Minutes Intravenous  Once 05/26/21 0120 05/26/21 0357   05/26/21 0130  piperacillin-tazobactam (ZOSYN) IVPB 3.375 g        3.375 g 12.5 mL/hr over 240 Minutes Intravenous  Once 05/26/21 0120 05/26/21 0506        Time Spent in minutes  30   Lala Lund M.D on 05/30/2021 at 11:01 AM  To page go to [email or web address]   [facility] -  Office  [phone]    See all Orders from today for further details    Objective:   Vitals:   05/30/21 0536 05/30/21 0739 05/30/21 0746 05/30/21 0747  BP:  92/65    Pulse:  95    Resp:  20    Temp:  98.1 F (36.7 C)    TempSrc:  Oral    SpO2:  99% 99% 99%  Weight: 60.8 kg     Height:        Wt Readings from Last 3 Encounters:  05/30/21 60.8 kg  03/23/21 66.3 kg  02/14/21 64 kg     Intake/Output Summary (Last 24 hours) at 05/30/2021 1101 Last data filed at 05/30/2021 0855  Gross per 24 hour  Intake 1194.67 ml  Output 850 ml  Net 344.67 ml     Physical Exam  Awake Alert, No new F.N deficits, Normal affect [place].AT,PERRAL Supple Neck,No JVD, No cervical lymphadenopathy appriciated.  Symmetrical Chest wall movement, Good air movement bilaterally, CTAB RRR,No Gallops, Rubs or new Murmurs, No Parasternal Heave +ve B.Sounds, Abd Soft with moderate ascites but no tenderness No Cyanosis, Clubbing or edema, No new Rash or bruise       Data Review:    CBC Recent Labs  Lab 05/25/21 2241 05/28/21 0527 05/28/21 1604 05/30/21 0104  WBC 22.9* 9.8 9.0 10.8*  HGB 10.8* 8.3* 8.6* 8.9*  HCT 29.1* 22.6* 23.6* 26.5*  PLT 242 148* 135* 124*  MCV 90.9 92.2 92.2 90.8  MCH 33.8 33.9 33.6 33.6  MCHC 37.1* 36.7* 36.4* 33.6  RDW 16.7* 18.2* 18.3* 19.8*  LYMPHSABS  --  0.5*  --  0.7  MONOABS  --  0.8  --  1.1*   EOSABS  --  0.0  --  0.0  BASOSABS  --  0.0  --  0.1    Recent Labs  Lab 05/25/21 2241 05/25/21 2314 05/26/21 0003 05/26/21 0008 05/26/21 0217 05/26/21 0442 05/27/21 2694 05/27/21 1156 05/28/21 0102 05/28/21 1604 05/29/21 0815 05/30/21 0104  NA 101*  --   --    < >  --    < > 115* 117* 118* 119* 119* 119*  K 4.3  --   --    < >  --    < > 3.8  --  4.3 4.0 3.8 4.3  CL 68*  --   --    < >  --    < > 85*  --  87* 90* 91* 90*  CO2 20*  --   --    < >  --    < > 19*  --  21* 20* 20* 22  GLUCOSE 78  --   --    < >  --    < > 89  --  111* 160* 108* 120*  BUN <5*  --   --    < >  --    < > <5*  --  <5* 5* 6* 6*  CREATININE 0.38*  --   --    < >  --    < > 0.70  --  0.66 0.72 0.76 0.65  CALCIUM 7.1*  --   --    < >  --    < > 7.7*  --  7.7* 7.7* 7.9* 7.7*  AST 98*  --   --   --   --   --   --   --  115* 147*  --  151*  ALT 42  --   --   --   --   --   --   --  52* 61*  --  71*  ALKPHOS 658*  --   --   --   --   --   --   --  593* 607*  --  712*  BILITOT 7.1*  --   --   --   --   --   --   --  5.2* 5.7*  --  6.2*  ALBUMIN 2.1*  --   --   --   --   --   --   --  1.5* 1.5*  --  1.5*  MG  --   --   --   --   --   --   --   --   --  1.3*  --  1.7  LATICACIDVEN  --   --  4.3*  --  3.1*  --   --   --   --   --   --   --   AMMONIA  --  24  --   --   --   --   --   --   --   --   --   --   BNP  --   --   --   --   --   --   --   --   --   --   --  158.0*   < > = values in this interval not displayed.    ------------------------------------------------------------------------------------------------------------------ No results for input(s): CHOL, HDL, LDLCALC, TRIG, CHOLHDL, LDLDIRECT in the last 72 hours.  No results found for: HGBA1C ------------------------------------------------------------------------------------------------------------------ No results for input(s): TSH, T4TOTAL, T3FREE, THYROIDAB in the last 72 hours.  Invalid input(s): FREET3  Cardiac Enzymes No results for  input(s): CKMB, TROPONINI, MYOGLOBIN in the last 168 hours.  Invalid input(s): CK ------------------------------------------------------------------------------------------------------------------    Component Value Date/Time   BNP 158.0 (H) 05/30/2021 0104      Radiology Reports DG Chest 2 View  Result Date: 05/25/2021 CLINICAL DATA:  Altered mental status. EXAM: CHEST - 2 VIEW COMPARISON:  Chest plain film, dated January 05, 2021 FINDINGS: A stable linear and patchy opacity is seen within the mid left lung and left perihilar region. Mild left-sided volume loss is noted. An ill-defined nipple shadow is suspected overlying the right lung base. There is no evidence of a pleural effusion or pneumothorax. The heart size and mediastinal contours are within normal limits. There is marked severity calcification of the aortic arch. A bone island is seen overlying the fourth right rib. The visualized skeletal structures are otherwise unremarkable. IMPRESSION: 1. Stable left perihilar/mid left lung opacity which may correlate to the findings consistent with recurrent disease within the left hilum seen on the prior nuclear medicine PET/CT (dated September 30, 2020). 2. No evidence of an acute infiltrate. Electronically Signed   By: Virgina Norfolk M.D.   On: 05/25/2021 23:22   CT Head Wo Contrast  Result Date: 05/25/2021 CLINICAL DATA:  Altered mental status. EXAM: CT HEAD WITHOUT CONTRAST TECHNIQUE: Contiguous axial images were obtained from the base of the skull through the vertex without intravenous contrast. COMPARISON:  January 05, 2021 FINDINGS: Brain: There is moderate severity cerebral atrophy with widening of the extra-axial spaces and ventricular dilatation. There are areas of decreased attenuation within the white matter tracts of the supratentorial brain, consistent with microvascular disease changes. Vascular: No hyperdense vessel or unexpected calcification. Skull: Normal. Negative for fracture or  focal lesion. Sinuses/Orbits: No acute finding. Other: It should be noted study is there is difficult and subsequently limited secondary to extensive amount of patient motion. IMPRESSION: 1. Markedly limited study secondary to patient motion. 2. No acute intracranial abnormality. 3. Generalized cerebral atrophy. Electronically Signed   By: Virgina Norfolk M.D.   On: 05/25/2021 23:08

## 2021-05-30 NOTE — Evaluation (Signed)
Occupational Therapy Evaluation and Discharge Patient Details Name: Ronald [name] MRN: [id number] DOB: 12/11/1955 Today's Date: 05/30/2021    History of Present Illness Pt is a 65 y.o. M who was admitted due to severe hyponatremia and received 3% normal saline administration. 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   Clinical Impression   Pt reports he was assisted for ADL as needed and IADL by his daughter prior to admission. He states he does not bathe or change his clothes with any regularity as he doesn't do much of anything, chart review confirms pt is mostly on his couch, eats poorly and drinks beer. Pt presents with slow processing, weakness, poor standing balance and decreased endurance. Recommended tub transfer bench and hospital bed at home. He reports he had a hospital bed, but was unsure if it had been sent back as his family was switching home care agencies. Pt with poor medical prognosis and declined further OT, prefers to rely on his daughter's assist for ADL. Educated in energy conservation strategies during ADL.     Follow Up Recommendations  No OT follow up (Pt to return home with hospice care.)    Equipment Recommendations  Tub/shower bench;Hospital bed    Recommendations for Other Services       Precautions / Restrictions Precautions Precautions: Fall;Other (comment) Precaution Comments: watch HR and BP      Mobility Bed Mobility Overal bed mobility: Needs Assistance Bed Mobility: Supine to Sit;Sit to Supine     Supine to sit: Min assist Sit to supine: Supervision   General bed mobility comments: MinA for trunk to upright, pulled up on therapist's hand    Transfers Overall transfer level: Needs assistance Equipment used: Rolling walker (2 wheeled) Transfers: Sit to/from Stand Sit to Stand: Min assist         General  transfer comment: Light minA to boost to standing position from edge of bed, min guard from [facility]    Balance Overall balance assessment: Needs assistance   Sitting balance-Leahy Scale: Good       Standing balance-Leahy Scale: Poor Standing balance comment: reliant on RW                           ADL either performed or assessed with clinical judgement   ADL Overall ADL's : Needs assistance/impaired Eating/Feeding: Independent   Grooming: Wash/dry hands;Wash/dry face;Sitting;Set up   Upper Body Bathing: Minimal assistance;Sitting   Lower Body Bathing: Minimal assistance;Sit to/from stand   Upper Body Dressing : Set up;Sitting   Lower Body Dressing: Sit to/from stand;Minimal assistance   Toilet Transfer: Min guard;Ambulation;BSC;RW   Toileting- Clothing Manipulation and Hygiene: Minimal assistance;Sit to/from stand       Functional mobility during ADLs: Min guard;Rolling walker General ADL Comments: Pt stating with he has more abdominal fluid, his daughter assists him more.     Vision Patient Visual Report: No change from baseline       Perception     Praxis      Pertinent Vitals/Pain Pain Assessment: No/denies pain     Hand Dominance Right   Extremity/Trunk Assessment Upper Extremity Assessment Upper Extremity Assessment: Overall WFL for tasks assessed;Generalized weakness   Lower Extremity Assessment Lower Extremity Assessment: Defer to PT evaluation       Communication Communication Communication: HOH (vs slow processing)   Cognition Arousal/Alertness: Awake/alert Behavior During Therapy: Flat  affect Overall Cognitive Status: Impaired/Different from baseline Area of Impairment: Problem solving                             Problem Solving: Slow processing General Comments: attempting to spit into cup with a lid without self correction   General Comments       Exercises     Shoulder Instructions      Home Living  Family/patient expects to be discharged to:: Private residence Living Arrangements: Children (daughter) Available Help at Discharge: Family Type of Home: House             Bathroom Shower/Tub: Teaching laboratory technician Toilet: Standard     Home Equipment: Environmental consultant - 2 wheels;Bedside commode   Additional Comments: he had a hospital bed, but unsure if it is still there      Prior Functioning/Environment Level of Independence: Needs assistance  Gait / Transfers Assistance Needed: using walker ADL's / Homemaking Assistance Needed: assisted with showering, LB dressing and all IADL            OT Problem List: Decreased strength;Decreased activity tolerance;Impaired balance (sitting and/or standing);Decreased knowledge of use of DME or AE;Decreased cognition      OT Treatment/Interventions:      OT Goals(Current goals can be found in the care plan section) Acute Rehab OT Goals Patient Stated Goal: get stronger OT Goal Formulation: With patient  OT Frequency:     Barriers to D/C:            Co-evaluation              AM-PAC OT "6 Clicks" Daily Activity     Outcome Measure Help from another person eating meals?: None Help from another person taking care of personal grooming?: A Little Help from another person toileting, which includes using toliet, bedpan, or urinal?: A Little Help from another person bathing (including washing, rinsing, drying)?: A Little Help from another person to put on and taking off regular upper body clothing?: None Help from another person to put on and taking off regular lower body clothing?: A Little 6 Click Score: 20   End of Session Equipment Utilized During Treatment: Rolling walker;Gait belt  Activity Tolerance: Patient limited by fatigue (reported he did not sleep well) Patient left: in bed;with call bell/phone within reach;with bed alarm set  OT Visit Diagnosis: Other abnormalities of gait and mobility (R26.89);Unsteadiness on  feet (R26.81);Muscle weakness (generalized) (M62.81);Other symptoms and signs involving cognitive function                Time: 1025-1045 OT Time Calculation (min): 20 min Charges:  OT General Charges $OT Visit: 1 Visit OT Evaluation $OT Eval Moderate Complexity: 1 Mod Nestor Lewandowsky, OTR/L Acute Rehabilitation Services Pager: [phone] Office: [phone]    Malka So 05/30/2021, 10:50 AM

## 2021-05-30 NOTE — Progress Notes (Signed)
   05/30/21 0244  Notify: Provider  Provider Name/Title Dr. Nevada Crane  Date Provider Notified 05/30/21  Time Provider Notified [phone]  Notification Type Page (secure chat)  Notification Reason Critical result (Na level 119)  Provider response No new orders (continue current treatment)  Date of Provider Response 05/30/21  Time of Provider Response 0229   Patient has no new symptoms. Na level has been 119 since 05/28/21.

## 2021-05-30 NOTE — TOC Progression Note (Signed)
Transition of Care [facility]) - Progression Note    Patient Details  Name: Ronald [name] MRN: [id number] Date of Birth: 1956-03-20  Transition of Care [facility]) CM/SW Contact  Bartholomew Crews, RN Phone Number: [phone] 05/30/2021, 4:57 PM  Clinical Narrative:     [name] with [name] at [place]. DME has been ordered. Discussed that patient may be ready to transition tomorrow.   Spoke with patient's sister, [name]. DME is scheduled for delivery at 3pm tomorrow. DME does need to be delivered before patient returns. Verified address in Epic as the address patient will go at discharge. Patient will need PTAR for transportation.   TOC following for transition needs.   Expected Discharge Plan: Home w Hospice Care Barriers to Discharge: Continued Medical Work up  Expected Discharge Plan and Services Expected Discharge Plan: [place]   Discharge Planning Services: CM Consult Post Acute Care Choice: Hospice Living arrangements for the past 2 months: Single Family Home                                       Social Determinants of Health (SDOH) Interventions    Readmission Risk Interventions No flowsheet data found.

## 2021-05-30 NOTE — TOC Progression Note (Signed)
Transition of Care [facility]) - Progression Note    Patient Details  Name: Ronald [name] MRN: [id number] Date of Birth: 1956/05/09  Transition of Care [facility]) CM/SW Contact  Bartholomew Crews, RN Phone Number: [phone] 05/30/2021, 10:10 AM  Clinical Narrative:     Warm report received from previous CM. Sister is hoping patient to be discharged home today, but is requesting change in hospice agency to [place].   This CM spoke with [place] at [place] does cover area where patient lives. Samantha to reach out to sister.   TOC following.   Expected Discharge Plan: Home w Hospice Care Barriers to Discharge: Continued Medical Work up  Expected Discharge Plan and Services Expected Discharge Plan: [place]   Discharge Planning Services: CM Consult Post Acute Care Choice: Hospice Living arrangements for the past 2 months: Single Family Home                                       Social Determinants of Health (SDOH) Interventions    Readmission Risk Interventions No flowsheet data found.

## 2021-05-30 NOTE — Progress Notes (Signed)
Initial Nutrition Assessment  DOCUMENTATION CODES:  Not applicable  INTERVENTION:  Continue Ensure Enlive daily.  Add Magic cup TID with meals, each supplement provides 290 kcal and 9 grams of protein.  Continue CIWA.  NUTRITION DIAGNOSIS:  Inadequate oral intake related to chronic illness, cancer and cancer related treatments (EtOH addiction/abuse) as evidenced by per patient/family report.  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  RD reviewed pt's chart and determined that since pt is essentially finishing hospital stay to stabilize and go home to pass away, RD will provide what is similar to comfort measures at this time.  Pt has lost ~11 kg since January 2022. This is likely due to poor PO intake from EtOH consumption and from catabolic disease such as cancer.  RD to recommend adding Magic Cup TID to provide extra calories and protein.  Medications: reviewed; EE daily (refused), Ativan QID (refused), MVI with minerals, Protonix, thiamine  Labs: reviewed; Na 119 (L), Glucose 120 (H)  NUTRITION - FOCUSED PHYSICAL EXAM: Not appropriate to perform at this time  Diet Order:   Diet Order             Diet Heart Room service appropriate? No; Fluid consistency: Thin; Fluid restriction: 1200 mL Fluid  Diet effective now                   EDUCATION NEEDS:  Education needs have been addressed  Skin:  Skin Assessment: Skin Integrity Issues: Skin Integrity Issues:: Other (Comment) Other: Skin tears on BUE and back  Last BM:  05/29/21 - Type 5, black, large  Height:  Ht Readings from Last 1 Encounters:  05/26/21 5\' 4"  (1.626 m)   Weight:  Wt Readings from Last 1 Encounters:  05/30/21 60.8 kg   BMI:  Body mass index is 23.01 kg/m.  Estimated Nutritional Needs:  Kcal:  2000-2200 Protein:  75-90 grams Fluid:  >2 L  Derrel Nip, RD, LDN (she/her/hers) Registered Dietitian I After-Hours/Weekend Pager # in [place]

## 2021-05-30 NOTE — Procedures (Signed)
PROCEDURE SUMMARY:  Successful image-guided paracentesis from the right lower abdomen.  Yielded 5.1 liters of clear yellow fluid.  No immediate complications.  EBL = trace. Patient tolerated well.   Specimen was not sent for labs.  Please see imaging section of Epic for full dictation.   Armando Gang Annali Lybrand PA-C 05/30/2021 12:52 PM

## 2021-05-31 DIAGNOSIS — E871 Hypo-osmolality and hyponatremia: Secondary | ICD-10-CM | POA: Diagnosis not present

## 2021-05-31 LAB — CBC WITH DIFFERENTIAL/PLATELET
Abs Immature Granulocytes: 0.3 10*3/uL — ABNORMAL HIGH (ref 0.00–0.07)
Basophils Absolute: 0 10*3/uL (ref 0.0–0.1)
Basophils Relative: 0 %
Eosinophils Absolute: 0 10*3/uL (ref 0.0–0.5)
Eosinophils Relative: 0 %
HCT: 23.4 % — ABNORMAL LOW (ref 39.0–52.0)
Hemoglobin: 8.7 g/dL — ABNORMAL LOW (ref 13.0–17.0)
Immature Granulocytes: 3 %
Lymphocytes Relative: 7 %
Lymphs Abs: 0.7 10*3/uL (ref 0.7–4.0)
MCH: 34.3 pg — ABNORMAL HIGH (ref 26.0–34.0)
MCHC: 37.2 g/dL — ABNORMAL HIGH (ref 30.0–36.0)
MCV: 92.1 fL (ref 80.0–100.0)
Monocytes Absolute: 1.1 10*3/uL — ABNORMAL HIGH (ref 0.1–1.0)
Monocytes Relative: 11 %
Neutro Abs: 7.9 10*3/uL — ABNORMAL HIGH (ref 1.7–7.7)
Neutrophils Relative %: 79 %
Platelets: 102 10*3/uL — ABNORMAL LOW (ref 150–400)
RBC: 2.54 MIL/uL — ABNORMAL LOW (ref 4.22–5.81)
RDW: 18.3 % — ABNORMAL HIGH (ref 11.5–15.5)
Smear Review: DECREASED
WBC: 10 10*3/uL (ref 4.0–10.5)
nRBC: 0 % (ref 0.0–0.2)

## 2021-05-31 LAB — COMPREHENSIVE METABOLIC PANEL
ALT: 55 U/L — ABNORMAL HIGH (ref 0–44)
AST: 105 U/L — ABNORMAL HIGH (ref 15–41)
Albumin: 2.3 g/dL — ABNORMAL LOW (ref 3.5–5.0)
Alkaline Phosphatase: 570 U/L — ABNORMAL HIGH (ref 38–126)
Anion gap: 9 (ref 5–15)
BUN: 6 mg/dL — ABNORMAL LOW (ref 8–23)
CO2: 21 mmol/L — ABNORMAL LOW (ref 22–32)
Calcium: 8.1 mg/dL — ABNORMAL LOW (ref 8.9–10.3)
Chloride: 90 mmol/L — ABNORMAL LOW (ref 98–111)
Creatinine, Ser: 0.6 mg/dL — ABNORMAL LOW (ref 0.61–1.24)
GFR, Estimated: >60 mL/min (ref >60)
Glucose, Bld: 117 mg/dL — ABNORMAL HIGH (ref 70–99)
Potassium: 3.9 mmol/L (ref 3.5–5.1)
Sodium: 120 mmol/L — ABNORMAL LOW (ref 135–145)
Total Bilirubin: 6.3 mg/dL — ABNORMAL HIGH (ref 0.3–1.2)
Total Protein: 5.2 g/dL — ABNORMAL LOW (ref 6.5–8.1)

## 2021-05-31 LAB — MAGNESIUM: Magnesium: 1.6 mg/dL — ABNORMAL LOW (ref 1.7–2.4)

## 2021-05-31 LAB — BRAIN NATRIURETIC PEPTIDE: B Natriuretic Peptide: 289.9 pg/mL — ABNORMAL HIGH (ref 0.0–100.0)

## 2021-05-31 MED ORDER — SPIRONOLACTONE 25 MG PO TABS: 25.0000 mg | ORAL_TABLET | Freq: Every day | ORAL | 0 refills | Status: AC

## 2021-05-31 MED ORDER — MAGNESIUM SULFATE 2 GM/50ML IV SOLN
2.0000 g | Freq: Once | INTRAVENOUS | Status: AC
  Administered 2021-05-31: 2 g via INTRAVENOUS
  Filled 2021-05-31: qty 50

## 2021-05-31 NOTE — TOC Transition Note (Signed)
Transition of Care [facility]) - CM/SW Discharge Note   Patient Details  Name: Ronald [name] MRN: [id number] Date of Birth: 06-06-1956  Transition of Care [facility]) CM/SW Contact:  Carles Collet, RN Phone Number: 05/31/2021, 11:08 AM   Clinical Narrative:    Ronald [name] at [place] that patient will DC today. Spoke w sister Ronald [name] [phone], to notify her of DC. Confirmed address with her. She is agreeable to DC now and requests that nurse call her when PTAR arrives at the hospital to pick him up. PTAR scheduled for 3pm.  DME to be delivered today, by 3pm, confirmed w Ronald [name]. PTAR forms and DNR are on chart to go home w patient.     Final next level of care: Home w Hospice Care Barriers to Discharge: No Barriers Identified   Patient Goals and CMS Choice   CMS Medicare.gov Compare Post Acute Care list provided to:: Patient Represenative (must comment) Choice offered to / list presented to : Sibling  Discharge Placement                       Discharge Plan and Services   Discharge Planning Services: CM Consult Post Acute Care Choice: Hospice                      [facility] Agency: [place] Date [facility] Agency Contacted: 05/31/21 Time [place]: 1108 Representative spoke with at [place]: Ronald ([place]) Interventions     Readmission Risk Interventions No flowsheet data found.

## 2021-05-31 NOTE — Progress Notes (Signed)
PT Cancellation Note  Patient Details Name: Ronald [name] MRN: [id number] DOB: [birth year]   Cancelled Treatment:    Reason Eval/Treat Not Completed: Other (comment) politely declines PT this afternoon as he wishes to focus his energy on returning home (PTAR supposed to arrive shortly per chart). Thank you for the opportunity to participate in his care!   Windell Norfolk, DPT, PN1   Supplemental Physical Therapist [facility]    Pager [phone] Acute Rehab Office [phone]

## 2021-05-31 NOTE — Discharge Instructions (Addendum)
Disposition.  Residential hospice Condition.  Guarded CODE STATUS.  DNR Activity.  With assistance as tolerated, full fall precautions. Diet.  Soft with feeding assistance and aspiration precautions. 1.5 L/day strict fluid restriction. Goal of care.  Comfort.

## 2021-05-31 NOTE — Progress Notes (Signed)
Patient discharged to home for hospice. Care relinquished to EMS transport. [name] (sister) and [name] (daughter) were called to notify that he would be on his way.

## 2021-05-31 NOTE — Discharge Summary (Signed)
Ronald [name]:[id number] DOB: [birth year] DOA: 05/25/2021  PCP: [facility] date: 05/25/2021  Discharge date: 05/31/2021  Admitted From: Home  Disposition:  Home with Hospice   Recommendations for Outpatient Follow-up:   Follow up with PCP in 1-2 weeks  PCP Please obtain BMP/CBC, 2 view CXR in 1week,  (see Discharge instructions)   PCP Please follow up on the following pending results:    Home Health: None   Equipment/Devices: None  Consultations: None  Discharge Condition: Guarded   CODE STATUS: DNR   Diet Recommendation: Soft diet with 1.5 L strict fluid restriction per day    Chief Complaint  Patient presents with   Altered Mental Status     Brief history of present illness from the day of admission and additional interim summary    65 y/o male with history of ongoing alcohol abuse, alcoholic cirrhosis, with home hospice status currently, 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                                                                 Hospital Course    AMS by severe hyponatremia - he is post 3% normal saline for a day, mentation has improved, urine sodium was less than 10 and numbers consistent with intravascular dehydration, see gentle IV fluids for 2 days along with ultrasound-guided paracentesis for ascites fluid removal.  Sodium levels improved and now close to 120, he is symptom-free, wants to go home with home hospice will be discharged with home  hospice goal of care is comfort.  Continue 1500 cc total fluid restriction at home upon discharge, counseled to quit alcohol use, low-dose Aldactone prescribed upon discharge for ongoing ascites and fluid accumulation.  Again goal of care is comfort.   2.  History of ongoing alcohol abuse, alcoholic cirrhosis with home hospice status at home.  Confirmed with daughter he is DNR and will be discharged home with home hospice, counseled to quit alcohol .   3.  Diagnosis of squamous cell lung cancer and pancreatic cancer.  Hospice status.  Full of care is comfort continue at home with home hospice.   4.  History of COPD.  Supportive care.  Stable.   5.  HX of melena.  On PPI, supportive care not a candidate for EGD or colonoscopy.  No melena here, home hospice upon discharge.  Goal of care is comfort   Discharge diagnosis     Active Problems:   Hyponatremia  Discharge instructions    Discharge Instructions     Discharge instructions   Complete by: As directed    Disposition.  Residential hospice Condition.  Guarded CODE STATUS.  DNR Activity.  With assistance as tolerated, full fall precautions. Diet.  Soft with feeding assistance and aspiration precautions.  1.5 L/day strict fluid restriction. Goal of care.  Comfort.   Increase activity slowly   Complete by: As directed    No wound care   Complete by: As directed        Discharge Medications   Allergies as of 05/31/2021   No Known Allergies      Medication List     STOP taking these medications    feeding supplement Liqd   furosemide 20 MG tablet Commonly known as: LASIX   mirtazapine 7.5 MG tablet Commonly known as: REMERON   potassium chloride SA 20 MEQ tablet Commonly known as: KLOR-CON       TAKE these medications    acetaminophen 500 MG tablet Commonly known as: TYLENOL Take 500 mg by mouth daily as needed for mild pain.   albuterol (2.5 MG/3ML) 0.083% nebulizer solution Commonly known as:  PROVENTIL TAKE 3ML BY MOUTH VIA NEBULIZER EVERY 4 HOURS AS NEEDED FOR WHEEZING OR SHORTNESS OF BREATH What changed:  how much to take how to take this when to take this reasons to take this additional instructions   albuterol 108 (90 Base) MCG/ACT inhaler Commonly known as: Ventolin HFA INHALE 2 PUFFS BY MOUTH EVERY 6 HOURS AS NEEDED FOR SHORTNESS OF BREATH What changed:  how much to take how to take this when to take this reasons to take this additional instructions   ascorbic acid 500 MG tablet Commonly known as: VITAMIN C Take 500 mg by mouth daily.   Ativan 0.5 MG tablet Generic drug: LORazepam Take 0.5 mg by mouth every 4 (four) hours as needed for anxiety.   cholecalciferol 25 MCG (1000 UNIT) tablet Commonly known as: VITAMIN D3 Take 1,000 Units by mouth daily.   fexofenadine 180 MG tablet Commonly known as: ALLEGRA Take 180 mg by mouth daily.   ondansetron 8 MG tablet Commonly known as: ZOFRAN Take 8 mg by mouth every 8 (eight) hours as needed for nausea/vomiting.   pantoprazole 20 MG tablet Commonly known as: PROTONIX Take 20 mg by mouth daily.   spironolactone 25 MG tablet Commonly known as: ALDACTONE Take 1 tablet (25 mg total) by mouth daily. What changed: how much to take   traMADol 50 MG tablet Commonly known as: ULTRAM Take 50 mg by mouth every 6 (six) hours as needed for pain.   Trelegy Ellipta 100-62.5-25 MCG/INH Aepb Generic drug: Fluticasone-Umeclidin-Vilant Inhale 1 puff into the lungs daily.         Follow-up Information     [facility].   Why: If symptoms worsen Contact information: [address] [place] [address] [phone]                 Major procedures and Radiology Reports - PLEASE review detailed and final reports thoroughly  -      DG Chest 2 View  Result Date: 05/25/2021 CLINICAL DATA:  Altered mental status. EXAM: CHEST - 2 VIEW COMPARISON:  Chest plain film, dated  January 05, 2021 FINDINGS: A stable linear and patchy opacity is seen within the mid left lung and left perihilar region. Mild left-sided volume loss is noted. An ill-defined nipple shadow is suspected overlying  the right lung base. There is no evidence of a pleural effusion or pneumothorax. The heart size and mediastinal contours are within normal limits. There is marked severity calcification of the aortic arch. A bone island is seen overlying the fourth right rib. The visualized skeletal structures are otherwise unremarkable. IMPRESSION: 1. Stable left perihilar/mid left lung opacity which may correlate to the findings consistent with recurrent disease within the left hilum seen on the prior nuclear medicine PET/CT (dated September 30, 2020). 2. No evidence of an acute infiltrate. Electronically Signed   By: Virgina Norfolk M.D.   On: 05/25/2021 23:22   CT Head Wo Contrast  Result Date: 05/25/2021 CLINICAL DATA:  Altered mental status. EXAM: CT HEAD WITHOUT CONTRAST TECHNIQUE: Contiguous axial images were obtained from the base of the skull through the vertex without intravenous contrast. COMPARISON:  January 05, 2021 FINDINGS: Brain: There is moderate severity cerebral atrophy with widening of the extra-axial spaces and ventricular dilatation. There are areas of decreased attenuation within the white matter tracts of the supratentorial brain, consistent with microvascular disease changes. Vascular: No hyperdense vessel or unexpected calcification. Skull: Normal. Negative for fracture or focal lesion. Sinuses/Orbits: No acute finding. Other: It should be noted study is there is difficult and subsequently limited secondary to extensive amount of patient motion. IMPRESSION: 1. Markedly limited study secondary to patient motion. 2. No acute intracranial abnormality. 3. Generalized cerebral atrophy. Electronically Signed   By: Virgina Norfolk M.D.   On: 05/25/2021 23:08   IR Paracentesis  Result Date:  05/30/2021 INDICATION: History of lung cancer, recurrent ascites. Request for therapeutic paracentesis up to 6 L. EXAM: ULTRASOUND GUIDED  PARACENTESIS MEDICATIONS: 10 mL 1% lidocaine COMPLICATIONS: None immediate. PROCEDURE: Informed written consent was obtained from the patient after a discussion of the risks, benefits and alternatives to treatment. A timeout was performed prior to the initiation of the procedure. Initial ultrasound scanning demonstrates a large amount of ascites within the right lower abdominal quadrant. The right lower abdomen was prepped and draped in the usual sterile fashion. 1% lidocaine was used for local anesthesia. Following this, a 19 gauge, 7-cm, Yueh catheter was introduced. An ultrasound image was saved for documentation purposes. The paracentesis was performed. The catheter was removed and a dressing was applied. The patient tolerated the procedure well without immediate post procedural complication. FINDINGS: A total of approximately 5.1 L of clear yellow fluid was removed. IMPRESSION: Successful ultrasound-guided paracentesis yielding 5.1 liters of peritoneal fluid. Read by: Durenda Guthrie, PA-C Electronically Signed   By: Corrie Mckusick D.O.   On: 05/30/2021 13:30      Today   Subjective    Ronald [name] today has no headache,no chest abdominal pain,no new weakness tingling or numbness, feels much better wants to go home today.     Objective   Blood pressure 97/66, pulse (!) 101, temperature 98.4 F (36.9 C), temperature source Oral, resp. rate 18, height 5\' 4"  (1.626 m), weight 60.8 kg, SpO2 100 %.   Intake/Output Summary (Last 24 hours) at 05/31/2021 1042 Last data filed at 05/31/2021 1002 Gross per 24 hour  Intake 970 ml  Output 600 ml  Net 370 ml    Exam  Awake Alert, No new F.N deficits, Normal affect [place].AT,PERRAL Supple Neck,No JVD, No cervical lymphadenopathy appriciated.  Symmetrical Chest wall movement, Good air movement bilaterally, CTAB RRR,No  Gallops,Rubs or new Murmurs, No Parasternal Heave +ve B.Sounds, Abd Soft, Non tender, mild ascites. No Cyanosis, Clubbing or edema, No new Rash  or bruise   Data Review   CBC w Diff:  Lab Results  Component Value Date   WBC 10.0 05/31/2021   HGB 8.7 (L) 05/31/2021   HGB 11.6 (L) 12/24/2020   HCT 23.4 (L) 05/31/2021   PLT 102 (L) 05/31/2021   PLT 225 12/24/2020   LYMPHOPCT 7 05/31/2021   MONOPCT 11 05/31/2021   EOSPCT 0 05/31/2021   BASOPCT 0 05/31/2021    CMP:  Lab Results  Component Value Date   NA 120 (L) 05/31/2021   K 3.9 05/31/2021   CL 90 (L) 05/31/2021   CO2 21 (L) 05/31/2021   BUN 6 (L) 05/31/2021   CREATININE 0.60 (L) 05/31/2021   CREATININE 0.85 03/29/2021   PROT 5.2 (L) 05/31/2021   ALBUMIN 2.3 (L) 05/31/2021   BILITOT 6.3 (H) 05/31/2021   BILITOT 0.4 12/24/2020   ALKPHOS 570 (H) 05/31/2021   AST 105 (H) 05/31/2021   AST 16 12/24/2020   ALT 55 (H) 05/31/2021   ALT 10 12/24/2020  .   Total Time in preparing paper work, data evaluation and todays exam - 36 minutes  Lala Lund M.D on 05/31/2021 at 10:42 AM  [facility]

## 2021-06-01 LAB — CULTURE, BLOOD (ROUTINE X 2)
Culture: NO GROWTH
Culture: NO GROWTH
Special Requests: ADEQUATE
Special Requests: ADEQUATE

## 2021-06-09 ENCOUNTER — Ambulatory Visit: Admitting: Gastroenterology

## 2021-06-21 ENCOUNTER — Encounter: Payer: Self-pay | Admitting: Oncology

## 2021-06-30 DEATH — deceased

## 2021-08-11 IMAGING — CR DG CHEST 2V
2 series · 2 of 2 positions shown · non-contrast
Comparison: PET CT 09/30/2020.  Chest x-ray 01/10/2019.

CLINICAL DATA: Shortness of breath.

EXAM:
CHEST - 2 VIEW

[x chest ap]
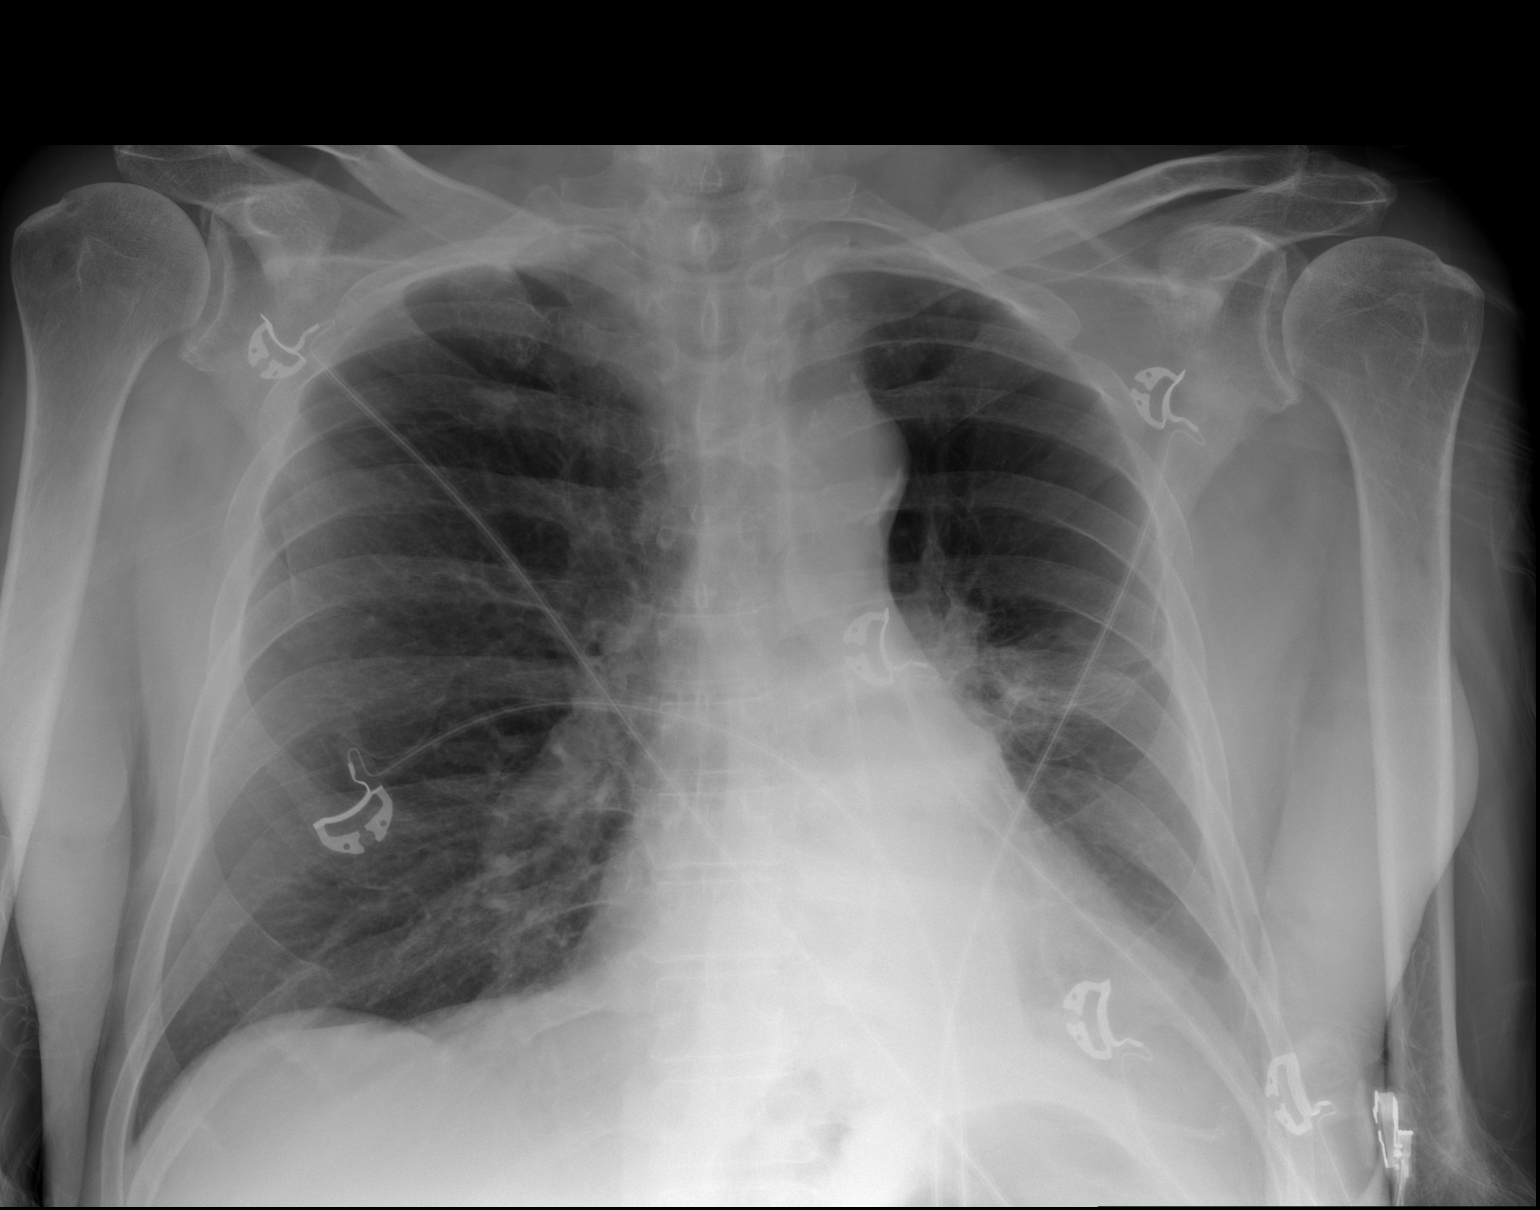

[w chest lat]
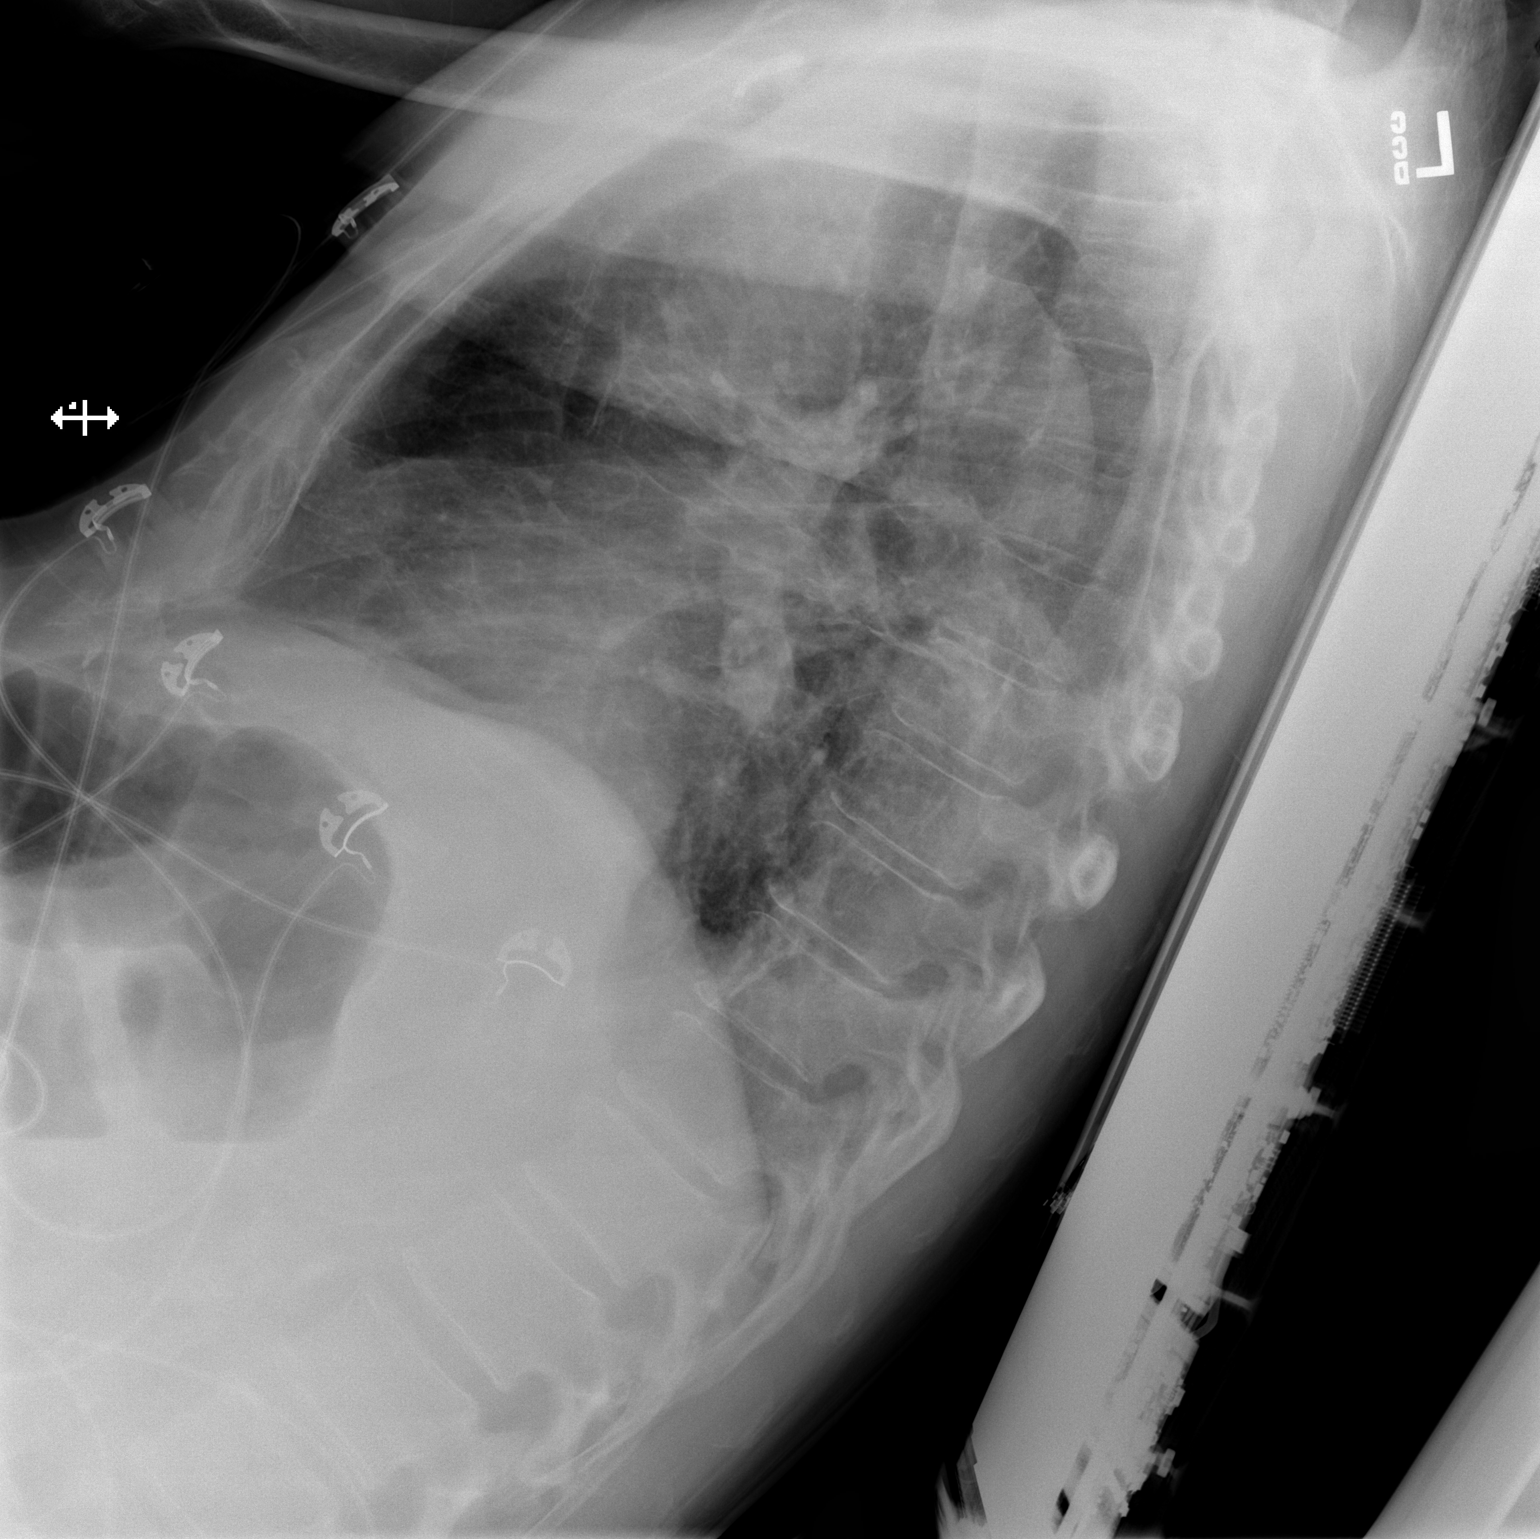

[2 of 2 positions shown; findings below may reference images not displayed]

FINDINGS: Mediastinum is stable. Persistent left hilar/perihilar density is
again noted. Persistent atelectasis left lung base. Small left
pleural effusion again noted. No pneumothorax. No acute bony
abnormality identified.
IMPRESSION: Persistent left hilar/perihilar density is again noted. Persistent
atelectasis left lung base. Small left pleural effusion again noted.
Similar findings noted on PET-CT. Reference is made to PET-CT of
09/30/2020. No new findings present.

## 2021-12-29 IMAGING — DX DG CHEST 2V
2 series · 3 of 3 positions shown · non-contrast
Comparison: Chest plain film, dated January 05, 2021

CLINICAL DATA: Altered mental status.

EXAM:
CHEST - 2 VIEW

[Series 2: chest lat · 0.14mm/px · 2 of 2 slices shown]
[im 1/2]
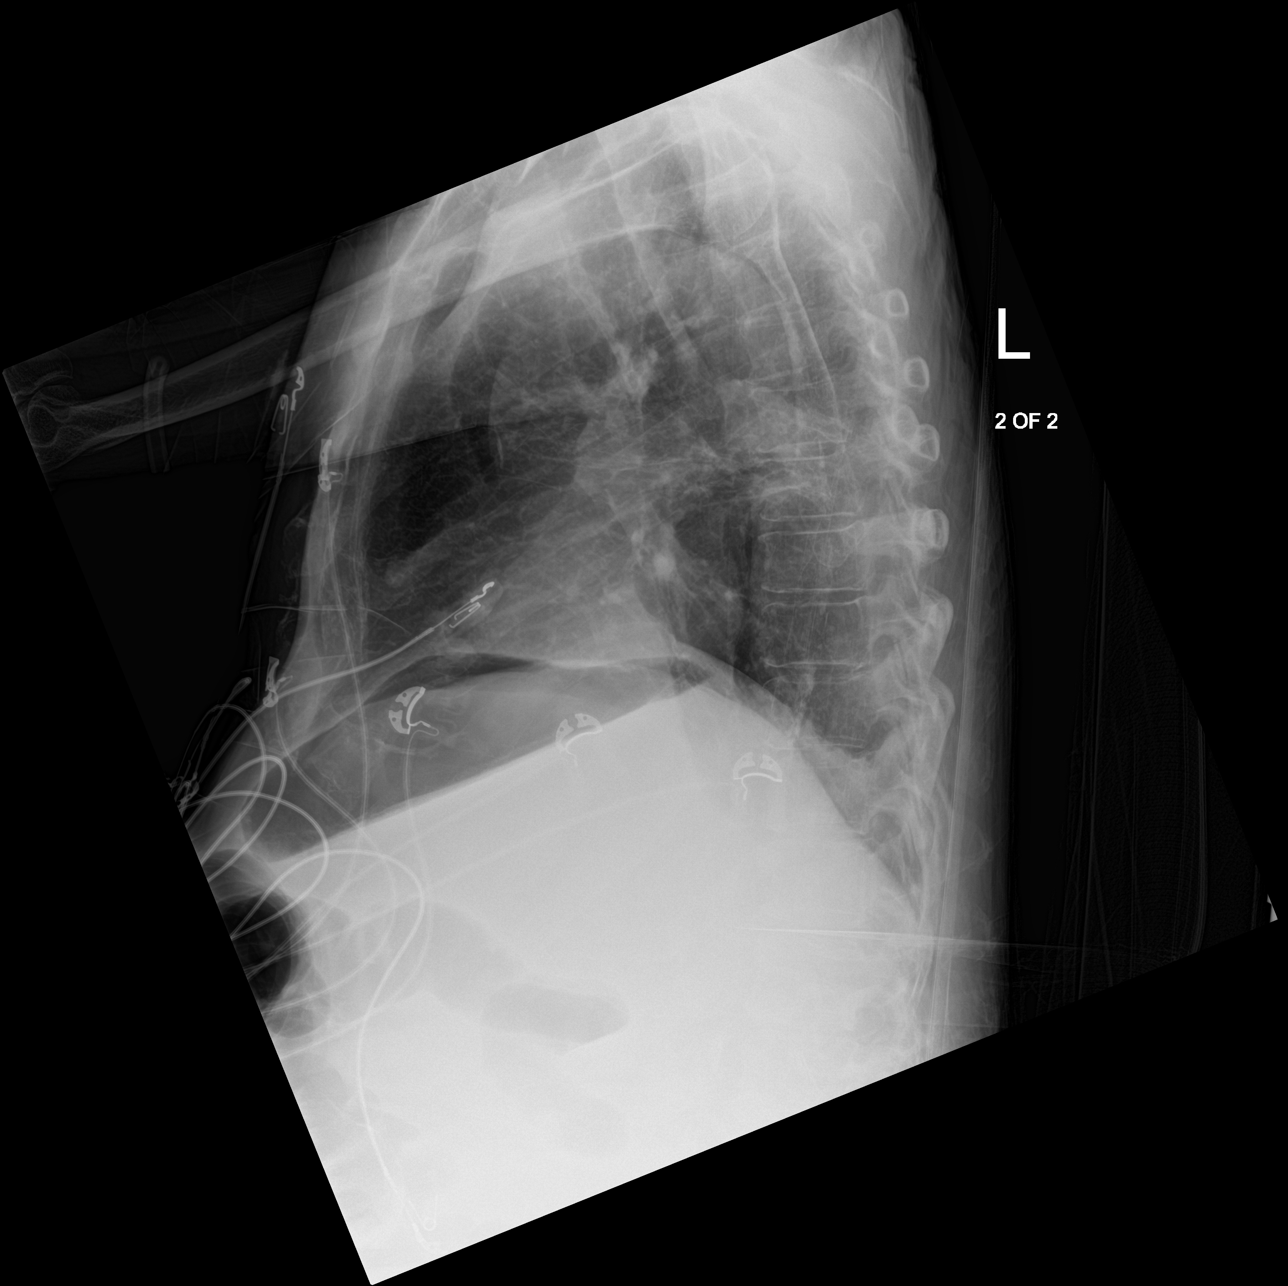
[im 2/2]
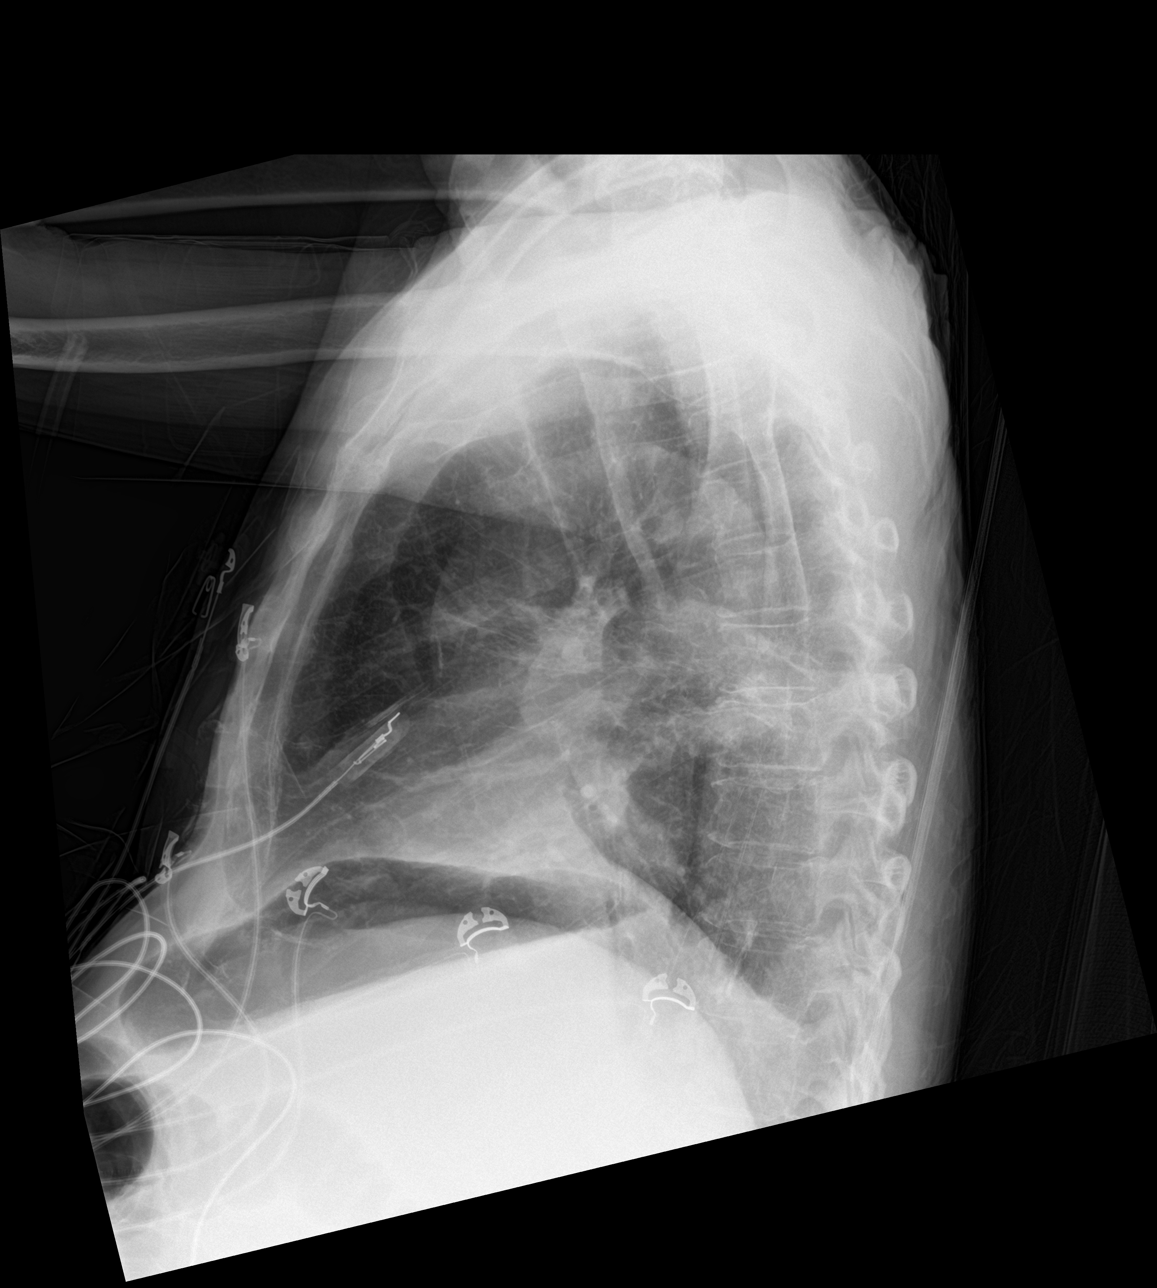

[chest ap]
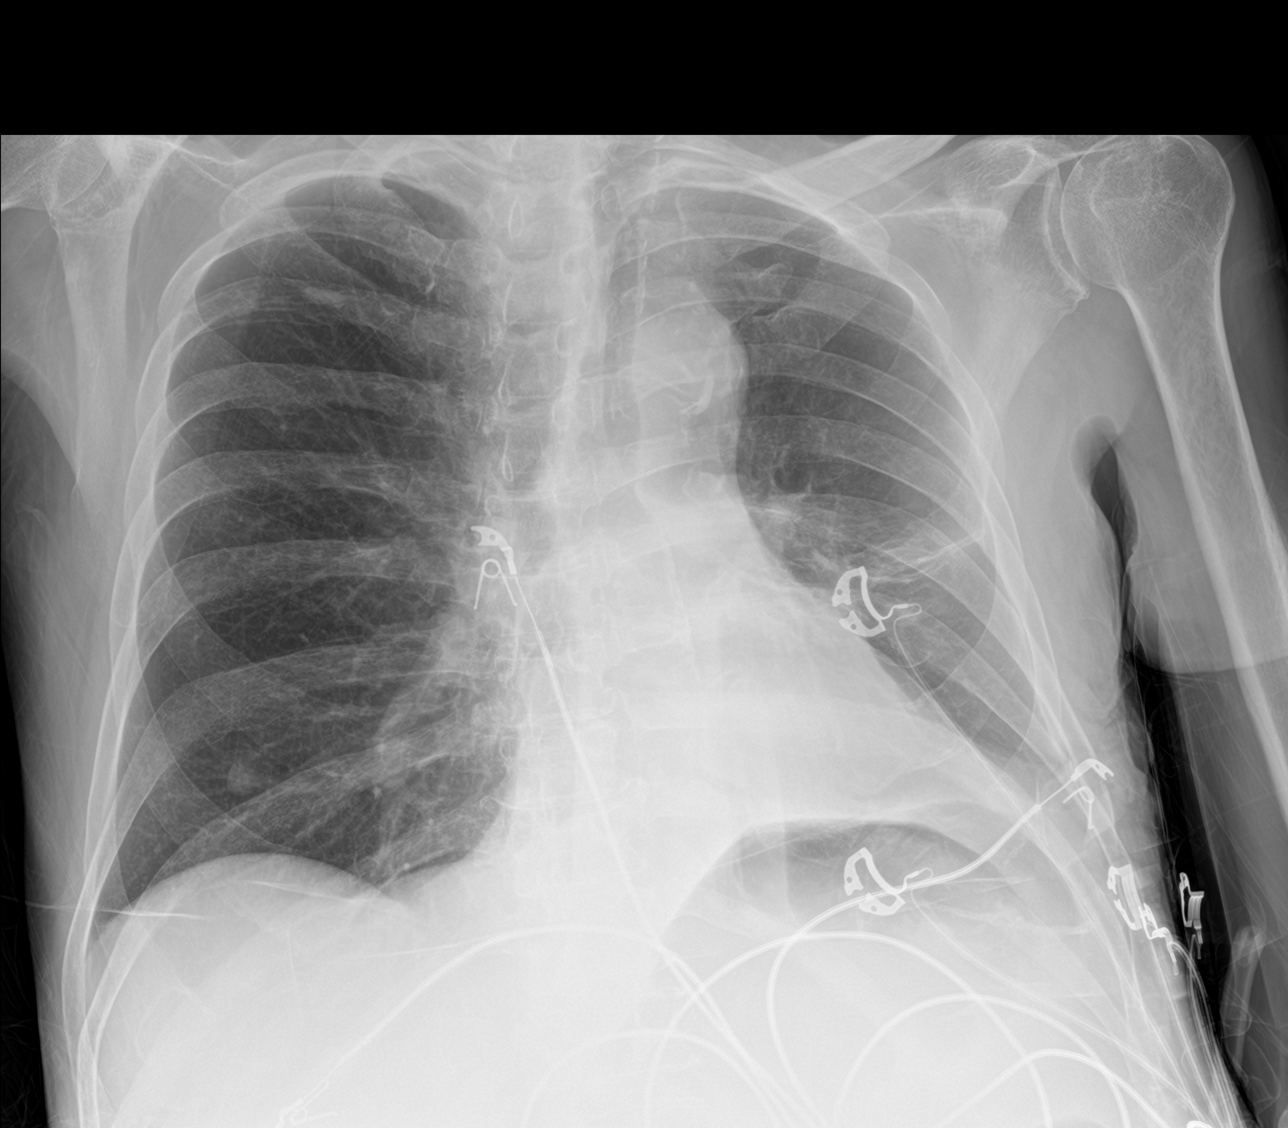

[3 of 3 positions shown; findings below may reference images not displayed]

FINDINGS: A stable linear and patchy opacity is seen within the mid left lung
and left perihilar region. Mild left-sided volume loss is noted. An
ill-defined nipple shadow is suspected overlying the right lung
base. There is no evidence of a pleural effusion or pneumothorax.
The heart size and mediastinal contours are within normal limits.
There is marked severity calcification of the aortic arch. A bone
island is seen overlying the fourth right rib. The visualized
skeletal structures are otherwise unremarkable.
IMPRESSION: 1. Stable left perihilar/mid left lung opacity which may correlate
to the findings consistent with recurrent disease within the left
hilum seen on the prior nuclear medicine PET/CT (dated [DATE]. No evidence of an acute infiltrate.

## 2022-01-03 IMAGING — US IR PARACENTESIS
1 series · 2 of 2 positions shown · non-contrast
Comparison: none

INDICATION: History of lung cancer, recurrent ascites. Request for therapeutic
paracentesis up to 6 L.

[Series 1: ir (id) (id)/(id)/(id) ir · 2 of 2 slices shown]
[im 1/2]
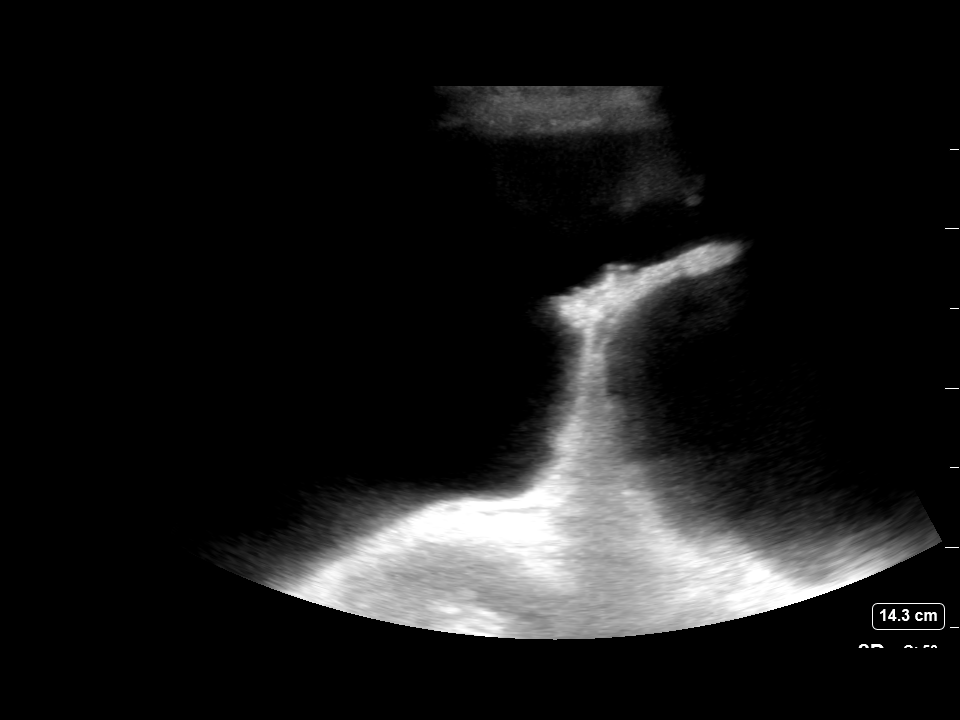
[im 2/2]
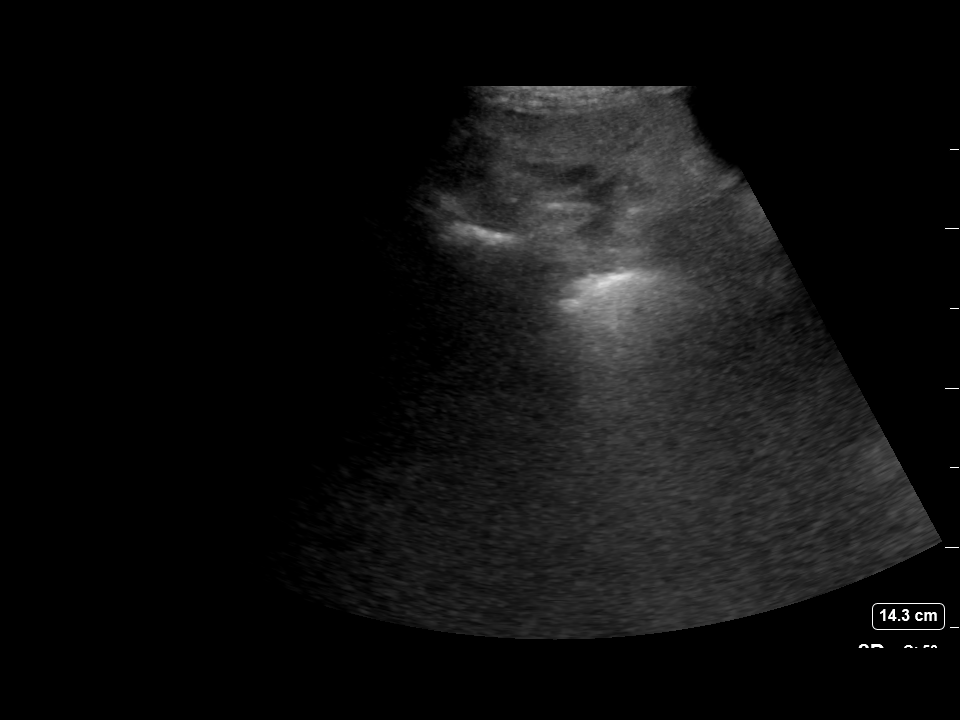

[2 of 2 positions shown; findings below may reference images not displayed]

EXAM:
ULTRASOUND GUIDED  PARACENTESIS

MEDICATIONS:
10 mL 1% lidocaine

COMPLICATIONS:
None immediate.

PROCEDURE:
Informed written consent was obtained from the patient after a
discussion of the risks, benefits and alternatives to treatment. A
timeout was performed prior to the initiation of the procedure.

Initial ultrasound scanning demonstrates a large amount of ascites
within the right lower abdominal quadrant. The right lower abdomen
was prepped and draped in the usual sterile fashion. 1% lidocaine
was used for local anesthesia.

Following this, a 19 gauge, 7-cm, Yueh catheter was introduced. An
ultrasound image was saved for documentation purposes. The
paracentesis was performed. The catheter was removed and a dressing
was applied. The patient tolerated the procedure well without
immediate post procedural complication.
FINDINGS: A total of approximately 5.1 L of clear yellow fluid was removed.
IMPRESSION: Successful ultrasound-guided paracentesis yielding 5.1 liters of
peritoneal fluid.
